# Patient Record
Sex: Female | Born: 1965 | Race: White | Hispanic: No | Marital: Married | Smoking: Never smoker
Health system: Southern US, Community
[De-identification: ages and names within clinical notes are randomized; demographics above are authoritative.]

## PROBLEM LIST (undated history)

## (undated) DIAGNOSIS — E559 Vitamin D deficiency, unspecified: Secondary | ICD-10-CM

## (undated) DIAGNOSIS — R0602 Shortness of breath: Secondary | ICD-10-CM

## (undated) DIAGNOSIS — N809 Endometriosis, unspecified: Secondary | ICD-10-CM

## (undated) DIAGNOSIS — R7989 Other specified abnormal findings of blood chemistry: Secondary | ICD-10-CM

## (undated) DIAGNOSIS — F329 Major depressive disorder, single episode, unspecified: Secondary | ICD-10-CM

## (undated) DIAGNOSIS — Z923 Personal history of irradiation: Secondary | ICD-10-CM

## (undated) DIAGNOSIS — F419 Anxiety disorder, unspecified: Secondary | ICD-10-CM

## (undated) DIAGNOSIS — F32A Depression, unspecified: Secondary | ICD-10-CM

## (undated) DIAGNOSIS — Z9221 Personal history of antineoplastic chemotherapy: Secondary | ICD-10-CM

## (undated) DIAGNOSIS — E785 Hyperlipidemia, unspecified: Secondary | ICD-10-CM

## (undated) DIAGNOSIS — Z8042 Family history of malignant neoplasm of prostate: Secondary | ICD-10-CM

## (undated) DIAGNOSIS — C50919 Malignant neoplasm of unspecified site of unspecified female breast: Secondary | ICD-10-CM

## (undated) DIAGNOSIS — Z803 Family history of malignant neoplasm of breast: Secondary | ICD-10-CM

## (undated) DIAGNOSIS — L900 Lichen sclerosus et atrophicus: Secondary | ICD-10-CM

## (undated) DIAGNOSIS — Z800 Family history of malignant neoplasm of digestive organs: Secondary | ICD-10-CM

## (undated) HISTORY — DX: Malignant neoplasm of unspecified site of unspecified female breast: C50.919

## (undated) HISTORY — DX: Vitamin D deficiency, unspecified: E55.9

## (undated) HISTORY — DX: Shortness of breath: R06.02

## (undated) HISTORY — DX: Lichen sclerosus et atrophicus: L90.0

## (undated) HISTORY — DX: Hyperlipidemia, unspecified: E78.5

## (undated) HISTORY — DX: Anxiety disorder, unspecified: F41.9

## (undated) HISTORY — DX: Depression, unspecified: F32.A

## (undated) HISTORY — DX: Family history of malignant neoplasm of breast: Z80.3

## (undated) HISTORY — DX: Major depressive disorder, single episode, unspecified: F32.9

## (undated) HISTORY — DX: Endometriosis, unspecified: N80.9

## (undated) HISTORY — DX: Other specified abnormal findings of blood chemistry: R79.89

## (undated) HISTORY — DX: Family history of malignant neoplasm of digestive organs: Z80.0

## (undated) HISTORY — DX: Family history of malignant neoplasm of prostate: Z80.42

---
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
## 1996-04-01 HISTORY — PX: OTHER SURGICAL HISTORY: SHX169

## 2002-04-01 HISTORY — PX: PELVIC LAPAROSCOPY: SHX162

## 2002-10-02 ENCOUNTER — Inpatient Hospital Stay (HOSPITAL_COMMUNITY): Admission: AD | Admit: 2002-10-02 | Discharge: 2002-10-02 | Payer: Self-pay | Admitting: Obstetrics and Gynecology

## 2002-12-08 ENCOUNTER — Other Ambulatory Visit: Admission: RE | Admit: 2002-12-08 | Discharge: 2002-12-08 | Payer: Self-pay | Admitting: Obstetrics and Gynecology

## 2003-04-02 HISTORY — PX: APPENDECTOMY: SHX54

## 2003-04-30 ENCOUNTER — Inpatient Hospital Stay (HOSPITAL_COMMUNITY): Admission: AD | Admit: 2003-04-30 | Discharge: 2003-05-02 | Payer: Self-pay | Admitting: Obstetrics and Gynecology

## 2003-05-03 ENCOUNTER — Encounter: Admission: RE | Admit: 2003-05-03 | Discharge: 2003-06-02 | Payer: Self-pay | Admitting: Obstetrics and Gynecology

## 2003-09-15 ENCOUNTER — Ambulatory Visit (HOSPITAL_COMMUNITY): Admission: RE | Admit: 2003-09-15 | Discharge: 2003-09-15 | Payer: Self-pay | Admitting: Family Medicine

## 2003-09-16 ENCOUNTER — Inpatient Hospital Stay (HOSPITAL_COMMUNITY): Admission: EM | Admit: 2003-09-16 | Discharge: 2003-09-18 | Payer: Self-pay | Admitting: Emergency Medicine

## 2003-09-16 ENCOUNTER — Encounter (INDEPENDENT_AMBULATORY_CARE_PROVIDER_SITE_OTHER): Payer: Self-pay | Admitting: Specialist

## 2003-12-28 ENCOUNTER — Other Ambulatory Visit: Admission: RE | Admit: 2003-12-28 | Discharge: 2003-12-28 | Payer: Self-pay | Admitting: Obstetrics and Gynecology

## 2004-05-18 ENCOUNTER — Other Ambulatory Visit: Admission: RE | Admit: 2004-05-18 | Discharge: 2004-05-18 | Payer: Self-pay | Admitting: Obstetrics and Gynecology

## 2005-06-05 ENCOUNTER — Other Ambulatory Visit: Admission: RE | Admit: 2005-06-05 | Discharge: 2005-06-05 | Payer: Self-pay | Admitting: Obstetrics and Gynecology

## 2006-01-20 ENCOUNTER — Inpatient Hospital Stay (HOSPITAL_COMMUNITY): Admission: AD | Admit: 2006-01-20 | Discharge: 2006-01-23 | Payer: Self-pay | Admitting: Obstetrics and Gynecology

## 2006-07-24 ENCOUNTER — Ambulatory Visit: Payer: Self-pay | Admitting: Cardiology

## 2010-03-20 ENCOUNTER — Encounter
Admission: RE | Admit: 2010-03-20 | Discharge: 2010-03-20 | Payer: Self-pay | Source: Home / Self Care | Attending: Obstetrics and Gynecology | Admitting: Obstetrics and Gynecology

## 2010-08-17 NOTE — Discharge Summary (Signed)
[name], [name]                ACCOUNT NO.:  [id number]   MEDICAL RECORD NO.:  [id number]          PATIENT TYPE:  INP   LOCATION:                                FAC[facility]  [facility]   PHYSICIAN:  Sandra [name], M.D.        DATE OF BIRTH:   DATE OF ADMISSION:  01/20/2006  DATE OF DISCHARGE:  01/23/2006                               DISCHARGE SUMMARY   FINAL DIAGNOSES:  1. Intrauterine pregnancy at [redacted] weeks gestation.  2. History of prior minor shoulder dystocia and suspected fetal      macrosomia.   PROCEDURE:  Primary low segment transverse cesarean section.   SURGEON:  Sandra [name], M.D.   ASSISTANT:  Freddrick March. Tenny Craw, MD.   COMPLICATIONS:  None.   HOSPITAL COURSE:  This 45 year old G2 P1 presents for a primary cesarean  section secondary to a history of a minor shoulder dystocia and a  ultrasound which showed a suspected macrosomic infant.  The patient's  antepartum course up to this point had been uncomplicated.  The patient  did have an abnormal 1-hour sugar test but passed her 3-hour sugar test.  The patient also is advanced maternal age but declined any type of fetal  screening.  The patient did have a negative group B strep culture  obtained in the office at 35 weeks.  The patient was taken to the  operating room on January 20, 2006, where a primary low segment  transverse cesarean section was performed with the delivery of an 8  pound 15 ounce female infant with Apgars of 8 and 9.  The delivery went  without complications.   The patient's postoperative course was benign without any significant  fevers.  She was felt ready for discharge on postoperative day #3.  She  did not need any RhoGAM before discharge.  Her husband is also Rh  negative.  She was sent home on a regular diet, told to decrease  activities, told to continue her prenatal vitamins, was given Percocet  one to two every 4 hours as needed for pain, told she could use over-the-  counter ibuprofen up to 600  mg every 6 hours as needed for pain, was to  follow up in our office in 4 weeks.   LABS ON DISCHARGE:  The patient had a hemoglobin of 10.9, white blood  cell count of 10.6 and platelets of 174,000.     Sandra [name], P.A.-C.      Sandra [name], M.D.  Electronically Signed   MB/MEDQ  D:  03/10/2006  T:  03/10/2006  Job:  [id number]

## 2010-08-17 NOTE — Op Note (Signed)
Sandra [name], Sandra [name]                          ACCOUNT NO.:  [id number]   MEDICAL RECORD NO.:  [id number]                   PATIENT TYPE:  INP   LOCATION:  5707                                 FACILITY:  [facility]   PHYSICIAN:  Adolph Pollack, M.D.            DATE OF BIRTH:  Aug 22, 1965   DATE OF PROCEDURE:  09/16/2003  DATE OF DISCHARGE:                                 OPERATIVE REPORT   PREOPERATIVE DIAGNOSIS:  Acute appendicitis.   POSTOPERATIVE DIAGNOSIS:  Ruptured appendicitis.   PROCEDURE:  Laparoscopic appendectomy.   SURGEON:  Adolph Pollack, M.D.   ANESTHESIA:  General.   INDICATIONS FOR PROCEDURE:  This is a 45 year old female who presented to  the emergency department from her primary care physician's office.  She had  a CT scan which was consistent with appendicitis.  Her clinical course was  consistent, as well.  She now presents to the operating room.   SURGICAL TECHNIQUE:  She was placed supine on the operating table and a  general anesthetic was administered.  PAS hose were placed and a Foley  catheter was inserted to the bladder.  The abdominal wall was sterilely  prepped and draped.  Local anesthetic was infiltrated in the subumbilical  region and the previous subumbilical incision was incised and the incision  carried down through the subcutaneous tissue.  A small incision was made in  the fascia and peritoneum.  The peritoneal cavity was entered under direct  vision.  A purse-string suture of 0 Vicryl was placed around the fascial  edges.  A Hasson trocar was introduced into the peritoneal cavity and a  pneumoperitoneum was created by insufflation of CO2 gas.  The laparoscope  was introduced.  A 5 mm trocar was placed in the lower midline in the  suprapubic region and one also in the right upper quadrant.  An inflamed  appendix was identified with a focal site of perforation and some purulent  fluid in the pelvis.  I mobilized the appendix bluntly and  then was able to  retract it anteriorly.  There was a small perforation noted in the mid  portion of the appendix.  The mesoappendix was divided with the Harmonic  scalpel down to the base of the cecum.  The appendix was then amputated off  the cecum with the endo-GIA stapler.  It was placed in the endopouch bag and  removed through the subumbilical port.  The Hasson trocar was placed through  the subumbilical incision.  3 liters of saline were used to copiously  irrigate out the abdominal cavity.  The staple line was solid without  evidence of leak or bleeding.  The majority of the irrigation fluid was  evacuated.  Under direct vision, the suprapubic 5 mm trocar was removed and  the Hasson trocar removed and the fascial defect closed by tightening up and  tightening down the purse-string suture.  The remaining 5  mm trocars were  removed along with the release of the pneumoperitoneum.  The skin incisions  were closed with 4-0 Monocryl subcuticular stitch.  Steri-Strips and sterile  dressings were applied.  She tolerated the procedure well without any  apparent complication.  We will keep her on IV antibiotics and she could  have a prolonged ileus given the focal perforation.  She is taken to the  recovery room in satisfactory condition.                                               Adolph Pollack, M.D.    [name]  D:  09/16/2003  T:  09/16/2003  Job:  [id number]

## 2010-08-17 NOTE — Op Note (Signed)
[name], [name]                ACCOUNT NO.:  [id number]   MEDICAL RECORD NO.:  [id number]          PATIENT TYPE:  INP   LOCATION:  9113                          FACILITY:  [facility]   PHYSICIAN:  Randye Lobo, M.D.   DATE OF BIRTH:  Nov 25, 1965   DATE OF PROCEDURE:  01/20/2006  DATE OF DISCHARGE:                                 OPERATIVE REPORT   PREOPERATIVE DIAGNOSES:  1. Intrauterine gestation at 39 weeks.  2. Suspected fetal macrosomia.  3. History of minor shoulder dystocia.   POSTOPERATIVE DIAGNOSES:  1. Intrauterine gestation at 39 weeks.  2. Suspected fetal macrosomia.  3. History of prior minor shoulder dystocia.   PROCEDURE:  Primary low segment transverse cesarean section.   SURGEON:  Conley Simmonds, MD   ASSISTANT:  Waynard Reeds, MD   ANESTHESIA:  Spinal.   IV FLUIDS:  5000 mL Ringer's lactate.   ESTIMATED BLOOD LOSS:  1000 mL   URINE OUTPUT:  300 mL   COMPLICATIONS:  None.   INDICATIONS FOR PROCEDURE:  The patient is a 40-year gravida 2, para 1  Caucasian female with a history of prior vaginal delivery with a minor  shoulder dystocia with a fetal weight of 9 pounds 4 ounces, who on recent  ultrasound was noted to have in an estimated fetal weight at term which  surpassed the 9 pounds 4 ounces mark.  The patient had an elevated 1-hour  glucose tolerance test during the pregnancy and a normal 3 hours on glucose  tolerance test.  The patient was counseled regarding her options and a plan  was made to proceed with a primary cesarean section after risks, benefits,  and alternatives were reviewed.   FINDINGS:  A viable female was delivered at 9:31 a.m. with a Apgars of the 8  and one minute and 9 at five minutes.  The newborn weight was 8 pounds 15  ounces.  The uterus, tubes and ovaries were unremarkable.  The placenta did  have a marginal insertion of a three-vessel cord.   SPECIMENS:  None.   PROCEDURE:  The patient was reidentified in the preoperative hold  area.  The  patient was taken to the operating room where spinal anesthetic was  administered.  The patient was then placed in the supine position with a  left lateral tilt.  The abdomen was sterilely prepped and a Foley catheter  was placed inside the bladder.  The patient was then sterilely draped.   Pfannenstiel incision was created sharply with the scalpel and this was  carried down to the fascia using the scalpel.  The monopolar cautery was  then used to create hemostasis in the cutaneous layer.  The fascial incision  was extended bilaterally with the Mayo scissors.  Rectus muscles were  separated from the fascia superiorly and inferiorly.  Rectus muscles were  sharply divided in the midline.  The parietal peritoneum was elevated with  two hemostat clamps and was entered sharply.  The peritoneal incision was  extended cranially and caudally.   The lower uterine segment was exposed and the bladder flap was  created by  sharp dissection.  The bladder itself was noted to be quite low and the area  in this region was not very vascular.  The transverse lower uterine segment  incision was created sharply with the scalpel and was created well above the  vascular area of the lower uterine segment.  The uterine cavity was  ultimately entered bluntly with a Kelly clamp.  The uterine incision was  extended bluntly.  Membranes were ruptured with an Allis.  The vertex was  elevated to the uterine incision and the Mityvac was used with proper  pressure over one effort for delivery of the vertex without difficulty.  The  nares and mouth were suctioned.  The remainder of the newborn was then  delivered.  The cord was doubly clamped and cut and the newborn was carried  over to the waiting pediatricians in vigorous condition.   Cord blood was obtained at this time and cord blood was also donated.  The  placenta was then manually extracted and was sent to labor and delivery.   The uterus was  exteriorized at this time.  A moistened lap pad was used to  remove any remaining products of conception from within the uterine cavity.  The patient received Ancef 1 gram IV at cord clamp and then she received  Pitocin 20 units IV after removal of the placenta.  The uterine incision was  closed at this time with a double layer closure of #1 chromic.  The first  was a running locked layer and the second was an imbricating layer.  There  was some additional bleeding along the superior midportion of the incision  and this responded to a figure-of-eight suture of 3-0 Vicryl.   The uterus was returned to the peritoneal cavity which was irrigated and  suctioned at this time.  There was some additional bleeding of the serosa  along the outright apex of the incision and this responded to a figure-of-  eight suture of 3-0 Vicryl.   The abdomen was closed at this time.  The peritoneum was closed with a  running suture of 2-0 Vicryl.  The rectus muscles were reapproximated with  interrupted sutures of #1 chromic.  The fascia was closed with a running  suture of 0 Vicryl.  The subcutaneous tissue was irrigated and suctioned and  made hemostatic with monopolar cautery.  Interrupted sutures of 3-0 plain  were used to close the  subcutaneous layer and the skin was closed with  staples.  A sterile bandage was placed over the incision.   This concluded the patient's procedure.  There were no complications.  All  needle, instrument, and counts were correct.      Randye Lobo, M.D.  Electronically Signed    BES/MEDQ  D:  01/21/2006  T:  01/22/2006  Job:  [id number]

## 2010-08-17 NOTE — H&P (Signed)
NAMELUCAS, Sandra [name]                          ACCOUNT NO.:  [id number]   MEDICAL RECORD NO.:  [id number]                   PATIENT TYPE:  INP   LOCATION:  5707                                 FACILITY:  [facility]   PHYSICIAN:  Adolph Pollack, M.D.            DATE OF BIRTH:  Jan 10, 1966   DATE OF ADMISSION:  09/16/2003  DATE OF DISCHARGE:                                HISTORY & PHYSICAL   CHIEF COMPLAINT:  Abdominal pain.   HISTORY OF PRESENT ILLNESS:  This is a 45 year old female who, on Wednesday,  awoke and felt poorly.  She has had some abdominal bloating, some nausea.  On Wednesday night, she began having progressively increasing abdominal pain  along with the nausea.  She then developed some intermittent fever, was seen  by her primary care physician and reevaluated Thursday afternoon, where the  pain then migrated to the right lower quadrant.  She was sent to [facility], where she underwent a CT scan as an outpatient and this  demonstrated acute appendicitis.  She was then brought to the emergency  department and I was asked to see her.  She had no urinary symptoms.  The  pain is increasing in severity.   PAST MEDICAL HISTORY:  No chronic illnesses.   PREVIOUS OPERATIONS:  Laparoscopy.   ALLERGIES:  None.   MEDICATIONS:  Prenatal vitamins.   SOCIAL HISTORY:  She is married.  No tobacco or alcohol use.  She has a baby  daughter.   FAMILY HISTORY:  The family history is positive for hypertension.   REVIEW OF SYSTEMS:  No known heart disease or hypertension.  PULMONARY:  No  asthma, pneumonia, COPD, tuberculosis.  GI:  No peptic ulcer disease,  hepatitis, colitis.  GU:  No kidney stones, urinary tract infections.  ENDOCRINE:  No diabetes, thyroid disease or hypercholesterolemia.  NEUROLOGIC:  No strokes or seizures.  HEMATOLOGIC:  No bleeding disorders or  DVT.   PHYSICAL EXAM:  GENERAL:  Generally, an ill-appearing female and  uncomfortable.  VITAL SIGNS:   Temperature was 98.7 on arrival, pulse 95, blood pressure  107/68.  EYES:  Extraocular motions intact.  No icterus noted.  NECK:  Neck supple without masses or palpable thyroid enlargement.  RESPIRATORY:  Breath sounds equal and clear.  Respirations unlabored.  CARDIOVASCULAR:  Cardiovascular demonstrates an increased rate with a  regular rhythm and no murmur heard.  ABDOMEN:  Her abdomen is soft with right lower quadrant tenderness and  guarding to both palpation and percussion.  No palpable masses.  Small  subumbilical scar.  Hypoactive bowel sounds noted.  EXTREMITIES:  Good muscle tone, full range of motion.   LABORATORY DATA:  White cell count 17,300.  Urine pregnancy test negative.   CT scan reviewed and was consistent with acute appendicitis.   IMPRESSION:  Acute appendicitis.   PLAN:  Laparoscopic appendectomy.  I did explain the procedure and  the risks  to her.  The risks include, but not limited to, bleeding, infection, risks  of anesthesia, accidental damage to intra-abdominal organs such as bladder,  intestines, etc, and potential prolonged postoperative course if there were  a ruptured appendix.                                                Adolph Pollack, M.D.    [name]  D:  09/16/2003  T:  09/17/2003  Job:  [id number]

## 2010-08-17 NOTE — Op Note (Signed)
Sandra [name], Sandra [name]                          ACCOUNT NO.:  [id number]   MEDICAL RECORD NO.:  [id number]                   PATIENT TYPE:  INP   LOCATION:  9165                                 FACILITY:  [facility]   PHYSICIAN:  Randye Lobo, M.D.                DATE OF BIRTH:  Feb 10, 1966   DATE OF PROCEDURE:  04/30/2003  DATE OF DISCHARGE:                                 OPERATIVE REPORT   PREOPERATIVE DIAGNOSES:  1. Intrauterine gestation at 40 +6 weeks.  2. Maternal exhaustion.   POSTOPERATIVE DIAGNOSES:  1. Intrauterine gestation at 40 +6 weeks.  2. Maternal exhaustion.  3. Mild shoulder dystocia.   PROCEDURE:  Vacuum-assisted vaginal delivery with midline episiotomy and  repair.   SURGEON:  Randye Lobo, M.D.   ANESTHESIA:  Epidural, local with 1% lidocaine.   ESTIMATED BLOOD LOSS:  400 mL.   COMPLICATIONS:  None.   INDICATIONS FOR THE PROCEDURE:  The patient is a 45 year old gravida 1, para  0, Caucasian female who presented at 81 +[redacted] weeks gestation on 04/30/03,  reporting contractions every 5 minutes since 23:00 on 04/29/03.  The patient  had been seen in the office on 04/28/03 and had a reactive nonstress test, at  which time her cervix was noted to be 1 cm dilated and 50% effaced.  Upon  admission to the [facility], the patient's cervix was 4-5 cm dilated,  90% effaced, with a vertex at -2 station.  Membranes were intact.  The fetal  heart rate tracing was noted to be reactive, and the patient was having  regular contractions.  The patient went on to receive an epidural for  anesthesia.  Artificial rupture of membranes was performed, and clear fluid  was noted.  The patient's contractions began to become less frequent, and  Pitocin augmentation was performed.  The patient went onto to complete  cervical dilation and began pushing.  After pushing for 1-1/2 hours, the  patient developed exhaustion, and there was edema of the vagina and the  hymen, with a  spontaneous hymenal laceration which developed.  At this time,  a recommendation was made to proceed with a vacuum-assisted vaginal delivery  with episiotomy by the obstetrician.  The risks and benefits were reviewed.   FINDINGS:  A viable female was delivered at 13:10 in the left occiput-  anterior position.  Apgars were 8 at 1 minute and 9 at 5 minutes.  The  newborn was noted to be vigorous.  The umbilical cord had a normal  configuration and insertion of a three-vessel cord.  Examination of the  vagina documented a third-degree extension of the midline episiotomy.  The  cervix demonstrated no lacerations, and there were right and left labial  separations, the left of which extended into the hymen and the vagina.  The  newborn was noted to have a temperature of 101.8 rectally and then 101.6  axillary.  Maternal temperature just prior to delivery was noted to be 99.7.   PROCEDURE:  The patient was examined and found to bring the vertex down to  the 3+ station in the left occiput-anterior position.  The patient was  sterilely prepped and draped, and the bladder was catheterized of urine.  A  local 1% lidocaine was injected, and a midline episiotomy was performed.  The Mighty Vac was applied to the vertex and was used over one contraction,  at which time the fetal vertex was delivered.  There was a very mild  shoulder dystocia, which was encountered, which lasted for a few seconds and  was relieved easily with McRobert's maneuver.  The newborn was noted to be  vigorous and was placed on the maternal abdomen and chest.  There was  spontaneous delivery of the placenta.  The vagina, cervix, and vulva were  examined, and the findings are as noted above.  The midline episiotomy was  repaired in standard fashion with a combination of 0 and 2-0 Vicryl.  The  left labial abrasion was repaired with interrupted sutures of 3-0 Vicryl.   The patient did receive Pitocin 20 units intravenously.    There were no complications to the procedure.  Needle, instrument, and  sponge counts were correct.                                               Randye Lobo, M.D.    BES/MEDQ  D:  04/30/2003  T:  04/30/2003  Job:  [id number]

## 2010-08-17 NOTE — Assessment & Plan Note (Signed)
[facility]                            CARDIOLOGY OFFICE NOTE   NAME:Sandra [name]                       MRN:          [id number]  DATE:07/24/2006                            DOB:          1966/03/13    [name] is a delightful 45 year old married white female,  mother of two girls, who comes in today for consultation for  cardiovascular risk assessment.  She has risk factors of hyperlipidemia  with a total cholesterol of 206, LDL of 142.  She does not have the  value of her HDL triglycerides.  She has a premature family history of  stroke with her mother having a stroke at age 53 and subsequently dying  at age 24 of a heart attack; however, her mother did smoke.  We do not  know any of the risk factors of her mother.   [name] is healthy otherwise.  She has not smoked, does not drink,  does not use any recreational drugs.  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   PAST MEDICAL HISTORY:  She has no history of diabetes or hypertension.   CURRENT MEDICATIONS:  Mirena IUD.   PAST SURGICAL HISTORY:  1. Lower mandible surgery in June, 1985.  2. Appendectomy in June, 2005.  3. C-section in October, 2007.  4. IUD in December, 2007.   FAMILY HISTORY:  As above.   SOCIAL HISTORY:  She is a Fut[employer] trader.   REVIEW OF SYSTEMS:  Other than the HPI, negative for all systems  reviewed.   PHYSICAL EXAMINATION:  GENERAL:  Very pleasant.  She is in no acute  distress.  VITAL SIGNS:  Blood pressure 125/76.  Pulse is 81 and regular.  Her  weight is 205.  She is 5 feet 10-1/2.  HEENT:  Normocephalic and atraumatic.  PERRLA.  Extraocular movements  are intact.  She wears glasses.  Sclerae are clear.  Facial symmetry is  normal.  Dentition is satisfactory.  NECK:  Supple without JVD, thyromegaly.  Carotid upstrokes are equal  bilaterally without bruits.  LUNGS:  Clear to auscultation.  HEART:  A  nondisplaced PMI.  She has normal S1 and S2 without murmur,  rub or gallop.  ABDOMEN:  Soft with good bowel sounds.  No midline bruit.  EXTREMITIES:  No clubbing, cyanosis or edema.  Pulses are intact.   Her electrocardiogram is completely normal.   ASSESSMENT/PLAN:  I have had about a 20 minute discussion with Ms.  Sandra [name].  Because of her young age, her cardiovascular risks are very  low over the next 10 years of having an acute vascular event; however,  as I point out to her today, as she gets older, this risk will  precipitate fairly quickly.  I have strongly recommended the following:  1. Try to decrease her weight and certainly gain no more.  2. Vigorous walking or some sort of cardiac activity three hours per      week.  3. Much healthier diet.  She readily admits to being a  junk food      junkie.  We talked about this at length.  4. No indication for pharmacological therapy of her hyperlipidemia at      present.     Thomas C. Daleen Squibb, MD, [facility]  Electronically Signed    TCW/MedQ  DD: 07/24/2006  DT: 07/24/2006  Job #: [id number]   cc:   Randye Lobo, M.D.

## 2010-08-21 ENCOUNTER — Other Ambulatory Visit: Payer: Self-pay | Admitting: Obstetrics and Gynecology

## 2010-08-21 DIAGNOSIS — Z1231 Encounter for screening mammogram for malignant neoplasm of breast: Secondary | ICD-10-CM

## 2010-09-24 ENCOUNTER — Ambulatory Visit
Admission: RE | Admit: 2010-09-24 | Discharge: 2010-09-24 | Disposition: A | Discharge status: Home / Self Care | Payer: 59 | Source: Ambulatory Visit | Attending: Obstetrics and Gynecology | Admitting: Obstetrics and Gynecology

## 2010-09-24 DIAGNOSIS — Z1231 Encounter for screening mammogram for malignant neoplasm of breast: Secondary | ICD-10-CM

## 2010-11-29 ENCOUNTER — Other Ambulatory Visit: Payer: Self-pay | Admitting: Gastroenterology

## 2011-08-27 ENCOUNTER — Other Ambulatory Visit: Payer: Self-pay | Admitting: Family Medicine

## 2011-08-27 DIAGNOSIS — Z1231 Encounter for screening mammogram for malignant neoplasm of breast: Secondary | ICD-10-CM

## 2011-09-25 ENCOUNTER — Ambulatory Visit
Admission: RE | Admit: 2011-09-25 | Discharge: 2011-09-25 | Disposition: A | Discharge status: Home / Self Care | Payer: 59 | Source: Ambulatory Visit | Attending: Family Medicine | Admitting: Family Medicine

## 2011-09-25 DIAGNOSIS — Z1231 Encounter for screening mammogram for malignant neoplasm of breast: Secondary | ICD-10-CM

## 2011-10-24 ENCOUNTER — Other Ambulatory Visit (HOSPITAL_COMMUNITY)
Admission: RE | Admit: 2011-10-24 | Discharge: 2011-10-24 | Disposition: A | Discharge status: Home / Self Care | Payer: 59 | Source: Ambulatory Visit | Attending: Family Medicine | Admitting: Family Medicine

## 2011-10-24 ENCOUNTER — Other Ambulatory Visit: Payer: Self-pay | Admitting: Family Medicine

## 2011-10-24 DIAGNOSIS — Z1151 Encounter for screening for human papillomavirus (HPV): Secondary | ICD-10-CM | POA: Insufficient documentation

## 2011-10-24 DIAGNOSIS — Z124 Encounter for screening for malignant neoplasm of cervix: Secondary | ICD-10-CM | POA: Insufficient documentation

## 2012-09-11 ENCOUNTER — Other Ambulatory Visit: Payer: Self-pay

## 2012-09-11 DIAGNOSIS — Z1231 Encounter for screening mammogram for malignant neoplasm of breast: Secondary | ICD-10-CM

## 2012-10-12 ENCOUNTER — Ambulatory Visit
Admission: RE | Admit: 2012-10-12 | Discharge: 2012-10-12 | Disposition: A | Discharge status: Home / Self Care | Payer: 59 | Source: Ambulatory Visit

## 2012-10-12 DIAGNOSIS — Z1231 Encounter for screening mammogram for malignant neoplasm of breast: Secondary | ICD-10-CM

## 2012-10-15 ENCOUNTER — Ambulatory Visit: Payer: 59

## 2012-10-26 ENCOUNTER — Encounter: Payer: Self-pay | Admitting: Obstetrics and Gynecology

## 2012-10-26 ENCOUNTER — Ambulatory Visit (INDEPENDENT_AMBULATORY_CARE_PROVIDER_SITE_OTHER): Payer: 59 | Admitting: Obstetrics and Gynecology

## 2012-10-26 VITALS — BP 100/84 | HR 82 | Ht 71.0 in | Wt 207.0 lb

## 2012-10-26 DIAGNOSIS — Z01419 Encounter for gynecological examination (general) (routine) without abnormal findings: Secondary | ICD-10-CM

## 2012-10-26 DIAGNOSIS — E785 Hyperlipidemia, unspecified: Secondary | ICD-10-CM

## 2012-10-26 DIAGNOSIS — R319 Hematuria, unspecified: Secondary | ICD-10-CM

## 2012-10-26 DIAGNOSIS — Z0000 Encounter for general adult medical examination without abnormal findings: Secondary | ICD-10-CM

## 2012-10-26 DIAGNOSIS — R1032 Left lower quadrant pain: Secondary | ICD-10-CM

## 2012-10-26 DIAGNOSIS — Z1211 Encounter for screening for malignant neoplasm of colon: Secondary | ICD-10-CM

## 2012-10-26 LAB — LIPID PANEL
Cholesterol: 220 mg/dL — ABNORMAL HIGH (ref 0–200)
HDL: 47 mg/dL (ref >39)
LDL Cholesterol: 148 mg/dL — ABNORMAL HIGH (ref 0–99)
Total CHOL/HDL Ratio: 4.7 Ratio
Triglycerides: 125 mg/dL (ref <150)
VLDL: 25 mg/dL (ref 0–40)

## 2012-10-26 LAB — COMPREHENSIVE METABOLIC PANEL
ALT: 9 U/L (ref 0–35)
AST: 11 U/L (ref 0–37)
Albumin: 4.3 g/dL (ref 3.5–5.2)
Alkaline Phosphatase: 40 U/L (ref 39–117)
BUN: 10 mg/dL (ref 6–23)
CO2: 25 mEq/L (ref 19–32)
Calcium: 9.1 mg/dL (ref 8.4–10.5)
Chloride: 102 mEq/L (ref 96–112)
Creat: 0.84 mg/dL (ref 0.50–1.10)
Glucose, Bld: 87 mg/dL (ref 70–99)
Potassium: 4 mEq/L (ref 3.5–5.3)
Sodium: 136 mEq/L (ref 135–145)
Total Bilirubin: 0.6 mg/dL (ref 0.3–1.2)
Total Protein: 7.1 g/dL (ref 6.0–8.3)

## 2012-10-26 LAB — POCT URINALYSIS DIPSTICK
Bilirubin, UA: NEGATIVE
Glucose, UA: NEGATIVE
Ketones, UA: NEGATIVE
Leukocytes, UA: NEGATIVE
Nitrite, UA: NEGATIVE
Protein, UA: NEGATIVE
Urobilinogen, UA: NEGATIVE
pH, UA: 5

## 2012-10-26 LAB — CBC
HCT: 41.4 % (ref 36.0–46.0)
Hemoglobin: 14.2 g/dL (ref 12.0–15.0)
MCH: 30.4 pg (ref 26.0–34.0)
MCHC: 34.3 g/dL (ref 30.0–36.0)
MCV: 88.7 fL (ref 78.0–100.0)
Platelets: 240 10*3/uL (ref 150–400)
RBC: 4.67 MIL/uL (ref 3.87–5.11)
RDW: 13.9 % (ref 11.5–15.5)
WBC: 5.2 10*3/uL (ref 4.0–10.5)

## 2012-10-26 MED ORDER — CLOBETASOL PROPIONATE 0.05 % EX CREA: 1.0000 "application " | TOPICAL_CREAM | CUTANEOUS | Status: DC

## 2012-10-26 NOTE — Progress Notes (Signed)
Patient ID: Sandra [name], female   DOB: [birth year], 47 y.o.   MRN: [id number] 47 y.o.   Married    Caucasian   female   G2P2002   here for annual exam.   Some pain in lower left side, blunt, can be constant at times but comes and goes.  Had a urine test last week with material floating in it.  Had some female dyspareunia, but this resolved.  Has some red discharge with wiping.  Last time had bleeding was July 10 and lasted 12 days.   Happy overall with the IUD.    Can have a flare of lichen sclerosis.  Used Clobetasol once a week or as needed.  Needs refill.  Wants labs.  History of mildly elevated LDL cholesterol.  Patient's last menstrual period was 10/08/2012.          Sexually active: yes  The current method of family planning is IUD-Mirena   Placed February 2013.  Had a difficult insertion.  No follow up ultrasound.  Exercising: no Last mammogram:  3 weeks ago: [facility] - normal Last pap smear: 09/2011 wnl History of abnormal pap: no Smoking:no Alcohol: no Last colonoscopy: 2 years ago - uncertain result.  Maybe had polyps.  Due for a recheck in 5 years.  Dr. Sharon Mt. Last Bone Density:  no Last tetanus shot:2 years ago Last cholesterol check: 1 year ago: wnl  Urine: Mod. RBC's    Family History  Problem Relation Age of Onset  . Stroke Mother   . Heart disease Mother     deceased  . Colon cancer Father   . Hypertension Brother     There are no active problems to display for this patient.   Past Medical History  Diagnosis Date  . Lichen sclerosus     vulva  . Endometriosis     Past Surgical History  Procedure Laterality Date  . Mandible reconstruction  1985  . Shinn surgery  1988  . Pelvic laparoscopy  2004    endometriosis  . Appendectomy  2005  . Cesarean section  2002    Allergies: Review of patient's allergies indicates no known allergies.  Current Outpatient Prescriptions  Medication Sig Dispense Refill  . clobetasol cream (TEMOVATE) 0.05  % Apply 1 application topically once a week.      Marland Kitchen levonorgestrel (MIRENA) 20 MCG/24HR IUD 1 each by Intrauterine route once.       No current facility-administered medications for this visit.    ROS: Pertinent items are noted in HPI.  Social Hx:  Married.  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  Exam:    BP 100/84  Pulse 82  Ht 5\' 11"  (1.803 m)  Wt 207 lb (93.895 kg)  BMI 28.88 kg/m2  LMP 10/08/2012   Wt Readings from Last 3 Encounters:  10/26/12 207 lb (93.895 kg)     Ht Readings from Last 3 Encounters:  10/26/12 5\' 11"  (1.803 m)    General appearance: alert, cooperative and appears stated age Head: Normocephalic, without obvious abnormality, atraumatic Neck: no adenopathy, supple, symmetrical, trachea midline and thyroid not enlarged, symmetric, no tenderness/mass/nodules Lungs: clear to auscultation bilaterally Breasts: Inspection negative, No nipple retraction or dimpling, No nipple discharge or bleeding, No axillary or supraclavicular adenopathy, Normal to palpation without dominant masses Heart: regular rate and rhythm Abdomen: soft, non-tender; bowel sounds normal; no masses,  no organomegaly Extremities: extremities normal, atraumatic, no cyanosis or edema Skin: Skin color, texture, turgor  normal. No rashes or lesions Lymph nodes: Cervical, supraclavicular, and axillary nodes normal. No abnormal inguinal nodes palpated Neurologic: Grossly normal   Pelvic: External genitalia:  no lesions              Urethra:  normal appearing urethra with no masses, tenderness or lesions              Bartholins and Skenes: normal                 Vagina: normal appearing vagina with normal color and discharge, no lesions              Cervix: normal appearance.  IUD strings not seen.              Pap taken: no        Bimanual Exam:  Uterus:  uterus is normal size, shape, consistency and nontender                                      Adnexa: normal adnexa in size,  nontender and no masses                                      Rectovaginal: Confirms                                      Anus:  normal sphincter tone, no lesions  Assessment LLQ pain. Mirena IUD patient.  Cannot see strings. Hematuria Lichen sclerosis. Family history of colon cancer.  Patient with a history of polyps? Mild hyperlipidemia.  Plan Mammogram yearly. pap smear in 2018 Labs - lipid profile, CBC, CMP. Urine culture. IFOB to patient.  Refill of Clobetasol. See EPIC orders. Return for pelvic ultrasound. Recheck urine when patient comes in for ultrasound. return annually or prn     An After Visit Summary was printed and given to the patient.

## 2012-10-26 NOTE — Addendum Note (Signed)
Addended by: Alphonsa Overall on: 10/26/2012 09:38 AM   Modules accepted: Orders

## 2012-10-26 NOTE — Patient Instructions (Signed)

## 2012-10-27 LAB — URINE CULTURE
Colony Count: NO GROWTH
Organism ID, Bacteria: NO GROWTH

## 2012-10-27 LAB — HEMOGLOBIN, FINGERSTICK: Hemoglobin, fingerstick: 14.3 g/dL (ref 12.0–16.0)

## 2012-10-28 ENCOUNTER — Other Ambulatory Visit: Payer: Self-pay | Admitting: Obstetrics and Gynecology

## 2012-10-28 DIAGNOSIS — E785 Hyperlipidemia, unspecified: Secondary | ICD-10-CM

## 2012-11-02 ENCOUNTER — Telehealth: Payer: Self-pay | Admitting: Obstetrics and Gynecology

## 2012-11-02 NOTE — Telephone Encounter (Signed)
LMTCB to discuss ins benefits and schedule pus.  °

## 2012-11-13 NOTE — Telephone Encounter (Signed)
LMTCB on home number and cell.

## 2012-11-19 NOTE — Telephone Encounter (Signed)
LMTCB to discuss ins benefits and schedule PUS. (3rd message)

## 2012-11-20 NOTE — Telephone Encounter (Signed)
LMTCB to schedule PUS.  °

## 2012-11-20 NOTE — Telephone Encounter (Signed)
Patient is ready to schedule PUS /nr

## 2012-12-07 LAB — FECAL OCCULT BLOOD, IMMUNOCHEMICAL: Fecal Occult Blood: NEGATIVE

## 2012-12-07 NOTE — Addendum Note (Signed)
Addended by: Alphonsa Overall on: 12/07/2012 08:04 AM   Modules accepted: Orders

## 2012-12-10 ENCOUNTER — Ambulatory Visit (INDEPENDENT_AMBULATORY_CARE_PROVIDER_SITE_OTHER): Payer: 59 | Admitting: Obstetrics and Gynecology

## 2012-12-10 ENCOUNTER — Encounter: Payer: Self-pay | Admitting: Obstetrics and Gynecology

## 2012-12-10 ENCOUNTER — Ambulatory Visit (INDEPENDENT_AMBULATORY_CARE_PROVIDER_SITE_OTHER): Payer: 59

## 2012-12-10 VITALS — BP 120/78 | HR 84 | Ht 71.0 in | Wt 203.5 lb

## 2012-12-10 DIAGNOSIS — N83209 Unspecified ovarian cyst, unspecified side: Secondary | ICD-10-CM

## 2012-12-10 DIAGNOSIS — R1032 Left lower quadrant pain: Secondary | ICD-10-CM

## 2012-12-10 DIAGNOSIS — R3129 Other microscopic hematuria: Secondary | ICD-10-CM

## 2012-12-10 DIAGNOSIS — N83202 Unspecified ovarian cyst, left side: Secondary | ICD-10-CM

## 2012-12-10 NOTE — Addendum Note (Signed)
Addended by: Alphonsa Overall on: 12/10/2012 01:21 PM   Modules accepted: Orders

## 2012-12-10 NOTE — Progress Notes (Signed)
Subjective  Patient is here for ultrasound exam for LLQ pain.  Hurts with coughing.  Has a Mirena IUD and strings not seen on exam. Has pain during the first three days of the cycle.  Pain does not correlate with the insertion of the last IUD.  Pain is dull and consistent. With cough with this last cycle, had to sit down due to pain.   Has a history of endometriosis.  Due for repeat blood work for cholesterol check in October.  Gave urine specimen for hematuria recheck.  Objective  See ultrasound below - IUD in canal, normal endometrium and uterus, 2.8 cm simple left ovarian cyst.      Assessment  Simple left ovarian cyst. LLQ pain with menses. History of endometriosis. Mirena IUD patient. Microscopic hematuria. Hyperlipidemia.  Plan  Observation of dysmenorrhea. Will repeat pelvic ultrasound in about 2 months to recheck ovary. Consider low dose OCPs if pain persists.  (Would even consider leaving IUD in and doing a brief  trial of LoLoestrin OCPs.) Will send urine micro and culture. Do fasting labs in 2 months.

## 2012-12-10 NOTE — Patient Instructions (Signed)
Ovarian Cyst  The ovaries are small organs that are on each side of the uterus. The ovaries are the organs that produce the female hormones, estrogen and progesterone. An ovarian cyst is a sac filled with fluid that can vary in its size. It is normal for a small cyst to form in women who are in the childbearing age and who have menstrual periods. This type of cyst is called a follicle cyst that becomes an ovulation cyst (corpus luteum cyst) after it produces the women's egg. It later goes away on its own if the woman does not become pregnant. There are other kinds of ovarian cysts that may cause problems and may need to be treated. The most serious problem is a cyst with cancer. It should be noted that menopausal women who have an ovarian cyst are at a higher risk of it being a cancer cyst. They should be evaluated very quickly, thoroughly and followed closely. This is especially true in menopausal women because of the high rate of ovarian cancer in women in menopause.  CAUSES AND TYPES OF OVARIAN CYSTS:   FUNCTIONAL CYST: The follicle/corpus luteum cyst is a functional cyst that occurs every month during ovulation with the menstrual cycle. They go away with the next menstrual cycle if the woman does not get pregnant. Usually, there are no symptoms with a functional cyst.   ENDOMETRIOMA CYST: This cyst develops from the lining of the uterus tissue. This cyst gets in or on the ovary. It grows every month from the bleeding during the menstrual period. It is also called a "chocolate cyst" because it becomes filled with blood that turns brown. This cyst can cause pain in the lower abdomen during intercourse and with your menstrual period.   CYSTADENOMA CYST: This cyst develops from the cells on the outside of the ovary. They usually are not cancerous. They can get very big and cause lower abdomen pain and pain with intercourse. This type of cyst can twist on itself, cut off its blood supply and cause severe pain. It  also can easily rupture and cause a lot of pain.   DERMOID CYST: This type of cyst is sometimes found in both ovaries. They are found to have different kinds of body tissue in the cyst. The tissue includes skin, teeth, hair, and/or cartilage. They usually do not have symptoms unless they get very big. Dermoid cysts are rarely cancerous.   POLYCYSTIC OVARY: This is a rare condition with hormone problems that produces many small cysts on both ovaries. The cysts are follicle-like cysts that never produce an egg and become a corpus luteum. It can cause an increase in body weight, infertility, acne, increase in body and facial hair and lack of menstrual periods or rare menstrual periods. Many women with this problem develop type 2 diabetes. The exact cause of this problem is unknown. A polycystic ovary is rarely cancerous.   THECA LUTEIN CYST: Occurs when too much hormone (human chorionic gonadotropin) is produced and over-stimulates the ovaries to produce an egg. They are frequently seen when doctors stimulate the ovaries for invitro-fertilization (test tube babies).   LUTEOMA CYST: This cyst is seen during pregnancy. Rarely it can cause an obstruction to the birth canal during labor and delivery. They usually go away after delivery.  SYMPTOMS    Pelvic pain or pressure.   Pain during sexual intercourse.   Increasing girth (swelling) of the abdomen.   Abnormal menstrual periods.   Increasing pain with menstrual periods.     You stop having menstrual periods and you are not pregnant.  DIAGNOSIS   The diagnosis can be made during:   Routine or annual pelvic examination (common).   Ultrasound.   X-ray of the pelvis.   CT Scan.   MRI.   Blood tests.  TREATMENT    Treatment may only be to follow the cyst monthly for 2 to 3 months with your caregiver. Many go away on their own, especially functional cysts.   May be aspirated (drained) with a long needle with ultrasound, or by laparoscopy (inserting a tube into  the pelvis through a small incision).   The whole cyst can be removed by laparoscopy.   Sometimes the cyst may need to be removed through an incision in the lower abdomen.   Hormone treatment is sometimes used to help dissolve certain cysts.   Birth control pills are sometimes used to help dissolve certain cysts.  HOME CARE INSTRUCTIONS   Follow your caregiver's advice regarding:   Medicine.   Follow up visits to evaluate and treat the cyst.   You may need to come back or make an appointment with another caregiver, to find the exact cause of your cyst, if your caregiver is not a gynecologist.   Get your yearly and recommended pelvic examinations and Pap tests.   Let your caregiver know if you have had an ovarian cyst in the past.  SEEK MEDICAL CARE IF:    Your periods are late, irregular, they stop, or are painful.   Your stomach (abdomen) or pelvic pain does not go away.   Your stomach becomes larger or swollen.   You have pressure on your bladder or trouble emptying your bladder completely.   You have painful sexual intercourse.   You have feelings of fullness, pressure, or discomfort in your stomach.   You lose weight for no apparent reason.   You feel generally ill.   You become constipated.   You lose your appetite.   You develop acne.   You have an increase in body and facial hair.   You are gaining weight, without changing your exercise and eating habits.   You think you are pregnant.  SEEK IMMEDIATE MEDICAL CARE IF:    You have increasing abdominal pain.   You feel sick to your stomach (nausea) and/or vomit.   You develop a fever that comes on suddenly.   You develop abdominal pain during a bowel movement.   Your menstrual periods become heavier than usual.  Document Released: 03/18/2005 Document Revised: 06/10/2011 Document Reviewed: 01/19/2009  [employer] Patient Information 2014 [employer].

## 2012-12-11 LAB — URINALYSIS, MICROSCOPIC ONLY
Bacteria, UA: NONE SEEN
Casts: NONE SEEN
Crystals: NONE SEEN
Squamous Epithelial / LPF: NONE SEEN

## 2012-12-12 LAB — URINE CULTURE
Colony Count: NO GROWTH
Organism ID, Bacteria: NO GROWTH

## 2013-02-04 ENCOUNTER — Other Ambulatory Visit: Payer: Self-pay

## 2013-02-11 ENCOUNTER — Encounter: Payer: Self-pay | Admitting: Obstetrics and Gynecology

## 2013-02-11 ENCOUNTER — Ambulatory Visit (INDEPENDENT_AMBULATORY_CARE_PROVIDER_SITE_OTHER): Payer: 59 | Admitting: Obstetrics and Gynecology

## 2013-02-11 ENCOUNTER — Other Ambulatory Visit: Payer: 59

## 2013-02-11 ENCOUNTER — Ambulatory Visit (INDEPENDENT_AMBULATORY_CARE_PROVIDER_SITE_OTHER): Payer: 59

## 2013-02-11 VITALS — BP 120/78 | HR 70 | Ht 71.0 in | Wt 199.0 lb

## 2013-02-11 DIAGNOSIS — N83209 Unspecified ovarian cyst, unspecified side: Secondary | ICD-10-CM

## 2013-02-11 DIAGNOSIS — N83202 Unspecified ovarian cyst, left side: Secondary | ICD-10-CM

## 2013-02-11 DIAGNOSIS — E785 Hyperlipidemia, unspecified: Secondary | ICD-10-CM

## 2013-02-11 LAB — LIPID PANEL
Cholesterol: 198 mg/dL (ref 0–200)
HDL: 45 mg/dL (ref >39)
LDL Cholesterol: 134 mg/dL — ABNORMAL HIGH (ref 0–99)
Total CHOL/HDL Ratio: 4.4 Ratio
Triglycerides: 94 mg/dL (ref <150)
VLDL: 19 mg/dL (ref 0–40)

## 2013-02-11 NOTE — Progress Notes (Signed)
Subjective  Patient is here for a pelvic ultrasound follow up of a left ovarian cyst.  Also due for a cholesterol recheck.  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  Has not really adopted an exercise routine.  Objective  See ultrasound below - IUD in canal.  Normal endometrium, uterus, and ovaries.  Left ovarian cyst resolved.      Assessment  Left ovarian cyst - resolved. Mirena IUD patient.  Hyperlipidemia. Family history of cardiovascular disease of patient's mother.   Plan  Reassurance regarding cyst resolution.  Fasting lipid profile today.  I discussed diet and exercise and gave patient written information to take home with her today.  If no significant improvement in cholesterol panel, will have patient follow up with PCP to discuss lipid lowering agent.   10 minutes with patient of which over 50% was spent in counseling the patient.

## 2013-02-11 NOTE — Patient Instructions (Signed)
Fat and Cholesterol Control Diet Fat and cholesterol levels in your blood and organs are influenced by your diet. High levels of fat and cholesterol may lead to diseases of the heart, small and large blood vessels, gallbladder, liver, and pancreas. CONTROLLING FAT AND CHOLESTEROL WITH DIET Although exercise and lifestyle factors are important, your diet is key. That is because certain foods are known to raise cholesterol and others to lower it. The goal is to balance foods for their effect on cholesterol and more importantly, to replace saturated and trans fat with other types of fat, such as monounsaturated fat, polyunsaturated fat, and omega-3 fatty acids. On average, a person should consume no more than 15 to 17 g of saturated fat daily. Saturated and trans fats are considered "bad" fats, and they will raise LDL cholesterol. Saturated fats are primarily found in animal products such as meats, butter, and cream. However, that does not mean you need to give up all your favorite foods. Today, there are good tasting, low-fat, low-cholesterol substitutes for most of the things you like to eat. Choose low-fat or nonfat alternatives. Choose round or loin cuts of red meat. These types of cuts are lowest in fat and cholesterol. Chicken (without the skin), fish, veal, and ground turkey breast are great choices. Eliminate fatty meats, such as hot dogs and salami. Even shellfish have little or no saturated fat. Have a 3 oz (85 g) portion when you eat lean meat, poultry, or fish. Trans fats are also called "partially hydrogenated oils." They are oils that have been scientifically manipulated so that they are solid at room temperature resulting in a longer shelf life and improved taste and texture of foods in which they are added. Trans fats are found in stick margarine, some tub margarines, cookies, crackers, and baked goods.  When baking and cooking, oils are a great substitute for butter. The monounsaturated oils are  especially beneficial since it is believed they lower LDL and raise HDL. The oils you should avoid entirely are saturated tropical oils, such as coconut and palm.  Remember to eat a lot from food groups that are naturally free of saturated and trans fat, including fish, fruit, vegetables, beans, grains (barley, rice, couscous, bulgur wheat), and pasta (without cream sauces).  IDENTIFYING FOODS THAT LOWER FAT AND CHOLESTEROL  Soluble fiber may lower your cholesterol. This type of fiber is found in fruits such as apples, vegetables such as broccoli, potatoes, and carrots, legumes such as beans, peas, and lentils, and grains such as barley. Foods fortified with plant sterols (phytosterol) may also lower cholesterol. You should eat at least 2 g per day of these foods for a cholesterol lowering effect.  Read package labels to identify low-saturated fats, trans fat free, and low-fat foods at the supermarket. Select cheeses that have only 2 to 3 g saturated fat per ounce. Use a heart-healthy tub margarine that is free of trans fats or partially hydrogenated oil. When buying baked goods (cookies, crackers), avoid partially hydrogenated oils. Breads and muffins should be made from whole grains (whole-wheat or whole oat flour, instead of "flour" or "enriched flour"). Buy non-creamy canned soups with reduced salt and no added fats.  FOOD PREPARATION TECHNIQUES  Never deep-fry. If you must fry, either stir-fry, which uses very little fat, or use non-stick cooking sprays. When possible, broil, bake, or roast meats, and steam vegetables. Instead of putting butter or margarine on vegetables, use lemon and herbs, applesauce, and cinnamon (for squash and sweet potatoes). Use nonfat   yogurt, salsa, and low-fat dressings for salads.  LOW-SATURATED FAT / LOW-FAT FOOD SUBSTITUTES Meats / Saturated Fat (g)  Avoid: Steak, marbled (3 oz/85 g) / 11 g  Choose: Steak, lean (3 oz/85 g) / 4 g  Avoid: Hamburger (3 oz/85 g) / 7  g  Choose: Hamburger, lean (3 oz/85 g) / 5 g  Avoid: Ham (3 oz/85 g) / 6 g  Choose: Ham, lean cut (3 oz/85 g) / 2.4 g  Avoid: Chicken, with skin, dark meat (3 oz/85 g) / 4 g  Choose: Chicken, skin removed, dark meat (3 oz/85 g) / 2 g  Avoid: Chicken, with skin, light meat (3 oz/85 g) / 2.5 g  Choose: Chicken, skin removed, light meat (3 oz/85 g) / 1 g Dairy / Saturated Fat (g)  Avoid: Whole milk (1 cup) / 5 g  Choose: Low-fat milk, 2% (1 cup) / 3 g  Choose: Low-fat milk, 1% (1 cup) / 1.5 g  Choose: Skim milk (1 cup) / 0.3 g  Avoid: Hard cheese (1 oz/28 g) / 6 g  Choose: Skim milk cheese (1 oz/28 g) / 2 to 3 g  Avoid: Cottage cheese, 4% fat (1 cup) / 6.5 g  Choose: Low-fat cottage cheese, 1% fat (1 cup) / 1.5 g  Avoid: Ice cream (1 cup) / 9 g  Choose: Sherbet (1 cup) / 2.5 g  Choose: Nonfat frozen yogurt (1 cup) / 0.3 g  Choose: Frozen fruit bar / trace  Avoid: Whipped cream (1 tbs) / 3.5 g  Choose: Nondairy whipped topping (1 tbs) / 1 g Condiments / Saturated Fat (g)  Avoid: Mayonnaise (1 tbs) / 2 g  Choose: Low-fat mayonnaise (1 tbs) / 1 g  Avoid: Butter (1 tbs) / 7 g  Choose: Extra light margarine (1 tbs) / 1 g  Avoid: Coconut oil (1 tbs) / 11.8 g  Choose: Olive oil (1 tbs) / 1.8 g  Choose: Corn oil (1 tbs) / 1.7 g  Choose: Safflower oil (1 tbs) / 1.2 g  Choose: Sunflower oil (1 tbs) / 1.4 g  Choose: Soybean oil (1 tbs) / 2.4 g  Choose: Canola oil (1 tbs) / 1 g Document Released: 03/18/2005 Document Revised: 07/13/2012 Document Reviewed: 09/06/2010 [employer] Patient Information 2014 [place], [place].   Exercise to Lose Weight Exercise and a healthy diet may help you lose weight. Your doctor may suggest specific exercises. EXERCISE IDEAS AND TIPS  Choose low-cost things you enjoy doing, such as walking, bicycling, or exercising to workout videos.  Take stairs instead of the elevator.  Walk during your lunch break.  Park your car further  away from work or school.  Go to a gym or an exercise class.  Start with 5 to 10 minutes of exercise each day. Build up to 30 minutes of exercise 4 to 6 days a week.  Wear shoes with good support and comfortable clothes.  Stretch before and after working out.  Work out until you breathe harder and your heart beats faster.  Drink extra water when you exercise.  Do not do so much that you hurt yourself, feel dizzy, or get very short of breath. Exercises that burn about 150 calories:  Running 1  miles in 15 minutes.  Playing volleyball for 45 to 60 minutes.  Washing and waxing a car for 45 to 60 minutes.  Playing touch football for 45 minutes.  Walking 1  miles in 35 minutes.  Pushing a stroller 1  miles in 30  minutes.  Playing basketball for 30 minutes.  Raking leaves for 30 minutes.  Bicycling 5 miles in 30 minutes.  Walking 2 miles in 30 minutes.  Dancing for 30 minutes.  Shoveling snow for 15 minutes.  Swimming laps for 20 minutes.  Walking up stairs for 15 minutes.  Bicycling 4 miles in 15 minutes.  Gardening for 30 to 45 minutes.  Jumping rope for 15 minutes.  Washing windows or floors for 45 to 60 minutes. Document Released: 04/20/2010 Document Revised: 06/10/2011 Document Reviewed: 04/20/2010 [facility] Patient Information 2014 [place], [place].

## 2013-09-23 ENCOUNTER — Other Ambulatory Visit: Payer: Self-pay

## 2013-09-23 DIAGNOSIS — Z1231 Encounter for screening mammogram for malignant neoplasm of breast: Secondary | ICD-10-CM

## 2013-10-14 ENCOUNTER — Ambulatory Visit
Admission: RE | Admit: 2013-10-14 | Discharge: 2013-10-14 | Disposition: A | Discharge status: Home / Self Care | Payer: 59 | Source: Ambulatory Visit

## 2013-10-14 DIAGNOSIS — Z1231 Encounter for screening mammogram for malignant neoplasm of breast: Secondary | ICD-10-CM

## 2013-10-28 ENCOUNTER — Ambulatory Visit (INDEPENDENT_AMBULATORY_CARE_PROVIDER_SITE_OTHER): Payer: 59 | Admitting: Obstetrics and Gynecology

## 2013-10-28 ENCOUNTER — Encounter: Payer: Self-pay | Admitting: Obstetrics and Gynecology

## 2013-10-28 VITALS — BP 110/70 | HR 76 | Resp 14 | Ht 70.5 in | Wt 198.6 lb

## 2013-10-28 DIAGNOSIS — R1032 Left lower quadrant pain: Secondary | ICD-10-CM | POA: Insufficient documentation

## 2013-10-28 DIAGNOSIS — Z0000 Encounter for general adult medical examination without abnormal findings: Secondary | ICD-10-CM

## 2013-10-28 DIAGNOSIS — Z01419 Encounter for gynecological examination (general) (routine) without abnormal findings: Secondary | ICD-10-CM

## 2013-10-28 DIAGNOSIS — G43829 Menstrual migraine, not intractable, without status migrainosus: Secondary | ICD-10-CM | POA: Insufficient documentation

## 2013-10-28 LAB — CBC
HCT: 39.7 % (ref 36.0–46.0)
Hemoglobin: 13.6 g/dL (ref 12.0–15.0)
MCH: 30.8 pg (ref 26.0–34.0)
MCHC: 34.3 g/dL (ref 30.0–36.0)
MCV: 90 fL (ref 78.0–100.0)
Platelets: 230 10*3/uL (ref 150–400)
RBC: 4.41 MIL/uL (ref 3.87–5.11)
RDW: 13.6 % (ref 11.5–15.5)
WBC: 5.1 10*3/uL (ref 4.0–10.5)

## 2013-10-28 LAB — POCT URINALYSIS DIPSTICK
Bilirubin, UA: NEGATIVE
Glucose, UA: NEGATIVE
Ketones, UA: NEGATIVE
Leukocytes, UA: NEGATIVE
Nitrite, UA: NEGATIVE
Protein, UA: NEGATIVE
Urobilinogen, UA: NEGATIVE
pH, UA: 5

## 2013-10-28 LAB — HEMOGLOBIN, FINGERSTICK: Hemoglobin, fingerstick: 13.4 g/dL (ref 12.0–16.0)

## 2013-10-28 MED ORDER — ESTROGENS CONJUGATED 0.3 MG PO TABS: ORAL_TABLET | ORAL | Status: DC

## 2013-10-28 NOTE — Patient Instructions (Signed)
EXERCISE AND DIET:  We recommended that you start or continue a regular exercise program for good health. Regular exercise means any activity that makes your heart beat faster and makes you sweat.  We recommend exercising at least 30 minutes per day at least 3 days a week, preferably 4 or 5.  We also recommend a diet low in fat and sugar.  Inactivity, poor dietary choices and obesity can cause diabetes, heart attack, stroke, and kidney damage, among others.    ALCOHOL AND SMOKING:  Women should limit their alcohol intake to no more than 7 drinks/beers/glasses of wine (combined, not each!) per week. Moderation of alcohol intake to this level decreases your risk of breast cancer and liver damage. And of course, no recreational drugs are part of a healthy lifestyle.  And absolutely no smoking or even second hand smoke. Most people know smoking can cause heart and lung diseases, but did you know it also contributes to weakening of your bones? Aging of your skin?  Yellowing of your teeth and nails?  CALCIUM AND VITAMIN D:  Adequate intake of calcium and Vitamin D are recommended.  The recommendations for exact amounts of these supplements seem to change often, but generally speaking 600 mg of calcium (either carbonate or citrate) and 800 units of Vitamin D per day seems prudent. Certain women may benefit from higher intake of Vitamin D.  If you are among these women, your doctor will have told you during your visit.    PAP SMEARS:  Pap smears, to check for cervical cancer or precancers,  have traditionally been done yearly, although recent scientific advances have shown that most women can have pap smears less often.  However, every woman still should have a physical exam from her gynecologist every year. It will include a breast check, inspection of the vulva and vagina to check for abnormal growths or skin changes, a visual exam of the cervix, and then an exam to evaluate the size and shape of the uterus and  ovaries.  And after 47 years of age, a rectal exam is indicated to check for rectal cancers. We will also provide age appropriate advice regarding health maintenance, like when you should have certain vaccines, screening for sexually transmitted diseases, bone density testing, colonoscopy, mammograms, etc.   MAMMOGRAMS:  All women over 40 years old should have a yearly mammogram. Many facilities now offer a "3D" mammogram, which may cost around $50 extra out of pocket. If possible,  we recommend you accept the option to have the 3D mammogram performed.  It both reduces the number of women who will be called back for extra views which then turn out to be normal, and it is better than the routine mammogram at detecting truly abnormal areas.    COLONOSCOPY:  Colonoscopy to screen for colon cancer is recommended for all women at age 50.  We know, you hate the idea of the prep.  We agree, BUT, having colon cancer and not knowing it is worse!!  Colon cancer so often starts as a polyp that can be seen and removed at colonscopy, which can quite literally save your life!  And if your first colonoscopy is normal and you have no family history of colon cancer, most women don't have to have it again for 10 years.  Once every ten years, you can do something that may end up saving your life, right?  We will be happy to help you get it scheduled when you are ready.    Be sure to check your insurance coverage so you understand how much it will cost.  It may be covered as a preventative service at no cost, but you should check your particular policy.     Conjugated Estrogens tablets What is this medicine? CONJUGATED ESTROGENS (CON ju gate ed ESS troe jenz) is an estrogen. It is used as hormone replacement in menopausal women. It helps to treat hot flashes and prevent osteoporosis. It is also used to treat women with low hormone levels or in those who have had their ovaries removed. This medicine may be used for other  purposes; ask your health care provider or pharmacist if you have questions. COMMON BRAND NAME(S): Premarin What should I tell my health care provider before I take this medicine? They need to know if you have any of these conditions: -abnormal vaginal bleeding -blood vessel disease or blood clots -breast, cervical, endometrial, ovarian, liver, or uterine cancer -dementia -diabetes -endometriosis -fibroids -gallbladder disease -heart disease or recent heart attack -high blood pressure -high cholesterol -high level of calcium in the blood -kidney disease -liver disease -mental depression -migraine headaches -protein C deficiency -protein S deficiency -stroke -tobacco smoker -an unusual or allergic reaction to estrogens, other medicines, foods, dyes, or preservatives -pregnant or trying to get pregnant -breast-feeding How should I use this medicine? Take this medicine by mouth with a glass of water. Follow the directions on the prescription label. Take your medicine at regular intervals, at the same time each day. Do not take your medicine more often than directed. A patient package insert for the product will be given with each prescription and refill. Read this sheet carefully each time. Talk to your pediatrician regarding the use of this medicine in children. This medicine is not approved for use in children. Overdosage: If you think you have taken too much of this medicine contact a poison control center or emergency room at once. NOTE: This medicine is only for you. Do not share this medicine with others. What if I miss a dose? If you miss a dose, take it as soon as you can. If it is almost time for your next dose, take only that dose. Do not take double or extra doses. What may interact with this medicine? Do not take this medicine with any of the following medications: -aromatase inhibitors like aminoglutethimide, anastrozole, exemestane, letrozole,  testolactone -metyrapone This medicine may also interact with the following medications: -barbiturates, such as phenobarbital -carbamazepine -clarithromycin -erythromycin -grapefruit juice -medicines for fungal infections like ketoconazole and itraconazole -phenytoin - rifampin -ritonavir -St. John's Wort -thyroid hormones This list may not describe all possible interactions. Give your health care provider a list of all the medicines, herbs, non-prescription drugs, or dietary supplements you use. Also tell them if you smoke, drink alcohol, or use illegal drugs. Some items may interact with your medicine. What should I watch for while using this medicine? Visit your health care professional for regular checks on your progress. You will need a regular breast and pelvic exam and Pap smear while on this medicine. You should also discuss the need for regular mammograms with your health care professional, and follow his or her guidelines for these tests. This medicine can make your body retain fluid, making your fingers, hands, or ankles swell. Your blood pressure can go up. Contact your doctor or health care professional if you feel you are retaining fluid. If you have any reason to think you are pregnant; stop taking this medicine at once and  contact your doctor or health care professional. Smoking increases the risk of getting a blood clot or having a stroke while you are taking this medicine, especially if you are more than 47 years old. You are strongly advised not to smoke. If you wear contact lenses and notice visual changes, or if the lenses begin to feel uncomfortable, consult your eye care specialist. The tablet shell for some brands of this medicine does not dissolve. The tablet shell may appear whole in the stool. This is not a cause for concern. If you see something that resembles a tablet in your stool, talk to your healthcare provider. This medicine can increase the risk of developing  a condition (endometrial hyperplasia) that may lead to cancer of the lining of the uterus. Taking progestins, another hormone drug, with this medicine lowers the risk of developing this condition. Therefore, if your uterus has not been removed (by a hysterectomy), your doctor may prescribe a progestin for you to take together with your estrogen. You should know, however, that taking estrogens with progestins may have additional health risks. You should discuss the use of estrogens and progestins with your health care professional to determine the benefits and risks for you. If you are going to have surgery, you may need to stop taking this medicine. Consult your health care professional for advice before you schedule the surgery. What side effects may I notice from receiving this medicine? Side effects that you should report to your doctor or health care professional as soon as possible: -allergic reactions like skin rash, itching or hives, swelling of the face, lips, or tongue -breast tissue changes or discharge -changes in vision -chest pain -confusion, trouble speaking or understanding -dark urine -general ill feeling or flu-like symptoms -light-colored stools -nausea, vomiting -pain, swelling, warmth in the leg -right upper belly pain -severe headaches -shortness of breath -sudden numbness or weakness of the face, arm or leg -trouble walking, dizziness, loss of balance or coordination -unusual vaginal bleeding -yellowing of the eyes or skin Side effects that usually do not require medical attention (report to your doctor or health care professional if they continue or are bothersome): -hair loss -increased hunger or thirst -increased urination -symptoms of vaginal infection like itching, irritation or unusual discharge -unusually weak or tired This list may not describe all possible side effects. Call your doctor for medical advice about side effects. You may report side effects to FDA  at 1-800-FDA-1088. Where should I keep my medicine? Keep out of the reach of children. Store at room temperature between 15 and 30 degrees C (59 and 86 degrees F). Throw away any unused medicine after the expiration date. NOTE: This sheet is a summary. It may not cover all possible information. If you have questions about this medicine, talk to your doctor, pharmacist, or health care provider.  2015, [employer]. (2010-06-20 09:20:56)

## 2013-10-28 NOTE — Progress Notes (Signed)
Patient ID: Sandra [name], female   DOB: Sep 15, 1965, 48 y.o.   MRN: [id number] GYNECOLOGY VISIT  PCP:  Kathyrn Lass, MD  Referring provider:   HPI: 48 y.o.   Married  Caucasian  female   G2P2002 with Patient's last menstrual period was 10/20/2013.   here for   AEX Still with LLQ pain.  Occurs off and on.  Dull constant pain when it occurs. Not really correlated to anything.  Movement does not make it worse. Not enough that wants CT or further evaluation.   Had a left ovarian cyst which resolved last year.  HA for 2 days prior to menses. Menses are irregular. Menses every 4 - 6 weeks.   Can last one day or longer. Has Mirena IUD.   Hgb:    13.4 Urine:   1+ RBC's (see LMP).  No dysuria.   GYNECOLOGIC HISTORY: Patient's last menstrual period was 10/20/2013. Sexually active:  yes Partner preference: female Contraception: Mirena IUD--placed 05/2011  Menopausal hormone therapy: n/a DES exposure:   no Blood transfusions:  no Sexually transmitted diseases:   no GYN procedures and prior surgeries:   Last mammogram:  10-14-13 wnl:[facility]               Last pap and high risk HPV testing:   09/2011 wnl:neg HR HPV History of abnormal pap smear: no    OB History   Grav Para Term Preterm Abortions TAB SAB Ect Mult Living   2 2 2       2        LIFESTYLE: Exercise:   walking            Tobacco: no Alcohol:no Drug use:  no  OTHER HEALTH MAINTENANCE: Tetanus/TDap:  2012 Gardisil:             n/a Influenza:          12/2012 Zostavax:           n/a  Bone density:    n/a Colonoscopy:    2012  Cholesterol check:  borderline  Family History  Problem Relation Age of Onset  . Stroke Mother   . Heart disease Mother     deceased  . Colon cancer Father   . Hypertension Brother     Patient Active Problem List   Diagnosis Date Noted  . Other and unspecified hyperlipidemia 02/11/2013   Past Medical History  Diagnosis Date  . Lichen sclerosus     vulva  .  Endometriosis     Past Surgical History  Procedure Laterality Date  . Mandible reconstruction  1985  . Shinn surgery  1988  . Pelvic laparoscopy  2004    endometriosis  . Appendectomy  2005  . Cesarean section  2002    ALLERGIES: Review of patient's allergies indicates no known allergies.  Current Outpatient Prescriptions  Medication Sig Dispense Refill  . clobetasol cream (TEMOVATE) 9.70 % Apply 1 application topically once a week.  30 g  1  . levonorgestrel (MIRENA) 20 MCG/24HR IUD 1 each by Intrauterine route once.       No current facility-administered medications for this visit.     ROS:  Pertinent items are noted in HPI.  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    PHYSICAL EXAMINATION:    BP 110/70  Pulse 76  Resp 14  Ht 5' 10.5" (1.791 m)  Wt 198 lb 9.6 oz (90.084 kg)  BMI 28.08 kg/m2  LMP 10/20/2013   Wt  Readings from Last 3 Encounters:  10/28/13 198 lb 9.6 oz (90.084 kg)  02/11/13 199 lb (90.266 kg)  12/10/12 203 lb 8 oz (92.307 kg)     Ht Readings from Last 3 Encounters:  10/28/13 5' 10.5" (1.791 m)  02/11/13 5\' 11"  (1.803 m)  12/10/12 5\' 11"  (1.803 m)    General appearance: alert, cooperative and appears stated age Head: Normocephalic, without obvious abnormality, atraumatic Neck: no adenopathy, supple, symmetrical, trachea midline and thyroid not enlarged, symmetric, no tenderness/mass/nodules Lungs: clear to auscultation bilaterally Breasts: Inspection negative, No nipple retraction or dimpling, No nipple discharge or bleeding, No axillary or supraclavicular adenopathy, Normal to palpation without dominant masses Heart: regular rate and rhythm Abdomen: soft, non-tender; no masses,  no organomegaly Extremities: extremities normal, atraumatic, no cyanosis or edema Skin: Skin color, texture, turgor normal. No rashes or lesions Lymph nodes: Cervical, supraclavicular, and axillary nodes normal. No abnormal inguinal nodes palpated Neurologic: Grossly  normal  Pelvic: External genitalia:  no lesions              Urethra:  normal appearing urethra with no masses, tenderness or lesions              Bartholins and Skenes: normal                 Vagina: normal appearing vagina with normal color and discharge, no lesions              Cervix: normal appearance.  IUD strings not seen (Last year the same.  Ultrasound showed IUD.)              Pap and high risk HPV testing done: No..            Bimanual Exam:  Uterus:  uterus is normal size, shape, consistency and nontender                                      Adnexa: normal adnexa in size, nontender and no masses                                      Rectovaginal: Confirms                                      Anus:  normal sphincter tone, no lesions  ASSESSMENT  Normal gynecologic exam. LLQ discomfort. Uncertain etiology.  Menstrual headaches.  Inability to track cycles.  Mirena IUD.  Seen on prior ultrasound.   PLAN  Mammogram recommended yearly.  Pap smear and high risk HPV testing. Premarin 0.3 mg daily.  Discussed risk of DVT, PE, MI, stroke.  Counseled on self breast exam, Calcium and vitamin D intake, exercise. Lipid profile, CMP, CBC.  Monitor LLQ pain.  Will consider CT scan.  Declines currently.  Return annually or prn Will also do a three month recheck.    An After Visit Summary was printed and given to the patient.

## 2013-10-29 LAB — COMPREHENSIVE METABOLIC PANEL
ALT: 10 U/L (ref 0–35)
AST: 12 U/L (ref 0–37)
Albumin: 4 g/dL (ref 3.5–5.2)
Alkaline Phosphatase: 38 U/L — ABNORMAL LOW (ref 39–117)
BUN: 10 mg/dL (ref 6–23)
CO2: 28 mEq/L (ref 19–32)
Calcium: 9 mg/dL (ref 8.4–10.5)
Chloride: 103 mEq/L (ref 96–112)
Creat: 0.85 mg/dL (ref 0.50–1.10)
Glucose, Bld: 77 mg/dL (ref 70–99)
Potassium: 4.3 mEq/L (ref 3.5–5.3)
Sodium: 137 mEq/L (ref 135–145)
Total Bilirubin: 0.7 mg/dL (ref 0.2–1.2)
Total Protein: 6.7 g/dL (ref 6.0–8.3)

## 2013-10-29 LAB — LIPID PANEL
Cholesterol: 191 mg/dL (ref 0–200)
HDL: 49 mg/dL (ref >39)
LDL Cholesterol: 122 mg/dL — ABNORMAL HIGH (ref 0–99)
Total CHOL/HDL Ratio: 3.9 Ratio
Triglycerides: 100 mg/dL (ref <150)
VLDL: 20 mg/dL (ref 0–40)

## 2013-11-30 ENCOUNTER — Encounter: Payer: Self-pay | Admitting: Obstetrics and Gynecology

## 2014-01-25 ENCOUNTER — Telehealth: Payer: Self-pay | Admitting: Obstetrics and Gynecology

## 2014-01-25 NOTE — Telephone Encounter (Signed)
Left message to call Sandra [name] at [phone]. 

## 2014-01-25 NOTE — Telephone Encounter (Signed)
Left message to call Kaitlyn at [phone]. 

## 2014-01-25 NOTE — Telephone Encounter (Signed)
The patient requests to speak with the nurse about increased pelvic pain since starting her menstrual cycle 01/22/14. She already has a 3 month recheck appointment 01/28/14 with Dr. Quincy Simmonds but wants to know if she will need an ultrasound instead.

## 2014-01-27 NOTE — Telephone Encounter (Signed)
Spoke with patient. Patient states that she had a cyst previously which resolved on its own. Patient is scheduled for 3 month follow up from annual with Dr.Silva tomorrow at 9:30am. Patient states that her cycle started on Saturday 10/24 and she was in "extreme pain" until yesterday. "I am not in any pain now. I feel fine. It all went away. I was worried but today I have not had any pain." Advised patient we are glad to hear she is feeling better. Advised to let Dr.Silva know when she comes in for appointment tomorrow as she may want to have patient have another PUS due to hx of cyst. Advised Dr.Silva will be able to make appropriate and best suggestions for her care tomorrow. Advised if anything changes throughout the day and the pain returns will need to give [place] a call to see provider sooner. Patient is agreeable.  Routing to provider for final review. Patient agreeable to disposition. Will close encounter

## 2014-01-27 NOTE — Telephone Encounter (Signed)
Left message to call Kaitlyn at [phone]. 

## 2014-01-28 ENCOUNTER — Ambulatory Visit: Payer: 59 | Admitting: Obstetrics and Gynecology

## 2014-01-28 ENCOUNTER — Encounter: Payer: Self-pay | Admitting: Obstetrics and Gynecology

## 2014-01-28 ENCOUNTER — Ambulatory Visit (INDEPENDENT_AMBULATORY_CARE_PROVIDER_SITE_OTHER): Payer: 59 | Admitting: Obstetrics and Gynecology

## 2014-01-28 VITALS — BP 110/70 | HR 80 | Temp 98.3°F | Ht 70.5 in | Wt 198.4 lb

## 2014-01-28 DIAGNOSIS — R1032 Left lower quadrant pain: Secondary | ICD-10-CM

## 2014-01-28 NOTE — Progress Notes (Signed)
Patient ID: Sandra [name], female   DOB: 05/11/1965, 48 y.o.   MRN: [id number] GYNECOLOGY VISIT  PCP:   Kathyrn Lass, MD  Referring provider:   HPI: 48 y.o.   Married  Caucasian  female   G2P2002 with Patient's last menstrual period was 01/22/2014.   here for 3 month follow up and complaining of off and on LLQ pain.  Comes and goes.  Not related to her menses. Has occurred since IUD was placed.  Menses are very light.  No relation to bowel function or urination.  Movement does not make it worse. Standing can increase the pain.  Taking OTC ibuprofen.   Last colonoscopy was 5 years ago.  Goes to [facility].   GYNECOLOGIC HISTORY: Patient's last menstrual period was 01/22/2014. Sexually active:  yes Partner preference: female Contraception: Mirena IUD--inserted 05/2011  Menopausal hormone therapy: n/a DES exposure: no   Blood transfusions: no   Sexually transmitted diseases:   no GYN procedures and prior surgeries: no  Last mammogram:  10-14-13 wnl:[facility]               Last pap and high risk HPV testing:  09/2011 wnl:neg HR HPV  History of abnormal pap smear:  no   OB History   Grav Para Term Preterm Abortions TAB SAB Ect Mult Living   2 2 2       2        LIFESTYLE: Exercise:  walking             Tobacco:   no Alcohol:   no Drug use:  no   Patient Active Problem List   Diagnosis Date Noted  . Menstrual headache 10/28/2013  . LLQ pain 10/28/2013  . Other and unspecified hyperlipidemia 02/11/2013    Past Medical History  Diagnosis Date  . Lichen sclerosus     vulva  . Endometriosis     Past Surgical History  Procedure Laterality Date  . Mandible reconstruction  1985  . Shinn surgery  1988  . Pelvic laparoscopy  2004    endometriosis  . Appendectomy  2005  . Cesarean section  2002    Current Outpatient Prescriptions  Medication Sig Dispense Refill  . clobetasol cream (TEMOVATE) 9.83 % Apply 1 application topically once a week.  30 g  1  .  levonorgestrel (MIRENA) 20 MCG/24HR IUD 1 each by Intrauterine route once.       No current facility-administered medications for this visit.     ALLERGIES: Review of patient's allergies indicates no known allergies.  Family History  Problem Relation Age of Onset  . Stroke Mother   . Heart disease Mother     deceased  . Colon cancer Father   . Hypertension Brother     History   Social History  . Marital Status: Married    Spouse Name: N/A    Number of Children: N/A  . Years of Education: N/A   Occupational History  . Not on file.   Social History Main Topics  . Smoking status: Never Smoker   . Smokeless tobacco: Not on file  . Alcohol Use: No  . Drug Use: No  . Sexual Activity: Yes    Partners: Male    Birth Control/ Protection: IUD     Comment: Mirena inserted 05/2011   Other Topics Concern  . Not on file   Social History Narrative  . No narrative on file    ROS:  Pertinent items are noted in  HPI.  PHYSICAL EXAMINATION:    BP 110/70  Pulse 80  Ht 5' 10.5" (1.791 m)  Wt 198 lb 6.4 oz (89.994 kg)  BMI 28.06 kg/m2  LMP 01/22/2014   Wt Readings from Last 3 Encounters:  01/28/14 198 lb 6.4 oz (89.994 kg)  10/28/13 198 lb 9.6 oz (90.084 kg)  02/11/13 199 lb (90.266 kg)     Ht Readings from Last 3 Encounters:  01/28/14 5' 10.5" (1.791 m)  10/28/13 5' 10.5" (1.791 m)  02/11/13 5\' 11"  (1.803 m)    General appearance: alert, cooperative and appears stated age Abdomen: soft, non-tender; no masses,  no organomegaly Extremities: extremities normal, atraumatic, no cyanosis or edema Skin: Skin color, texture, turgor normal. No rashes or lesions Lymph nodes: Cervical, supraclavicular, and axillary nodes normal. No abnormal inguinal nodes palpated Neurologic: Grossly normal  Pelvic: External genitalia:  no lesions              Urethra:  normal appearing urethra with no masses, tenderness or lesions              Bartholins and Skenes: normal                  Vagina: normal appearing vagina with normal color and discharge, no lesions              Cervix: normal appearance.  Tip of IUD strings seen.                  Bimanual Exam:  Uterus:  uterus is normal size, shape, consistency and nontender                                      Adnexa: normal adnexa in size, nontender and no masses                                   ASSESSMENT  LLQ pain.  Recurrent over time.  History of prior left ovarian cyst.  Mirena IUD patient.  Strings have not always been visible.   PLAN  Return for pelvic ultrasound.  Will consider IUD removal.  If declines IUD removal, will now proceed with GI consult and/or abdominal and pelvic CT scan.  Ibuprofen prn.   An After Visit Summary was printed and given to the patient.  25 minutes face to face time of which over 50% was spent in counseling.

## 2014-01-28 NOTE — Patient Instructions (Signed)
I will see you for the ultrasound and potential IUD removal.

## 2014-01-31 ENCOUNTER — Telehealth: Payer: Self-pay | Admitting: Obstetrics and Gynecology

## 2014-01-31 ENCOUNTER — Encounter: Payer: Self-pay | Admitting: Obstetrics and Gynecology

## 2014-01-31 NOTE — Telephone Encounter (Signed)
iud removal is covered at 100% of allowable

## 2014-01-31 NOTE — Telephone Encounter (Signed)
Left message for patient to call back. Need to go over benefits and schedule PUS °

## 2014-02-10 NOTE — Telephone Encounter (Signed)
Left message for patient to call back  

## 2014-02-16 NOTE — Telephone Encounter (Signed)
Left message for patient to call back  

## 2014-02-18 ENCOUNTER — Telehealth: Payer: Self-pay | Admitting: Obstetrics and Gynecology

## 2014-02-18 NOTE — Telephone Encounter (Signed)
Left message regarding 10/2014 appointment time change.

## 2014-02-22 NOTE — Telephone Encounter (Signed)
Left message for patient to call back. Need to go over benefits. PUS pr $50 with [facility] IUD removal is covered at 100% of allowable.

## 2014-02-28 NOTE — Telephone Encounter (Signed)
I will close the encounter.  Patient will contact us if she would like to proceed.  Her clinical picture may have improved.

## 2014-09-12 ENCOUNTER — Other Ambulatory Visit: Payer: Self-pay

## 2014-09-12 DIAGNOSIS — Z1231 Encounter for screening mammogram for malignant neoplasm of breast: Secondary | ICD-10-CM

## 2014-10-17 ENCOUNTER — Ambulatory Visit
Admission: RE | Admit: 2014-10-17 | Discharge: 2014-10-17 | Disposition: A | Discharge status: Home / Self Care | Payer: 59 | Source: Ambulatory Visit

## 2014-10-17 DIAGNOSIS — Z1231 Encounter for screening mammogram for malignant neoplasm of breast: Secondary | ICD-10-CM

## 2014-10-18 ENCOUNTER — Other Ambulatory Visit: Payer: Self-pay | Admitting: Obstetrics and Gynecology

## 2014-10-18 DIAGNOSIS — R928 Other abnormal and inconclusive findings on diagnostic imaging of breast: Secondary | ICD-10-CM

## 2014-10-20 ENCOUNTER — Ambulatory Visit
Admission: RE | Admit: 2014-10-20 | Discharge: 2014-10-20 | Disposition: A | Discharge status: Home / Self Care | Payer: 59 | Source: Ambulatory Visit | Attending: Obstetrics and Gynecology | Admitting: Obstetrics and Gynecology

## 2014-10-20 DIAGNOSIS — R928 Other abnormal and inconclusive findings on diagnostic imaging of breast: Secondary | ICD-10-CM

## 2014-11-11 ENCOUNTER — Ambulatory Visit: Payer: 59 | Admitting: Obstetrics and Gynecology

## 2014-11-11 ENCOUNTER — Encounter: Payer: Self-pay | Admitting: Obstetrics and Gynecology

## 2014-11-11 ENCOUNTER — Ambulatory Visit (INDEPENDENT_AMBULATORY_CARE_PROVIDER_SITE_OTHER): Payer: 59 | Admitting: Obstetrics and Gynecology

## 2014-11-11 VITALS — BP 108/76 | HR 78 | Resp 14 | Ht 70.5 in | Wt 203.6 lb

## 2014-11-11 DIAGNOSIS — Z0000 Encounter for general adult medical examination without abnormal findings: Secondary | ICD-10-CM

## 2014-11-11 DIAGNOSIS — R5383 Other fatigue: Secondary | ICD-10-CM

## 2014-11-11 DIAGNOSIS — Z01419 Encounter for gynecological examination (general) (routine) without abnormal findings: Secondary | ICD-10-CM

## 2014-11-11 LAB — POCT URINALYSIS DIPSTICK
Bilirubin, UA: NEGATIVE
Glucose, UA: NEGATIVE
Ketones, UA: NEGATIVE
Leukocytes, UA: NEGATIVE
Nitrite, UA: NEGATIVE
Protein, UA: NEGATIVE
Urobilinogen, UA: NEGATIVE
pH, UA: 5

## 2014-11-11 LAB — LIPID PANEL
Cholesterol: 199 mg/dL (ref 125–200)
HDL: 51 mg/dL (ref >=46)
LDL Cholesterol: 132 mg/dL — ABNORMAL HIGH (ref <130)
Total CHOL/HDL Ratio: 3.9 Ratio (ref <=5.0)
Triglycerides: 79 mg/dL (ref <150)
VLDL: 16 mg/dL (ref <30)

## 2014-11-11 LAB — COMPREHENSIVE METABOLIC PANEL
ALT: 12 U/L (ref 6–29)
AST: 14 U/L (ref 10–35)
Albumin: 4.1 g/dL (ref 3.6–5.1)
Alkaline Phosphatase: 33 U/L (ref 33–115)
BUN: 11 mg/dL (ref 7–25)
CO2: 25 mmol/L (ref 20–31)
Calcium: 8.9 mg/dL (ref 8.6–10.2)
Chloride: 107 mmol/L (ref 98–110)
Creat: 0.78 mg/dL (ref 0.50–1.10)
Glucose, Bld: 86 mg/dL (ref 65–99)
Potassium: 4.2 mmol/L (ref 3.5–5.3)
Sodium: 138 mmol/L (ref 135–146)
Total Bilirubin: 0.5 mg/dL (ref 0.2–1.2)
Total Protein: 6.9 g/dL (ref 6.1–8.1)

## 2014-11-11 LAB — CBC
HCT: 38 % (ref 36.0–46.0)
Hemoglobin: 13.1 g/dL (ref 12.0–15.0)
MCH: 30.3 pg (ref 26.0–34.0)
MCHC: 34.5 g/dL (ref 30.0–36.0)
MCV: 88 fL (ref 78.0–100.0)
MPV: 9.6 fL (ref 8.6–12.4)
Platelets: 234 10*3/uL (ref 150–400)
RBC: 4.32 MIL/uL (ref 3.87–5.11)
RDW: 13.7 % (ref 11.5–15.5)
WBC: 3.8 10*3/uL — ABNORMAL LOW (ref 4.0–10.5)

## 2014-11-11 LAB — TSH: TSH: 1.113 u[IU]/mL (ref 0.350–4.500)

## 2014-11-11 LAB — VITAMIN D 25 HYDROXY (VIT D DEFICIENCY, FRACTURES): Vit D, 25-Hydroxy: 30 ng/mL (ref 30–100)

## 2014-11-11 LAB — HEMOGLOBIN, FINGERSTICK: Hemoglobin, fingerstick: 13.3 g/dL (ref 12.0–16.0)

## 2014-11-11 NOTE — Progress Notes (Signed)
49 y.o. G83P2002 Married Caucasian female here for annual exam.    Hx of lichen sclerosus.  No symptoms.   Still has LLQ pain.  Comes and goes.  Has Mirena IUD.  Bleeding monthly.  No significant pain, just fatigue.  Hx of left ovarian cyst.  Hx of endometriosis.   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   PCP:   ----  Patient's last menstrual period was 11/07/2014 (exact date).          Sexually active: Yes.    The current method of family planning is IUD-Mirena. Place 05/2011. Exercising: No.  none Smoker:  no  Health Maintenance: Pap:  09/2011 wnl History of abnormal Pap:  no MMG: 10/17/14 3D dense category C, Bi-rads category 0.  Diagnostic mammogram and ultrasound - 10/20/14 - right breast benign cysts Colonoscopy:  5 years ago-uncertain of results. Maybe had polyps. Due for recheck in 5 years. Dr. Dutch Quint. BMD:   n/a  Result  n/a TDaP:  2 years ago per pt. Screening Labs:  Drawn today Hb today: 13.3, Urine today: RBC ++ pt is currently on cycle.   reports that she has never smoked. She does not have any smokeless tobacco history on file. She reports that she does not drink alcohol or use illicit drugs.  Past Medical History  Diagnosis Date  . Lichen sclerosus     vulva  . Endometriosis     Past Surgical History  Procedure Laterality Date  . Mandible reconstruction  1985  . Shinn surgery  1988  . Pelvic laparoscopy  2004    endometriosis  . Appendectomy  2005  . Cesarean section  2002    Current Outpatient Prescriptions  Medication Sig Dispense Refill  . clobetasol cream (TEMOVATE) 2.84 % Apply 1 application topically once a week. 30 g 1  . levonorgestrel (MIRENA) 20 MCG/24HR IUD 1 each by Intrauterine route once.     No current facility-administered medications for this visit.    Family History  Problem Relation Age of Onset  . Stroke Mother   . Heart disease Mother     deceased  . Colon cancer Father   . Hypertension Brother      ROS:  Pertinent items are noted in HPI.  Otherwise, a comprehensive ROS was negative.  Exam:   Ht 5' 10.5" (1.791 m)  Wt 203 lb 9.6 oz (92.352 kg)  BMI 28.79 kg/m2    General appearance: alert, cooperative and appears stated age Head: Normocephalic, without obvious abnormality, atraumatic Neck: no adenopathy, supple, symmetrical, trachea midline and thyroid normal to inspection and palpation Lungs: clear to auscultation bilaterally Breasts: normal appearance, no masses or tenderness, Inspection negative, No nipple retraction or dimpling, No nipple discharge or bleeding, No axillary or supraclavicular adenopathy Heart: regular rate and rhythm Abdomen: soft, non-tender; bowel sounds normal; no masses,  no organomegaly Extremities: extremities normal, atraumatic, no cyanosis or edema Skin: Skin color, texture, turgor normal. No rashes or lesions Lymph nodes: Cervical, supraclavicular, and axillary nodes normal. No abnormal inguinal nodes palpated Neurologic: Grossly normal  Pelvic: External genitalia:  no lesions              Urethra:  normal appearing urethra with no masses, tenderness or lesions              Bartholins and Skenes: normal                 Vagina: normal appearing vagina with normal color  and discharge, no lesions              Cervix: menstrual blood,  IUD strings seen.               Pap taken: No. Bimanual Exam:  Uterus:  normal size, contour, position, consistency, mobility, non-tender              Adnexa: normal adnexa and no mass, fullness, tenderness              Rectovaginal: Yes.  .  Confirms.              Anus:  normal sphincter tone, no lesions  Chaperone was present for exam.  Assessment:   Well woman visit with normal exam. Mirena IUD patient.  Menstruation.  Unable to do pap today.  LLQ pain - intermittent.  Hx of endometriosis.   Plan: Yearly mammogram recommended after age 64.  Recommended self breast exam.  Pap and HR HPV as  above. Discussed Calcium, Vitamin D, regular exercise program including cardiovascular and weight bearing exercise. Labs performed.  Yes.  .   See orders. Refills given on medications.  No..    Colonoscopy due.  Has been declining ultrasound but know she is welcome to do this.  Follow up annually and prn.  Return for pap.   After visit summary provided.

## 2014-11-13 ENCOUNTER — Other Ambulatory Visit: Payer: Self-pay | Admitting: Obstetrics and Gynecology

## 2014-11-13 DIAGNOSIS — D72819 Decreased white blood cell count, unspecified: Secondary | ICD-10-CM

## 2014-11-18 ENCOUNTER — Ambulatory Visit (INDEPENDENT_AMBULATORY_CARE_PROVIDER_SITE_OTHER): Payer: 59 | Admitting: Obstetrics and Gynecology

## 2014-11-18 ENCOUNTER — Encounter: Payer: Self-pay | Admitting: Obstetrics and Gynecology

## 2014-11-18 VITALS — BP 130/78 | HR 80 | Ht 70.5 in | Wt 203.4 lb

## 2014-11-18 DIAGNOSIS — F411 Generalized anxiety disorder: Secondary | ICD-10-CM

## 2014-11-18 DIAGNOSIS — Z124 Encounter for screening for malignant neoplasm of cervix: Secondary | ICD-10-CM | POA: Diagnosis not present

## 2014-11-18 MED ORDER — PAROXETINE HCL 10 MG PO TABS: 10.0000 mg | ORAL_TABLET | ORAL | Status: DC

## 2014-11-18 NOTE — Progress Notes (Signed)
Patient ID: Sandra [name], female   DOB: 1966/02/20, 49 y.o.   MRN: [id number] GYNECOLOGY  VISIT   HPI: 49 y.o.   Married  Caucasian  female   G2P2002 with Patient's last menstrual period was 11/07/2014 (exact date). with Patient's last menstrual period was 11/07/2014 (exact date).   here for pap smear only--patient was bleeding too heavily last week at Annual for pap to be collected.  Patient would like to discuss symptoms of "a fog" feeling.  Feeling down, depressed and seems tearful today.  Requesting a referral to "someone" for help.   Feeling anxiety and worry.  Worried about her health and getting cancer and then not being around to care for her children.  This is a recurrent worry for the patient.  Recently had to do a diagnostic mammogram/ultrasound which were benign, and has a slightly low WBC.  Denies suicidal or homicidal ideation.  Has had Rx treatment in the past and does not remember what it was.  Would like help with counseling.   GYNECOLOGIC HISTORY: Patient's last menstrual period was 11/07/2014 (exact date). Contraception: Mirena IUD--placed 05/2011 Menopausal hormone therapy: none Last mammogram: 10/17/14 3D dense category C, Bi-rads category 0. Diagnostic mammogram and ultrasound - 10/20/14 - right breast benign cysts. Last pap smear:   09/2011 normal      OB History    Gravida Para Term Preterm AB TAB SAB Ectopic Multiple Living   2 2 2       2          Patient Active Problem List   Diagnosis Date Noted  . Menstrual headache 10/28/2013  . LLQ pain 10/28/2013  . Other and unspecified hyperlipidemia 02/11/2013    Past Medical History  Diagnosis Date  . Lichen sclerosus     vulva  . Endometriosis     Past Surgical History  Procedure Laterality Date  . Mandible reconstruction  1985  . Shinn surgery  1988  . Pelvic laparoscopy  2004    endometriosis  . Appendectomy  2005  . Cesarean section  2002    Current Outpatient Prescriptions  Medication Sig Dispense Refill  . clobetasol cream (TEMOVATE) 1.60 % Apply 1  application topically once a week. 30 g 1  . levonorgestrel (MIRENA) 20 MCG/24HR IUD 1 each by Intrauterine route once.     No current facility-administered medications for this visit.     ALLERGIES: Review of patient's allergies indicates no known allergies.  Family History  Problem Relation Age of Onset  . Stroke Mother   . Heart disease Mother     deceased  . Colon cancer Father   . Hypertension Brother     Social History   Social History  . Marital Status: Married    Spouse Name: N/A  . Number of Children: N/A  . Years of Education: N/A   Occupational History  . Not on file.   Social History Main Topics  . Smoking status: Never Smoker   . Smokeless tobacco: Not on file  . Alcohol Use: No  . Drug Use: No  . Sexual Activity:    Partners: Male    Birth Control/ Protection: IUD     Comment: Mirena inserted 05/2011   Other Topics Concern  . Not on file   Social History Narrative    ROS:  Pertinent items are noted in HPI.  PHYSICAL EXAMINATION:    BP 130/78 mmHg  Pulse 80  Ht 5' 10.5" (1.791 m)  Wt 203 lb 6.4 oz (92.262 kg)  BMI 28.76 kg/m2  LMP  11/07/2014 (Exact Date)    General appearance:  Tearful but appropriate.   Neurologic: Grossly normal  Pelvic: External genitalia:  no lesions              Urethra:  normal appearing urethra with no masses, tenderness or lesions              Bartholins and Skenes: normal                 Vagina: normal appearing vagina with normal color and discharge, no lesions              Cervix: no lesions              Pap taken: Yes.     Chaperone was present for exam.  ASSESSMENT  Encounter for pap smear.  Anxiety and some component of potetial depression. Slightly low WBC.    PLAN  Counseled regarding anxiety and ruminating thoughts.  Will prescribe Paxil 10 mg daily.  See orders.  Side effects discussed of decreased libido, potential for weight gain or difficulty loosing weight, and serotonin syndrome.  Card  given for patient to call Marya Amsler, counselor to establish care.  Also given names of Dr. Caprice Beaver and Dr. Toy Care to establish care.  Will reach out if feeling overwhelmed.  Follow up in 4 weeks for a recheck and check of CBC.  Patient told this is level fluctuates a lot, and that I am not worried about cancer but would like to recheck it.  She indicates understanding of our consultation today.   An After Visit Summary was printed and given to the patient.  __25____ minutes face to face time of which over 50% was spent in counseling.

## 2014-11-18 NOTE — Patient Instructions (Signed)
Paroxetine tablets What is this medicine? PAROXETINE (pa ROX e teen) is used to treat depression. It may also be used to treat anxiety disorders, obsessive compulsive disorder, panic attacks, post traumatic stress, and premenstrual dysphoric disorder (PMDD). This medicine may be used for other purposes; ask your health care provider or pharmacist if you have questions. COMMON BRAND NAME(S): Paxil, Pexeva What should I tell my health care provider before I take this medicine? They need to know if you have any of these conditions: -bleeding disorders -glaucoma -heart disease -kidney disease -liver disease -low levels of sodium in the blood -mania or bipolar disorder -seizures -suicidal thoughts, plans, or attempt; a previous suicide attempt by you or a family member -take MAOIs like Carbex, Eldepryl, Marplan, Nardil, and Parnate -take medicines that treat or prevent blood clots -an unusual or allergic reaction to paroxetine, other medicines, foods, dyes, or preservatives -pregnant or trying to get pregnant -breast-feeding How should I use this medicine? Take this medicine by mouth with a glass of water. Follow the directions on the prescription label. You can take it with or without food. Take your medicine at regular intervals. Do not take your medicine more often than directed. Do not stop taking this medicine suddenly except upon the advice of your doctor. Stopping this medicine too quickly may cause serious side effects or your condition may worsen. A special MedGuide will be given to you by the pharmacist with each prescription and refill. Be sure to read this information carefully each time. Talk to your pediatrician regarding the use of this medicine in children. Special care may be needed. Overdosage: If you think you have taken too much of this medicine contact a poison control center or emergency room at once. NOTE: This medicine is only for you. Do not share this medicine with  others. What if I miss a dose? If you miss a dose, take it as soon as you can. If it is almost time for your next dose, take only that dose. Do not take double or extra doses. What may interact with this medicine? Do not take this medicine with any of the following medications: -linezolid -MAOIs like Carbex, Eldepryl, Marplan, Nardil, and Parnate -methylene blue (injected into a vein) -pimozide -thioridazine This medicine may also interact with the following medications: -alcohol -aspirin and aspirin-like medicines -atomoxetine -certain medicines for depression, anxiety, or psychotic disturbances -certain medicines for irregular heart beat like propafenone, flecainide, encainide, and quinidine -certain medicines for migraine headache like almotriptan, eletriptan, frovatriptan, naratriptan, rizatriptan, sumatriptan, zolmitriptan -cimetidine -digoxin -diuretics -fentanyl -fosamprenavir/ritonavir -furazolidone -isoniazid -lithium -medicines that treat or prevent blood clots like warfarin, enoxaparin, and dalteparin -medicines for sleep -metoprolol -NSAIDs, medicines for pain and inflammation, like ibuprofen or naproxen -phenobarbital -phenytoin -procarbazine -procyclidine -rasagiline -supplements like St. John's wort, kava kava, valerian -tamoxifen -theophylline -tramadol -tryptophan This list may not describe all possible interactions. Give your health care provider a list of all the medicines, herbs, non-prescription drugs, or dietary supplements you use. Also tell them if you smoke, drink alcohol, or use illegal drugs. Some items may interact with your medicine. What should I watch for while using this medicine? Tell your doctor if your symptoms do not get better or if they get worse. Visit your doctor or health care professional for regular checks on your progress. Because it may take several weeks to see the full effects of this medicine, it is important to continue your  treatment as prescribed by your doctor. Patients and their families should watch  out for new or worsening thoughts of suicide or depression. Also watch out for sudden changes in feelings such as feeling anxious, agitated, panicky, irritable, hostile, aggressive, impulsive, severely restless, overly excited and hyperactive, or not being able to sleep. If this happens, especially at the beginning of treatment or after a change in dose, call your health care professional. Dennis Bast may get drowsy or dizzy. Do not drive, use machinery, or do anything that needs mental alertness until you know how this medicine affects you. Do not stand or sit up quickly, especially if you are an older patient. This reduces the risk of dizzy or fainting spells. Alcohol may interfere with the effect of this medicine. Avoid alcoholic drinks. Your mouth may get dry. Chewing sugarless gum or sucking hard candy, and drinking plenty of water will help. Contact your doctor if the problem does not go away or is severe. What side effects may I notice from receiving this medicine? Side effects that you should report to your doctor or health care professional as soon as possible: -allergic reactions like skin rash, itching or hives, swelling of the face, lips, or tongue -black or bloody stools, blood in the urine or vomit -fast, irregular heartbeat -hallucination, loss of contact with reality -painful or prolonged erection (men) -seizures -suicidal thoughts or other mood changes -trouble passing urine or change in the amount of urine -unusual bleeding or bruising -unusually weak or tired -vomiting Side effects that usually do not require medical attention (report to your doctor or health care professional if they continue or are bothersome): -change in appetite, weight -change in sex drive or performance -constipation or diarrhea -difficulty sleeping -drowsy -headache -increased sweating -muscle pain or weakness -tremors This  list may not describe all possible side effects. Call your doctor for medical advice about side effects. You may report side effects to FDA at 1-800-FDA-1088. Where should I keep my medicine? Keep out of the reach of children. Store at room temperature between 15 and 30 degrees C (59 and 86 degrees F). Keep container tightly closed. Throw away any unused medicine after the expiration date. NOTE: This sheet is a summary. It may not cover all possible information. If you have questions about this medicine, talk to your doctor, pharmacist, or health care provider.  2015, Elsevier/Gold Standard. (2011-10-31 18:10:02)   Please consider making an appointment with Dr. Letta Moynahan or Dr. Toy Care.

## 2014-11-23 LAB — IPS PAP TEST WITH HPV

## 2014-11-30 ENCOUNTER — Telehealth: Payer: Self-pay | Admitting: Obstetrics and Gynecology

## 2014-11-30 NOTE — Telephone Encounter (Signed)
Patient has appointment with Marya Amsler on 12/16/14 so she rescheduled her appointment with our office to 12/23/14. She will run out of Paxil and requests a refill until her appointment.   [facility].

## 2014-11-30 NOTE — Telephone Encounter (Signed)
Left message to call [place] at [phone].  Per review of Rx for Paxil 10 mg. [facility] [address].

## 2014-12-02 ENCOUNTER — Ambulatory Visit (INDEPENDENT_AMBULATORY_CARE_PROVIDER_SITE_OTHER): Payer: 59 | Admitting: Licensed Clinical Social Worker

## 2014-12-02 DIAGNOSIS — F321 Major depressive disorder, single episode, moderate: Secondary | ICD-10-CM

## 2014-12-07 NOTE — Telephone Encounter (Signed)
Call to patient to advise of refills remaining at pharmacy. Left patient voicemail to check with pharmacy for refills and to call us back if there any problems.   Routing to provider for final review. Will close encounter.

## 2014-12-16 ENCOUNTER — Ambulatory Visit: Payer: 59 | Admitting: Licensed Clinical Social Worker

## 2014-12-16 ENCOUNTER — Ambulatory Visit: Payer: 59 | Admitting: Obstetrics and Gynecology

## 2014-12-16 ENCOUNTER — Ambulatory Visit (INDEPENDENT_AMBULATORY_CARE_PROVIDER_SITE_OTHER): Payer: 59 | Admitting: Licensed Clinical Social Worker

## 2014-12-16 DIAGNOSIS — F332 Major depressive disorder, recurrent severe without psychotic features: Secondary | ICD-10-CM | POA: Diagnosis not present

## 2014-12-23 ENCOUNTER — Ambulatory Visit: Payer: 59 | Admitting: Obstetrics and Gynecology

## 2014-12-26 ENCOUNTER — Ambulatory Visit (INDEPENDENT_AMBULATORY_CARE_PROVIDER_SITE_OTHER): Payer: 59 | Admitting: Obstetrics and Gynecology

## 2014-12-26 ENCOUNTER — Encounter: Payer: Self-pay | Admitting: Obstetrics and Gynecology

## 2014-12-26 VITALS — BP 96/70 | HR 80 | Resp 16 | Ht 70.5 in | Wt 204.0 lb

## 2014-12-26 DIAGNOSIS — F411 Generalized anxiety disorder: Secondary | ICD-10-CM

## 2014-12-26 DIAGNOSIS — D72819 Decreased white blood cell count, unspecified: Secondary | ICD-10-CM

## 2014-12-26 LAB — CBC WITH DIFFERENTIAL/PLATELET
Basophils Absolute: 0 10*3/uL (ref 0.0–0.1)
Basophils Relative: 0 % (ref 0–1)
Eosinophils Absolute: 0.2 10*3/uL (ref 0.0–0.7)
Eosinophils Relative: 3 % (ref 0–5)
HCT: 39.9 % (ref 36.0–46.0)
Hemoglobin: 13.3 g/dL (ref 12.0–15.0)
Lymphocytes Relative: 27 % (ref 12–46)
Lymphs Abs: 1.8 10*3/uL (ref 0.7–4.0)
MCH: 30.1 pg (ref 26.0–34.0)
MCHC: 33.3 g/dL (ref 30.0–36.0)
MCV: 90.3 fL (ref 78.0–100.0)
MPV: 9.8 fL (ref 8.6–12.4)
Monocytes Absolute: 0.7 10*3/uL (ref 0.1–1.0)
Monocytes Relative: 10 % (ref 3–12)
Neutro Abs: 4 10*3/uL (ref 1.7–7.7)
Neutrophils Relative %: 60 % (ref 43–77)
Platelets: 287 10*3/uL (ref 150–400)
RBC: 4.42 MIL/uL (ref 3.87–5.11)
RDW: 13.4 % (ref 11.5–15.5)
WBC: 6.6 10*3/uL (ref 4.0–10.5)

## 2014-12-26 MED ORDER — PAROXETINE HCL 20 MG PO TABS: 10.0000 mg | ORAL_TABLET | ORAL | Status: DC

## 2014-12-26 NOTE — Progress Notes (Signed)
GYNECOLOGY  VISIT   HPI: 49 y.o.   Married  Caucasian  female   G2P2002 with Patient's last menstrual period was 12/20/2014.   here for  Medication follow up  - Paxil 10 mg daily.  Saw Marya Amsler. Had a good experience with her.  Needs a psychiatrist in the [facility] care network.   Some fatigue.  No other symptoms on the medication.   Feels her mood are stable.   Still not having a lot of motivation.  Not project oriented the way she is usually is.   No panic attacks.  Did blood work today to recheck her low WBC.  GYNECOLOGIC HISTORY: Patient's last menstrual period was 12/20/2014. Contraception: IUD  Menopausal hormone therapy: None Last mammogram: 10/20/14 [place] BIRADS2:[place]  Last pap smear: 11/18/14 Neg. HR HPV:neg        OB History    Gravida Para Term Preterm AB TAB SAB Ectopic Multiple Living   2 2 2       2          Patient Active Problem List   Diagnosis Date Noted  . Menstrual headache 10/28/2013  . LLQ pain 10/28/2013  . Other and unspecified hyperlipidemia 02/11/2013    Past Medical History  Diagnosis Date  . Lichen sclerosus     vulva  . Endometriosis     Past Surgical History  Procedure Laterality Date  . Mandible reconstruction  1985  . Shinn surgery  1988  . Pelvic laparoscopy  2004    endometriosis  . Appendectomy  2005  . Cesarean section  2002    Current Outpatient Prescriptions  Medication Sig Dispense Refill  . clobetasol cream (TEMOVATE) 0.94 % Apply 1 application topically once a week. 30 g 1  . levonorgestrel (MIRENA) 20 MCG/24HR IUD 1 each by Intrauterine route once.    Marland Kitchen PARoxetine (PAXIL) 10 MG tablet Take 1 tablet (10 mg total) by mouth every morning. 30 tablet 5   No current facility-administered medications for this visit.     ALLERGIES: Review of patient's allergies indicates no known allergies.  Family History  Problem Relation Age of Onset  . Stroke Mother   . Heart disease Mother     deceased  . Colon cancer  Father   . Hypertension Brother     Social History   Social History  . Marital Status: Married    Spouse Name: N/A  . Number of Children: N/A  . Years of Education: N/A   Occupational History  . Not on file.   Social History Main Topics  . Smoking status: Never Smoker   . Smokeless tobacco: Not on file  . Alcohol Use: No  . Drug Use: No  . Sexual Activity:    Partners: Male    Birth Control/ Protection: IUD     Comment: Mirena inserted 05/2011   Other Topics Concern  . Not on file   Social History Narrative    ROS:  Pertinent items are noted in HPI.  PHYSICAL EXAMINATION:    BP 96/70 mmHg  Pulse 80  Resp 16  Ht 5' 10.5" (1.791 m)  Wt 204 lb (92.534 kg)  BMI 28.85 kg/m2  LMP 12/20/2014    General appearance: alert, cooperative and appears stated age.  Calm.    ASSESSMENT  Anxiety.  Improved somewhat on Paxil. Low WBC.  PLAN  Counseled regarding anxiety and mood swings.  I will increase the Paxil to 20 mg daily.  See orders.  Patient will  continue with counseling and research to find a psychiatrist to prescribe for her as needed.  In the meantime, I will refill the Paxil for her.  She will contact me back through My Chart to report her response to the Paxil and the name of the psychiatrist she chose.  Will recheck CBC with differential today.   An After Visit Summary was printed and given to the patient.  ___15___ minutes face to face time of which over 50% was spent in counseling.

## 2014-12-30 ENCOUNTER — Ambulatory Visit (INDEPENDENT_AMBULATORY_CARE_PROVIDER_SITE_OTHER): Payer: 59 | Admitting: Licensed Clinical Social Worker

## 2014-12-30 DIAGNOSIS — F322 Major depressive disorder, single episode, severe without psychotic features: Secondary | ICD-10-CM

## 2015-01-06 ENCOUNTER — Other Ambulatory Visit: Payer: Self-pay | Admitting: Obstetrics and Gynecology

## 2015-01-06 NOTE — Telephone Encounter (Signed)
Dr. Quincy Simmonds, okay to place new order for Paxil 20 mg po daily #30 with 5 refills to reflect medication dose change?

## 2015-01-06 NOTE — Telephone Encounter (Addendum)
Patient calling in regards to Paxil rx that was sent 12/26/14 by Dr. Quincy Simmonds, patient says she was on 10 mg but Dr. Quincy Simmonds upped her dosage to 20 mg. The way the dosage is dispensed is at 10 mg, patient says pharmacy is only giving  her 15 tablets at a time since it is written for her to take 1/2 tablet daily. Patient says she is about to run out, please advise. Best contact # [phone] ; [facility]: [facility]

## 2015-01-09 MED ORDER — PAROXETINE HCL 20 MG PO TABS: 20.0000 mg | ORAL_TABLET | Freq: Every day | ORAL | Status: DC

## 2015-01-09 NOTE — Telephone Encounter (Signed)
OK to change prescription to Prozac 20 mg daily.  #30, RF 5.   Thank you!

## 2015-01-09 NOTE — Telephone Encounter (Signed)
Thank you for the correction to Paxil 20 mg daily. You are correct.

## 2015-01-09 NOTE — Telephone Encounter (Signed)
Order sent for Paxil 20 mg po daily #30 with 5 refills per Dr. Quincy Simmonds.    Message left to return call to [place] at [phone].  \

## 2015-01-20 ENCOUNTER — Ambulatory Visit (INDEPENDENT_AMBULATORY_CARE_PROVIDER_SITE_OTHER): Payer: 59 | Admitting: Licensed Clinical Social Worker

## 2015-01-20 DIAGNOSIS — F322 Major depressive disorder, single episode, severe without psychotic features: Secondary | ICD-10-CM

## 2015-02-17 ENCOUNTER — Ambulatory Visit: Payer: 59 | Admitting: Licensed Clinical Social Worker

## 2015-03-17 ENCOUNTER — Ambulatory Visit: Payer: 59 | Admitting: Licensed Clinical Social Worker

## 2015-04-02 DIAGNOSIS — E785 Hyperlipidemia, unspecified: Secondary | ICD-10-CM

## 2015-04-02 DIAGNOSIS — R7989 Other specified abnormal findings of blood chemistry: Secondary | ICD-10-CM

## 2015-04-02 HISTORY — DX: Hyperlipidemia, unspecified: E78.5

## 2015-04-02 HISTORY — DX: Other specified abnormal findings of blood chemistry: R79.89

## 2015-07-24 ENCOUNTER — Other Ambulatory Visit: Payer: Self-pay | Admitting: Obstetrics and Gynecology

## 2015-07-24 NOTE — Telephone Encounter (Signed)
Medication refill request: Paxil Last AEX:  11-11-14 Next AEX: 12-07-15 Last MMG (if hormonal medication request): cluster of cyst in the 2:30 region of the right breast- WNL  Refill authorized: please advise

## 2015-10-02 ENCOUNTER — Other Ambulatory Visit: Payer: Self-pay | Admitting: Obstetrics and Gynecology

## 2015-10-02 DIAGNOSIS — Z1231 Encounter for screening mammogram for malignant neoplasm of breast: Secondary | ICD-10-CM

## 2015-10-19 ENCOUNTER — Ambulatory Visit
Admission: RE | Admit: 2015-10-19 | Discharge: 2015-10-19 | Disposition: A | Discharge status: Home / Self Care | Payer: 59 | Source: Ambulatory Visit | Attending: Obstetrics and Gynecology | Admitting: Obstetrics and Gynecology

## 2015-10-19 DIAGNOSIS — Z1231 Encounter for screening mammogram for malignant neoplasm of breast: Secondary | ICD-10-CM

## 2015-10-23 ENCOUNTER — Other Ambulatory Visit: Payer: Self-pay | Admitting: Obstetrics and Gynecology

## 2015-10-23 DIAGNOSIS — R928 Other abnormal and inconclusive findings on diagnostic imaging of breast: Secondary | ICD-10-CM

## 2015-10-26 ENCOUNTER — Ambulatory Visit
Admission: RE | Admit: 2015-10-26 | Discharge: 2015-10-26 | Disposition: A | Discharge status: Home / Self Care | Payer: 59 | Source: Ambulatory Visit | Attending: Obstetrics and Gynecology | Admitting: Obstetrics and Gynecology

## 2015-10-26 DIAGNOSIS — R928 Other abnormal and inconclusive findings on diagnostic imaging of breast: Secondary | ICD-10-CM

## 2015-10-27 ENCOUNTER — Other Ambulatory Visit: Payer: Self-pay

## 2015-11-10 DIAGNOSIS — F3340 Major depressive disorder, recurrent, in remission, unspecified: Secondary | ICD-10-CM | POA: Diagnosis not present

## 2015-12-06 DIAGNOSIS — F3340 Major depressive disorder, recurrent, in remission, unspecified: Secondary | ICD-10-CM | POA: Diagnosis not present

## 2015-12-06 NOTE — Progress Notes (Signed)
50 y.o. G40P2002 Married Caucasian female here for annual exam.    Concern about possible sleep apnea.   Feeling better on Effexor.   Uses Clobetasol periodically.   Had menses in June and July this year.  Did not have for a long time.   PCP:   Kathyrn Lass, MD  Patient's last menstrual period was 10/14/2015 (approximate).     Period Pattern: (!) Irregular     Sexually active: Yes.   female The current method of family planning is IUD--Mirena inserted 05/2011.    Exercising: No.   Smoker:  no  Health Maintenance: Pap: 11-18-14 Neg:Neg HR HPV History of abnormal Pap:  no MMG:  10-19-15 Density C/asymmetry Rt.breast;Lt.breast neg.--Diag.Rt.mmg and U/S reveals Rt.breast clusters of cysts,no evidence of malignancy/screening 56yr/[facility] Colonoscopy:  2011 possible polyps with Dr. Hildred Priest due 2016/2017. BMD:   n/a  Result  n/a TDaP: uncertain per patient.  Desires this today.  Gardasil:   N/A Screening Labs:   Urine today: unable to void   reports that she has never smoked. She has never used smokeless tobacco. She reports that she does not drink alcohol or use drugs.  Past Medical History:  Diagnosis Date  . Endometriosis   . Lichen sclerosus    vulva    Past Surgical History:  Procedure Laterality Date  . APPENDECTOMY  2005  . CESAREAN SECTION  2002  . MANDIBLE RECONSTRUCTION  1985  . PELVIC LAPAROSCOPY  2004   endometriosis  . shinn surgery  1988    Current Outpatient Prescriptions  Medication Sig Dispense Refill  . clobetasol cream (TEMOVATE) [id number] % Apply 1 application topically once a week. 30 g 1  . levonorgestrel (MIRENA) 20 MCG/24HR IUD 1 each by Intrauterine route once.    . venlafaxine XR (EFFEXOR-XR) 75 MG 24 hr capsule Take 2 capsules by mouth daily.  1   No current facility-administered medications for this visit.     Family History  Problem Relation Age of Onset  . Stroke Mother   . Heart disease Mother     deceased  . Colon  cancer Father   . Hypertension Brother     ROS:  Pertinent items are noted in HPI.  Otherwise, a comprehensive ROS was negative.  Exam:   BP 126/72 (BP Location: Right Arm, Patient Position: Sitting, Cuff Size: Large)   Pulse 76   Resp 14   Ht 5' 10.5" (1.791 m)   Wt 212 lb 9.6 oz (96.4 kg)   LMP 10/14/2015 (Approximate)   BMI 30.07 kg/m     General appearance: alert, cooperative and appears stated age Head: Normocephalic, without obvious abnormality, atraumatic Neck: no adenopathy, supple, symmetrical, trachea midline and thyroid normal to inspection and palpation Lungs: clear to auscultation bilaterally Breasts: normal appearance, no masses or tenderness, No nipple retraction or dimpling, No nipple discharge or bleeding, No axillary or supraclavicular adenopathy Heart: regular rate and rhythm Abdomen: soft, non-tender; no masses, no organomegaly Extremities: extremities normal, atraumatic, no cyanosis or edema Skin: Skin color, texture, turgor normal. No rashes or lesions Lymph nodes: Cervical, supraclavicular, and axillary nodes normal. No abnormal inguinal nodes palpated Neurologic: Grossly normal  Pelvic: External genitalia: hypopigmentation of perineal area.               Urethra:  normal appearing urethra with no masses, tenderness or lesions              Bartholins and Skenes: normal  Vagina: normal appearing vagina with normal color and discharge, no lesions              Cervix: no lesions.  IUD strings possibly seen.  (Prior pelvic U/S in 01/2013 documenting IUD in endometrial canal.)              Pap taken: No. Bimanual Exam:  Uterus:  normal size, contour, position, consistency, mobility, non-tender              Adnexa: no mass, fullness, tenderness              Rectal exam: Yes.  .  Confirms.              Anus:  normal sphincter tone,  Hemorrhoid at 12:00.   Chaperone was present for exam.  Assessment:   Well woman visit with normal exam. Mirena  IUD patient.  Hx lichen sclerosus. Possible hemorrhoid.  Sleep apnea?  Anxiety controlled on Effexor.   Plan: Yearly mammogram recommended after age 80.  Recommended self breast exam.  Pap and HR HPV as above. Guidelines for Calcium, Vitamin D, regular exercise program including cardiovascular and weight bearing exercise. Tdap today.  Routine labs.  Referral to neurology for sleep evaluation.  Colonoscopy due.  Patient will call.  Refill of Clobetasol.  Plan for IUD exchange in Jan or Feb 2018.  Patient will call ahead in January to our office so we can get insurance authorization for this.  Follow up annually and prn.      After visit summary provided.

## 2015-12-07 ENCOUNTER — Ambulatory Visit (INDEPENDENT_AMBULATORY_CARE_PROVIDER_SITE_OTHER): Payer: BLUE CROSS/BLUE SHIELD | Admitting: Obstetrics and Gynecology

## 2015-12-07 ENCOUNTER — Encounter: Payer: Self-pay | Admitting: Obstetrics and Gynecology

## 2015-12-07 VITALS — BP 126/72 | HR 76 | Resp 14 | Ht 70.5 in | Wt 212.6 lb

## 2015-12-07 DIAGNOSIS — G4730 Sleep apnea, unspecified: Secondary | ICD-10-CM | POA: Diagnosis not present

## 2015-12-07 DIAGNOSIS — Z01419 Encounter for gynecological examination (general) (routine) without abnormal findings: Secondary | ICD-10-CM

## 2015-12-07 DIAGNOSIS — Z23 Encounter for immunization: Secondary | ICD-10-CM

## 2015-12-07 DIAGNOSIS — R7989 Other specified abnormal findings of blood chemistry: Secondary | ICD-10-CM | POA: Diagnosis not present

## 2015-12-07 LAB — COMPREHENSIVE METABOLIC PANEL
ALT: 24 U/L (ref 6–29)
AST: 19 U/L (ref 10–35)
Albumin: 4.1 g/dL (ref 3.6–5.1)
Alkaline Phosphatase: 44 U/L (ref 33–115)
BUN: 9 mg/dL (ref 7–25)
CO2: 28 mmol/L (ref 20–31)
Calcium: 8.9 mg/dL (ref 8.6–10.2)
Chloride: 103 mmol/L (ref 98–110)
Creat: 0.76 mg/dL (ref 0.50–1.10)
Glucose, Bld: 88 mg/dL (ref 65–99)
Potassium: 4.4 mmol/L (ref 3.5–5.3)
Sodium: 138 mmol/L (ref 135–146)
Total Bilirubin: 0.5 mg/dL (ref 0.2–1.2)
Total Protein: 6.7 g/dL (ref 6.1–8.1)

## 2015-12-07 LAB — CBC
HCT: 38.8 % (ref 35.0–45.0)
Hemoglobin: 13.2 g/dL (ref 11.7–15.5)
MCH: 30.6 pg (ref 27.0–33.0)
MCHC: 34 g/dL (ref 32.0–36.0)
MCV: 89.8 fL (ref 80.0–100.0)
MPV: 9.3 fL (ref 7.5–12.5)
Platelets: 206 10*3/uL (ref 140–400)
RBC: 4.32 MIL/uL (ref 3.80–5.10)
RDW: 13.3 % (ref 11.0–15.0)
WBC: 4.8 10*3/uL (ref 3.8–10.8)

## 2015-12-07 LAB — LIPID PANEL
Cholesterol: 236 mg/dL — ABNORMAL HIGH (ref 125–200)
HDL: 53 mg/dL (ref >=46)
LDL Cholesterol: 155 mg/dL — ABNORMAL HIGH (ref <130)
Total CHOL/HDL Ratio: 4.5 Ratio (ref <=5.0)
Triglycerides: 139 mg/dL (ref <150)
VLDL: 28 mg/dL (ref <30)

## 2015-12-07 MED ORDER — CLOBETASOL PROPIONATE 0.05 % EX CREA: 1.0000 "application " | TOPICAL_CREAM | CUTANEOUS | 1 refills | Status: DC

## 2015-12-07 NOTE — Patient Instructions (Signed)

## 2015-12-08 LAB — VITAMIN D 25 HYDROXY (VIT D DEFICIENCY, FRACTURES): Vit D, 25-Hydroxy: 24 ng/mL — ABNORMAL LOW (ref 30–100)

## 2015-12-09 ENCOUNTER — Encounter: Payer: Self-pay | Admitting: Obstetrics and Gynecology

## 2015-12-09 MED ORDER — VITAMIN D (ERGOCALCIFEROL) 1.25 MG (50000 UNIT) PO CAPS: 50000.0000 [IU] | ORAL_CAPSULE | ORAL | 0 refills | Status: DC

## 2016-01-08 ENCOUNTER — Ambulatory Visit (INDEPENDENT_AMBULATORY_CARE_PROVIDER_SITE_OTHER): Payer: BLUE CROSS/BLUE SHIELD | Admitting: Neurology

## 2016-01-08 ENCOUNTER — Encounter: Payer: Self-pay | Admitting: Neurology

## 2016-01-08 ENCOUNTER — Telehealth: Payer: Self-pay | Admitting: Obstetrics and Gynecology

## 2016-01-08 VITALS — BP 120/70 | HR 80 | Resp 20 | Ht 70.5 in | Wt 209.0 lb

## 2016-01-08 DIAGNOSIS — R51 Headache: Secondary | ICD-10-CM | POA: Diagnosis not present

## 2016-01-08 DIAGNOSIS — G4730 Sleep apnea, unspecified: Secondary | ICD-10-CM | POA: Diagnosis not present

## 2016-01-08 DIAGNOSIS — R0683 Snoring: Secondary | ICD-10-CM

## 2016-01-08 DIAGNOSIS — R519 Headache, unspecified: Secondary | ICD-10-CM | POA: Insufficient documentation

## 2016-01-08 DIAGNOSIS — F458 Other somatoform disorders: Secondary | ICD-10-CM | POA: Diagnosis not present

## 2016-01-08 NOTE — Telephone Encounter (Signed)
Patient requesting refill of Temovate.  Patient also says that the pharmacy said it will need prior auth.  [facility]

## 2016-01-08 NOTE — Telephone Encounter (Signed)
Call to the pharmacy. Per pharmacy technician the patient has 1 refill on her Clobetasol cream. This will require PA.   Lovena Le, could you help me with this?

## 2016-01-08 NOTE — Progress Notes (Signed)
[facility]   Provider:  Larey Seat, M D  Referring Provider: Kathyrn Lass, MD Primary Care Physician:  Josefa Half, MD  Chief Complaint  Patient presents with  . New Patient (Initial Visit)    snores, never had a sleep study    HPI:  Sandra [name] is a 50 y.o. female , seen here as a referral  from Dr. Hosie Poisson, her gynecologist, for evaluation of possible sleep apnea.  Chief complaint according to patient :The patient reports that her husband has been concerned about her snoring and pausing in her nocturnal breathing.  Sandra [name] is overall very healthy individual with a history of anxiety, controlled on Effexor. A family history of colon cancer in her father, and she is wearing an IUD for control of dysmenorrhagia. 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 She is a married mother of 2, her daughters are 64 and soon to be 69.  Sleep habits are as follows: She usually goes to bed at 8 15 Pm and falls asleep by 9 PM, usually sleeps through, occassionally has one wake up period at 1.30 AM - nocturia, but not frequently. She rises in the morning at 5:30, and estimate that she will get 7-8 hours of nocturnal sleep. She does not struggle with rising and usually is spontaneously up before her alarm clock rings. She feels refreshed and restored in the morning. She will have an early morning breakfast with her daughters before bringing them to school. She falls asleep on her side, when she rolls over in her sleep and resumes a supine position is when her snoring is the loudest, according to her husband. She describes the marital bedroom is cool, quiet and dark.  Sleep medical history and family sleep history: There is no history of neck surgery or trauma, ENT procedures, traumatic brain injury or head injury. Father was a loud snorer, but was never formally evaluated for possible apnea.  Social history: Married mother of  2, full time gainfully employed,She does not drink coffee, she will have occasionally a cola, does not drink ice tea. No tobacco use ever, No ETOH use, quit 1993 , years ago.   Review of Systems: Out of a complete 14 system review, the patient complains of only the following symptoms, and all other reviewed systems are negative.  Epworth score 9 , Fatigue severity score 34  , depression score n/a on treatment . Anxiety manifested itself as a sense of doom, fear, joyless.    Social History   Social History  . Marital status: Married    Spouse name: N/A  . Number of children: N/A  . Years of education: N/A   Occupational History  . Not on file.   Social History Main Topics  . Smoking status: Never Smoker  . Smokeless tobacco: Never Used  . Alcohol use No  . Drug use: No  . Sexual activity: Yes    Partners: Male    Birth control/ protection: IUD     Comment: Mirena inserted 05/2011   Other Topics Concern  . Not on file   Social History Narrative  . No narrative on file    Family History  Problem Relation Age of Onset  . Stroke Mother   . Heart disease Mother     deceased  . Colon cancer Father   . Hypertension Brother     Past Medical History:  Diagnosis Date  .  Endometriosis   . Hyperlipidemia 2107  . Lichen sclerosus    vulva  . Low serum vitamin D 2017    Past Surgical History:  Procedure Laterality Date  . APPENDECTOMY  2005  . CESAREAN SECTION  2002  . MANDIBLE RECONSTRUCTION  1985  . PELVIC LAPAROSCOPY  2004   endometriosis  . shinn surgery  1988    Current Outpatient Prescriptions  Medication Sig Dispense Refill  . clobetasol cream (TEMOVATE) [id number] % Apply 1 application topically once a week. 30 g 1  . levonorgestrel (MIRENA) 20 MCG/24HR IUD 1 each by Intrauterine route once.    . venlafaxine XR (EFFEXOR-XR) 75 MG 24 hr capsule Take 2 capsules by mouth daily.  1  . Vitamin D, Ergocalciferol, (DRISDOL) 50000 units CAPS capsule Take 1 capsule  (50,000 Units total) by mouth every 14 (fourteen) days. 6 capsule 0   No current facility-administered medications for this visit.     Allergies as of 01/08/2016  . (No Known Allergies)    Vitals: BP 120/70   Pulse 80   Resp 20   Ht 5' 10.5" (1.791 m)   Wt 209 lb (94.8 kg)   BMI 29.56 kg/m  Last Weight:  Wt Readings from Last 1 Encounters:  01/08/16 209 lb (94.8 kg)   [id number] mass index is 29.56 kg/m.     Last Height:   Ht Readings from Last 1 Encounters:  01/08/16 5' 10.5" (1.791 m)    Physical exam:  General: The patient is awake, alert and appears not in acute distress. The patient is well groomed. Head: Normocephalic, atraumatic. Neck is supple. Mallampati 3, elongated uvula.  ,  neck circumference: 15.5  Nasal airflow patent  TMJ click  evident. Underbite , status post surgery .  Cardiovascular:  Regular rate and rhythm , without  murmurs or carotid bruit, and without distended neck veins. Respiratory: Lungs are clear to auscultation. Skin:  Without evidence of edema, or rash Trunk: The patient's posture is erect   Neurologic exam : The patient is awake and alert, oriented to place and time.   Memory subjective described as intact.  Attention span & concentration ability appears normal.  Speech is fluent,  without dysarthria, dysphonia or aphasia.  Mood and affect are appropriate.  Cranial nerves: Pupils are equal and briskly reactive to light. Funduscopic exam without evidence of pallor or edema. Extraocular movements  in vertical and horizontal planes intact and without nystagmus. Visual fields by finger perimetry are intact.Hearing to finger rub intact. Facial sensation intact to fine touch. Facial motor strength is symmetric and tongue and uvula move midline. Shoulder shrug was symmetrical.   Motor exam:   Normal tone, muscle bulk and symmetric strength in all extremities.  Sensory:  Fine touch, pinprick and vibration , proprioception tested in the upper  extremities was normal.  Coordination: Rapid alternating movements in the fingers/hands was normal. Finger-to-nose maneuver without evidence of ataxia, dysmetria or tremor.  Gait and station: Patient walks without assistive device and is able unassisted to climb up to the exam table. Strength within normal limits.  Stance is stable and normal.   Deep tendon reflexes: in the  upper and lower extremities are symmetric and intact. Babinski maneuver response is downgoing.  The patient was advised of the nature of the diagnosed sleep disorder , the treatment options and risks for general a health and wellness arising from not treating the condition.  I spent more than 45 minutes of face to face time with  the patient. Greater than 50% of time was spent in counseling and coordination of care. We have discussed the diagnosis and differential and I answered the patient's questions.     Assessment:  After physical and neurologic examination, review of laboratory studies,  Personal review of imaging studies, reports of other /same  Imaging studies ,  Results of polysomnography/ neurophysiology testing and pre-existing records as far as provided in visit., my assessment is   1) Sandra [name] has not experienced herself to snore, but was advised by her husband that this is the case. It seems to be worse when she is in supine position and her upper airway explains some of these findings. I would be quite surprised if she would be a severe apnea patient and I suspect that she is a positional snore only. I would like for her to undergo a sleep study to evaluate  her for apnea, and order a Split night PSG.   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 Sandra [name] also has a history of bruxism often clenches her teeth, causing her sometimes to have jaw pain, ongoing TMJ click. Morning headaches are usually related to bruxism.  Plan:  Treatment plan and additional workup :  SPLIT PSG and RV after  study,     Larey Seat MD  01/08/2016   CC: Kathyrn Lass, [place],  [id number]

## 2016-01-09 NOTE — Telephone Encounter (Signed)
I will work on this now. Thanks. -tam

## 2016-01-10 DIAGNOSIS — F3340 Major depressive disorder, recurrent, in remission, unspecified: Secondary | ICD-10-CM | POA: Diagnosis not present

## 2016-01-19 NOTE — Telephone Encounter (Signed)
Patient is calling to check on status of PA.

## 2016-01-22 NOTE — Telephone Encounter (Signed)
Waiting on response for PA. Another one was done with the appropriate form. Spoke with patient to notify of this.

## 2016-01-28 ENCOUNTER — Ambulatory Visit (INDEPENDENT_AMBULATORY_CARE_PROVIDER_SITE_OTHER): Payer: BLUE CROSS/BLUE SHIELD | Admitting: Neurology

## 2016-01-28 DIAGNOSIS — G4730 Sleep apnea, unspecified: Secondary | ICD-10-CM

## 2016-01-28 DIAGNOSIS — R519 Headache, unspecified: Secondary | ICD-10-CM

## 2016-01-28 DIAGNOSIS — F458 Other somatoform disorders: Secondary | ICD-10-CM

## 2016-01-28 DIAGNOSIS — R0683 Snoring: Secondary | ICD-10-CM

## 2016-01-28 DIAGNOSIS — R51 Headache: Principal | ICD-10-CM

## 2016-01-30 ENCOUNTER — Encounter: Payer: Self-pay | Admitting: *Deleted

## 2016-01-31 NOTE — Telephone Encounter (Signed)
Patient called to check on the status of the PA. °

## 2016-02-01 NOTE — Telephone Encounter (Signed)
Patient called to check on the status of a prior authorization. She is upset because this is her third call about this medication.

## 2016-02-01 NOTE — Telephone Encounter (Signed)
Sandra [name], any f/u on this PA?

## 2016-02-02 ENCOUNTER — Telehealth: Payer: Self-pay | Admitting: Neurology

## 2016-02-02 NOTE — Telephone Encounter (Signed)
[facility], confirmed PA needed for clobestasol cream.    CC: Dr. Quincy Simmonds

## 2016-02-02 NOTE — Telephone Encounter (Signed)
Per the rep I spoke with on CoverMyMeds, patient's plan stated that the medication is on patient's list of covered drugs and PA was not needed. Has the patient checked with the pharmacy?

## 2016-02-02 NOTE — Telephone Encounter (Signed)
   Please share result s with patient./  The sleep study from 01-28-2016 revealed no apnea , PLM disorder, hypoxemia or parasomnia. Al normal results, mild snoring. If desired , will refer for snoring treatment to dental device lab, but no need to use CPAP , oxygen etc. CD

## 2016-02-05 NOTE — Telephone Encounter (Signed)
I spoke to pt. I advised her that her sleep study results revealed mild snoring, but no apnea, PLM disorder, hypoxemia, or parasomnia. If pt desires, we can refer pt to a dentist for snoring treatment. Pt says that she has an appt with her dentist coming up and will discuss it with him at that time. Pt declined a follow up with Dr. Brett Fairy at this time. Pt verbalized understanding of results. Pt had no questions at this time but was encouraged to call back if questions arise.

## 2016-02-05 NOTE — Telephone Encounter (Signed)
I called pt to discuss sleep study results. No answer, left a message asking her to call me back. 

## 2016-02-05 NOTE — Telephone Encounter (Signed)
Spoke with [facility] to verify insurance that medication - clobetasol cream is billed to [employer].

## 2016-02-06 NOTE — Telephone Encounter (Signed)
New PA submitted for [employer].

## 2016-02-06 NOTE — Telephone Encounter (Signed)
Left message to call [name] at [phone].  

## 2016-02-06 NOTE — Telephone Encounter (Signed)
Patient called to check on the status of this request.

## 2016-02-07 ENCOUNTER — Telehealth: Payer: Self-pay | Admitting: Obstetrics and Gynecology

## 2016-02-07 NOTE — Telephone Encounter (Signed)
See open telephone encounter dated 01/08/16, will close this encounter.

## 2016-02-07 NOTE — Telephone Encounter (Signed)
Patient says she is returning Jill's call. °

## 2016-02-07 NOTE — Telephone Encounter (Signed)
An alternative for her is:  Betamethasone valerate topical ointment 0.1%.   Apply q day to bid for 1 - 2 weeks as needed.   Dispense 45 gram, RF one.  This is a moderate-high potency steroid ointment.   Please finalize plan and close encounter.

## 2016-02-07 NOTE — Telephone Encounter (Addendum)
Spoke with Levada Dy at [facility]. RN inquired reason for denial of PA of clobetasol cream-was advised "restricted access, which means 2 medication alternatives have not been tried and failed prior". Was advised by Levada Dy that no payout for clobetasol on filled prescription 12/15/15.  Advised will notify patient.   Cc: Terence Lux

## 2016-02-07 NOTE — Telephone Encounter (Signed)
Spoke with patient. Patient checking on status of PA for clobetasol cream. Advised patient new PA submitted 11/7 through [place] at [email or web address]. Explained to patient first PA was submitted to [facility].  Advised patient prior to telephone call RN checked status of PA through [email or web address]- status pending. Advised patient once notification received via fax will return call to patient. Patient is agreeable.

## 2016-02-07 NOTE — Telephone Encounter (Signed)
Spoke with patient, advised as seen below. Patient states she went ahead and picked up medication under [facility] plan. Patient sates after December 2017 she will no longer have that insurance plan. Patient states she will need an alternative to clobetasol cream at that point. Advised would send to Dr. Quincy Simmonds for review and if any additional recommendations will return call. Patient is agreeable.   Dr. Quincy Simmonds, alternative for clobetasol cream?

## 2016-02-08 DIAGNOSIS — Z1211 Encounter for screening for malignant neoplasm of colon: Secondary | ICD-10-CM | POA: Diagnosis not present

## 2016-02-08 DIAGNOSIS — K640 First degree hemorrhoids: Secondary | ICD-10-CM | POA: Diagnosis not present

## 2016-02-08 DIAGNOSIS — Z800 Family history of malignant neoplasm of digestive organs: Secondary | ICD-10-CM | POA: Diagnosis not present

## 2016-02-08 MED ORDER — BETAMETHASONE VALERATE 0.1 % EX OINT: TOPICAL_OINTMENT | CUTANEOUS | 1 refills | Status: DC

## 2016-02-08 NOTE — Telephone Encounter (Signed)
Spoke with patient. Advised as seen below per Dr. Elza Rafter. Patient states she picked up her last RF for clobetasol cream and paid out of pocket, but will plan to switch to the betamethasone valerate ointment when clobetasol runs out. [facility] Patient verbalizes understanding and is agreeable.   Routing to provider for final review. Patient is agreeable to disposition. Will close encounter.

## 2016-02-26 ENCOUNTER — Telehealth: Payer: Self-pay | Admitting: *Deleted

## 2016-02-26 NOTE — Telephone Encounter (Signed)
Please notify the patient so she knows she has a choice.  Either the Valisone or the Clobetasol are good options.

## 2016-02-26 NOTE — Telephone Encounter (Signed)
Dr. Quincy Simmonds, the clobetasol cream was denied and initially placed new order for Valisone. Clobetasol ointment is covered, would you like me to notify patient and change to clobetasol ointment? Or keep Valisone?Please advise?

## 2016-02-27 MED ORDER — CLOBETASOL PROPIONATE 0.05 % EX OINT: TOPICAL_OINTMENT | CUTANEOUS | 1 refills | Status: DC

## 2016-02-27 NOTE — Telephone Encounter (Signed)
I signed the order for the Clobetasol.  You may close the encounter.

## 2016-02-27 NOTE — Telephone Encounter (Signed)
Left message to call Skylor Hughson at [phone].  

## 2016-02-27 NOTE — Telephone Encounter (Signed)
Spoke with patient, advised as seen below per Dr. Quincy Simmonds. Patient states she would like to compare cost of the 2 medications and decide. Order placed for Clobetasol Ointment 0000000- apply one application topically once a week -patient to return call after comparing cost at pharmacy, will discontinue the more expensive option. Patient verbalizes understanding and is agreeable.

## 2016-03-06 DIAGNOSIS — F3340 Major depressive disorder, recurrent, in remission, unspecified: Secondary | ICD-10-CM | POA: Diagnosis not present

## 2016-03-22 DIAGNOSIS — F4321 Adjustment disorder with depressed mood: Secondary | ICD-10-CM | POA: Diagnosis not present

## 2016-04-13 DIAGNOSIS — F4321 Adjustment disorder with depressed mood: Secondary | ICD-10-CM | POA: Diagnosis not present

## 2016-04-15 ENCOUNTER — Telehealth: Payer: Self-pay | Admitting: *Deleted

## 2016-04-15 DIAGNOSIS — Z30432 Encounter for removal of intrauterine contraceptive device: Secondary | ICD-10-CM

## 2016-04-15 DIAGNOSIS — Z30430 Encounter for insertion of intrauterine contraceptive device: Secondary | ICD-10-CM

## 2016-04-15 MED ORDER — MISOPROSTOL 200 MCG PO TABS: ORAL_TABLET | ORAL | 0 refills | Status: DC

## 2016-04-15 NOTE — Telephone Encounter (Signed)
-----   Message from Nunzio Cobbs, MD sent at 04/14/2016  5:46 PM EST ----- Regarding: Please contact patient to remind her of her Vit D lab check Hi Sandra [name],  This patient came up in my EPIC reminder box.   Please contact her to schedule a lab visit.  She is overdue for a vit D recheck.   Thanks,   Brook  ----- Message ----- From: SYSTEM Sent: 04/14/2016  12:06 AM To: Nunzio Cobbs, MD

## 2016-04-15 NOTE — Telephone Encounter (Signed)
Spoke with patient, advised as seen below per Dr. Quincy Simmonds. Patient states she is due to have Mirena IUD removed and reinserted 05/2016 would like to have Vit D drawn on same day. Patient scheduled for Mirena IUD insertion/removal on 05/08/16 -patient declined earlier appointments. Advised Cytotec 200 mcg  Pace 1 tablet vaginal the night before procedure. Place 1 tablet vaginal the morning of the procedure. Motrin 800 mg with food and water one hour before procedure.Eat and hydrate well. Patient verbalizes understanding and is agreeable to date and time.  Routing to provider for final review. Patient is agreeable to disposition. Will close encounter.  Cc: Theresia Lo, Lerry Liner

## 2016-05-08 ENCOUNTER — Ambulatory Visit: Payer: Self-pay | Admitting: Obstetrics and Gynecology

## 2016-05-08 DIAGNOSIS — F3340 Major depressive disorder, recurrent, in remission, unspecified: Secondary | ICD-10-CM | POA: Diagnosis not present

## 2016-05-13 ENCOUNTER — Encounter: Payer: Self-pay | Admitting: Obstetrics and Gynecology

## 2016-05-13 ENCOUNTER — Ambulatory Visit (INDEPENDENT_AMBULATORY_CARE_PROVIDER_SITE_OTHER): Payer: BLUE CROSS/BLUE SHIELD | Admitting: Obstetrics and Gynecology

## 2016-05-13 VITALS — BP 120/64 | HR 88 | Ht 70.5 in | Wt 205.8 lb

## 2016-05-13 DIAGNOSIS — Z30432 Encounter for removal of intrauterine contraceptive device: Secondary | ICD-10-CM | POA: Diagnosis not present

## 2016-05-13 DIAGNOSIS — Z3009 Encounter for other general counseling and advice on contraception: Secondary | ICD-10-CM

## 2016-05-13 DIAGNOSIS — R7989 Other specified abnormal findings of blood chemistry: Secondary | ICD-10-CM

## 2016-05-13 MED ORDER — MISOPROSTOL 200 MCG PO TABS: ORAL_TABLET | ORAL | 0 refills | Status: DC

## 2016-05-13 NOTE — Progress Notes (Signed)
GYNECOLOGY  VISIT   HPI: 51 y.o.   Married  Caucasian  female   G2P2002 with No LMP recorded. Patient is not currently having periods (Reason: IUD).   here for Mirena IUD removal and reinsertion.    Had ultrasound in Nov. 2014 documenting IUD in endometrial canal.  Strings have been difficult to see on pelvic exam.  Took Cytotec in preparation for visit today.  Took Tylenol in preparation for visit.   Had vit D rechecked today.  Wants cholesterol rechecked again today but did not fast.   GYNECOLOGIC HISTORY: No LMP recorded. Patient is not currently having periods (Reason: IUD). Contraception:  Mirena IUD inserted 05/2011 Menopausal hormone therapy:  n/a Last mammogram:  10-19-15 Density C/Rt.Br.poss.asymmetry warrants further evaluation, Lt.Br.Neg. 10-25-16 Rt.Br.Diag.and U/S reveal Rt.br.clusters of cyst--neg/BiRads2:TBC Last pap smear:   11-18-14 Neg:Neg HR HPV, 09/2011 Neg:Neg HR HPV        OB History    Gravida Para Term Preterm AB Living   2 2 2     2    SAB TAB Ectopic Multiple Live Births                     Patient Active Problem List   Diagnosis Date Noted  . Sleep related headaches 01/08/2016  . Bruxism 01/08/2016  . Snoring 01/08/2016  . Sleep apnea 01/08/2016  . Menstrual headache 10/28/2013  . LLQ pain 10/28/2013  . Other and unspecified hyperlipidemia 02/11/2013    Past Medical History:  Diagnosis Date  . Endometriosis   . Hyperlipidemia 2107  . Lichen sclerosus    vulva  . Low serum vitamin D 2017    Past Surgical History:  Procedure Laterality Date  . APPENDECTOMY  2005  . CESAREAN SECTION  2002  . MANDIBLE RECONSTRUCTION  1985  . PELVIC LAPAROSCOPY  2004   endometriosis  . shinn surgery  1988    Current Outpatient Prescriptions  Medication Sig Dispense Refill  . clobetasol cream (TEMOVATE) [id number] % APPLY 1 APPLICATION TOPICALLY ONCE A WEEK.--INS LIMIT QTY  1  . lamoTRIgine (LAMICTAL) 25 MG tablet Take 1 tablet by mouth daily.  0  .  levonorgestrel (MIRENA) 20 MCG/24HR IUD 1 each by Intrauterine route once.    . misoprostol (CYTOTEC) 200 MCG tablet Place one tablet vaginally night before procedure and place one tablet vaginally the morning of procedure. 2 tablet 0  . Omega-3 Fatty Acids (FISH OIL) 1000 MG CAPS Take 1 capsule by mouth daily.    Marland Kitchen venlafaxine XR (EFFEXOR-XR) 150 MG 24 hr capsule Take 150 mg by mouth daily.  0  . Vitamin D, Ergocalciferol, (DRISDOL) 50000 units CAPS capsule Take 1 capsule (50,000 Units total) by mouth every 14 (fourteen) days. 6 capsule 0  . betamethasone valerate ointment (VALISONE) 0.1 % Apply q day to bid for 1-2 weeks as needed (Patient not taking: Reported on 05/13/2016) 45 g 1   No current facility-administered medications for this visit.      ALLERGIES: Patient has no known allergies.  Family History  Problem Relation Age of Onset  . Stroke Mother   . Heart disease Mother     deceased  . Colon cancer Father   . Hypertension Brother     Social History   Social History  . Marital status: Married    Spouse name: N/A  . Number of children: N/A  . Years of education: N/A   Occupational History  . Not on file.   Social History Main  Topics  . Smoking status: Never Smoker  . Smokeless tobacco: Never Used  . Alcohol use No  . Drug use: No  . Sexual activity: Yes    Partners: Male    Birth control/ protection: IUD     Comment: Mirena inserted 05/2011   Other Topics Concern  . Not on file   Social History Narrative  . No narrative on file    ROS:  Pertinent items are noted in HPI.  PHYSICAL EXAMINATION:    BP 120/64 (BP Location: Right Arm, Patient Position: Sitting, Cuff Size: Large)   Pulse 88   Ht 5' 10.5" (1.791 m)   Wt 205 lb 12.8 oz (93.4 kg)   BMI 29.11 kg/m     General appearance: alert, cooperative and appears stated age   Pelvic: External genitalia:  no lesions              Urethra:  normal appearing urethra with no masses, tenderness or lesions               Bartholins and Skenes: normal                 Vagina: normal appearing vagina with normal color and discharge, no lesions              Cervix: no lesions                Bimanual Exam:  Uterus:  normal size, contour, position, consistency, mobility, non-tender              Adnexa: no mass, fullness, tenderness               Procedure - Attempted removal of IUD with new Mirena IUD insertion.  Consent to procedure.  Speculum placed in vagina.  Sterile prep with betadine.  Paracervical block with 10 cc 1% lidocaine.  Tenaculum to anterior cervical lip.  IUD strings visible with probing of the cervix.  Rings forceps used and attempt to remove IUD several times by pulling.  IUD will not move.  Procedure abandoned.   Chaperone was present for exam.  ASSESSMENT  Attempted Mirena IUD removal, unsuccessful.  Hx Cesarean Section.  Low vit D.  PLAN  Will have patient return for an ultrasound guided Mirena IUD removal and reinsertion of the new Mirena IUD.  Will repeat Cyctec dosing and plan for a paracervical block again.  Check vit D today. Will do a fasting cholesterol at next visit.    An After Visit Summary was printed and given to the patient.

## 2016-05-14 LAB — VITAMIN D 25 HYDROXY (VIT D DEFICIENCY, FRACTURES): Vit D, 25-Hydroxy: 30 ng/mL (ref 30–100)

## 2016-05-15 ENCOUNTER — Other Ambulatory Visit: Payer: Self-pay

## 2016-05-15 DIAGNOSIS — Z30431 Encounter for routine checking of intrauterine contraceptive device: Secondary | ICD-10-CM

## 2016-05-15 DIAGNOSIS — Z30433 Encounter for removal and reinsertion of intrauterine contraceptive device: Secondary | ICD-10-CM

## 2016-05-16 ENCOUNTER — Other Ambulatory Visit (INDEPENDENT_AMBULATORY_CARE_PROVIDER_SITE_OTHER): Payer: BLUE CROSS/BLUE SHIELD

## 2016-05-16 ENCOUNTER — Other Ambulatory Visit: Payer: BLUE CROSS/BLUE SHIELD

## 2016-05-16 ENCOUNTER — Encounter: Payer: Self-pay | Admitting: Obstetrics and Gynecology

## 2016-05-16 ENCOUNTER — Ambulatory Visit (INDEPENDENT_AMBULATORY_CARE_PROVIDER_SITE_OTHER): Payer: BLUE CROSS/BLUE SHIELD

## 2016-05-16 ENCOUNTER — Ambulatory Visit (INDEPENDENT_AMBULATORY_CARE_PROVIDER_SITE_OTHER): Payer: BLUE CROSS/BLUE SHIELD | Admitting: Obstetrics and Gynecology

## 2016-05-16 VITALS — BP 128/80 | HR 80 | Ht 70.5 in | Wt 205.0 lb

## 2016-05-16 DIAGNOSIS — Z30433 Encounter for removal and reinsertion of intrauterine contraceptive device: Secondary | ICD-10-CM

## 2016-05-16 DIAGNOSIS — Z30431 Encounter for routine checking of intrauterine contraceptive device: Secondary | ICD-10-CM

## 2016-05-16 DIAGNOSIS — R899 Unspecified abnormal finding in specimens from other organs, systems and tissues: Secondary | ICD-10-CM

## 2016-05-16 DIAGNOSIS — Z0000 Encounter for general adult medical examination without abnormal findings: Secondary | ICD-10-CM

## 2016-05-16 LAB — LIPID PANEL
Cholesterol: 218 mg/dL — ABNORMAL HIGH (ref <200)
HDL: 54 mg/dL (ref >50)
LDL Cholesterol: 142 mg/dL — ABNORMAL HIGH (ref <100)
Total CHOL/HDL Ratio: 4 Ratio (ref <5.0)
Triglycerides: 109 mg/dL (ref <150)
VLDL: 22 mg/dL (ref <30)

## 2016-05-16 NOTE — Patient Instructions (Signed)

## 2016-05-16 NOTE — Progress Notes (Signed)
Patient ID: Sandra [name], female   DOB: 1965-09-23, 51 y.o.   MRN: [id number] GYNECOLOGY  VISIT   HPI: 51 y.o.   Married  Caucasian  female   G2P2002 with No LMP recorded. Patient is not currently having periods (Reason: IUD).   here for pelvic ultrasound guided Mirena IUD removal and reinsertion.    Had attempted IUD removal and reinsertion on 05/13/16.  Procedure was abandoned as the IUD could not be easily removed and there was concern for a malpositioned IUD.   GYNECOLOGIC HISTORY: No LMP recorded. Patient is not currently having periods (Reason: IUD). Contraception:  Mirena IUD inserted 05/2011 Menopausal hormone therapy:  n/a Last mammogram:   10-19-15 Density C/Rt.Br.poss.asymmetry warrants further evaluation, Lt.Br.Neg. 10-25-16 Rt.Br.Diag.and U/S reveal Rt.br.clusters of cyst--neg/BiRads2:TBC Last pap smear:  11-18-14 Neg:Neg HR HPV, 09/2011 Neg:Neg HR HPV          OB History    Gravida Para Term Preterm AB Living   2 2 2     2    SAB TAB Ectopic Multiple Live Births                     Patient Active Problem List   Diagnosis Date Noted  . Sleep related headaches 01/08/2016  . Bruxism 01/08/2016  . Snoring 01/08/2016  . Sleep apnea 01/08/2016  . Menstrual headache 10/28/2013  . LLQ pain 10/28/2013  . Other and unspecified hyperlipidemia 02/11/2013    Past Medical History:  Diagnosis Date  . Endometriosis   . Hyperlipidemia 2107  . Lichen sclerosus    vulva  . Low serum vitamin D 2017    Past Surgical History:  Procedure Laterality Date  . APPENDECTOMY  2005  . CESAREAN SECTION  2002  . MANDIBLE RECONSTRUCTION  1985  . PELVIC LAPAROSCOPY  2004   endometriosis  . shinn surgery  1988    Current Outpatient Prescriptions  Medication Sig Dispense Refill  . betamethasone valerate ointment (VALISONE) 0.1 % Apply q day to bid for 1-2 weeks as needed (Patient not taking: Reported on 05/13/2016) 45 g 1  . clobetasol cream (TEMOVATE) [id number] % APPLY 1 APPLICATION  TOPICALLY ONCE A WEEK.--INS LIMIT QTY  1  . lamoTRIgine (LAMICTAL) 25 MG tablet Take 1 tablet by mouth daily.  0  . levonorgestrel (MIRENA) 20 MCG/24HR IUD 1 each by Intrauterine route once.    . misoprostol (CYTOTEC) 200 MCG tablet Place one tablet vaginally night before procedure and place one tablet vaginally the morning of procedure. 2 tablet 0  . Omega-3 Fatty Acids (FISH OIL) 1000 MG CAPS Take 1 capsule by mouth daily.    Marland Kitchen venlafaxine XR (EFFEXOR-XR) 150 MG 24 hr capsule Take 150 mg by mouth daily.  0  . Vitamin D, Ergocalciferol, (DRISDOL) 50000 units CAPS capsule Take 1 capsule (50,000 Units total) by mouth every 14 (fourteen) days. 6 capsule 0   No current facility-administered medications for this visit.      ALLERGIES: Patient has no known allergies.  Family History  Problem Relation Age of Onset  . Stroke Mother   . Heart disease Mother     deceased  . Colon cancer Father   . Hypertension Brother     Social History   Social History  . Marital status: Married    Spouse name: N/A  . Number of children: N/A  . Years of education: N/A   Occupational History  . Not on file.   Social History Main Topics  .  Smoking status: Never Smoker  . Smokeless tobacco: Never Used  . Alcohol use No  . Drug use: No  . Sexual activity: Yes    Partners: Male    Birth control/ protection: IUD     Comment: Mirena inserted 05/2011   Other Topics Concern  . Not on file   Social History Narrative  . No narrative on file    ROS:  Pertinent items are noted in HPI.  PHYSICAL EXAMINATION:    BP 128/80 (BP Location: Left Arm, Patient Position: Sitting, Cuff Size: Normal)   Pulse 80   Ht 5' 10.5" (1.791 m)   Wt 205 lb (93 kg)   BMI 29.00 kg/m     General appearance: alert, cooperative and appears stated age   IUD removal and reinsertion.  Ultrasound images reviewed in real time. Consent for IUD removal and replacement of new Mirena IUD. Lot [id number], expiration  04/20. Speculum placed in vagina.  Sterile prep with Hibiclens.  Paracervical block with 10 cc 1% lidocaine, lot [id number], expiration 02/21. Tenaculum to anterior cervical lip.  Dressing forceps used to remove IUD, intact, shown to patient, and discarded.  Cervix reprepped with Hibiclens.  Uterus sounded to 8.5 cm.  Mirena IUD placed without difficulty under transabdominal guidance. Strings trimmed. Speculum removed.  TV ultrasound performed to confirm proper placement of IUD in endometrial canal.   No complications to procedure.  Minimal EBL.   Chaperone was present for exam.  ASSESSMENT   Successful ultrasound guided removal and replacement of Mirena IUD.   PLAN  Follow up in 4 weeks.  Instructions and precautions given.  Fasting cholesterol panel drawn today.    An After Visit Summary was printed and given to the patient.

## 2016-05-16 NOTE — Progress Notes (Signed)
Encounter reviewed by Dr. Jenna Routzahn Amundson C. Silva.  

## 2016-06-14 ENCOUNTER — Encounter: Payer: Self-pay | Admitting: Obstetrics and Gynecology

## 2016-06-14 ENCOUNTER — Ambulatory Visit (INDEPENDENT_AMBULATORY_CARE_PROVIDER_SITE_OTHER): Payer: BLUE CROSS/BLUE SHIELD | Admitting: Obstetrics and Gynecology

## 2016-06-14 VITALS — BP 124/64 | HR 88 | Ht 70.5 in | Wt 205.6 lb

## 2016-06-14 DIAGNOSIS — Z30431 Encounter for routine checking of intrauterine contraceptive device: Secondary | ICD-10-CM | POA: Diagnosis not present

## 2016-06-14 DIAGNOSIS — R6882 Decreased libido: Secondary | ICD-10-CM | POA: Diagnosis not present

## 2016-06-14 MED ORDER — NONFORMULARY OR COMPOUNDED ITEM: 0 refills | Status: DC

## 2016-06-14 NOTE — Progress Notes (Signed)
GYNECOLOGY  VISIT   HPI: 51 y.o.   Married  Caucasian  female   G2P2002 with No LMP recorded. Patient is not currently having periods (Reason: IUD).   here for follow up after Mirena IUD insertion performed on 05/16/16.   Continuous bleeding but is very mild.  No pain or discomfort.   Able to feel strings.  No dyspareunia.  Decreased libido, always per patient.  This is not new.  GYNECOLOGIC HISTORY: No LMP recorded. Patient is not currently having periods (Reason: IUD). Contraception:  Mirena IUD inserted 05-16-16 Menopausal hormone therapy:  n/a Last mammogram: 10-19-15 Density C/Rt.Br.poss.asymmetry warrants further evaluation, Lt.Br.Neg. 10-25-16 Rt.Br.Diag.and U/S reveal Rt.br.clusters of cyst--neg/BiRads2:TBC  Last pap smear: 11-18-14 Neg:Neg HR HPV, 09/2011 Neg:Neg HR HPV          OB History    Gravida Para Term Preterm AB Living   2 2 2     2    SAB TAB Ectopic Multiple Live Births                     Patient Active Problem List   Diagnosis Date Noted  . Sleep related headaches 01/08/2016  . Bruxism 01/08/2016  . Snoring 01/08/2016  . Sleep apnea 01/08/2016  . Menstrual headache 10/28/2013  . LLQ pain 10/28/2013  . Other and unspecified hyperlipidemia 02/11/2013    Past Medical History:  Diagnosis Date  . Endometriosis   . Hyperlipidemia 2017  . Lichen sclerosus    vulva  . Low serum vitamin D 2017    Past Surgical History:  Procedure Laterality Date  . APPENDECTOMY  2005  . CESAREAN SECTION  2002  . MANDIBLE RECONSTRUCTION  1985  . PELVIC LAPAROSCOPY  2004   endometriosis  . shinn surgery  1988    Current Outpatient Prescriptions  Medication Sig Dispense Refill  . betamethasone valerate ointment (VALISONE) 0.1 % Apply q day to bid for 1-2 weeks as needed 45 g 1  . clobetasol cream (TEMOVATE) 6.16 % APPLY 1 APPLICATION TOPICALLY ONCE A WEEK.--INS LIMIT QTY  1  . lamoTRIgine (LAMICTAL) 100 MG tablet Take 1 tablet by mouth daily.  2  . levonorgestrel  (MIRENA) 20 MCG/24HR IUD 1 each by Intrauterine route once.    . Omega-3 Fatty Acids (FISH OIL) 1000 MG CAPS Take 1 capsule by mouth daily.    Marland Kitchen venlafaxine XR (EFFEXOR-XR) 150 MG 24 hr capsule Take 150 mg by mouth daily.  0  . Vitamin D, Ergocalciferol, (DRISDOL) 50000 units CAPS capsule Take 1 capsule (50,000 Units total) by mouth every 14 (fourteen) days. 6 capsule 0   No current facility-administered medications for this visit.      ALLERGIES: Patient has no known allergies.  Family History  Problem Relation Age of Onset  . Stroke Mother   . Heart disease Mother     deceased  . Colon cancer Father   . Hypertension Brother     Social History   Social History  . Marital status: Married    Spouse name: N/A  . Number of children: N/A  . Years of education: N/A   Occupational History  . Not on file.   Social History Main Topics  . Smoking status: Never Smoker  . Smokeless tobacco: Never Used  . Alcohol use No  . Drug use: No  . Sexual activity: Yes    Partners: Male    Birth control/ protection: IUD     Comment: Mirena inserted 05/16/16   Other  Topics Concern  . Not on file   Social History Narrative  . No narrative on file    ROS:  Pertinent items are noted in HPI.  PHYSICAL EXAMINATION:    BP 124/64 (BP Location: Right Arm, Patient Position: Sitting, Cuff Size: Normal)   Pulse 88   Ht 5' 10.5" (1.791 m)   Wt 205 lb 9.6 oz (93.3 kg)   BMI 29.08 kg/m     General appearance: alert, cooperative and appears stated age   Pelvic: External genitalia:  no lesions              Urethra:  normal appearing urethra with no masses, tenderness or lesions              Bartholins and Skenes: normal                 Vagina: normal appearing vagina with normal color and discharge, no lesions              Cervix: no lesions.  IUD strings noted, about 2.5 cm length.                 Bimanual Exam:  Uterus:  normal size, contour, position, consistency, mobility, non-tender               Adnexa: no mass, fullness, tenderness              Chaperone was present for exam.  ASSESSMENT  Mirena IUD patient.  Bleeding profile is normal. Decreased libido.   PLAN  Discussed with patient medications that she is on can cause decreased libido. She would like to try testosterone supplementation.  Discussed side effects of acne, increased cholesterol, voice deepening.  Rx for testosterone propionate in petrolatum, twice daily for 6 weeks, and then once daily, given in hand and she will fill at [facility].  Will check testosterone level today and have patient come in for a recheck in 6 weeks and lab work.   An After Visit Summary was printed and given to the patient.  __15____ minutes face to face time of which over 50% was spent in counseling.

## 2016-06-28 DIAGNOSIS — F3340 Major depressive disorder, recurrent, in remission, unspecified: Secondary | ICD-10-CM | POA: Diagnosis not present

## 2016-07-25 ENCOUNTER — Other Ambulatory Visit: Payer: Self-pay | Admitting: Obstetrics and Gynecology

## 2016-07-25 DIAGNOSIS — R7989 Other specified abnormal findings of blood chemistry: Secondary | ICD-10-CM

## 2016-07-25 NOTE — Telephone Encounter (Signed)
Left message for patient to call back and schedule Vitamin D lab appointment and that prescription would not be refilled until after labs are resulted.

## 2016-07-25 NOTE — Telephone Encounter (Signed)
Medication refill request: Vitamin D Last AEX:  12/09/15 BS Next AEX: 12/26/16 BS Last MMG (if hormonal medication request): 10/19/15 BIRADS0, Density C, TBC; 10/26/15 Dx & U/S R Breast, BIRADS2: Benign Refill authorized: 12/09/15 #[id number] 0R. Please advise. Thank you.

## 2016-07-25 NOTE — Telephone Encounter (Signed)
Please contact patient to schedule a lab visit for a vit D recheck.  No plan to refill the Vit D until after we check her level.

## 2016-07-31 NOTE — Telephone Encounter (Signed)
Left message on vm (per DPR) for patient to call the office and schedule lab appointment to have Vitamin D rechecked. Stated that this needs to be done before a refill prescription can be sent out, per Dr. Elza Rafter request.

## 2016-08-06 NOTE — Telephone Encounter (Signed)
Left message on vm (per DPR) for patient to call the office and schedule lab appointment to have Vitamin D rechecked. Stated that this needs to be done before a refill prescription can be sent out, per Dr. Elza Rafter request.

## 2016-08-06 NOTE — Telephone Encounter (Signed)
Patient called back scheduling a lab appointment on 08/12/16 to recheck her Vit D.

## 2016-08-12 ENCOUNTER — Other Ambulatory Visit: Payer: Self-pay

## 2016-08-12 ENCOUNTER — Other Ambulatory Visit (INDEPENDENT_AMBULATORY_CARE_PROVIDER_SITE_OTHER): Payer: BLUE CROSS/BLUE SHIELD

## 2016-08-12 DIAGNOSIS — E559 Vitamin D deficiency, unspecified: Secondary | ICD-10-CM | POA: Diagnosis not present

## 2016-08-13 LAB — VITAMIN D 25 HYDROXY (VIT D DEFICIENCY, FRACTURES): Vit D, 25-Hydroxy: 36 ng/mL (ref 30–100)

## 2016-08-13 NOTE — Telephone Encounter (Signed)
Patient kept lab appointment yesterday. Please advise if prescription can be sent. Thank you.   Routing to SM since BS is out of the office.

## 2016-08-14 NOTE — Telephone Encounter (Signed)
Routing to Dr. Quincy Simmonds as Vit D was performed yesterday and results are back.

## 2016-09-16 ENCOUNTER — Other Ambulatory Visit: Payer: Self-pay | Admitting: Obstetrics and Gynecology

## 2016-09-16 DIAGNOSIS — Z1231 Encounter for screening mammogram for malignant neoplasm of breast: Secondary | ICD-10-CM

## 2016-09-20 DIAGNOSIS — F3340 Major depressive disorder, recurrent, in remission, unspecified: Secondary | ICD-10-CM | POA: Diagnosis not present

## 2016-10-28 ENCOUNTER — Ambulatory Visit
Admission: RE | Admit: 2016-10-28 | Discharge: 2016-10-28 | Disposition: A | Discharge status: Home / Self Care | Payer: BLUE CROSS/BLUE SHIELD | Source: Ambulatory Visit | Attending: Obstetrics and Gynecology | Admitting: Obstetrics and Gynecology

## 2016-10-28 DIAGNOSIS — Z1231 Encounter for screening mammogram for malignant neoplasm of breast: Secondary | ICD-10-CM

## 2016-12-25 NOTE — Progress Notes (Signed)
51 y.o. G39P2002 Married Caucasian female here for annual exam.    No bleeding with Mirena.  Some hot flashes.  Manageable.   Received testosterone Rx at last visit, and it was too expensive.   Uses [place] for vaginal dryness, and this helps.  Using Effexor to treat depression.  Has decreased orgasm and desire.  Did not do well with Wellbutrin.  Has follow up with her psychiatrist.   Desires labs today.  Is fasting.   ROS:  Weight gain, loss of sexual interest, pain with intercourse, depression.  Otherwise, negative ROS.  PCP:  Kathyrn Lass, MD Psychiatry:  [place]   No LMP recorded. Patient is not currently having periods (Reason: IUD).           Sexually active: Yes.   female The current method of family planning is IUD--Mirena inserted 05-13-16.    Exercising: No.   Smoker:  no  Health Maintenance: Pap: 11-18-14 Neg:Neg HR HPV, 10-24-11 Neg:Neg HR HPV History of abnormal Pap:  no MMG: 10-28-16 Density C/neg/BiRads1:TBC Colonoscopy:  2011 possible polyps with Dr. Hildred Priest due 2016/2017. BMD:  n/a  Result  n/a TDaP: 12-07-15 Gardasil:   no HIV: in pregnancy - negative.  Hep C:unsure Screening Labs:   Urine today: not done   reports that she has never smoked. She has never used smokeless tobacco. She reports that she does not drink alcohol or use drugs.  Past Medical History:  Diagnosis Date  . Endometriosis   . Hyperlipidemia 2017  . Lichen sclerosus    vulva  . Low serum vitamin D 2017    Past Surgical History:  Procedure Laterality Date  . APPENDECTOMY  2005  . CESAREAN SECTION  2002  . MANDIBLE RECONSTRUCTION  1985  . PELVIC LAPAROSCOPY  2004   endometriosis  . shinn surgery  1988    Current Outpatient Prescriptions  Medication Sig Dispense Refill  . betamethasone valerate ointment (VALISONE) 0.1 % Apply q day to bid for 1-2 weeks as needed 45 g 1  . Cholecalciferol (VITAMIN D3) 2000 units TABS Take 1 tablet by mouth 2 (two) times daily.     Marland Kitchen lamoTRIgine (LAMICTAL) 100 MG tablet Take 1 tablet by mouth daily.  2  . levonorgestrel (MIRENA) 20 MCG/24HR IUD 1 each by Intrauterine route once.    . NONFORMULARY OR COMPOUNDED ITEM Testosterone propionate 2% in white petrolatum, apply bid for 6 weeks and then daily as directed.  60 grams. 60 each 0  . Omega-3 Fatty Acids (FISH OIL) 1000 MG CAPS Take 1 capsule by mouth daily.    . Red Yeast Rice Extract (RED YEAST RICE PO) Take 3 tablets by mouth daily.    Marland Kitchen venlafaxine XR (EFFEXOR-XR) 150 MG 24 hr capsule Take 150 mg by mouth daily.  0   No current facility-administered medications for this visit.     Family History  Problem Relation Age of Onset  . Stroke Mother   . Heart disease Mother        deceased  . Colon cancer Father   . Hypertension Brother     ROS:  Pertinent items are noted in HPI.  Otherwise, a comprehensive ROS was negative.  Exam:   BP 108/64 (BP Location: Right Arm, Patient Position: Sitting, Cuff Size: Large)   Pulse 76   Resp 16   Ht 5' 10.5" (1.791 m)   Wt 211 lb (95.7 kg)   BMI 29.85 kg/m     General appearance: alert, cooperative and  appears stated age Head: Normocephalic, without obvious abnormality, atraumatic Neck: no adenopathy, supple, symmetrical, trachea midline and thyroid normal to inspection and palpation Lungs: clear to auscultation bilaterally Breasts: normal appearance, no masses or tenderness, No nipple retraction or dimpling, No nipple discharge or bleeding, No axillary or supraclavicular adenopathy Heart: regular rate and rhythm Abdomen: soft, non-tender; no masses, no organomegaly Extremities: extremities normal, atraumatic, no cyanosis or edema Skin: Skin color, texture, turgor normal. No rashes or lesions Lymph nodes: Cervical, supraclavicular, and axillary nodes normal. No abnormal inguinal nodes palpated Neurologic: Grossly normal  Pelvic: External genitalia:  no lesions              Urethra:  normal appearing urethra with  no masses, tenderness or lesions              Bartholins and Skenes: normal                 Vagina: normal appearing vagina with normal color and discharge, no lesions              Cervix: no lesions.  IUD strings noted.                Pap taken: No. Bimanual Exam:  Uterus:  normal size, contour, position, consistency, mobility, non-tender              Adnexa: no mass, fullness, tenderness              Rectal exam: Yes.  .  Confirms.              Anus:  normal sphincter tone, no lesions  Chaperone was present for exam.  Assessment:   Well woman visit with normal exam. Mirena IUD.  Lichen sclerosus.  FH colon cancer.  Low vit D. Hx elevated LDL cholesterol. Depression treated with Effexor.  Decreased libido and orgasm response.  Atrophy symptoms.  Plan: Mammogram screening discussed. Recommended self breast awareness. Pap and HR HPV as above. Guidelines for Calcium, Vitamin D, regular exercise program including cardiovascular and weight bearing exercise.  Exercise encouraged! Will get report from colonoscopy from this year.  Routine fasting labs and vit D level. She will discuss Side effects of Effexor with her psychiatrist.  We are not going to do testosterone treatment at this time.  We discussed water and oil based lubricants and vaginal estrogen.  She will do cooking oil for now. Follow up annually and prn.   After visit summary provided.

## 2016-12-26 ENCOUNTER — Ambulatory Visit (INDEPENDENT_AMBULATORY_CARE_PROVIDER_SITE_OTHER): Payer: BLUE CROSS/BLUE SHIELD | Admitting: Obstetrics and Gynecology

## 2016-12-26 ENCOUNTER — Encounter: Payer: Self-pay | Admitting: Obstetrics and Gynecology

## 2016-12-26 VITALS — BP 108/64 | HR 76 | Resp 16 | Ht 70.5 in | Wt 211.0 lb

## 2016-12-26 DIAGNOSIS — Z01419 Encounter for gynecological examination (general) (routine) without abnormal findings: Secondary | ICD-10-CM

## 2016-12-26 NOTE — Patient Instructions (Signed)

## 2016-12-27 LAB — COMPREHENSIVE METABOLIC PANEL
ALT: 12 IU/L (ref 0–32)
AST: 15 IU/L (ref 0–40)
Albumin/Globulin Ratio: 1.6 (ref 1.2–2.2)
Albumin: 4.4 g/dL (ref 3.5–5.5)
Alkaline Phosphatase: 58 IU/L (ref 39–117)
BUN/Creatinine Ratio: 10 (ref 9–23)
BUN: 9 mg/dL (ref 6–24)
Bilirubin Total: 0.5 mg/dL (ref 0.0–1.2)
CO2: 24 mmol/L (ref 20–29)
Calcium: 9.1 mg/dL (ref 8.7–10.2)
Chloride: 103 mmol/L (ref 96–106)
Creatinine, Ser: 0.94 mg/dL (ref 0.57–1.00)
GFR calc Af Amer: 82 mL/min/{1.73_m2} (ref >59)
GFR calc non Af Amer: 71 mL/min/{1.73_m2} (ref >59)
Globulin, Total: 2.7 g/dL (ref 1.5–4.5)
Glucose: 88 mg/dL (ref 65–99)
Potassium: 4.5 mmol/L (ref 3.5–5.2)
Sodium: 142 mmol/L (ref 134–144)
Total Protein: 7.1 g/dL (ref 6.0–8.5)

## 2016-12-27 LAB — LIPID PANEL
Chol/HDL Ratio: 4.5 ratio — ABNORMAL HIGH (ref 0.0–4.4)
Cholesterol, Total: 227 mg/dL — ABNORMAL HIGH (ref 100–199)
HDL: 51 mg/dL (ref >39)
LDL Calculated: 158 mg/dL — ABNORMAL HIGH (ref 0–99)
Triglycerides: 90 mg/dL (ref 0–149)
VLDL Cholesterol Cal: 18 mg/dL (ref 5–40)

## 2016-12-27 LAB — CBC
Hematocrit: 39.2 % (ref 34.0–46.6)
Hemoglobin: 13.3 g/dL (ref 11.1–15.9)
MCH: 30.4 pg (ref 26.6–33.0)
MCHC: 33.9 g/dL (ref 31.5–35.7)
MCV: 90 fL (ref 79–97)
Platelets: 257 10*3/uL (ref 150–379)
RBC: 4.37 x10E6/uL (ref 3.77–5.28)
RDW: 13.9 % (ref 12.3–15.4)
WBC: 4 10*3/uL (ref 3.4–10.8)

## 2016-12-27 LAB — VITAMIN D 25 HYDROXY (VIT D DEFICIENCY, FRACTURES): Vit D, 25-Hydroxy: 47.4 ng/mL (ref 30.0–100.0)

## 2016-12-29 ENCOUNTER — Encounter: Payer: Self-pay | Admitting: Obstetrics and Gynecology

## 2017-01-13 DIAGNOSIS — F520 Hypoactive sexual desire disorder: Secondary | ICD-10-CM | POA: Insufficient documentation

## 2017-01-14 DIAGNOSIS — F4323 Adjustment disorder with mixed anxiety and depressed mood: Secondary | ICD-10-CM | POA: Diagnosis not present

## 2017-01-21 DIAGNOSIS — F4323 Adjustment disorder with mixed anxiety and depressed mood: Secondary | ICD-10-CM | POA: Diagnosis not present

## 2017-02-04 DIAGNOSIS — F4323 Adjustment disorder with mixed anxiety and depressed mood: Secondary | ICD-10-CM | POA: Diagnosis not present

## 2017-02-10 DIAGNOSIS — Z23 Encounter for immunization: Secondary | ICD-10-CM | POA: Diagnosis not present

## 2017-02-27 DIAGNOSIS — F4323 Adjustment disorder with mixed anxiety and depressed mood: Secondary | ICD-10-CM | POA: Diagnosis not present

## 2017-03-04 DIAGNOSIS — F4323 Adjustment disorder with mixed anxiety and depressed mood: Secondary | ICD-10-CM | POA: Diagnosis not present

## 2017-04-08 DIAGNOSIS — F4323 Adjustment disorder with mixed anxiety and depressed mood: Secondary | ICD-10-CM | POA: Diagnosis not present

## 2017-04-17 DIAGNOSIS — F3340 Major depressive disorder, recurrent, in remission, unspecified: Secondary | ICD-10-CM | POA: Diagnosis not present

## 2017-04-21 ENCOUNTER — Ambulatory Visit: Payer: BLUE CROSS/BLUE SHIELD | Admitting: Family Medicine

## 2017-04-21 ENCOUNTER — Encounter: Payer: Self-pay | Admitting: Family Medicine

## 2017-04-21 VITALS — BP 112/78 | HR 83 | Temp 98.4°F | Ht 70.5 in | Wt 216.8 lb

## 2017-04-21 DIAGNOSIS — L989 Disorder of the skin and subcutaneous tissue, unspecified: Secondary | ICD-10-CM | POA: Diagnosis not present

## 2017-04-21 DIAGNOSIS — E7800 Pure hypercholesterolemia, unspecified: Secondary | ICD-10-CM | POA: Insufficient documentation

## 2017-04-21 DIAGNOSIS — N904 Leukoplakia of vulva: Secondary | ICD-10-CM | POA: Diagnosis not present

## 2017-04-21 DIAGNOSIS — F339 Major depressive disorder, recurrent, unspecified: Secondary | ICD-10-CM | POA: Diagnosis not present

## 2017-04-21 DIAGNOSIS — R5383 Other fatigue: Secondary | ICD-10-CM

## 2017-04-21 DIAGNOSIS — E559 Vitamin D deficiency, unspecified: Secondary | ICD-10-CM

## 2017-04-21 DIAGNOSIS — R7301 Impaired fasting glucose: Secondary | ICD-10-CM

## 2017-04-21 DIAGNOSIS — D229 Melanocytic nevi, unspecified: Secondary | ICD-10-CM | POA: Insufficient documentation

## 2017-04-21 DIAGNOSIS — E669 Obesity, unspecified: Secondary | ICD-10-CM

## 2017-04-21 LAB — COMPREHENSIVE METABOLIC PANEL
ALT: 13 U/L (ref 0–35)
AST: 15 U/L (ref 0–37)
Albumin: 4.3 g/dL (ref 3.5–5.2)
Alkaline Phosphatase: 46 U/L (ref 39–117)
BUN: 13 mg/dL (ref 6–23)
CO2: 31 mEq/L (ref 19–32)
Calcium: 9.5 mg/dL (ref 8.4–10.5)
Chloride: 102 mEq/L (ref 96–112)
Creatinine, Ser: 0.88 mg/dL (ref 0.40–1.20)
GFR: 72.07 mL/min (ref >60.00)
Glucose, Bld: 88 mg/dL (ref 70–99)
Potassium: 4.5 mEq/L (ref 3.5–5.1)
Sodium: 137 mEq/L (ref 135–145)
Total Bilirubin: 0.4 mg/dL (ref 0.2–1.2)
Total Protein: 7.1 g/dL (ref 6.0–8.3)

## 2017-04-21 LAB — LIPID PANEL
Cholesterol: 242 mg/dL — ABNORMAL HIGH (ref 0–200)
HDL: 58.6 mg/dL (ref >39.00)
LDL Cholesterol: 163 mg/dL — ABNORMAL HIGH (ref 0–99)
NonHDL: 183.29
Total CHOL/HDL Ratio: 4
Triglycerides: 99 mg/dL (ref 0.0–149.0)
VLDL: 19.8 mg/dL (ref 0.0–40.0)

## 2017-04-21 LAB — HEMOGLOBIN A1C: Hgb A1c MFr Bld: 5.4 % (ref 4.6–6.5)

## 2017-04-21 LAB — TSH: TSH: 1.43 u[IU]/mL (ref 0.35–4.50)

## 2017-04-21 LAB — VITAMIN B12: Vitamin B-12: 391 pg/mL (ref 211–911)

## 2017-04-21 NOTE — Progress Notes (Signed)
Sandra [name] is a 52 y.o. female is here to [employer].   Patient Care Team: Briscoe Deutscher, DO as PCP - General (Family Medicine)   History of Present Illness:   Shaune Pascal CMA acting as scribe for Dr. Juleen China.  HPI: Patient comes in today to establish care. She has a mole that is changing and would like a referral to dermatology. She had her lab work done at her annual physical with GYN and it was evaluated. She would like to discuss medications today.   There are no preventive care reminders to display for this patient.   Depression screen PHQ 2/9 04/21/2017  Decreased Interest 0  Down, Depressed, Hopeless 0  PHQ - 2 Score 0   PMHx, SurgHx, SocialHx, Medications, and Allergies were reviewed in the Visit Navigator and updated as appropriate.   Past Medical History:  Diagnosis Date  . Endometriosis, diagnosis via laparoscopy   . Hyperlipidemia 2017  . Lichen sclerosus, vulva   . Low serum vitamin D 2017   Past Surgical History:  Procedure Laterality Date  . APPENDECTOMY  2005  . CESAREAN SECTION  2002  . MANDIBLE RECONSTRUCTION  1985  . PELVIC LAPAROSCOPY  2004  . SHIN SURGERY  1998   Family History  Problem Relation Age of Onset  . Stroke Mother   . Heart disease Mother   . Colon cancer Father   . Hypertension Brother    Social History   Tobacco Use  . Smoking status: Never Smoker  . Smokeless tobacco: Never Used  Substance Use Topics  . Alcohol use: No    Alcohol/week: 0.0 oz  . Drug use: No    Current Medications and Allergies:   .  betamethasone valerate ointment (VALISONE) 0.1 %, Apply q day to bid for 1-2 weeks as needed, Disp: 45 g, Rfl: 1 .  Cholecalciferol (VITAMIN D3) 2000 units TABS, Take 1 tablet by mouth 2 (two) times daily., Disp: , Rfl:  .  lamoTRIgine (LAMICTAL) 100 MG tablet, Take 1 tablet by mouth daily., Disp: , Rfl: 2 .  levonorgestrel (MIRENA) 20 MCG/24HR IUD, 1 each by Intrauterine route once., Disp: , Rfl:  .  Omega-3  Fatty Acids (FISH OIL) 1000 MG CAPS, Take 1 capsule by mouth daily., Disp: , Rfl:  .  Red Yeast Rice Extract (RED YEAST RICE PO), Take 3 tablets by mouth daily., Disp: , Rfl:  .  venlafaxine XR (EFFEXOR-XR) 150 MG 24 hr capsule, Take 150 mg by mouth daily., Disp: , Rfl: 0  No Known Allergies   Review of Systems:   Pertinent items are noted in the HPI. Otherwise, ROS is negative.  Vitals:   Vitals:   04/21/17 0818  BP: 112/78  Pulse: 83  Temp: 98.4 F (36.9 C)  TempSrc: Oral  SpO2: 99%  Weight: 216 lb 12.8 oz (98.3 kg)  Height: 5' 10.5" (1.791 m)     Body mass index is 30.67 kg/m.  Physical Exam:   Physical Exam  Constitutional: She is oriented to person, place, and time. She appears well-developed and well-nourished. No distress.  HENT:  Head: Normocephalic and atraumatic.  Right Ear: External ear normal.  Left Ear: External ear normal.  Nose: Nose normal.  Mouth/Throat: Oropharynx is clear and moist.  Eyes: Conjunctivae and EOM are normal. Pupils are equal, round, and reactive to light.  Neck: Normal range of motion. Neck supple. No thyromegaly present.  Cardiovascular: Normal rate, regular rhythm, normal heart sounds and intact distal pulses.  Pulmonary/Chest: Effort normal and breath sounds normal.  Abdominal: Soft. Bowel sounds are normal.  Musculoskeletal: Normal range of motion.  Lymphadenopathy:    She has no cervical adenopathy.  Neurological: She is alert and oriented to person, place, and time.  Skin: Skin is warm and dry. Capillary refill takes less than 2 seconds.  Benign-appearing hemangioma on left shoulder.  Psychiatric: She has a normal mood and affect. Her behavior is normal.  Nursing note and vitals reviewed.  Assessment and Plan:   1. Pure hypercholesterolemia Previous lipid panel with hyperlipidemia.   Lab Results  Component Value Date   CHOL 227 (H) 12/26/2016   HDL 51 12/26/2016   LDLCALC 158 (H) 12/26/2016   TRIG 90 12/26/2016    CHOLHDL 4.5 (H) 12/26/2016   She has been taking red yeast rice.  She is tolerating the supplement at this time.  No weight changes.  Patient admits that carbohydrates are her downfall.  We will recheck today.  Patient is okay with starting a true statin if this is still elevated.  - Comprehensive metabolic panel - Lipid panel  2. Obesity (BMI 30-39.9) The patient is asked to make an attempt to improve diet and exercise patterns to aid in medical management of this problem.   - TSH  3. Fasting hyperglycemia - Hemoglobin A1c  4. Fatigue - TSH - Hemoglobin A1c - B12  5. Skin lesion - Ambulatory referral to Dermatology  6. Depression, recurrent ([place]) Patient has a long history of depression.  She is followed with a mood disorder clinic.  She states that she does feel flat with her current regimen.  She and her psychiatric provider are working on weaning her Effexor down to 75 mg p.o. daily.  There has been no discussion on changing her Lamictal yet.  No suicidal or homicidal ideations.  7. Vitamin D deficiency Well controlled.  No signs of complications, medication side effects, or red flags.  Continue current regimen.     . Reviewed expectations re: course of current medical issues. . Discussed self-management of symptoms. . Outlined signs and symptoms indicating need for more acute intervention. . Patient verbalized understanding and all questions were answered. Marland Kitchen Health Maintenance issues including appropriate healthy diet, exercise, and smoking avoidance were discussed with patient. . See orders for this visit as documented in the electronic medical record. . Patient received an After Visit Summary.  CMA served as Education administrator during this visit. History, Physical, and Plan performed by medical provider. The above documentation has been reviewed and is accurate and complete. Briscoe Deutscher, D.O.   Briscoe Deutscher, DO [place], [place] [facility] 04/21/2017

## 2017-04-29 DIAGNOSIS — F4323 Adjustment disorder with mixed anxiety and depressed mood: Secondary | ICD-10-CM | POA: Diagnosis not present

## 2017-05-13 DIAGNOSIS — F4323 Adjustment disorder with mixed anxiety and depressed mood: Secondary | ICD-10-CM | POA: Diagnosis not present

## 2017-05-26 ENCOUNTER — Encounter: Payer: Self-pay | Admitting: Family Medicine

## 2017-05-27 DIAGNOSIS — F4323 Adjustment disorder with mixed anxiety and depressed mood: Secondary | ICD-10-CM | POA: Diagnosis not present

## 2017-06-24 DIAGNOSIS — F4323 Adjustment disorder with mixed anxiety and depressed mood: Secondary | ICD-10-CM | POA: Diagnosis not present

## 2017-07-10 DIAGNOSIS — D225 Melanocytic nevi of trunk: Secondary | ICD-10-CM | POA: Diagnosis not present

## 2017-07-10 DIAGNOSIS — L814 Other melanin hyperpigmentation: Secondary | ICD-10-CM | POA: Diagnosis not present

## 2017-07-10 DIAGNOSIS — L821 Other seborrheic keratosis: Secondary | ICD-10-CM | POA: Diagnosis not present

## 2017-07-10 DIAGNOSIS — D1801 Hemangioma of skin and subcutaneous tissue: Secondary | ICD-10-CM | POA: Diagnosis not present

## 2017-07-14 DIAGNOSIS — F3340 Major depressive disorder, recurrent, in remission, unspecified: Secondary | ICD-10-CM | POA: Diagnosis not present

## 2017-07-14 DIAGNOSIS — F4323 Adjustment disorder with mixed anxiety and depressed mood: Secondary | ICD-10-CM | POA: Diagnosis not present

## 2017-09-24 DIAGNOSIS — F3340 Major depressive disorder, recurrent, in remission, unspecified: Secondary | ICD-10-CM | POA: Diagnosis not present

## 2017-09-25 ENCOUNTER — Telehealth: Payer: Self-pay | Admitting: Obstetrics and Gynecology

## 2017-09-25 NOTE — Telephone Encounter (Signed)
Patient is asking for a refill of generic clobetasol to the pharmacy on file.

## 2017-09-26 ENCOUNTER — Other Ambulatory Visit: Payer: Self-pay | Admitting: Obstetrics and Gynecology

## 2017-09-26 MED ORDER — CLOBETASOL PROPIONATE 0.05 % EX OINT: 1.0000 "application " | TOPICAL_OINTMENT | Freq: Two times a day (BID) | CUTANEOUS | 1 refills | Status: DC

## 2017-09-26 NOTE — Telephone Encounter (Signed)
Medication refill request: generic clobetasol per patient Last AEX:  12-26-16 Next AEX: 01-14-18 Last MMG (if hormonal medication request): 7/18 neg Refill authorized: please send rx if appropriate

## 2017-09-26 NOTE — Telephone Encounter (Signed)
I sent in an Rx to her pharmacy.

## 2017-10-01 ENCOUNTER — Telehealth: Payer: Self-pay | Admitting: *Deleted

## 2017-10-01 ENCOUNTER — Encounter: Payer: Self-pay | Admitting: Obstetrics and Gynecology

## 2017-10-01 NOTE — Telephone Encounter (Signed)
My Chart message received:  ----- Message from Mychart, Generic sent at 10/01/2017 1:32 PM EDT -----    Is there an alternative to the Clobetasol? I know we shouldn't put a cost on a medicine to keep [place] from filing, but it really seems expensive in years past.     Thank you!  Stanton Kidney

## 2017-10-01 NOTE — Telephone Encounter (Signed)
Refill requested on 09-25-17. Treatment for Lichen Sclerosis. Call to patient. Left message to call back to triage nurse.

## 2017-10-03 MED ORDER — BETAMETHASONE VALERATE 0.1 % EX OINT: TOPICAL_OINTMENT | CUTANEOUS | 1 refills | Status: DC

## 2017-10-03 NOTE — Telephone Encounter (Signed)
Patient returning call to triage nurse

## 2017-10-03 NOTE — Telephone Encounter (Signed)
She could try betamethasone valerate ointment 0.1%. Apply bid x 2 weeks prn flare of lichen sclerosus.  Dispense:  15 gram, RF one.

## 2017-10-03 NOTE — Telephone Encounter (Signed)
Call to [facility], spoke to [place]um. Rx dispensed was generic clobetasol.  Previous RX dispensed in 2017 was betamethasone ointment and had no patient cost.   Routing to Dr Quincy Simmonds for review.

## 2017-10-03 NOTE — Telephone Encounter (Signed)
Call to patient. States recent refill of Clobetasol has increased in price to $80. ( previously was <$10)  States pharmacy was unable to explain.  Patient needs generic or alternative.

## 2017-10-03 NOTE — Telephone Encounter (Signed)
Call to patient. Advised of update from pharmacy and alternative option per Dr Quincy Simmonds. Patient would like to change to betamethasone.  Prescription changed. Encounter closed.

## 2017-10-23 DIAGNOSIS — D1801 Hemangioma of skin and subcutaneous tissue: Secondary | ICD-10-CM | POA: Insufficient documentation

## 2017-10-23 DIAGNOSIS — L814 Other melanin hyperpigmentation: Secondary | ICD-10-CM | POA: Insufficient documentation

## 2017-10-23 DIAGNOSIS — L821 Other seborrheic keratosis: Secondary | ICD-10-CM | POA: Insufficient documentation

## 2017-10-23 DIAGNOSIS — D225 Melanocytic nevi of trunk: Secondary | ICD-10-CM | POA: Insufficient documentation

## 2017-10-29 DIAGNOSIS — F331 Major depressive disorder, recurrent, moderate: Secondary | ICD-10-CM | POA: Diagnosis not present

## 2017-10-29 DIAGNOSIS — F3340 Major depressive disorder, recurrent, in remission, unspecified: Secondary | ICD-10-CM | POA: Diagnosis not present

## 2017-10-29 DIAGNOSIS — E7212 Methylenetetrahydrofolate reductase deficiency: Secondary | ICD-10-CM | POA: Diagnosis not present

## 2017-11-04 ENCOUNTER — Telehealth: Payer: Self-pay | Admitting: Obstetrics and Gynecology

## 2017-11-04 DIAGNOSIS — Q72812 Congenital shortening of left lower limb: Secondary | ICD-10-CM | POA: Diagnosis not present

## 2017-11-04 DIAGNOSIS — M9901 Segmental and somatic dysfunction of cervical region: Secondary | ICD-10-CM | POA: Diagnosis not present

## 2017-11-04 DIAGNOSIS — M5382 Other specified dorsopathies, cervical region: Secondary | ICD-10-CM | POA: Diagnosis not present

## 2017-11-04 DIAGNOSIS — M9905 Segmental and somatic dysfunction of pelvic region: Secondary | ICD-10-CM | POA: Diagnosis not present

## 2017-11-04 NOTE — Telephone Encounter (Signed)
Patient called back after scheduling an appointment with Dr. Quincy Simmonds for a breast lump on 11/06/17 with Dr. Quincy Simmonds. She'd like the appointment moved up to sooner, if possible. Patient has been added to Dr. Elza Rafter wait list as a high priority. Routing to triage for review.

## 2017-11-04 NOTE — Telephone Encounter (Signed)
Left message to call Kierstyn Baranowski at [phone].  

## 2017-11-05 NOTE — Progress Notes (Signed)
GYNECOLOGY  VISIT   HPI: 52 y.o.   Married  Caucasian  female   G2P2002 with No LMP recorded. (Menstrual status: IUD).   here for evaluation of lump on right breast, unknown when is started.  No pain.   Due for mammogram.   GYNECOLOGIC HISTORY: No LMP recorded. (Menstrual status: IUD). Contraception:  IUD Menopausal hormone therapy:  n/a Last mammogram:  10/28/2016 Last pap smear:   11/15/2014        OB History    Gravida  2   Para  2   Term  2   Preterm      AB      Living  2     SAB      TAB      Ectopic      Multiple      Live Births                 Patient Active Problem List   Diagnosis Date Noted  . Melanocytic nevus of trunk 10/23/2017  . Other seborrheic keratosis 10/23/2017  . Hemangioma of skin and subcutaneous tissue 10/23/2017  . Other melanin hyperpigmentation 10/23/2017  . Skin mole 04/21/2017  . Depression, recurrent ([place]) 04/21/2017  . Obesity (BMI 30-39.9) 04/21/2017  . Pure hypercholesterolemia 04/21/2017  . Vitamin D deficiency 04/21/2017  . Lichen sclerosus et atrophicus of the vulva 04/21/2017  . 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 Sleep related headaches 01/08/2016  . Bruxism 01/08/2016  . Menstrual headache 10/28/2013  . Other and unspecified hyperlipidemia 02/11/2013    Past Medical History:  Diagnosis Date  . Depression   . Endometriosis, diagnosis via laparoscopy   . Hyperlipidemia 2017  . Lichen sclerosus, vulva   . Low serum vitamin D 2017    Past Surgical History:  Procedure Laterality Date  . APPENDECTOMY  2005  . CESAREAN SECTION  2002  . MANDIBLE RECONSTRUCTION  1985  . PELVIC LAPAROSCOPY  2004  . [facility] SURGERY  1998    Current Outpatient Medications  Medication Sig Dispense Refill  . betamethasone valerate ointment (VALISONE) 0.1 % Apply bid for 1-2 weeks as needed for flare of lichen sclerosis. 15 g 1  . levonorgestrel (MIRENA) 20 MCG/24HR IUD 1 each by Intrauterine route once.    . Omega-3 Fatty Acids  (FISH OIL) 1000 MG CAPS Take 1 capsule by mouth daily.    . Red Yeast Rice Extract (RED YEAST RICE PO) Take 3 tablets by mouth daily.    Marland Kitchen venlafaxine XR (EFFEXOR-XR) 75 MG 24 hr capsule Take 75 mg by mouth daily.  1   No current facility-administered medications for this visit.      ALLERGIES: Patient has no known allergies.  Family History  Problem Relation Age of Onset  . Stroke Mother   . Heart disease Mother   . Colon cancer Father   . Hypertension Brother     Social History   Socioeconomic History  . Marital status: Married    Spouse name: Not on file  . Number of children: Not on file  . Years of education: Not on file  . Highest education level: Not on file  Occupational History  . Not on file  Social Needs  . Financial resource strain: Not on file  . Food insecurity:    Worry: Not on file    Inability: Not on file  . Transportation needs:    Medical: Not on file    Non-medical: Not on file  Tobacco Use  .  Smoking status: Never Smoker  . Smokeless tobacco: Never Used  Substance and Sexual Activity  . Alcohol use: No    Alcohol/week: 0.0 standard drinks  . Drug use: No  . Sexual activity: Yes    Partners: Male    Birth control/protection: IUD    Comment: Mirena inserted 05/16/16  Lifestyle  . Physical activity:    Days per week: Not on file    Minutes per session: Not on file  . Stress: Not on file  Relationships  . Social connections:    Talks on phone: Not on file    Gets together: Not on file    Attends religious service: Not on file    Active member of club or organization: Not on file    Attends meetings of clubs or organizations: Not on file    Relationship status: Not on file  . Intimate partner violence:    Fear of current or ex partner: Not on file    Emotionally abused: Not on file    Physically abused: Not on file    Forced sexual activity: Not on file  Other Topics Concern  . Not on file  Social History Narrative  . Not on file     Review of Systems  Constitutional: Negative.   HENT: Negative.   Eyes: Negative.   Respiratory: Negative.   Cardiovascular: Negative.   Gastrointestinal: Negative.   Endocrine: Negative.   Genitourinary:       Breast lump right side  Musculoskeletal: Negative.   Skin: Negative.   Allergic/Immunologic: Negative.   Neurological: Negative.   Hematological: Negative.   Psychiatric/Behavioral: Negative.   All other systems reviewed and are negative.   PHYSICAL EXAMINATION:    BP 122/78 (BP Location: Right Arm, Patient Position: Sitting, Cuff Size: Normal)   Pulse 80   Resp 16   Ht 5' 10.5" (1.791 m)   Wt 216 lb (98 kg)   BMI 30.55 kg/m     General appearance: alert, cooperative and appears stated age     Breasts: left - normal appearance, no masses or tenderness, No nipple retraction or dimpling, No nipple discharge or bleeding, No axillary or supraclavicular adenopathy. Right - 4 cm mass at 10:00, mobile, nontender.  No nipple discharge, bleeding, retraction or dimpling.  No axillary adenopathy.   Chaperone was present for exam.  ASSESSMENT  Right breast lump.   PLAN  Plan for dx bilateral mammogram and right breast US.   An After Visit Summary was printed and given to the patient.  __15____ minutes face to face time of which over 50% was spent in counseling.

## 2017-11-06 ENCOUNTER — Other Ambulatory Visit: Payer: Self-pay

## 2017-11-06 ENCOUNTER — Encounter

## 2017-11-06 ENCOUNTER — Ambulatory Visit: Payer: BLUE CROSS/BLUE SHIELD | Admitting: Obstetrics and Gynecology

## 2017-11-06 ENCOUNTER — Encounter: Payer: Self-pay | Admitting: Obstetrics and Gynecology

## 2017-11-06 VITALS — BP 122/78 | HR 80 | Resp 16 | Ht 70.5 in | Wt 216.0 lb

## 2017-11-06 DIAGNOSIS — N6310 Unspecified lump in the right breast, unspecified quadrant: Secondary | ICD-10-CM

## 2017-11-06 NOTE — Plan of Care (Signed)
Patient is scheduled for dx bilateral mammogram and R Breast ultrasound at [facility] 11/10/17 at 0950.

## 2017-11-06 NOTE — Telephone Encounter (Signed)
Patient seen in office on 11/06/17.   Encounter closed.

## 2017-11-10 ENCOUNTER — Other Ambulatory Visit: Payer: Self-pay | Admitting: Obstetrics and Gynecology

## 2017-11-10 ENCOUNTER — Ambulatory Visit
Admission: RE | Admit: 2017-11-10 | Discharge: 2017-11-10 | Disposition: A | Discharge status: Home / Self Care | Payer: BLUE CROSS/BLUE SHIELD | Source: Ambulatory Visit | Attending: Obstetrics and Gynecology | Admitting: Obstetrics and Gynecology

## 2017-11-10 DIAGNOSIS — N6310 Unspecified lump in the right breast, unspecified quadrant: Secondary | ICD-10-CM

## 2017-11-10 DIAGNOSIS — R922 Inconclusive mammogram: Secondary | ICD-10-CM | POA: Diagnosis not present

## 2017-11-11 ENCOUNTER — Ambulatory Visit
Admission: RE | Admit: 2017-11-11 | Discharge: 2017-11-11 | Disposition: A | Discharge status: Home / Self Care | Payer: BLUE CROSS/BLUE SHIELD | Source: Ambulatory Visit | Attending: Obstetrics and Gynecology | Admitting: Obstetrics and Gynecology

## 2017-11-11 DIAGNOSIS — N6310 Unspecified lump in the right breast, unspecified quadrant: Secondary | ICD-10-CM

## 2017-11-11 DIAGNOSIS — N6311 Unspecified lump in the right breast, upper outer quadrant: Secondary | ICD-10-CM | POA: Diagnosis not present

## 2017-11-11 DIAGNOSIS — Z170 Estrogen receptor positive status [ER+]: Secondary | ICD-10-CM | POA: Diagnosis not present

## 2017-11-11 DIAGNOSIS — C50411 Malignant neoplasm of upper-outer quadrant of right female breast: Secondary | ICD-10-CM | POA: Diagnosis not present

## 2017-11-11 HISTORY — PX: BREAST BIOPSY: SHX20

## 2017-11-12 ENCOUNTER — Telehealth: Payer: Self-pay | Admitting: Hematology and Oncology

## 2017-11-12 NOTE — Telephone Encounter (Signed)
Spoke with patient to confirm morning [facility] appointment for 8/21, packet will be emailed to patient

## 2017-11-14 ENCOUNTER — Encounter: Payer: Self-pay | Admitting: *Deleted

## 2017-11-14 ENCOUNTER — Telehealth: Payer: Self-pay | Admitting: Obstetrics and Gynecology

## 2017-11-14 DIAGNOSIS — Z170 Estrogen receptor positive status [ER+]: Secondary | ICD-10-CM

## 2017-11-14 DIAGNOSIS — Z30432 Encounter for removal of intrauterine contraceptive device: Secondary | ICD-10-CM

## 2017-11-14 DIAGNOSIS — C50411 Malignant neoplasm of upper-outer quadrant of right female breast: Secondary | ICD-10-CM | POA: Insufficient documentation

## 2017-11-14 NOTE — Telephone Encounter (Signed)
I left a message on cell phone that I called.  I left the office phone number for the patient to call back on Monday.   No details left.  I wanted to acknowledge the patient's recent breast biopsy showing cancer.

## 2017-11-18 NOTE — Telephone Encounter (Signed)
Patient returning call to Dr. Silva. °

## 2017-11-19 ENCOUNTER — Ambulatory Visit: Payer: Self-pay | Admitting: Surgery

## 2017-11-19 ENCOUNTER — Other Ambulatory Visit: Payer: Self-pay

## 2017-11-19 ENCOUNTER — Encounter: Payer: Self-pay | Admitting: Hematology and Oncology

## 2017-11-19 ENCOUNTER — Encounter: Payer: Self-pay | Admitting: *Deleted

## 2017-11-19 ENCOUNTER — Other Ambulatory Visit: Payer: Self-pay | Admitting: *Deleted

## 2017-11-19 ENCOUNTER — Inpatient Hospital Stay: Payer: BLUE CROSS/BLUE SHIELD | Attending: Hematology and Oncology

## 2017-11-19 ENCOUNTER — Inpatient Hospital Stay (HOSPITAL_BASED_OUTPATIENT_CLINIC_OR_DEPARTMENT_OTHER): Payer: BLUE CROSS/BLUE SHIELD | Admitting: Hematology and Oncology

## 2017-11-19 ENCOUNTER — Ambulatory Visit
Admission: RE | Admit: 2017-11-19 | Discharge: 2017-11-19 | Disposition: A | Discharge status: Home / Self Care | Payer: BLUE CROSS/BLUE SHIELD | Source: Ambulatory Visit | Attending: Radiation Oncology | Admitting: Radiation Oncology

## 2017-11-19 ENCOUNTER — Encounter: Payer: Self-pay | Admitting: Physical Therapy

## 2017-11-19 ENCOUNTER — Ambulatory Visit: Payer: BLUE CROSS/BLUE SHIELD | Attending: Surgery | Admitting: Physical Therapy

## 2017-11-19 VITALS — BP 149/82 | HR 78 | Temp 98.4°F | Resp 18 | Ht 70.5 in | Wt 217.5 lb

## 2017-11-19 DIAGNOSIS — C50411 Malignant neoplasm of upper-outer quadrant of right female breast: Secondary | ICD-10-CM | POA: Insufficient documentation

## 2017-11-19 DIAGNOSIS — Z803 Family history of malignant neoplasm of breast: Secondary | ICD-10-CM | POA: Diagnosis not present

## 2017-11-19 DIAGNOSIS — Z800 Family history of malignant neoplasm of digestive organs: Secondary | ICD-10-CM

## 2017-11-19 DIAGNOSIS — Z8042 Family history of malignant neoplasm of prostate: Secondary | ICD-10-CM | POA: Diagnosis not present

## 2017-11-19 DIAGNOSIS — Z170 Estrogen receptor positive status [ER+]: Secondary | ICD-10-CM | POA: Insufficient documentation

## 2017-11-19 DIAGNOSIS — C50911 Malignant neoplasm of unspecified site of right female breast: Secondary | ICD-10-CM | POA: Diagnosis not present

## 2017-11-19 DIAGNOSIS — R293 Abnormal posture: Secondary | ICD-10-CM | POA: Diagnosis not present

## 2017-11-19 LAB — CMP (CANCER CENTER ONLY)
ALT: 11 U/L (ref 0–44)
AST: 11 U/L — ABNORMAL LOW (ref 15–41)
Albumin: 4 g/dL (ref 3.5–5.0)
Alkaline Phosphatase: 60 U/L (ref 38–126)
Anion gap: 8 (ref 5–15)
BUN: 9 mg/dL (ref 6–20)
CO2: 27 mmol/L (ref 22–32)
Calcium: 9 mg/dL (ref 8.9–10.3)
Chloride: 104 mmol/L (ref 98–111)
Creatinine: 0.84 mg/dL (ref 0.44–1.00)
GFR, Est AFR Am: >60 mL/min (ref >60)
GFR, Estimated: >60 mL/min (ref >60)
Glucose, Bld: 89 mg/dL (ref 70–99)
Potassium: 3.9 mmol/L (ref 3.5–5.1)
Sodium: 139 mmol/L (ref 135–145)
Total Bilirubin: 0.4 mg/dL (ref 0.3–1.2)
Total Protein: 7.3 g/dL (ref 6.5–8.1)

## 2017-11-19 LAB — CBC WITH DIFFERENTIAL (CANCER CENTER ONLY)
Basophils Absolute: 0 10*3/uL (ref 0.0–0.1)
Basophils Relative: 1 %
Eosinophils Absolute: 0.2 10*3/uL (ref 0.0–0.5)
Eosinophils Relative: 4 %
HCT: 39.2 % (ref 34.8–46.6)
Hemoglobin: 13.1 g/dL (ref 11.6–15.9)
Lymphocytes Relative: 30 %
Lymphs Abs: 1.3 10*3/uL (ref 0.9–3.3)
MCH: 30 pg (ref 25.1–34.0)
MCHC: 33.5 g/dL (ref 31.5–36.0)
MCV: 89.8 fL (ref 79.5–101.0)
Monocytes Absolute: 0.4 10*3/uL (ref 0.1–0.9)
Monocytes Relative: 9 %
Neutro Abs: 2.4 10*3/uL (ref 1.5–6.5)
Neutrophils Relative %: 56 %
Platelet Count: 249 10*3/uL (ref 145–400)
RBC: 4.36 MIL/uL (ref 3.70–5.45)
RDW: 13.4 % (ref 11.2–14.5)
WBC Count: 4.3 10*3/uL (ref 3.9–10.3)

## 2017-11-19 MED ORDER — LIDOCAINE-PRILOCAINE 2.5-2.5 % EX CREA: 1.0000 "application " | TOPICAL_CREAM | CUTANEOUS | 6 refills | Status: DC | PRN

## 2017-11-19 MED ORDER — PROCHLORPERAZINE MALEATE 10 MG PO TABS: 10.0000 mg | ORAL_TABLET | Freq: Four times a day (QID) | ORAL | 1 refills | Status: DC | PRN

## 2017-11-19 MED ORDER — ONDANSETRON HCL 8 MG PO TABS: 8.0000 mg | ORAL_TABLET | Freq: Two times a day (BID) | ORAL | 1 refills | Status: DC | PRN

## 2017-11-19 MED ORDER — LORAZEPAM 0.5 MG PO TABS: 0.5000 mg | ORAL_TABLET | Freq: Every evening | ORAL | 0 refills | Status: DC | PRN

## 2017-11-19 NOTE — H&P (View-Only) (Signed)
Sandra [name] Documented: 11/19/2017 7:29 AM Location: [facility] Patient #: [id number] DOB: 10-12-1965 Undefined / Language: [name] / Race: White Female  History of Present Illness Sandra [name] Check MD; 11/19/2017 11:13 AM) Patient words: Pt sent at the request of Dr Sandra [name] for mass right breast notice for 1 month in the upper outer quadrant. Mammogram noted a 1.7 cm mass IDC grade 3 triple positive. No other complaints except the mass. Pt denies pain, nipple discharge or other abnormality.  The patient is a 52 year old female.   Past Surgical History Sandra Pummel, RN; 11/19/2017 7:30 AM) Appendectomy Breast Biopsy Right. Cesarean Section - 1  Diagnostic Studies History Sandra Pummel, RN; 11/19/2017 7:30 AM) Colonoscopy within last year Mammogram within last year Pap Smear 1-5 years ago  Medication History Sandra Pummel, RN; 11/19/2017 7:30 AM) Medications Reconciled  Social History Sandra Pummel, RN; 11/19/2017 7:30 AM) No alcohol use No caffeine use No drug use Tobacco use Never smoker.  Family History Sandra Pummel, RN; 11/19/2017 7:30 AM) Colon Cancer Father. Heart Disease Mother. Hypertension Brother. Prostate Cancer Father.  Pregnancy / Birth History Sandra Pummel, RN; 11/19/2017 7:30 AM) Age at menarche 76 years. Age of menopause 43-55 Gravida 2 Maternal age 76-35 Para 2  Other Problems Sandra Pummel, RN; 11/19/2017 7:30 AM) Hypercholesterolemia     Review of Systems Sandra Moores A. Nashid Pellum MD; 11/19/2017 11:12 AM) Skin Not Present- Change in Wart/Mole, Dryness, Hives, Jaundice, New Lesions, Non-Healing Wounds, Rash and Ulcer. Respiratory Not Present- Bloody sputum, Chronic Cough, Difficulty Breathing, Snoring and Wheezing. Breast Not Present- Breast Mass, Breast Pain, Nipple Discharge and Skin Changes. Cardiovascular Not Present- Chest Pain, Difficulty Breathing Lying Down, Leg Cramps, Palpitations, Rapid Heart  Rate, Shortness of Breath and Swelling of Extremities. Gastrointestinal Not Present- Abdominal Pain, Bloating, Bloody Stool, Change in Bowel Habits, Chronic diarrhea, Constipation, Difficulty Swallowing, Excessive gas, Gets full quickly at meals, Hemorrhoids, Indigestion, Nausea, Rectal Pain and Vomiting. Female Genitourinary Not Present- Frequency, Nocturia, Painful Urination, Pelvic Pain and Urgency. Musculoskeletal Not Present- Back Pain, Joint Pain, Joint Stiffness, Muscle Pain, Muscle Weakness and Swelling of Extremities. Neurological Not Present- Decreased Memory, Fainting, Headaches, Numbness, Seizures, Tingling, Tremor, Trouble walking and Weakness. Psychiatric Present- Depression. Not Present- Anxiety, Bipolar, Change in Sleep Pattern, Fearful and Frequent crying. Endocrine Present- Hot flashes. Not Present- Cold Intolerance, Excessive Hunger, Hair Changes, Heat Intolerance and New Diabetes. Hematology Not Present- Blood Thinners, Easy Bruising, Excessive bleeding, Gland problems, HIV and Persistent Infections. All other systems negative   Physical Exam (Sandra [name] A. Sandra Andrzejewski MD; 11/19/2017 11:13 AM)  General Mental Status-Alert. General Appearance-Consistent with stated age. Hydration-Well hydrated. Voice-Normal.  Head and Neck Head-normocephalic, atraumatic with no lesions or palpable masses. Trachea-midline. Thyroid Gland Characteristics - normal size and consistency.  Eye Eyeball - Bilateral-Extraocular movements intact. Sclera/Conjunctiva - Bilateral-No scleral icterus.  Chest and Lung Exam Chest and lung exam reveals -quiet, even and easy respiratory effort with no use of accessory muscles and on auscultation, normal breath sounds, no adventitious sounds and normal vocal resonance. Inspection Chest Wall - Normal. Back - normal.  Breast Note: 1 cm mass right breast UOQ mobile left breast normal.  Neurologic Neurologic evaluation reveals -alert and  oriented x 3 with no impairment of recent or remote memory. Mental Status-Normal.  Musculoskeletal Normal Exam - Left-Upper Extremity Strength Normal and Lower Extremity Strength Normal. Normal Exam - Right-Upper Extremity Strength Normal and Lower Extremity Strength Normal.  Lymphatic Head & Neck  General Head & Neck Lymphatics: Bilateral -  Description - Normal. Axillary  General Axillary Region: Bilateral - Description - Normal. Tenderness - Non Tender.    Assessment & Plan (Sandra Proudfoot A. Dan Scearce MD; 11/19/2017 11:16 AM)  BREAST CANCER, RIGHT (C50.911) Impression: pt has opted for neoadjuvant chemotherapy and needs port placement breast conservation to follow  MRI scheduled Pt requires port placement for chemotherapy. Risk include bleeding, infection, pneumothorax, hemothorax, mediastinal injury, nerve injury , blood vessel injury, strke, blood clots, death, migration. embolization and need for additional procedures. Pt agrees to proceed.  Current Plans You are being scheduled for surgery- Our schedulers will call you.  You should hear from our office's scheduling department within 5 working days about the location, date, and time of surgery. We try to make accommodations for patient's preferences in scheduling surgery, but sometimes the OR schedule or the surgeon's schedule prevents [place] from making those accommodations.  If you have not heard from our office [phone]) in 5 working days, call the office and ask for your surgeon's nurse.  If you have other questions about your diagnosis, plan, or surgery, call the office and ask for your surgeon's nurse.  Pt Education - [facility] Breast Cancer Information Given - [facility] "Breast Journey" Package We discussed the staging and pathophysiology of breast cancer. We discussed all of the different options for treatment for breast cancer including surgery, chemotherapy, radiation therapy, Herceptin, and antiestrogen therapy. We  discussed a sentinel lymph node biopsy as she does not appear to having lymph node involvement right now. We discussed the performance of that with injection of radioactive tracer and blue dye. We discussed that she would have an incision underneath her axillary hairline. We discussed that there is a bout a 10-20% chance of having a positive node with a sentinel lymph node biopsy and we will await the permanent pathology to make any other first further decisions in terms of her treatment. One of these options might be to return to the operating room to perform an axillary lymph node dissection. We discussed about a 1-2% risk lifetime of chronic shoulder pain as well as lymphedema associated with a sentinel lymph node biopsy. We discussed the options for treatment of the breast cancer which included lumpectomy versus a mastectomy. We discussed the performance of the lumpectomy with a wire placement. We discussed a 10-20% chance of a positive margin requiring reexcision in the operating room. We also discussed that she may need radiation therapy or antiestrogen therapy or both if she undergoes lumpectomy. We discussed the mastectomy and the postoperative care for that as well. We discussed that there is no difference in her survival whether she undergoes lumpectomy with radiation therapy or antiestrogen therapy versus a mastectomy. There is a slight difference in the local recurrence rate being 3-5% with lumpectomy and about 1% with a mastectomy. We discussed the risks of operation including bleeding, infection, possible reoperation. She understands her further therapy will be based on what her stages at the time of her operation.  Pt Education - [facility] Portacath HCI Use of a central venous catheter for intravenous therapy was discussed. Technique of catheter placement using ultrasound and fluoroscopy guidance was discussed. Risks such as bleeding, infection, pneumothorax, catheter occlusion, reoperation, and  other risks were discussed. I noted a good likelihood this will help address the problem. Questions were answered. The patient expressed understanding & wishes to proceed.

## 2017-11-19 NOTE — Therapy (Signed)
[place], [place], [address] Phone: [phone]   Fax:  [phone]  Physical Therapy Evaluation  Patient Details  Name: Sandra [name] MRN: [id number] Date of Birth: 04-28-1965 Referring Provider: Dr. Erroll Luna   Encounter Date: 11/19/2017  PT End of Session - 11/19/17 1123    Visit Number  1    Number of Visits  1    PT Start Time  1004    PT Stop Time  1026    PT Time Calculation (min)  22 min    Activity Tolerance  Patient tolerated treatment well    Behavior During Therapy  [facility] for tasks assessed/performed       Past Medical History:  Diagnosis Date  . Depression   . Endometriosis, diagnosis via laparoscopy   . Hyperlipidemia 2017  . Lichen sclerosus, vulva   . Low serum vitamin D 2017    Past Surgical History:  Procedure Laterality Date  . APPENDECTOMY  2005  . CESAREAN SECTION  2002  . MANDIBLE RECONSTRUCTION  1985  . PELVIC LAPAROSCOPY  2004  . [place]    There were no vitals filed for this visit.   Subjective Assessment - 11/19/17 1034    Subjective  Patient reports she is here today to be seen by her medical team for her newly diagnosed right breast cancer.    Patient is accompained by:  Family member    Pertinent History  Patient was diagnosed on 11/10/17 with right grade III invasive ductal carcinoma breast cancer. It is triple positive with a Ki67 of 70%. It measures 1.7 cm and is located in the upper outer quadrant.    Patient Stated Goals  Reduce lymphedema risk and learn post op shoulder ROM HEP    Currently in Pain?  No/denies         [facility] PT Assessment - 11/19/17 0001      Assessment   Medical Diagnosis  Right breast cancer    Referring Provider  Dr. [name] Cornett    Onset Date/Surgical Date  11/10/17    Hand Dominance  Right    Prior Therapy  none      Precautions   Precautions  Other (comment)    Precaution Comments  active cancer       Restrictions   Weight Bearing Restrictions  No      Balance Screen   Has the patient fallen in the past 6 months  No    Has the patient had a decrease in activity level because of a fear of falling?   No    Is the patient reluctant to leave their home because of a fear of falling?   No      Home Environment   Living Environment  Private residence    Living Arrangements  Spouse/significant other;Children   Husband, 31 and 102 y.o. 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   Leisure  She does not exercise      Cognition   Overall Cognitive Status  Within Functional Limits for tasks assessed      Posture/Postural Control   Posture/Postural Control  Postural limitations    Postural Limitations  Forward head;Rounded Shoulders      ROM / Strength   AROM / PROM / Strength  AROM;Strength  AROM   AROM Assessment Site  Shoulder;Cervical    Right/Left Shoulder  Right;Left    Right Shoulder Extension  67 Degrees    Right Shoulder Flexion  150 Degrees    Right Shoulder ABduction  172 Degrees    Right Shoulder Internal Rotation  65 Degrees    Right Shoulder External Rotation  79 Degrees    Left Shoulder Extension  66 Degrees    Left Shoulder Flexion  151 Degrees    Left Shoulder ABduction  170 Degrees    Left Shoulder Internal Rotation  62 Degrees    Left Shoulder External Rotation  86 Degrees    Cervical Flexion  WNL    Cervical Extension  WNL    Cervical - Right Side Bend  WNL    Cervical - Left Side Bend  WNL    Cervical - Right Rotation  WNL    Cervical - Left Rotation  WNL      Strength   Overall Strength  Within functional limits for tasks performed        LYMPHEDEMA/ONCOLOGY QUESTIONNAIRE - 11/19/17 1038      Type   Cancer Type  Right breast      Lymphedema Assessments   Lymphedema Assessments  Upper extremities      Right Upper Extremity Lymphedema    10 cm Proximal to Olecranon Process  32.4 cm    Olecranon Process  28.4 cm    10 cm Proximal to Ulnar Styloid Process  24 cm    Just Proximal to Ulnar Styloid Process  18 cm    Across Hand at [employer]  19.2 cm    At [place] of 2nd Digit  6.5 cm      Left Upper Extremity Lymphedema   10 cm Proximal to Olecranon Process  32.2 cm    Olecranon Process  28.2 cm    10 cm Proximal to Ulnar Styloid Process  23.4 cm    Just Proximal to Ulnar Styloid Process  16.9 cm    Across Hand at [employer]  18.3 cm    At [place] of 2nd Digit  6.4 cm             Objective measurements completed on examination: See above findings.       Patient was instructed today in a home exercise program today for post op shoulder range of motion. These included active assist shoulder flexion in sitting, scapular retraction, wall walking with shoulder abduction, and hands behind head external rotation.  She was encouraged to do these twice a day, holding 3 seconds and repeating 5 times when permitted by her physician.            PT Education - 11/19/17 1123    Education Details  Lymphedema risk reduction and post op shoulder ROM HEP    Person(s) Educated  Patient;Spouse    Methods  Explanation;Demonstration;Handout    Comprehension  Returned demonstration;Verbalized understanding           Breast Clinic Goals - 11/19/17 1126      Patient will be able to verbalize understanding of pertinent lymphedema risk reduction practices relevant to her diagnosis specifically related to skin care.   Time  1    Period  Days    Status  Achieved      Patient will be able to return demonstrate and/or verbalize understanding of the post-op home exercise program related to regaining shoulder range of motion.   Time  1    Period  Days    Status  Achieved      Patient will be able to verbalize understanding of the importance of attending the postoperative After Breast Cancer Class for further  lymphedema risk reduction education and therapeutic exercise.   Time  1    Period  Days    Status  Achieved            Plan - 11/19/17 1123    Clinical Impression Statement  Patient was diagnosed on 11/10/17 with right grade III invasive ductal carcinoma breast cancer. It is triple positive with a Ki67 of 70%. It measures 1.7 cm and is located in the upper outer quadrant. Her multidisciplinary medical team met prior to her assessments to determine a recommended treatment plan. She is planning to have neoadjuvant chemotherapy followed by a right lumpectomy and sentinel node biopsy, radiation, and anti-estrogen therapy. She will benefit from post op PT to regain shoulder ROM and strength.    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    Clinical Presentation  Stable    Clinical Decision Making  Low    Rehab Potential  Excellent    Clinical Impairments Affecting Rehab Potential  None    PT Frequency  One time visit    PT Treatment/Interventions  ADLs/Self Care Home Management;Therapeutic exercise;Patient/family education    PT Next Visit Plan  Will reassess if MD refers after surgery    PT Home Exercise Plan  Post op shoulder ROM HEP    Consulted and Agree with Plan of Care  Patient;Family member/caregiver    Family Member Consulted  Husband       Patient will benefit from skilled therapeutic intervention in order to improve the following deficits and impairments:  Postural dysfunction, Decreased range of motion, Pain, Impaired UE functional use, Decreased knowledge of precautions  Visit Diagnosis: Malignant neoplasm of upper-outer quadrant of right breast in female, estrogen receptor positive ([place]) - Plan: PT plan of care cert/re-cert  Abnormal posture - Plan: PT plan of care cert/re-cert   Patient will follow up at outpatient cancer rehab 3-4 weeks following surgery.  If the patient requires physical therapy at that time, a specific plan will be  dictated and sent to the referring physician for approval. The patient was educated today on appropriate basic range of motion exercises to begin post operatively and the importance of attending the After Breast Cancer class following surgery.  Patient was educated today on lymphedema risk reduction practices as it pertains to recommendations that will benefit the patient immediately following surgery.  She verbalized good understanding.     Problem List Patient Active Problem List   Diagnosis Date Noted  . Malignant neoplasm of upper-outer quadrant of right breast in female, estrogen receptor positive ([place]) 11/14/2017  . Melanocytic nevus of trunk 10/23/2017  . Other seborrheic keratosis 10/23/2017  . Hemangioma of skin and subcutaneous tissue 10/23/2017  . Other melanin hyperpigmentation 10/23/2017  . Skin mole 04/21/2017  . Depression, recurrent ([place]) 04/21/2017  . Obesity (BMI 30-39.9) 04/21/2017  . Pure hypercholesterolemia 04/21/2017  . Vitamin D deficiency 04/21/2017  . Lichen sclerosus et atrophicus of the vulva 04/21/2017  . 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 Sleep related headaches 01/08/2016  . Bruxism 01/08/2016  . Menstrual headache 10/28/2013  . Other and unspecified hyperlipidemia 02/11/2013   Annia Friendly, PT 11/19/17 11:28 AM   [place]  [place], [place], [address] Phone: [phone]   Fax:  [phone]  Name: Sandra [name] MRN: [id number] Date of Birth: 03/26/1966

## 2017-11-19 NOTE — Assessment & Plan Note (Signed)
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%, PR 10%, Ki-67 70%, HER-2 positive, T1 CN 0 stage Ia  Pathology and radiology counseling: Discussed with the patient, the details of pathology including the type of breast cancer,the clinical staging, the significance of ER, PR and HER-2/neu receptors and the implications for treatment. After reviewing the pathology in detail, we proceeded to discuss the different treatment options between surgery, radiation, chemotherapy, antiestrogen therapies.  Recommendation: 1.  Neoadjuvant chemotherapy with Taxol Herceptin followed by Herceptin maintenance for 1 year 2. breast conserving surgery with sentinel lymph node biopsy 3.  Radiation therapy 4.  Adjuvant antiestrogen therapy  Plan: 1.  Breast MRI 2. port placement 3.  Echocardiogram 4.  Chemo class  Return to clinic in 1 to 2 weeks to start chemotherapy

## 2017-11-19 NOTE — H&P (Signed)
Jackqulyn Mendel Plaskett Documented: 11/19/2017 7:29 AM Location: [facility] Patient #: [id number] DOB: Feb 20, 1966 Undefined / Language: [name] / Race: White Female  History of Present Illness [name] A. Tetsuo Coppola MD; 11/19/2017 11:13 AM) Patient words: Pt sent at the request of Dr Lisbeth Renshaw for mass right breast notice for 1 month in the upper outer quadrant. Mammogram noted a 1.7 cm mass IDC grade 3 triple positive. No other complaints except the mass. Pt denies pain, nipple discharge or other abnormality.  The patient is a 52 year old female.   Past Surgical History Tawni Pummel, RN; 11/19/2017 7:30 AM) Appendectomy Breast Biopsy Right. Cesarean Section - 1  Diagnostic Studies History Tawni Pummel, RN; 11/19/2017 7:30 AM) Colonoscopy within last year Mammogram within last year Pap Smear 1-5 years ago  Medication History Tawni Pummel, RN; 11/19/2017 7:30 AM) Medications Reconciled  Social History Tawni Pummel, RN; 11/19/2017 7:30 AM) No alcohol use No caffeine use No drug use Tobacco use Never smoker.  Family History Tawni Pummel, RN; 11/19/2017 7:30 AM) Colon Cancer Father. Heart Disease Mother. Hypertension Brother. Prostate Cancer Father.  Pregnancy / Birth History Tawni Pummel, RN; 11/19/2017 7:30 AM) Age at menarche 63 years. Age of menopause 67-55 Gravida 2 Maternal age 3-35 Para 2  Other Problems Tawni Pummel, RN; 11/19/2017 7:30 AM) Hypercholesterolemia     Review of Systems [name] A. Zane Samson MD; 11/19/2017 11:12 AM) Skin Not Present- Change in Wart/Mole, Dryness, Hives, Jaundice, New Lesions, Non-Healing Wounds, Rash and Ulcer. Respiratory Not Present- Bloody sputum, Chronic Cough, Difficulty Breathing, Snoring and Wheezing. Breast Not Present- Breast Mass, Breast Pain, Nipple Discharge and Skin Changes. Cardiovascular Not Present- Chest Pain, Difficulty Breathing Lying Down, Leg Cramps, Palpitations, Rapid Heart  Rate, Shortness of Breath and Swelling of Extremities. Gastrointestinal Not Present- Abdominal Pain, Bloating, Bloody Stool, Change in Bowel Habits, Chronic diarrhea, Constipation, Difficulty Swallowing, Excessive gas, Gets full quickly at meals, Hemorrhoids, Indigestion, Nausea, Rectal Pain and Vomiting. Female Genitourinary Not Present- Frequency, Nocturia, Painful Urination, Pelvic Pain and Urgency. Musculoskeletal Not Present- Back Pain, Joint Pain, Joint Stiffness, Muscle Pain, Muscle Weakness and Swelling of Extremities. Neurological Not Present- Decreased Memory, Fainting, Headaches, Numbness, Seizures, Tingling, Tremor, Trouble walking and Weakness. Psychiatric Present- Depression. Not Present- Anxiety, Bipolar, Change in Sleep Pattern, Fearful and Frequent crying. Endocrine Present- Hot flashes. Not Present- Cold Intolerance, Excessive Hunger, Hair Changes, Heat Intolerance and New Diabetes. Hematology Not Present- Blood Thinners, Easy Bruising, Excessive bleeding, Gland problems, HIV and Persistent Infections. All other systems negative   Physical Exam (Roe Koffman A. Ciana Simmon MD; 11/19/2017 11:13 AM)  General Mental Status-Alert. General Appearance-Consistent with stated age. Hydration-Well hydrated. Voice-Normal.  Head and Neck Head-normocephalic, atraumatic with no lesions or palpable masses. Trachea-midline. Thyroid Gland Characteristics - normal size and consistency.  Eye Eyeball - Bilateral-Extraocular movements intact. Sclera/Conjunctiva - Bilateral-No scleral icterus.  Chest and Lung Exam Chest and lung exam reveals -quiet, even and easy respiratory effort with no use of accessory muscles and on auscultation, normal breath sounds, no adventitious sounds and normal vocal resonance. Inspection Chest Wall - Normal. Back - normal.  Breast Note: 1 cm mass right breast UOQ mobile left breast normal.  Neurologic Neurologic evaluation reveals -alert and  oriented x 3 with no impairment of recent or remote memory. Mental Status-Normal.  Musculoskeletal Normal Exam - Left-Upper Extremity Strength Normal and Lower Extremity Strength Normal. Normal Exam - Right-Upper Extremity Strength Normal and Lower Extremity Strength Normal.  Lymphatic Head & Neck  General Head & Neck Lymphatics: Bilateral -  Description - Normal. Axillary  General Axillary Region: Bilateral - Description - Normal. Tenderness - Non Tender.    Assessment & Plan (Korene Dula A. Kaeleb Emond MD; 11/19/2017 11:16 AM)  BREAST CANCER, RIGHT (C50.911) Impression: pt has opted for neoadjuvant chemotherapy and needs port placement breast conservation to follow  MRI scheduled Pt requires port placement for chemotherapy. Risk include bleeding, infection, pneumothorax, hemothorax, mediastinal injury, nerve injury , blood vessel injury, strke, blood clots, death, migration. embolization and need for additional procedures. Pt agrees to proceed.  Current Plans You are being scheduled for surgery- Our schedulers will call you.  You should hear from our office's scheduling department within 5 working days about the location, date, and time of surgery. We try to make accommodations for patient's preferences in scheduling surgery, but sometimes the OR schedule or the surgeon's schedule prevents [place] from making those accommodations.  If you have not heard from our office [phone]) in 5 working days, call the office and ask for your surgeon's nurse.  If you have other questions about your diagnosis, plan, or surgery, call the office and ask for your surgeon's nurse.  Pt Education - [facility] Breast Cancer Information Given - [facility] "Breast Journey" Package We discussed the staging and pathophysiology of breast cancer. We discussed all of the different options for treatment for breast cancer including surgery, chemotherapy, radiation therapy, Herceptin, and antiestrogen therapy. We  discussed a sentinel lymph node biopsy as she does not appear to having lymph node involvement right now. We discussed the performance of that with injection of radioactive tracer and blue dye. We discussed that she would have an incision underneath her axillary hairline. We discussed that there is a bout a 10-20% chance of having a positive node with a sentinel lymph node biopsy and we will await the permanent pathology to make any other first further decisions in terms of her treatment. One of these options might be to return to the operating room to perform an axillary lymph node dissection. We discussed about a 1-2% risk lifetime of chronic shoulder pain as well as lymphedema associated with a sentinel lymph node biopsy. We discussed the options for treatment of the breast cancer which included lumpectomy versus a mastectomy. We discussed the performance of the lumpectomy with a wire placement. We discussed a 10-20% chance of a positive margin requiring reexcision in the operating room. We also discussed that she may need radiation therapy or antiestrogen therapy or both if she undergoes lumpectomy. We discussed the mastectomy and the postoperative care for that as well. We discussed that there is no difference in her survival whether she undergoes lumpectomy with radiation therapy or antiestrogen therapy versus a mastectomy. There is a slight difference in the local recurrence rate being 3-5% with lumpectomy and about 1% with a mastectomy. We discussed the risks of operation including bleeding, infection, possible reoperation. She understands her further therapy will be based on what her stages at the time of her operation.  Pt Education - [facility] Portacath HCI Use of a central venous catheter for intravenous therapy was discussed. Technique of catheter placement using ultrasound and fluoroscopy guidance was discussed. Risks such as bleeding, infection, pneumothorax, catheter occlusion, reoperation, and  other risks were discussed. I noted a good likelihood this will help address the problem. Questions were answered. The patient expressed understanding & wishes to proceed.

## 2017-11-19 NOTE — Telephone Encounter (Signed)
Phone call to patient regarding her new diagnosis of breast cancer.   Her breast cancer is estrogen and progesterone receptor positive.  She is HER2 positive.  She just had her oncology visit today and feels confident with her oncologist.  Is feeling a little bit overwhelmed, however.   She has a Mirena IUD. I told her I would reach out to her oncologist regarding this.   I offered support for her breast cancer journey.

## 2017-11-19 NOTE — Patient Instructions (Signed)

## 2017-11-19 NOTE — Progress Notes (Signed)
Radiation Oncology         [phone] ________________________________  Name: Sandra [name]        MRN: [id number]  Date of Service: 11/19/2017 DOB: Nov 18, 1965  OX:[id number], Sandra Chen, DO  Erroll Luna, MD     REFERRING PHYSICIAN: Erroll Luna, MD   DIAGNOSIS: The encounter diagnosis was Malignant neoplasm of upper-outer quadrant of right breast in female, estrogen receptor positive ([place]).   HISTORY OF PRESENT ILLNESS: Sandra [name] is a 52 y.o. female seen in the multidisciplinary breast clinic for a new diagnosis of right breast cancer. The patient was noted to have a palpable mass in the right breast which prompted diagnostic imaging. This revealed a 1.7 x 1.6 x 1.6 cm mass at 10:00. Her axilla was negative for adenopathy. A biopsy on 11/11/17 which revealed a grade 3 invasive ductal carcinoma, triple positive with a Ki 67 of 70%. She comes today to discuss treatment of her cancer.   PREVIOUS RADIATION THERAPY: No   PAST MEDICAL HISTORY:  Past Medical History:  Diagnosis Date  . Depression   . Endometriosis, diagnosis via laparoscopy   . Hyperlipidemia 2017  . Lichen sclerosus, vulva   . Low serum vitamin D 2017       PAST SURGICAL HISTORY: Past Surgical History:  Procedure Laterality Date  . APPENDECTOMY  2005  . CESAREAN SECTION  2002  . MANDIBLE RECONSTRUCTION  1985  . PELVIC LAPAROSCOPY  2004  . SHIN SURGERY  1998     FAMILY HISTORY:  Family History  Problem Relation Age of Onset  . Stroke Mother   . Heart disease Mother   . Colon cancer Father   . Hypertension Brother   . Breast cancer Neg Hx      SOCIAL HISTORY:  reports that she has never smoked. She has never used smokeless tobacco. She reports that she does not drink alcohol or use drugs. The patient is married and lives in [place]. 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    ALLERGIES: Patient has no known allergies.   MEDICATIONS:  Current Outpatient Medications    Medication Sig Dispense Refill  . betamethasone valerate ointment (VALISONE) 0.1 % Apply bid for 1-2 weeks as needed for flare of lichen sclerosis. 15 g 1  . levonorgestrel (MIRENA) 20 MCG/24HR IUD 1 each by Intrauterine route once.    . Omega-3 Fatty Acids (FISH OIL) 1000 MG CAPS Take 1 capsule by mouth daily.    . Red Yeast Rice Extract (RED YEAST RICE PO) Take 3 tablets by mouth daily.    Marland Kitchen venlafaxine XR (EFFEXOR-XR) 75 MG 24 hr capsule Take 75 mg by mouth daily.  1   No current facility-administered medications for this visit.      REVIEW OF SYSTEMS: On review of systems, the patient reports that she is doing well overall. She denies any chest pain, shortness of breath, cough, fevers, chills, night sweats, unintended weight changes. She denies any bowel or bladder disturbances, and denies abdominal pain, nausea or vomiting. She denies any new musculoskeletal or joint aches or pains. A complete review of systems is obtained and is otherwise negative.     PHYSICAL EXAM:  Wt Readings from Last 3 Encounters:  11/06/17 216 lb (98 kg)  04/21/17 216 lb 12.8 oz (98.3 kg)  12/26/16 211 lb (95.7 kg)   Temp Readings from Last 3 Encounters:  04/21/17 98.4 F (36.9 C) (Oral)  01/28/14 98.3 F (36.8 C) (Oral)  BP Readings from Last 3 Encounters:  11/06/17 122/78  04/21/17 112/78  12/26/16 108/64   Pulse Readings from Last 3 Encounters:  11/06/17 80  04/21/17 83  12/26/16 76     In general this is a well appearing Caucasian female in no acute distress. She is alert and oriented x4 and appropriate throughout the examination. HEENT reveals that the patient is normocephalic, atraumatic. EOMs are intact. Cardiopulmonary assessment is negative for acute distress and she exhibits normal effort. Breast exam is deferred.    ECOG = 1  0 - Asymptomatic (Fully active, able to carry on all predisease activities without restriction)  1 - Symptomatic but completely ambulatory (Restricted in  physically strenuous activity but ambulatory and able to carry out work of a light or sedentary nature. For example, light housework, office work)  2 - Symptomatic, <50% in bed during the day (Ambulatory and capable of all self care but unable to carry out any work activities. Up and about more than 50% of waking hours)  3 - Symptomatic, >50% in bed, but not bedbound (Capable of only limited self-care, confined to bed or chair 50% or more of waking hours)  4 - Bedbound (Completely disabled. Cannot carry on any self-care. Totally confined to bed or chair)  5 - Death   Eustace Pen MM, Creech RH, Tormey DC, et al. [phone]). "Toxicity and response criteria of the [facility]". [place] Oncol. 5 (6): 649-55    LABORATORY DATA:  Lab Results  Component Value Date   WBC 4.0 12/26/2016   HGB 13.3 12/26/2016   HCT 39.2 12/26/2016   MCV 90 12/26/2016   PLT 257 12/26/2016   Lab Results  Component Value Date   NA 137 04/21/2017   K 4.5 04/21/2017   CL 102 04/21/2017   CO2 31 04/21/2017   Lab Results  Component Value Date   ALT 13 04/21/2017   AST 15 04/21/2017   ALKPHOS 46 04/21/2017   BILITOT 0.4 04/21/2017      RADIOGRAPHY: US Breast Ltd Uni Right Inc Axilla  Result Date: 11/10/2017 CLINICAL DATA:  Patient presents with palpable lump in the upper outer right breast. EXAM: DIGITAL DIAGNOSTIC BILATERAL MAMMOGRAM WITH CAD AND TOMO ULTRASOUND RIGHT BREAST COMPARISON:  Previous exam(s). ACR Breast Density Category c: The breast tissue is heterogeneously dense, which may obscure small masses. FINDINGS: There is a spiculated mass with associated architectural distortion in the upper outer quadrant of the right breast that corresponds to the palpable abnormality. There is a circumscribed oval mass in the central breast, just medial to midline. There are no other discrete masses. There are no other areas of architectural distortion. There are no new or suspicious  calcifications. Mammographic images were processed with CAD. On physical exam, there is a firm mass in upper outer quadrant right breast. Targeted ultrasound is performed, showing an irregular mass in the 10 o'clock position of the right breast, 4 cm the nipple, with ill-defined margins. Mass is hypoechoic with posterior shadowing. It measures 1.7 x 1.6 x 1.6 cm. In the right breast at 3 o'clock, 2 cm from the nipple, central depth, there is a simple cyst measuring 7 mm corresponding to the benign mass seen mammographically. Sonographic evaluation of the right axilla shows no enlarged or abnormal lymph nodes. IMPRESSION: 1.7 cm irregular mass in the upper outer quadrant of the right breast corresponding to palpable abnormality. This is highly suspicious breast malignancy. No other evidence of malignancy. No axillary adenopathy. RECOMMENDATION:  1. Ultrasound-guided core needle biopsy the upper-outer quadrant right breast mass. I have discussed the findings and recommendations with the patient. Results were also provided in writing at the conclusion of the visit. If applicable, a reminder letter will be sent to the patient regarding the next appointment. BI-RADS CATEGORY  5: Highly suggestive of malignancy. Electronically Signed   By: Lajean Manes M.D.   On: 11/10/2017 11:04   Mm Diag Breast Tomo Bilateral  Result Date: 11/10/2017 CLINICAL DATA:  Patient presents with palpable lump in the upper outer right breast. EXAM: DIGITAL DIAGNOSTIC BILATERAL MAMMOGRAM WITH CAD AND TOMO ULTRASOUND RIGHT BREAST COMPARISON:  Previous exam(s). ACR Breast Density Category c: The breast tissue is heterogeneously dense, which may obscure small masses. FINDINGS: There is a spiculated mass with associated architectural distortion in the upper outer quadrant of the right breast that corresponds to the palpable abnormality. There is a circumscribed oval mass in the central breast, just medial to midline. There are no other discrete  masses. There are no other areas of architectural distortion. There are no new or suspicious calcifications. Mammographic images were processed with CAD. On physical exam, there is a firm mass in upper outer quadrant right breast. Targeted ultrasound is performed, showing an irregular mass in the 10 o'clock position of the right breast, 4 cm the nipple, with ill-defined margins. Mass is hypoechoic with posterior shadowing. It measures 1.7 x 1.6 x 1.6 cm. In the right breast at 3 o'clock, 2 cm from the nipple, central depth, there is a simple cyst measuring 7 mm corresponding to the benign mass seen mammographically. Sonographic evaluation of the right axilla shows no enlarged or abnormal lymph nodes. IMPRESSION: 1.7 cm irregular mass in the upper outer quadrant of the right breast corresponding to palpable abnormality. This is highly suspicious breast malignancy. No other evidence of malignancy. No axillary adenopathy. RECOMMENDATION: 1. Ultrasound-guided core needle biopsy the upper-outer quadrant right breast mass. I have discussed the findings and recommendations with the patient. Results were also provided in writing at the conclusion of the visit. If applicable, a reminder letter will be sent to the patient regarding the next appointment. BI-RADS CATEGORY  5: Highly suggestive of malignancy. Electronically Signed   By: Lajean Manes M.D.   On: 11/10/2017 11:04   Mm Clip Placement Right  Result Date: 11/11/2017 CLINICAL DATA:  Post biopsy mammogram of the right breast for clip placement. EXAM: DIAGNOSTIC RIGHT MAMMOGRAM POST ULTRASOUND BIOPSY COMPARISON:  Previous exam(s). FINDINGS: Mammographic images were obtained following ultrasound guided biopsy of a mass in the right breast at 10 o'clock. The ribbon shaped biopsy marking clip is well positioned at the site of biopsy at 10 o'clock in the right breast. IMPRESSION: Appropriate positioning of the ribbon shaped biopsy marking clip within the mass in the  upper-outer right breast. Final Assessment: Post Procedure Mammograms for Marker Placement Electronically Signed   By: Ammie Ferrier M.D.   On: 11/11/2017 13:39   [place] Rt Breast Bx W Loc Dev 1st Lesion Img Bx Spec US Guide  Addendum Date: 11/13/2017   ADDENDUM REPORT: 11/13/2017 07:33 ADDENDUM: Pathology revealed GRADE III INVASIVE DUCTAL CARCINOMA, HIGH GRADE DUCTAL CARCINOMA IN SITU, CALCIFICATIONS of the Right breast, 10 o'clock. This was found to be concordant by Dr. Ammie Ferrier. Pathology results were discussed with the patient by telephone. The patient reported doing well after the biopsy with tenderness at the site. Post biopsy instructions and care were reviewed and questions were answered. The patient was encouraged  to call [facility] for any additional concerns. The patient was referred to [facility] on November 19, 2017. Pathology results reported by Terie Purser, RN on 11/13/2017. Electronically Signed   By: Ammie Ferrier M.D.   On: 11/13/2017 07:33   Result Date: 11/13/2017 CLINICAL DATA:  52 year old female presenting for ultrasound-guided biopsy of a right breast mass. EXAM: ULTRASOUND GUIDED RIGHT BREAST CORE NEEDLE BIOPSY COMPARISON:  Previous exam(s). FINDINGS: I met with the patient and we discussed the procedure of ultrasound-guided biopsy, including benefits and alternatives. We discussed the high likelihood of a successful procedure. We discussed the risks of the procedure, including infection, bleeding, tissue injury, clip migration, and inadequate sampling. Informed written consent was given. The usual time-out protocol was performed immediately prior to the procedure. Lesion quadrant: Upper-outer quadrant Using sterile technique and 1% Lidocaine as local anesthetic, under direct ultrasound visualization, a 14 gauge spring-loaded device was used to perform biopsy of a mass  in the right breast at 10 o'clock using an inferior approach. At the conclusion of the procedure a ribbon shaped tissue marker clip was deployed into the biopsy cavity. Follow up 2 view mammogram was performed and dictated separately. IMPRESSION: Ultrasound guided biopsy of a right breast mass at 10 o'clock. No apparent complications. Electronically Signed: By: Ammie Ferrier M.D. On: 11/11/2017 13:38       IMPRESSION/PLAN: 1. Stage IA, cT1cN0M0 grade 3 triple positive, invasive ductal carcinoma of the right breast. Dr. Lisbeth Renshaw discusses the pathology findings and reviews the nature of triple positive breast disease. The consensus from the breast conference includes offering neoadjuvant chemotherapy following staging work up and PAC placment. Following her chemotherapy, she would be a candidate for breast conservation with lumpectomy and sentinel node biopsy. Her course would then be followed by external radiotherapy to the breast followed by antiestrogen therapy. We discussed the risks, benefits, short, and long term effects of radiotherapy, and the patient is interested in proceeding. Dr. Lisbeth Renshaw discusses the delivery and logistics of radiotherapy and anticipates a course of 6 1/2 weeks of radiotherapy. We will see her back about 2 weeks after surgery to discuss the simulation process and anticipate we starting radiotherapy about 4-6 weeks after surgery.  2. Possible genetic predisposition to malignancy. The patient is a candidate for genetic testing given her personal history. She was offered referral and is being scheduled for this.   In a visit lasting 30 minutes, greater than 50% of the time was spent face to face discussing her case, and coordinating the patient's care.   The above documentation reflects my direct findings during this shared patient visit. Please see the separate note by Dr. Lisbeth Renshaw on this date for the remainder of the patient's plan of care.    Carola Rhine, PAC

## 2017-11-19 NOTE — Progress Notes (Signed)
START OFF PATHWAY REGIMEN - Breast   [id number]:Paclitaxel + Trastuzumab:   A cycle is every 28 days:     Paclitaxel      Trastuzumab-xxxx      Trastuzumab-xxxx   **Always confirm dose/schedule in your pharmacy ordering system**  Patient Characteristics: Preoperative or Nonsurgical Candidate (Clinical Staging), Neoadjuvant Therapy followed by Surgery, Invasive Disease, Chemotherapy, HER2 Positive, ER Positive Therapeutic Status: Preoperative or Nonsurgical Candidate (Clinical Staging) AJCC M Category: cM0 AJCC Grade: G3 Breast Surgical Plan: Neoadjuvant Therapy followed by Surgery ER Status: Positive (+) AJCC 8 Stage Grouping: IA HER2 Status: Positive (+) AJCC T Category: cT1c AJCC N Category: cN0 PR Status: Positive (+) Intent of Therapy: Curative Intent, Discussed with Patient

## 2017-11-19 NOTE — Progress Notes (Signed)
Clinical Social Work [place] Psychosocial Distress Screening [place]  Patient completed distress screening protocol and scored a 9 on the Psychosocial Distress Thermometer which indicates severe distress. Clinical Social Worker met with patient and patients husband in [facility] to assess for distress and other psychosocial needs. Patient stated she was feeling overwhelmed but felt "better" after meeting with the treatment team and getting more information on her treatment plan. CSW and patient discussed common feeling and emotions when being diagnosed with cancer, and the importance of support during treatment.  CSW and patient also discussed the impact her diagnosis would have on her 2 daughters (4 and 43).  CSW and patient discussed "tips on talking with your children" and provided counseling information.  CSW informed patient of the support team and support services at [facility]. CSW provided contact information and encouraged patient to call with any questions or concerns.  ONCBCN DISTRESS SCREENING 11/19/2017  Screening Type Initial Screening  Distress experienced in past week (1-10) 9  Family Problem type Partner;Children  Emotional problem type Depression;Nervousness/Anxiety;Adjusting to illness;Isolation/feeling alone;Feeling hopeless  Spiritual/Religous concerns type Loss of sense of purpose  Information Concerns Type Lack of info about complementary therapy choices     Johnnye Lana, MSW, LCSW, OSW-C Clinical Social Worker [place] [phone]

## 2017-11-19 NOTE — Progress Notes (Signed)
[facility] NOTE  Patient Care Team: Briscoe Deutscher, DO as PCP - General (Family Medicine) Macario Carls, MD as Referring Physician (Specialist) Erroll Luna, MD as Consulting Physician (General Surgery) Nicholas Lose, MD as Consulting Physician (Hematology and Oncology) Kyung Rudd, MD as Consulting Physician (Radiation Oncology)  CHIEF COMPLAINTS/PURPOSE OF CONSULTATION:  Newly diagnosed breast cancer  HISTORY OF PRESENTING ILLNESS:  Sandra [name] 51 y.o. female is here because of recent diagnosis of right breast cancer.  Patient felt a lump in the right breast at 10 o'clock position measuring 1.7 cm.  Biopsy of which revealed grade 3 invasive ductal carcinoma that was ER PR positive and HER-2 positive with a Ki-67 of 70%.  She was presented this morning to the multidisciplinary tumor board and she is here today accompanied by her family to discuss the treatment plan.  She has extensive family history of cancers including father prostate cancer at 47 and a paternal grandmother had breast cancer.  I reviewed her records extensively and collaborated the history with the patient.  SUMMARY OF ONCOLOGIC HISTORY:   Malignant neoplasm of upper-outer quadrant of right breast in female, estrogen receptor positive ([place])   11/11/2017 Initial Diagnosis    Palpable right breast mass at 10 o'clock position 1.7 cm, axilla negative, biopsy revealed grade 3 IDC ER 60%, PR 10%, Ki-67 70%, HER-2 positive, T1 CN 0 stage Ia    11/19/2017 Cancer Staging    Staging form: Breast, AJCC 8th Edition - Clinical: Stage IA (cT1c, cN0, cM0, G3, ER+, PR+, HER2+) - Signed by Nicholas Lose, MD on 11/19/2017    MEDICAL HISTORY:  Past Medical History:  Diagnosis Date  . Depression   . Endometriosis, diagnosis via laparoscopy   . Hyperlipidemia 2017  . Lichen sclerosus, vulva   . Low serum vitamin D 2017    SURGICAL HISTORY: Past Surgical History:  Procedure Laterality Date  .  APPENDECTOMY  2005  . CESAREAN SECTION  2002  . MANDIBLE RECONSTRUCTION  1985  . PELVIC LAPAROSCOPY  2004  . SHIN SURGERY  1998    SOCIAL HISTORY: Social History   Socioeconomic History  . Marital status: Married    Spouse name: Not on file  . Number of children: Not on file  . Years of education: Not on file  . Highest education level: Not on file  Occupational History  . Not on file  Social Needs  . Financial resource strain: Not on file  . Food insecurity:    Worry: Not on file    Inability: Not on file  . Transportation needs:    Medical: Not on file    Non-medical: Not on file  Tobacco Use  . Smoking status: Never Smoker  . Smokeless tobacco: Never Used  Substance and Sexual Activity  . Alcohol use: No    Alcohol/week: 0.0 standard drinks  . Drug use: No  . Sexual activity: Yes    Partners: Male    Birth control/protection: IUD    Comment: Mirena inserted 05/16/16  Lifestyle  . Physical activity:    Days per week: Not on file    Minutes per session: Not on file  . Stress: Not on file  Relationships  . Social connections:    Talks on phone: Not on file    Gets together: Not on file    Attends religious service: Not on file    Active member of club or organization: Not on file    Attends meetings of  clubs or organizations: Not on file    Relationship status: Not on file  . Intimate partner violence:    Fear of current or ex partner: Not on file    Emotionally abused: Not on file    Physically abused: Not on file    Forced sexual activity: Not on file  Other Topics Concern  . Not on file  Social History Narrative  . Not on file    FAMILY HISTORY: Family History  Problem Relation Age of Onset  . Stroke Mother   . Heart disease Mother   . Colon cancer Father   . Testicular cancer Father   . Hypertension Brother   . Breast cancer Neg Hx     ALLERGIES:  has No Known Allergies.  MEDICATIONS:  Current Outpatient Medications  Medication Sig  Dispense Refill  . betamethasone valerate ointment (VALISONE) 0.1 % Apply bid for 1-2 weeks as needed for flare of lichen sclerosis. 15 g 1  . levonorgestrel (MIRENA) 20 MCG/24HR IUD 1 each by Intrauterine route once.    . Omega-3 Fatty Acids (FISH OIL) 1000 MG CAPS Take 1 capsule by mouth daily.    Marland Kitchen venlafaxine XR (EFFEXOR-XR) 75 MG 24 hr capsule Take 75 mg by mouth daily.  1   No current facility-administered medications for this visit.     REVIEW OF SYSTEMS:   Constitutional: Denies fevers, chills or abnormal night sweats Eyes: Denies blurriness of vision, double vision or watery eyes Ears, nose, mouth, throat, and face: Denies mucositis or sore throat Respiratory: Denies cough, dyspnea or wheezes Cardiovascular: Denies palpitation, chest discomfort or lower extremity swelling Gastrointestinal:  Denies nausea, heartburn or change in bowel habits Skin: Denies abnormal skin rashes Lymphatics: Denies new lymphadenopathy or easy bruising Neurological:Denies numbness, tingling or new weaknesses Behavioral/Psych: Mood is stable, no new changes  Breast: Palpable lump in the right breast All other systems were reviewed with the patient and are negative.  PHYSICAL EXAMINATION: ECOG PERFORMANCE STATUS: 1 - Symptomatic but completely ambulatory  Vitals:   11/19/17 0907  BP: (!) 149/82  Pulse: 78  Resp: 18  Temp: 98.4 F (36.9 C)  SpO2: 100%   Filed Weights   11/19/17 0907  Weight: 217 lb 8 oz (98.7 kg)    GENERAL:alert, no distress and comfortable SKIN: skin color, texture, turgor are normal, no rashes or significant lesions EYES: normal, conjunctiva are pink and non-injected, sclera clear OROPHARYNX:no exudate, no erythema and lips, buccal mucosa, and tongue normal  NECK: supple, thyroid normal size, non-tender, without nodularity LYMPH:  no palpable lymphadenopathy in the cervical, axillary or inguinal LUNGS: clear to auscultation and percussion with normal breathing  effort HEART: regular rate & rhythm and no murmurs and no lower extremity edema ABDOMEN:abdomen soft, non-tender and normal bowel sounds Musculoskeletal:no cyanosis of digits and no clubbing  PSYCH: alert & oriented x 3 with fluent speech NEURO: no focal motor/sensory deficits BREAST: Small palpable lump in the right breast accompanied by bruising from recent biopsies. No palpable axillary or supraclavicular lymphadenopathy (exam performed in the presence of a chaperone)   LABORATORY DATA:  I have reviewed the data as listed Lab Results  Component Value Date   WBC 4.3 11/19/2017   HGB 13.1 11/19/2017   HCT 39.2 11/19/2017   MCV 89.8 11/19/2017   PLT 249 11/19/2017   Lab Results  Component Value Date   NA 139 11/19/2017   K 3.9 11/19/2017   CL 104 11/19/2017   CO2 27 11/19/2017  RADIOGRAPHIC STUDIES: I have personally reviewed the radiological reports and agreed with the findings in the report.  ASSESSMENT AND PLAN:  Malignant neoplasm of upper-outer quadrant of right breast in female, estrogen receptor positive ([place]) 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%, PR 10%, Ki-67 70%, HER-2 positive, T1 CN 0 stage Ia  Pathology and radiology counseling: Discussed with the patient, the details of pathology including the type of breast cancer,the clinical staging, the significance of ER, PR and HER-2/neu receptors and the implications for treatment. After reviewing the pathology in detail, we proceeded to discuss the different treatment options between surgery, radiation, chemotherapy, antiestrogen therapies.  Recommendation: 1.  Neoadjuvant chemotherapy with Taxol Herceptin followed by Herceptin maintenance for 1 year 2. breast conserving surgery with sentinel lymph node biopsy 3.  Radiation therapy 4.  Adjuvant antiestrogen therapy  Plan: 1.  Breast MRI 2. port placement 3.  Echocardiogram 4.  Chemo  class  Return to clinic in 1 to 2 weeks to start chemotherapy   All questions were answered. The patient knows to call the clinic with any problems, questions or concerns.    Harriette Ohara, MD 11/19/17

## 2017-11-21 ENCOUNTER — Encounter: Payer: Self-pay | Admitting: *Deleted

## 2017-11-21 DIAGNOSIS — Z006 Encounter for examination for normal comparison and control in clinical research program: Secondary | ICD-10-CM

## 2017-11-21 NOTE — Telephone Encounter (Signed)
-----   Message from Nicholas Lose, MD sent at 11/20/2017  5:17 PM EDT ----- Regarding: RE: Mirena IUD removal Yes please remove it Thank you very much Vinay ----- Message ----- From: Nunzio Cobbs, MD Sent: 11/20/2017   8:51 AM EDT To: Nicholas Lose, MD Subject: Mirena IUD removal                             Good morning Dr. Lindi Adie,   I am the gynecologist caring for [facility], who was just diagnosed with breast cancer.   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  I assume you would like for me to remove her IUD?   Thank you for your input.   Josefa Half, MD [place]

## 2017-11-21 NOTE — Telephone Encounter (Signed)
Left message to call Sy Saintjean at [phone].  

## 2017-11-21 NOTE — Telephone Encounter (Signed)
Please contact patient regarding Dr. Geralyn Flash recommendation for Mirena IUD removal.  Please precert and schedule appointment.   I would recommend we check her [place] at the office visit so I can also make recommendations regarding future contraception.

## 2017-11-21 NOTE — Telephone Encounter (Signed)
Patient left message over lunch returning call to [place].

## 2017-11-24 ENCOUNTER — Ambulatory Visit (HOSPITAL_COMMUNITY)
Admission: RE | Admit: 2017-11-24 | Discharge: 2017-11-24 | Disposition: A | Discharge status: Home / Self Care | Payer: BLUE CROSS/BLUE SHIELD | Source: Ambulatory Visit | Attending: Hematology and Oncology | Admitting: Hematology and Oncology

## 2017-11-24 DIAGNOSIS — N6320 Unspecified lump in the left breast, unspecified quadrant: Secondary | ICD-10-CM | POA: Insufficient documentation

## 2017-11-24 DIAGNOSIS — C50411 Malignant neoplasm of upper-outer quadrant of right female breast: Secondary | ICD-10-CM | POA: Insufficient documentation

## 2017-11-24 DIAGNOSIS — D3121 Benign neoplasm of right retina: Secondary | ICD-10-CM | POA: Diagnosis not present

## 2017-11-24 DIAGNOSIS — Z170 Estrogen receptor positive status [ER+]: Secondary | ICD-10-CM | POA: Diagnosis not present

## 2017-11-24 DIAGNOSIS — D0511 Intraductal carcinoma in situ of right breast: Secondary | ICD-10-CM | POA: Diagnosis not present

## 2017-11-24 MED ORDER — GADOBENATE DIMEGLUMINE 529 MG/ML IV SOLN
20.0000 mL | Freq: Once | INTRAVENOUS | Status: AC | PRN
  Administered 2017-11-24: 20 mL via INTRAVENOUS

## 2017-11-24 NOTE — Telephone Encounter (Signed)
Spoke with patient, advised as seen below per Dr. Quincy Simmonds. OV scheduled for 8/29 at 2:30pm with Dr. Quincy Simmonds for IUD removal. Patient is aware she will be called prior to appt to review benefits.   Routing to [employer] for [employer].   Encounter closed.   Cc: Dr. Quincy Simmonds

## 2017-11-24 NOTE — Telephone Encounter (Signed)
Thank you for the update!

## 2017-11-24 NOTE — Telephone Encounter (Signed)
Spoke with patient. Advised as seen below per Dr. Quincy Simmonds. Patient asking if IUD can be removed at AEX on 01/14/18? Last AEX  12/26/16, patient also agreeable to moving up AEX if ok to remove at AEX.   Order placed for IUD removal for precert  Dr. Quincy Simmonds -ok to wait until AEX ?

## 2017-11-24 NOTE — Telephone Encounter (Signed)
Spoke with patient. Patient is unable to talk, will return call.

## 2017-11-24 NOTE — Telephone Encounter (Signed)
Her IUD can be removed at any time, but I think the oncologist would prefer to have it removed sooner than later.  Send to precert if not already done.

## 2017-11-25 ENCOUNTER — Inpatient Hospital Stay: Payer: BLUE CROSS/BLUE SHIELD

## 2017-11-25 ENCOUNTER — Ambulatory Visit (HOSPITAL_COMMUNITY)
Admission: RE | Admit: 2017-11-25 | Discharge: 2017-11-25 | Disposition: A | Discharge status: Home / Self Care | Payer: BLUE CROSS/BLUE SHIELD | Source: Ambulatory Visit | Attending: Hematology and Oncology | Admitting: Hematology and Oncology

## 2017-11-25 DIAGNOSIS — E785 Hyperlipidemia, unspecified: Secondary | ICD-10-CM | POA: Insufficient documentation

## 2017-11-25 DIAGNOSIS — Z170 Estrogen receptor positive status [ER+]: Secondary | ICD-10-CM | POA: Diagnosis not present

## 2017-11-25 DIAGNOSIS — C50411 Malignant neoplasm of upper-outer quadrant of right female breast: Secondary | ICD-10-CM | POA: Diagnosis not present

## 2017-11-25 NOTE — Progress Notes (Signed)
  Echocardiogram 2D Echocardiogram has been performed.  Sandra [name] 11/25/2017, 1:27 PM

## 2017-11-26 ENCOUNTER — Other Ambulatory Visit: Payer: Self-pay

## 2017-11-26 ENCOUNTER — Other Ambulatory Visit: Payer: Self-pay | Admitting: Hematology and Oncology

## 2017-11-26 ENCOUNTER — Encounter (HOSPITAL_BASED_OUTPATIENT_CLINIC_OR_DEPARTMENT_OTHER): Payer: Self-pay | Admitting: *Deleted

## 2017-11-26 ENCOUNTER — Telehealth: Payer: Self-pay | Admitting: Hematology and Oncology

## 2017-11-26 ENCOUNTER — Other Ambulatory Visit: Payer: Self-pay | Admitting: Surgery

## 2017-11-26 DIAGNOSIS — N6310 Unspecified lump in the right breast, unspecified quadrant: Secondary | ICD-10-CM

## 2017-11-26 DIAGNOSIS — C50919 Malignant neoplasm of unspecified site of unspecified female breast: Secondary | ICD-10-CM

## 2017-11-26 NOTE — Progress Notes (Signed)
Patient aware to come in to [facility] to pick up ensure pre surgery drink between 7-3 pm any day up to the day before surgery.

## 2017-11-26 NOTE — Telephone Encounter (Signed)
Scheduled appt per 8/26 sch message - left message for patient and sent reminder letter in the mail.

## 2017-11-27 ENCOUNTER — Telehealth: Payer: Self-pay | Admitting: *Deleted

## 2017-11-27 ENCOUNTER — Ambulatory Visit (INDEPENDENT_AMBULATORY_CARE_PROVIDER_SITE_OTHER): Payer: BLUE CROSS/BLUE SHIELD | Admitting: Obstetrics and Gynecology

## 2017-11-27 ENCOUNTER — Encounter: Payer: Self-pay | Admitting: Obstetrics and Gynecology

## 2017-11-27 VITALS — BP 120/62 | HR 104 | Resp 16 | Ht 70.5 in | Wt 218.0 lb

## 2017-11-27 DIAGNOSIS — Z3009 Encounter for other general counseling and advice on contraception: Secondary | ICD-10-CM

## 2017-11-27 DIAGNOSIS — Z30432 Encounter for removal of intrauterine contraceptive device: Secondary | ICD-10-CM

## 2017-11-27 DIAGNOSIS — N951 Menopausal and female climacteric states: Secondary | ICD-10-CM

## 2017-11-27 MED ORDER — VENLAFAXINE HCL ER 75 MG PO CP24: 75.0000 mg | ORAL_CAPSULE | Freq: Every day | ORAL | 0 refills | Status: DC

## 2017-11-27 NOTE — Progress Notes (Signed)
GYNECOLOGY  VISIT   HPI: 52 y.o.   Married  Caucasian  female   G2P2002 with No LMP recorded (lmp unknown). (Menstrual status: IUD).   here for IUD removal.  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  Just had an MRI which has detected a possible second focus on her right breast..  She now will have an additional biopsy.  On Effexor right now and is in the process of changing this Rx.  She is weaning down on this in preparation for changing to another medication.  This is coming from the [facility]. She is unable to complete an appointment with her provider at this time. Patient currently does not want to change her medication.  No thoughts of suicide.   GYNECOLOGIC HISTORY: No LMP recorded (lmp unknown). (Menstrual status: IUD). Contraception:  IUD Menopausal hormone therapy:  n/a Last mammogram: 11/10/17 Diagnostic MM/Right Breast US BIRADS 5:Highly suggestive of malignancy/density c; 11/11/17 Clip Placement, breast bx -- pathology revealed grade III invasive ductal carcinoma, high grade carcinoma in situ, calcifications of the right breast at 10 o'clock; 11/24/17 MR Breast Bilateral W WO Contrast - BIRADS 4 Suspicious Last pap smear:   11/18/14 Neg:Neg HR HPV        OB History    Gravida  2   Para  2   Term  2   Preterm      AB      Living  2     SAB      TAB      Ectopic      Multiple      Live Births                 Patient Active Problem List   Diagnosis Date Noted  . Malignant neoplasm of upper-outer quadrant of right breast in female, estrogen receptor positive ([place]) 11/14/2017  . Melanocytic nevus of trunk 10/23/2017  . Other seborrheic keratosis 10/23/2017  . Hemangioma of skin and subcutaneous tissue 10/23/2017  . Other melanin hyperpigmentation 10/23/2017  . Skin mole 04/21/2017  . Depression, recurrent ([place]) 04/21/2017  . Obesity (BMI 30-39.9) 04/21/2017  . Pure hypercholesterolemia 04/21/2017  . Vitamin D  deficiency 04/21/2017  . Lichen sclerosus et atrophicus of the vulva 04/21/2017  . 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 Sleep related headaches 01/08/2016  . Bruxism 01/08/2016  . Menstrual headache 10/28/2013  . Other and unspecified hyperlipidemia 02/11/2013    Past Medical History:  Diagnosis Date  . Breast cancer ([place])   . Depression   . Endometriosis, diagnosis via laparoscopy   . Hyperlipidemia 2017  . Lichen sclerosus, vulva   . Low serum vitamin D 2017    Past Surgical History:  Procedure Laterality Date  . APPENDECTOMY  2005  . CESAREAN SECTION  2002  . MANDIBLE RECONSTRUCTION  1985   wires inside jaw according to pt  . PELVIC LAPAROSCOPY  2004  . [facility]  1998    Current Outpatient Medications  Medication Sig Dispense Refill  . betamethasone valerate ointment (VALISONE) 0.1 % Apply bid for 1-2 weeks as needed for flare of lichen sclerosis. 15 g 1  . levonorgestrel (MIRENA) 20 MCG/24HR IUD 1 each by Intrauterine route once.    . lidocaine-prilocaine (EMLA) cream Apply 1 application topically as needed. 30 g 6  . LORazepam (ATIVAN) 0.5 MG tablet Take 1 tablet (0.5 mg total) by mouth at bedtime as needed for sleep. 30 tablet 0  . Omega-3 Fatty  Acids (FISH OIL) 1000 MG CAPS Take 1 capsule by mouth daily.    . ondansetron (ZOFRAN) 8 MG tablet Take 1 tablet (8 mg total) by mouth 2 (two) times daily as needed (Nausea or vomiting). 30 tablet 1  . prochlorperazine (COMPAZINE) 10 MG tablet Take 1 tablet (10 mg total) by mouth every 6 (six) hours as needed (Nausea or vomiting). 30 tablet 1  . venlafaxine XR (EFFEXOR-XR) 75 MG 24 hr capsule Take 75 mg by mouth daily.  1   No current facility-administered medications for this visit.      ALLERGIES: Patient has no known allergies.  Family History  Problem Relation Age of Onset  . Stroke Mother   . Heart disease Mother   . Colon cancer Father   . Testicular cancer Father   . Hypertension Brother   . Breast cancer Neg Hx      Social History   Socioeconomic History  . Marital status: Married    Spouse name: Not on file  . Number of children: Not on file  . Years of education: Not on file  . Highest education level: Not on file  Occupational History  . Not on file  Social Needs  . Financial resource strain: Not on file  . Food insecurity:    Worry: Not on file    Inability: Not on file  . Transportation needs:    Medical: Not on file    Non-medical: Not on file  Tobacco Use  . Smoking status: Never Smoker  . Smokeless tobacco: Never Used  Substance and Sexual Activity  . Alcohol use: No    Alcohol/week: 0.0 standard drinks  . Drug use: No  . Sexual activity: Yes    Partners: Male    Birth control/protection: IUD    Comment: Mirena inserted 05/16/16, IUD is to be removed08/29/19  Lifestyle  . Physical activity:    Days per week: Not on file    Minutes per session: Not on file  . Stress: Not on file  Relationships  . Social connections:    Talks on phone: Not on file    Gets together: Not on file    Attends religious service: Not on file    Active member of club or organization: Not on file    Attends meetings of clubs or organizations: Not on file    Relationship status: Not on file  . Intimate partner violence:    Fear of current or ex partner: Not on file    Emotionally abused: Not on file    Physically abused: Not on file    Forced sexual activity: Not on file  Other Topics Concern  . Not on file  Social History Narrative  . Not on file    Review of Systems  All other systems reviewed and are negative.   PHYSICAL EXAMINATION:    BP 120/62 (BP Location: Right Arm, Patient Position: Sitting, Cuff Size: Large)   Pulse (!) 104   Resp 16   Ht 5' 10.5" (1.791 m)   Wt 218 lb (98.9 kg)   LMP  (LMP Unknown)   BMI 30.84 kg/m     General appearance: alert, cooperative and appears stated age  Pelvic: External genitalia:  no lesions              Urethra:  normal appearing  urethra with no masses, tenderness or lesions              Bartholins and Skenes: normal  Vagina: normal appearing vagina with normal color and discharge, no lesions              Cervix: no lesions.   IUD strings noted.   IUD removal.  Consent for procedure. IUD removed with ring forceps, shown to patient, and discarded.                 Bimanual Exam:  Uterus:  Normal size, contour, position, consistency, mobility, non-tender                               Adnexa:  No masses or tenderness.  Chaperone was present for exam.  ASSESSMENT  New dx of breast cancer.  Mirena IUD removal. Menopausal symptoms.  Depression.  On Effexor.   PLAN  IUD removed.  Use condoms and spermicide for pregnancy prevention.  I did mention a ParaGard IUD for future pregnancy prevention if needed. [place] and E2.  Rx for Effexor XR 75 mg, one month.  She will follow up with the [facility]. I offered support her her new diagnosis of breast cancer.  FU here prn.   An After Visit Summary was printed and given to the patient.  ___15___ minutes face to face time of which over 50% was spent in counseling.

## 2017-11-27 NOTE — Telephone Encounter (Signed)
Spoke to pt concerning [place] from 8.21.19. Denies questions or concerns regarding dx or treatment care plan. Discussed MRI results and US/bx as well as need for MR bx if unable to see via [place]. Received verbal understanding. Denies further questions or needs.

## 2017-11-27 NOTE — Progress Notes (Signed)
Ensure given, instructions reviewed

## 2017-11-28 ENCOUNTER — Encounter: Payer: Self-pay | Admitting: *Deleted

## 2017-11-28 DIAGNOSIS — Z170 Estrogen receptor positive status [ER+]: Secondary | ICD-10-CM

## 2017-11-28 DIAGNOSIS — C50411 Malignant neoplasm of upper-outer quadrant of right female breast: Secondary | ICD-10-CM

## 2017-11-28 LAB — FOLLICLE STIMULATING HORMONE: FSH: 37.4 m[IU]/mL

## 2017-11-28 LAB — ESTRADIOL: Estradiol: 55.3 pg/mL

## 2017-11-28 NOTE — Progress Notes (Signed)
BASELINE ACTIVITIES FOR UPBEAT ([id number]) STUDY; Patient into clinic this morning by herself to complete some of the baseline study activities for Upbeat. Greeted patient in lobby, thanked her for coming in and escorted her to the Nurse Tech area of the Breast Clinic.  VS, Ht & Wt: Obtained patient's height and weight without shoes and without heavy clothing per protocol.  Blood pressure and heart rate also obtained per protocol after allowing patient to sit/rest for 5 minutes and 1 minute rest between readings. BMI calculated using NIH BMI calculator website provided by study = 31.1 .  Concomitant Medication Review: Met with patient in private conference room. Reviewed her current medication list and updated with start and end dates.  Patient had her Mirena IUD removed yesterday. She has not started the Ativan, Zofran, EMLA or Compazine yet. These were prescribed to start with chemotherapy which is scheduled to start on 12/04/17. She is currently holding her Fish Oil x 5 days as instructed prior to [facility] placement, but will resume after procedure.  PROs: Patient was given study questionnaires to complete on her own.  Informed her that research assistant will come in about 20 to 30 minutes to administer the neurocognitive testing.  She verbalized understanding. Questionnaires were collected by Farris Has and checked for completeness.  Neurocognitive Testing: Neurocognitive testing was administered by research assistant, Farris Has, who also collected patient's completed questionnaires. Research nurse reviewed section F of the questionnaire booklet and her score for Depressive Symptom Screen was , which does not require follow up per protocol.  Physical Functions Testing: Patient then completed her physical functions testing which included 6-Minute Walk Test, Disability Measures, and SPPB with assistance of Merceda Elks, research nurse, without difficulty.  Cardiac MRI:  Scheduled for Tuesday 12/02/17 at  2 pm.  Reminded patient of appointment and to arrive 30 minutes early.  She verbalized understanding.   Research Labs: Reminded patient research labs will be drawn on her next lab appointment prior to starting treatment next Thursday 9/5.  Instructed patient to fast for at least 3 hours before the lab appointment. OK to drink water. She verbalized understanding.  Thanked patient for her time and participation today.  Informed patient she will receive a $25 gift card after she completes the MRI.  Informed patient that next study procedure is blood draw one month after she starts chemotherapy and we will try to arrange this same day as a regular lab appointment if possible.  Thanked patient again for her participation and encouraged her to call research nurse if any questions prior to next visit.  She verbalized understanding.  Foye Spurling, BSN, RN Clinical Research Nurse 11/28/2017 11:33 AM   Depression Score: Reviewed section F of baseline questionnaire for depression screening score after patient had left our clinic. She scored a 10 which requires [place] to ask patient if she would like a referral for depression. Called patient and informed her of her score which she states she is not surprised because she has been treated for depression at a clinic, but is not happy with her current doctor. She would really appreciate a referral from Dr. Lindi Adie to see another psychiatrist for her depression and to manage her anti depressive medication. Informed patient that research nurse will notify Dr. Lindi Adie and send request for referral.  Message sent to Dr. Lindi Adie and research nurse will follow up with patient next week.  Foye Spurling, BSN, RN Clinical Research Nurse 11/28/2017 1:51 PM

## 2017-11-30 DIAGNOSIS — C50919 Malignant neoplasm of unspecified site of unspecified female breast: Secondary | ICD-10-CM

## 2017-11-30 HISTORY — DX: Malignant neoplasm of unspecified site of unspecified female breast: C50.919

## 2017-12-02 ENCOUNTER — Other Ambulatory Visit: Payer: Self-pay

## 2017-12-02 ENCOUNTER — Ambulatory Visit (HOSPITAL_COMMUNITY)
Admission: RE | Admit: 2017-12-02 | Discharge: 2017-12-02 | Disposition: A | Discharge status: Home / Self Care | Payer: BLUE CROSS/BLUE SHIELD | Source: Ambulatory Visit | Attending: Hematology and Oncology | Admitting: Hematology and Oncology

## 2017-12-02 DIAGNOSIS — F339 Major depressive disorder, recurrent, unspecified: Secondary | ICD-10-CM

## 2017-12-02 DIAGNOSIS — C50919 Malignant neoplasm of unspecified site of unspecified female breast: Secondary | ICD-10-CM | POA: Insufficient documentation

## 2017-12-02 DIAGNOSIS — Z006 Encounter for examination for normal comparison and control in clinical research program: Secondary | ICD-10-CM | POA: Insufficient documentation

## 2017-12-03 ENCOUNTER — Other Ambulatory Visit: Payer: Self-pay

## 2017-12-03 ENCOUNTER — Encounter (HOSPITAL_BASED_OUTPATIENT_CLINIC_OR_DEPARTMENT_OTHER): Payer: Self-pay | Admitting: Anesthesiology

## 2017-12-03 ENCOUNTER — Telehealth: Payer: Self-pay | Admitting: *Deleted

## 2017-12-03 ENCOUNTER — Ambulatory Visit (HOSPITAL_COMMUNITY): Payer: BLUE CROSS/BLUE SHIELD

## 2017-12-03 ENCOUNTER — Ambulatory Visit (HOSPITAL_BASED_OUTPATIENT_CLINIC_OR_DEPARTMENT_OTHER): Payer: BLUE CROSS/BLUE SHIELD | Admitting: Anesthesiology

## 2017-12-03 ENCOUNTER — Encounter (HOSPITAL_BASED_OUTPATIENT_CLINIC_OR_DEPARTMENT_OTHER)
Admission: RE | Disposition: A | Discharge status: Home / Self Care | Payer: Self-pay | Source: Ambulatory Visit | Attending: Surgery

## 2017-12-03 ENCOUNTER — Ambulatory Visit (HOSPITAL_BASED_OUTPATIENT_CLINIC_OR_DEPARTMENT_OTHER)
Admission: RE | Admit: 2017-12-03 | Discharge: 2017-12-03 | Disposition: A | Discharge status: Home / Self Care | Payer: BLUE CROSS/BLUE SHIELD | Source: Ambulatory Visit | Attending: Surgery | Admitting: Surgery

## 2017-12-03 DIAGNOSIS — Z4682 Encounter for fitting and adjustment of non-vascular catheter: Secondary | ICD-10-CM | POA: Diagnosis not present

## 2017-12-03 DIAGNOSIS — C50411 Malignant neoplasm of upper-outer quadrant of right female breast: Secondary | ICD-10-CM | POA: Insufficient documentation

## 2017-12-03 DIAGNOSIS — E7800 Pure hypercholesterolemia, unspecified: Secondary | ICD-10-CM | POA: Insufficient documentation

## 2017-12-03 DIAGNOSIS — Z170 Estrogen receptor positive status [ER+]: Secondary | ICD-10-CM | POA: Diagnosis not present

## 2017-12-03 DIAGNOSIS — E559 Vitamin D deficiency, unspecified: Secondary | ICD-10-CM | POA: Diagnosis not present

## 2017-12-03 DIAGNOSIS — C50911 Malignant neoplasm of unspecified site of right female breast: Secondary | ICD-10-CM | POA: Diagnosis not present

## 2017-12-03 DIAGNOSIS — Z95828 Presence of other vascular implants and grafts: Secondary | ICD-10-CM

## 2017-12-03 DIAGNOSIS — Z79899 Other long term (current) drug therapy: Secondary | ICD-10-CM | POA: Diagnosis not present

## 2017-12-03 DIAGNOSIS — E7849 Other hyperlipidemia: Secondary | ICD-10-CM | POA: Diagnosis not present

## 2017-12-03 DIAGNOSIS — Z452 Encounter for adjustment and management of vascular access device: Secondary | ICD-10-CM | POA: Diagnosis not present

## 2017-12-03 HISTORY — PX: PORTACATH PLACEMENT: SHX2246

## 2017-12-03 LAB — POCT PREGNANCY, URINE: Preg Test, Ur: NEGATIVE

## 2017-12-03 SURGERY — INSERTION, TUNNELED CENTRAL VENOUS DEVICE, WITH PORT
Anesthesia: General | Site: Chest | Laterality: Right

## 2017-12-03 MED ORDER — ACETAMINOPHEN 500 MG PO TABS
1000.0000 mg | ORAL_TABLET | ORAL | Status: AC
  Administered 2017-12-03: 1000 mg via ORAL

## 2017-12-03 MED ORDER — ONDANSETRON HCL 4 MG/2ML IJ SOLN
INTRAMUSCULAR | Status: AC
  Filled 2017-12-03: qty 2

## 2017-12-03 MED ORDER — ACETAMINOPHEN 500 MG PO TABS
ORAL_TABLET | ORAL | Status: AC
  Filled 2017-12-03: qty 2

## 2017-12-03 MED ORDER — CHLORHEXIDINE GLUCONATE CLOTH 2 % EX PADS: 6.0000 | MEDICATED_PAD | Freq: Once | CUTANEOUS | Status: DC

## 2017-12-03 MED ORDER — OXYCODONE HCL 5 MG PO TABS: 5.0000 mg | ORAL_TABLET | Freq: Four times a day (QID) | ORAL | 0 refills | Status: DC | PRN

## 2017-12-03 MED ORDER — HEPARIN SOD (PORK) LOCK FLUSH 100 UNIT/ML IV SOLN
INTRAVENOUS | Status: DC | PRN
  Administered 2017-12-03: 500 [IU]

## 2017-12-03 MED ORDER — MIDAZOLAM HCL 2 MG/2ML IJ SOLN
1.0000 mg | INTRAMUSCULAR | Status: DC | PRN
  Administered 2017-12-03: 2 mg via INTRAVENOUS

## 2017-12-03 MED ORDER — LIDOCAINE HCL (CARDIAC) PF 100 MG/5ML IV SOSY
PREFILLED_SYRINGE | INTRAVENOUS | Status: DC | PRN
  Administered 2017-12-03: 80 mg via INTRAVENOUS

## 2017-12-03 MED ORDER — HEPARIN (PORCINE) IN NACL 2-0.9 UNITS/ML
INTRAMUSCULAR | Status: AC | PRN
  Administered 2017-12-03: 1

## 2017-12-03 MED ORDER — LACTATED RINGERS IV SOLN
INTRAVENOUS | Status: DC
  Administered 2017-12-03 (×2): via INTRAVENOUS

## 2017-12-03 MED ORDER — BUPIVACAINE-EPINEPHRINE 0.25% -1:200000 IJ SOLN
INTRAMUSCULAR | Status: DC | PRN
  Administered 2017-12-03: 10 mL

## 2017-12-03 MED ORDER — MIDAZOLAM HCL 2 MG/2ML IJ SOLN
INTRAMUSCULAR | Status: AC
  Filled 2017-12-03: qty 2

## 2017-12-03 MED ORDER — LIDOCAINE 2% (20 MG/ML) 5 ML SYRINGE
INTRAMUSCULAR | Status: AC
  Filled 2017-12-03: qty 5

## 2017-12-03 MED ORDER — GABAPENTIN 300 MG PO CAPS
ORAL_CAPSULE | ORAL | Status: AC
  Filled 2017-12-03: qty 1

## 2017-12-03 MED ORDER — FENTANYL CITRATE (PF) 100 MCG/2ML IJ SOLN
50.0000 ug | INTRAMUSCULAR | Status: DC | PRN
  Administered 2017-12-03: 100 ug via INTRAVENOUS

## 2017-12-03 MED ORDER — GABAPENTIN 300 MG PO CAPS
300.0000 mg | ORAL_CAPSULE | ORAL | Status: AC
  Administered 2017-12-03: 300 mg via ORAL

## 2017-12-03 MED ORDER — FENTANYL CITRATE (PF) 100 MCG/2ML IJ SOLN
INTRAMUSCULAR | Status: AC
  Filled 2017-12-03: qty 2

## 2017-12-03 MED ORDER — CELECOXIB 200 MG PO CAPS
200.0000 mg | ORAL_CAPSULE | ORAL | Status: AC
  Administered 2017-12-03: 200 mg via ORAL

## 2017-12-03 MED ORDER — IBUPROFEN 800 MG PO TABS: 800.0000 mg | ORAL_TABLET | Freq: Three times a day (TID) | ORAL | 0 refills | Status: DC | PRN

## 2017-12-03 MED ORDER — CEFAZOLIN SODIUM-DEXTROSE 2-4 GM/100ML-% IV SOLN
INTRAVENOUS | Status: AC
  Filled 2017-12-03: qty 100

## 2017-12-03 MED ORDER — DEXAMETHASONE SODIUM PHOSPHATE 4 MG/ML IJ SOLN
INTRAMUSCULAR | Status: DC | PRN
  Administered 2017-12-03: 10 mg via INTRAVENOUS

## 2017-12-03 MED ORDER — DEXAMETHASONE SODIUM PHOSPHATE 10 MG/ML IJ SOLN
INTRAMUSCULAR | Status: AC
  Filled 2017-12-03: qty 1

## 2017-12-03 MED ORDER — CELECOXIB 200 MG PO CAPS
ORAL_CAPSULE | ORAL | Status: AC
  Filled 2017-12-03: qty 1

## 2017-12-03 MED ORDER — PROPOFOL 10 MG/ML IV BOLUS
INTRAVENOUS | Status: DC | PRN
  Administered 2017-12-03: 200 mg via INTRAVENOUS

## 2017-12-03 MED ORDER — CEFAZOLIN SODIUM-DEXTROSE 2-4 GM/100ML-% IV SOLN
2.0000 g | INTRAVENOUS | Status: AC
  Administered 2017-12-03: 2 g via INTRAVENOUS

## 2017-12-03 MED ORDER — ONDANSETRON HCL 4 MG/2ML IJ SOLN
INTRAMUSCULAR | Status: DC | PRN
  Administered 2017-12-03: 4 mg via INTRAVENOUS

## 2017-12-03 MED ORDER — SCOPOLAMINE 1 MG/3DAYS TD PT72: 1.0000 | MEDICATED_PATCH | Freq: Once | TRANSDERMAL | Status: DC | PRN

## 2017-12-03 SURGICAL SUPPLY — 60 items
ADH SKN CLS APL DERMABOND .7 (GAUZE/BANDAGES/DRESSINGS) ×1
APL SKNCLS STERI-STRIP NONHPOA (GAUZE/BANDAGES/DRESSINGS)
BAG DECANTER FOR FLEXI CONT (MISCELLANEOUS) ×2 IMPLANT
BENZOIN TINCTURE PRP APPL 2/3 (GAUZE/BANDAGES/DRESSINGS) IMPLANT
BLADE HEX COATED 2.75 (ELECTRODE) ×2 IMPLANT
BLADE SURG 11 STRL SS (BLADE) ×2 IMPLANT
BLADE SURG 15 STRL LF DISP TIS (BLADE) ×1 IMPLANT
BLADE SURG 15 STRL SS (BLADE) ×2
CANISTER SUCT 1200ML W/VALVE (MISCELLANEOUS) IMPLANT
CHLORAPREP W/TINT 26ML (MISCELLANEOUS) ×2 IMPLANT
COVER BACK TABLE 60X90IN (DRAPES) ×2 IMPLANT
COVER MAYO STAND STRL (DRAPES) ×2 IMPLANT
COVER PROBE 5X48 (MISCELLANEOUS) ×2
DECANTER SPIKE VIAL GLASS SM (MISCELLANEOUS) IMPLANT
DERMABOND ADVANCED (GAUZE/BANDAGES/DRESSINGS) ×1
DERMABOND ADVANCED .7 DNX12 (GAUZE/BANDAGES/DRESSINGS) ×1 IMPLANT
DRAPE C-ARM 42X72 X-RAY (DRAPES) ×2 IMPLANT
DRAPE LAPAROSCOPIC ABDOMINAL (DRAPES) ×2 IMPLANT
DRAPE UTILITY XL STRL (DRAPES) ×2 IMPLANT
DRSG TEGADERM 2-3/8X2-3/4 SM (GAUZE/BANDAGES/DRESSINGS) ×2 IMPLANT
ELECT REM PT RETURN 9FT ADLT (ELECTROSURGICAL) ×2
ELECTRODE REM PT RTRN 9FT ADLT (ELECTROSURGICAL) ×1 IMPLANT
GAUZE SPONGE 4X4 12PLY STRL LF (GAUZE/BANDAGES/DRESSINGS) ×1 IMPLANT
GLOVE BIO SURGEON STRL SZ 6.5 (GLOVE) ×1 IMPLANT
GLOVE BIOGEL PI IND STRL 7.0 (GLOVE) IMPLANT
GLOVE BIOGEL PI IND STRL 8 (GLOVE) ×1 IMPLANT
GLOVE BIOGEL PI INDICATOR 7.0 (GLOVE) ×1
GLOVE BIOGEL PI INDICATOR 8 (GLOVE) ×1
GLOVE ECLIPSE 8.0 STRL XLNG CF (GLOVE) ×2 IMPLANT
GOWN STRL REUS W/ TWL LRG LVL3 (GOWN DISPOSABLE) ×2 IMPLANT
GOWN STRL REUS W/TWL LRG LVL3 (GOWN DISPOSABLE) ×4
IV KIT MINILOC 20X1 SAFETY (NEEDLE) IMPLANT
KIT CVR 48X5XPRB PLUP LF (MISCELLANEOUS) ×1 IMPLANT
KIT PORT POWER 8FR ISP CVUE (Port) ×1 IMPLANT
NDL HYPO 25X1 1.5 SAFETY (NEEDLE) ×1 IMPLANT
NDL SAFETY ECLIPSE 18X1.5 (NEEDLE) IMPLANT
NDL SPNL 22GX3.5 QUINCKE BK (NEEDLE) IMPLANT
NEEDLE HYPO 18GX1.5 SHARP (NEEDLE)
NEEDLE HYPO 22GX1.5 SAFETY (NEEDLE) IMPLANT
NEEDLE HYPO 25X1 1.5 SAFETY (NEEDLE) ×2 IMPLANT
NEEDLE SPNL 22GX3.5 QUINCKE BK (NEEDLE) IMPLANT
PACK BASIN DAY SURGERY FS (CUSTOM PROCEDURE TRAY) ×2 IMPLANT
PENCIL BUTTON HOLSTER BLD 10FT (ELECTRODE) ×2 IMPLANT
SET SHEATH INTRODUCER 10FR (MISCELLANEOUS) IMPLANT
SHEATH COOK PEEL AWAY SET 9F (SHEATH) IMPLANT
SLEEVE SCD COMPRESS KNEE MED (MISCELLANEOUS) ×1 IMPLANT
SPONGE LAP 4X18 RFD (DISPOSABLE) IMPLANT
STRIP CLOSURE SKIN 1/2X4 (GAUZE/BANDAGES/DRESSINGS) IMPLANT
SUT MON AB 4-0 PC3 18 (SUTURE) ×2 IMPLANT
SUT PROLENE 2 0 CT2 30 (SUTURE) IMPLANT
SUT PROLENE 2 0 SH DA (SUTURE) ×2 IMPLANT
SUT SILK 2 0 TIES 17X18 (SUTURE)
SUT SILK 2-0 18XBRD TIE BLK (SUTURE) IMPLANT
SUT VICRYL 3-0 CR8 SH (SUTURE) ×2 IMPLANT
SYR 5ML LUER SLIP (SYRINGE) ×2 IMPLANT
SYR CONTROL 10ML LL (SYRINGE) ×2 IMPLANT
TOWEL GREEN STERILE FF (TOWEL DISPOSABLE) ×4 IMPLANT
TOWEL OR NON WOVEN STRL DISP B (DISPOSABLE) ×2 IMPLANT
TUBE CONNECTING 20X1/4 (TUBING) IMPLANT
YANKAUER SUCT BULB TIP NO VENT (SUCTIONS) IMPLANT

## 2017-12-03 NOTE — Telephone Encounter (Signed)
Called patient to remind her to Fast for at least 3 hours prior to her lab appointment tomorrow morning for research labs.  She may drink water.  Patient verbalized understanding.  Foye Spurling, BSN, RN Clinical Research Nurse 12/03/2017 4:24 PM

## 2017-12-03 NOTE — Anesthesia Postprocedure Evaluation (Signed)
Anesthesia Post Note  Patient: Sandra [name]  Procedure(s) Performed: INSERTION PORT-A-CATH (Right Chest)     Patient location during evaluation: PACU Anesthesia Type: General Level of consciousness: awake and alert Pain management: pain level controlled Vital Signs Assessment: post-procedure vital signs reviewed and stable Respiratory status: spontaneous breathing, nonlabored ventilation, respiratory function stable and patient connected to nasal cannula oxygen Cardiovascular status: blood pressure returned to baseline and stable Postop Assessment: no apparent nausea or vomiting Anesthetic complications: no    Last Vitals:  Vitals:   12/03/17 1130 12/03/17 1219  BP:  126/78  Pulse: 80 74  Resp: 17 18  Temp:  36.8 C  SpO2: 100% 100%    Last Pain:  Vitals:   12/03/17 1219  TempSrc:   PainSc: 0-No pain                 Ryan P Ellender

## 2017-12-03 NOTE — Discharge Instructions (Signed)
Post Anesthesia Home Care Instructions  Activity: Get plenty of rest for the remainder of the day. A responsible individual must stay with you for 24 hours following the procedure.  For the next 24 hours, DO NOT: -Drive a car -Operate machinery -Drink alcoholic beverages -Take any medication unless instructed by your physician -Make any legal decisions or sign important papers.  Meals: Start with liquid foods such as gelatin or soup. Progress to regular foods as tolerated. Avoid greasy, spicy, heavy foods. If nausea and/or vomiting occur, drink only clear liquids until the nausea and/or vomiting subsides. Call your physician if vomiting continues.  Special Instructions/Symptoms: Your throat may feel dry or sore from the anesthesia or the breathing tube placed in your throat during surgery. If this causes discomfort, gargle with warm salt water. The discomfort should disappear within 24 hours.  If you had a scopolamine patch placed behind your ear for the management of post- operative nausea and/or vomiting:  1. The medication in the patch is effective for 72 hours, after which it should be removed.  Wrap patch in a tissue and discard in the trash. Wash hands thoroughly with soap and water. 2. You may remove the patch earlier than 72 hours if you experience unpleasant side effects which may include dry mouth, dizziness or visual disturbances. 3. Avoid touching the patch. Wash your hands with soap and water after contact with the patch.                 PORT-A-CATH: POST OP INSTRUCTIONS  Always review your discharge instruction sheet given to you by the facility where your surgery was performed.   1. A prescription for pain medication may be given to you upon discharge. Take your pain medication as prescribed, if needed. If narcotic pain medicine is not needed, then you make take acetaminophen (Tylenol) or ibuprofen (Advil) as needed.  2. Take your usually prescribed  medications unless otherwise directed. 3. If you need a refill on your pain medication, please contact our office. All narcotic pain medicine now requires a paper prescription.  Phoned in and fax refills are no longer allowed by law.  Prescriptions will not be filled after 5 pm or on weekends.  4. You should follow a light diet for the remainder of the day after your procedure. 5. Most patients will experience some mild swelling and/or bruising in the area of the incision. It may take several days to resolve. 6. It is common to experience some constipation if taking pain medication after surgery. Increasing fluid intake and taking a stool softener (such as Colace) will usually help or prevent this problem from occurring. A mild laxative (Milk of Magnesia or Miralax) should be taken according to package directions if there are no bowel movements after 48 hours.  7. Unless discharge instructions indicate otherwise, you may remove your bandages 48 hours after surgery, and you may shower at that time. You may have steri-strips (small white skin tapes) in place directly over the incision.  These strips should be left on the skin for 7-10 days.  If your surgeon used Dermabond (skin glue) on the incision, you may shower in 24 hours.  The glue will flake off over the next 2-3 weeks.  8. If your port is left accessed at the end of surgery (needle left in port), the dressing cannot get wet and should only by changed by a healthcare professional. When the port is no longer accessed (when the needle has been removed), follow step   7.   9. ACTIVITIES:  Limit activity involving your arms for the next 72 hours. Do no strenuous exercise or activity for 1 week. You may drive when you are no longer taking prescription pain medication, you can comfortably wear a seatbelt, and you can maneuver your car. 10.You may need to see your doctor in the office for a follow-up appointment.  Please       check with your doctor.  11.When  you receive a new Port-a-Cath, you will get a product guide and        ID card.  Please keep them in case you need them.  WHEN TO CALL YOUR DOCTOR ([phone]): 1. Fever over 101.0 2. Chills 3. Continued bleeding from incision 4. Increased redness and tenderness at the site 5. Shortness of breath, difficulty breathing   The clinic staff is available to answer your questions during regular business hours. Please don't hesitate to call and ask to speak to one of the nurses or medical assistants for clinical concerns. If you have a medical emergency, go to the nearest emergency room or call 911.  A surgeon from [facility] is always on call at the hospital.     For further information, please visit [email or web address]     

## 2017-12-03 NOTE — Progress Notes (Signed)
Per Dr.Gudena sent referral to [facility] for evaluation of depression. Faxed referral to [phone]. [place] [facility] office [phone].

## 2017-12-03 NOTE — Anesthesia Preprocedure Evaluation (Addendum)
Anesthesia Evaluation  Patient identified by MRN, date of birth, ID band Patient awake    Reviewed: Allergy & Precautions, NPO status , Patient's Chart, lab work & pertinent test results  Airway Mallampati: II  TM Distance: >3 FB Neck ROM: Full    Dental no notable dental hx.    Pulmonary neg pulmonary ROS,    Pulmonary exam normal breath sounds clear to auscultation       Cardiovascular negative cardio ROS Normal cardiovascular exam Rhythm:Regular Rate:Normal  ECHO: Left ventricle: The cavity size was normal. Systolic function was normal. The estimated ejection fraction was in the range of 55% to 60%. Wall motion was normal; there were no regional wall motion abnormalities. Left ventricular diastolic function parameters were normal.    Neuro/Psych  Headaches, PSYCHIATRIC DISORDERS Depression    GI/Hepatic negative GI ROS, Neg liver ROS,   Endo/Other  negative endocrine ROS  Renal/GU negative Renal ROS     Musculoskeletal negative musculoskeletal ROS (+)   Abdominal (+) + obese,   Peds  Hematology HLD   Anesthesia Other Findings breast cancer  Reproductive/Obstetrics                            Anesthesia Physical Anesthesia Plan  ASA: III  Anesthesia Plan: General   Post-op Pain Management:    Induction: Intravenous  PONV Risk Score and Plan: 3 and Ondansetron, Dexamethasone, Midazolam and Treatment may vary due to age or medical condition  Airway Management Planned: LMA  Additional Equipment:   Intra-op Plan:   Post-operative Plan: Extubation in OR  Informed Consent: I have reviewed the patients History and Physical, chart, labs and discussed the procedure including the risks, benefits and alternatives for the proposed anesthesia with the patient or authorized representative who has indicated his/her understanding and acceptance.   Dental advisory given  Plan Discussed  with: CRNA  Anesthesia Plan Comments:         Anesthesia Quick Evaluation

## 2017-12-03 NOTE — Op Note (Signed)
preoperative diagnosis: PAC needed/poor venous access  Postoperative diagnosis: Same  Procedure: Portacath Placement with U/S and C arm guidence  Surgeon: Turner Daniels, MD, FACS  Anesthesia: General and 0.25 % marcaine with epinephrine  Clinical History and Indications: The patient is getting ready to begin chemotherapy for her cancer. She  needs a Port-A-Cath for venous access. Risk of bleeding, infection,  Collapse lung,  Death,  DVT,  Organ injury,  Mediastinal injury,  Injury to heart,  Injury to blood vessels,  Nerves,  Migration of catheter,  Embolization of catheter and the need for more surgery.  Description of Procedure: I have seen the patient in the holding area and confirmed the plans for the procedure as noted above. I reviewed the risks and complications again and the patient has no further questions. She wishes to proceed.   The patient was then taken to the operating room. After satisfactory general  anesthesia had been obtained the upper chest and lower neck were prepped and draped as a sterile field. The timeout was done.  The right internal jugular vein  was entered under U/S guidance  and the guidewire threaded into the superior vena cava right atrial area under fluoroscopic guidance. An incision was then made on the anterior chest wall and a subcutaneous pocket fashioned for the port reservoir.  The port tubing was then brought through a subcutaneous tunnel from the port site to the guidewire site.  The port and catheter were attached, locked  and flushed. The catheter was measured and cut to appropriate length.The dilator and peel-away sheath were then advanced over the guidewire while monitoring this with fluoroscopy. The guidewire and dilator were removed and the tubing threaded to approximately 21 cm. The peel-away sheath was then removed. The catheter aspirated and flushed easily. Using fluoroscopy the tip was in the superior vena cava right atrial junction area. It  aspirated and flushed easily. That aspirated and flushed easily.  The reservoir was secured to the fascia with 1 sutures of 2-0 Prolene. A final check with fluoroscopy was done to make sure we had no kinks and good positioning of the tip of the catheter. Everything appeared to be okay. The catheter was aspirated, flushed with dilute heparin and then concentrated aqueous heparin.Port left accessed for chemotherapy tomorrow.   The incision was then closed with interrupted 3-0 Vicryl, and 4-0 Monocryl subcuticular with Dermabond on the skin.  There were no operative complications. Estimated blood loss was minimal. All counts were correct. The patient tolerated the procedure well.  Turner Daniels, MD, FACS

## 2017-12-03 NOTE — Interval H&P Note (Signed)
History and Physical Interval Note:  12/03/2017 9:24 AM  Sandra [name]  has presented today for surgery, with the diagnosis of breast cancer  The various methods of treatment have been discussed with the patient and family. After consideration of risks, benefits and other options for treatment, the patient has consented to  Procedure(s) with comments: INSERTION PORT-A-CATH (Right) - needs ultrasound as a surgical intervention .  The patient's history has been reviewed, patient examined, no change in status, stable for surgery.  I have reviewed the patient's chart and labs.  Questions were answered to the patient's satisfaction.     [place]

## 2017-12-03 NOTE — Transfer of Care (Signed)
Immediate Anesthesia Transfer of Care Note  Patient: Sandra [name]  Procedure(s) Performed: INSERTION PORT-A-CATH (Right Chest)  Patient Location: PACU  Anesthesia Type:General  Level of Consciousness: awake, sedated and patient cooperative  Airway & Oxygen Therapy: Patient Spontanous Breathing  Post-op Assessment: Report given to RN and Post -op Vital signs reviewed and stable  Post vital signs: Reviewed and stable  Last Vitals:  Vitals Value Taken Time  BP 112/64 12/03/2017 10:36 AM  Temp    Pulse 84 12/03/2017 10:37 AM  Resp 12 12/03/2017 10:37 AM  SpO2 96 % 12/03/2017 10:37 AM  Vitals shown include unvalidated device data.  Last Pain:  Vitals:   12/03/17 0848  TempSrc: Oral  PainSc: 0-No pain         Complications: No apparent anesthesia complications

## 2017-12-03 NOTE — Anesthesia Procedure Notes (Signed)
Procedure Name: LMA Insertion Date/Time: 12/03/2017 9:38 AM Performed by: Lyndee Leo, CRNA Pre-anesthesia Checklist: Patient identified, Emergency Drugs available, Suction available and Patient being monitored Patient Re-evaluated:Patient Re-evaluated prior to induction Oxygen Delivery Method: Circle system utilized Preoxygenation: Pre-oxygenation with 100% oxygen Induction Type: IV induction Ventilation: Mask ventilation without difficulty LMA: LMA inserted LMA Size: 3.0 Number of attempts: 1 Airway Equipment and Method: Bite block Placement Confirmation: positive ETCO2 Tube secured with: Tape Dental Injury: Teeth and Oropharynx as per pre-operative assessment

## 2017-12-03 NOTE — Progress Notes (Signed)
Faxed referral to [facility] psychiatry office for evaluation of depression per Dr.Gudena.   [facility]  [phone] Fax: [phone]

## 2017-12-04 ENCOUNTER — Other Ambulatory Visit: Payer: Self-pay | Admitting: *Deleted

## 2017-12-04 ENCOUNTER — Inpatient Hospital Stay: Payer: BLUE CROSS/BLUE SHIELD | Attending: Hematology and Oncology

## 2017-12-04 ENCOUNTER — Other Ambulatory Visit: Payer: Self-pay | Admitting: Hematology and Oncology

## 2017-12-04 ENCOUNTER — Encounter (HOSPITAL_BASED_OUTPATIENT_CLINIC_OR_DEPARTMENT_OTHER): Payer: Self-pay | Admitting: Surgery

## 2017-12-04 ENCOUNTER — Encounter: Payer: Self-pay | Admitting: *Deleted

## 2017-12-04 ENCOUNTER — Inpatient Hospital Stay: Payer: BLUE CROSS/BLUE SHIELD | Admitting: Hematology and Oncology

## 2017-12-04 ENCOUNTER — Inpatient Hospital Stay: Payer: BLUE CROSS/BLUE SHIELD

## 2017-12-04 ENCOUNTER — Telehealth: Payer: Self-pay | Admitting: *Deleted

## 2017-12-04 DIAGNOSIS — Z5111 Encounter for antineoplastic chemotherapy: Secondary | ICD-10-CM | POA: Insufficient documentation

## 2017-12-04 DIAGNOSIS — Z5189 Encounter for other specified aftercare: Secondary | ICD-10-CM | POA: Diagnosis not present

## 2017-12-04 DIAGNOSIS — Z006 Encounter for examination for normal comparison and control in clinical research program: Secondary | ICD-10-CM

## 2017-12-04 DIAGNOSIS — Z170 Estrogen receptor positive status [ER+]: Secondary | ICD-10-CM

## 2017-12-04 DIAGNOSIS — Z79899 Other long term (current) drug therapy: Secondary | ICD-10-CM | POA: Diagnosis not present

## 2017-12-04 DIAGNOSIS — Z5112 Encounter for antineoplastic immunotherapy: Secondary | ICD-10-CM | POA: Diagnosis not present

## 2017-12-04 DIAGNOSIS — Z95828 Presence of other vascular implants and grafts: Secondary | ICD-10-CM

## 2017-12-04 DIAGNOSIS — C50411 Malignant neoplasm of upper-outer quadrant of right female breast: Secondary | ICD-10-CM | POA: Diagnosis not present

## 2017-12-04 DIAGNOSIS — R530 Neoplastic (malignant) related fatigue: Secondary | ICD-10-CM | POA: Diagnosis not present

## 2017-12-04 DIAGNOSIS — M898X9 Other specified disorders of bone, unspecified site: Secondary | ICD-10-CM | POA: Diagnosis not present

## 2017-12-04 DIAGNOSIS — R197 Diarrhea, unspecified: Secondary | ICD-10-CM | POA: Diagnosis not present

## 2017-12-04 LAB — CMP (CANCER CENTER ONLY)
ALT: 22 U/L (ref 0–44)
AST: 16 U/L (ref 15–41)
Albumin: 4 g/dL (ref 3.5–5.0)
Alkaline Phosphatase: 55 U/L (ref 38–126)
Anion gap: 9 (ref 5–15)
BUN: 9 mg/dL (ref 6–20)
CO2: 24 mmol/L (ref 22–32)
Calcium: 9.1 mg/dL (ref 8.9–10.3)
Chloride: 107 mmol/L (ref 98–111)
Creatinine: 0.81 mg/dL (ref 0.44–1.00)
GFR, Est AFR Am: >60 mL/min (ref >60)
GFR, Estimated: >60 mL/min (ref >60)
Glucose, Bld: 89 mg/dL (ref 70–99)
Potassium: 3.7 mmol/L (ref 3.5–5.1)
Sodium: 140 mmol/L (ref 135–145)
Total Bilirubin: 0.3 mg/dL (ref 0.3–1.2)
Total Protein: 7.1 g/dL (ref 6.5–8.1)

## 2017-12-04 LAB — CBC WITH DIFFERENTIAL (CANCER CENTER ONLY)
Basophils Absolute: 0 10*3/uL (ref 0.0–0.1)
Basophils Relative: 0 %
Eosinophils Absolute: 0 10*3/uL (ref 0.0–0.5)
Eosinophils Relative: 0 %
HCT: 37.4 % (ref 34.8–46.6)
Hemoglobin: 12.8 g/dL (ref 11.6–15.9)
Lymphocytes Relative: 13 %
Lymphs Abs: 1.6 10*3/uL (ref 0.9–3.3)
MCH: 30.5 pg (ref 25.1–34.0)
MCHC: 34.2 g/dL (ref 31.5–36.0)
MCV: 89 fL (ref 79.5–101.0)
Monocytes Absolute: 0.8 10*3/uL (ref 0.1–0.9)
Monocytes Relative: 7 %
Neutro Abs: 9.8 10*3/uL — ABNORMAL HIGH (ref 1.5–6.5)
Neutrophils Relative %: 80 %
Platelet Count: 229 10*3/uL (ref 145–400)
RBC: 4.2 MIL/uL (ref 3.70–5.45)
RDW: 13.4 % (ref 11.2–14.5)
WBC Count: 12.2 10*3/uL — ABNORMAL HIGH (ref 3.9–10.3)

## 2017-12-04 LAB — RESEARCH LABS

## 2017-12-04 MED ORDER — DIPHENHYDRAMINE HCL 25 MG PO CAPS
ORAL_CAPSULE | ORAL | Status: AC
  Filled 2017-12-04: qty 1

## 2017-12-04 MED ORDER — DIPHENHYDRAMINE HCL 50 MG/ML IJ SOLN
INTRAMUSCULAR | Status: AC
  Filled 2017-12-04: qty 1

## 2017-12-04 MED ORDER — TRASTUZUMAB CHEMO 150 MG IV SOLR
4.0000 mg/kg | Freq: Once | INTRAVENOUS | Status: AC
  Administered 2017-12-04: 399 mg via INTRAVENOUS
  Filled 2017-12-04: qty 19

## 2017-12-04 MED ORDER — SODIUM CHLORIDE 0.9% FLUSH
10.0000 mL | INTRAVENOUS | Status: DC | PRN
  Administered 2017-12-04: 10 mL via INTRAVENOUS
  Filled 2017-12-04: qty 10

## 2017-12-04 MED ORDER — HEPARIN SOD (PORK) LOCK FLUSH 100 UNIT/ML IV SOLN
500.0000 [IU] | Freq: Once | INTRAVENOUS | Status: AC | PRN
  Administered 2017-12-04: 500 [IU]
  Filled 2017-12-04: qty 5

## 2017-12-04 MED ORDER — SODIUM CHLORIDE 0.9 % IV SOLN
Freq: Once | INTRAVENOUS | Status: AC
  Administered 2017-12-04: 12:00:00 via INTRAVENOUS
  Filled 2017-12-04: qty 250

## 2017-12-04 MED ORDER — SODIUM CHLORIDE 0.9% FLUSH
10.0000 mL | INTRAVENOUS | Status: DC | PRN
  Administered 2017-12-04: 10 mL
  Filled 2017-12-04: qty 10

## 2017-12-04 MED ORDER — DIPHENHYDRAMINE HCL 50 MG/ML IJ SOLN
25.0000 mg | Freq: Once | INTRAMUSCULAR | Status: AC
  Administered 2017-12-04: 25 mg via INTRAVENOUS

## 2017-12-04 NOTE — Telephone Encounter (Signed)
Scheduled and confirmed appt for US/bx on 9/10 at 8:10.

## 2017-12-04 NOTE — Progress Notes (Signed)
DISCONTINUE OFF PATHWAY REGIMEN - Breast   [id number]:Paclitaxel + Trastuzumab:   A cycle is every 28 days:     Paclitaxel      Trastuzumab-xxxx      Trastuzumab-xxxx   **Always confirm dose/schedule in your pharmacy ordering system**  REASON: Other Reason PRIOR TREATMENT: Off Pathway: Paclitaxel + Trastuzumab TREATMENT RESPONSE: Unable to Evaluate  START ON PATHWAY REGIMEN - Breast     A cycle is every 21 days:     Pertuzumab      Pertuzumab      Trastuzumab-xxxx      Trastuzumab-xxxx      Carboplatin      Docetaxel   **Always confirm dose/schedule in your pharmacy ordering system**  Patient Characteristics: Preoperative or Nonsurgical Candidate (Clinical Staging), Neoadjuvant Therapy followed by Surgery, Invasive Disease, Chemotherapy, HER2 Positive, ER Positive Therapeutic Status: Preoperative or Nonsurgical Candidate (Clinical Staging) AJCC M Category: cM0 AJCC Grade: G3 Breast Surgical Plan: Neoadjuvant Therapy followed by Surgery ER Status: Positive (+) AJCC 8 Stage Grouping: IB HER2 Status: Positive (+) AJCC T Category: cT2 AJCC N Category: cN0 PR Status: Positive (+) Intent of Therapy: Curative Intent, Discussed with Patient

## 2017-12-04 NOTE — Progress Notes (Signed)
Spoke with patient in lobby and gave her $11 [employer] gift card for completing baseline study activities.  Thanked patient very much for her participation.  Informed her of study labs to be drawn again at 1 month.  Research nurse will coordinate the research blood draw with a regularly scheduled lab appointment and notify patient ahead of time. She verbalized understanding.  Foye Spurling, BSN, RN Clinical Research Nurse 12/04/2017 9:32 AM

## 2017-12-04 NOTE — Progress Notes (Signed)
So the recent MR [facility] seven-point was happening is her MRI showed much worse disease so she was supposed with weekly Taxol Herceptin neoadjuvant today so she is getting Herceptin only today and then next week she is to change the orders I can put it in until today's treatment is done with no new or  Patient Care Team: Nunzio Cobbs, MD as PCP - General (Obstetrics and Gynecology) Macario Carls, MD as Referring Physician (Specialist) Erroll Luna, MD as Consulting Physician (General Surgery) Nicholas Lose, MD as Consulting Physician (Hematology and Oncology) Kyung Rudd, MD as Consulting Physician (Radiation Oncology)  DIAGNOSIS:  Encounter Diagnosis  Name Primary?  . Malignant neoplasm of upper-outer quadrant of right breast in female, estrogen receptor positive ([place])     SUMMARY OF ONCOLOGIC HISTORY:   Malignant neoplasm of upper-outer quadrant of right breast in female, estrogen receptor positive ([place])   11/11/2017 Initial Diagnosis    Palpable right breast mass at 10 o'clock position 1.7 cm, axilla negative, biopsy revealed grade 3 IDC ER 60%, PR 10%, Ki-67 70%, HER-2 positive, T1 CN 0 stage Ia    11/19/2017 Cancer Staging    Staging form: Breast, AJCC 8th Edition - Clinical: Stage IB (cT2, cN0, cM0, G3, ER+, PR+, HER2+) - Signed by Nicholas Lose, MD on 12/04/2017    11/25/2017 Breast MRI    4.4 x 2.8 x 2.1 cm right breast malignancy UOQ, second focus 0.8 cm (biopsy planned for 12/09/2017)    12/04/2017 -  Neo-Adjuvant Chemotherapy    Initial plan was with Taxol Herceptin but it was switched to [facility] Perjeta every 3 week x6 followed by Herceptin plus/minus Perjeta maintenance when the breast MRI showed more extensive disease.     CHIEF COMPLIANT: Follow-up to discuss a treatment plan based upon the MRI results  INTERVAL HISTORY: Sandra [name] is a 52 year old with above-mentioned history of right breast cancer who is here to start her neoadjuvant treatment with Taxol  Herceptin.  In the interim she had a breast MRI which showed more extensive disease.  So we discussed switching her treatment from Taxol Herceptin to [place].  REVIEW OF SYSTEMS:   Constitutional: Denies fevers, chills or abnormal weight loss Eyes: Denies blurriness of vision Ears, nose, mouth, throat, and face: Denies mucositis or sore throat Respiratory: Denies cough, dyspnea or wheezes Cardiovascular: Denies palpitation, chest discomfort Gastrointestinal:  Denies nausea, heartburn or change in bowel habits Skin: Denies abnormal skin rashes Lymphatics: Denies new lymphadenopathy or easy bruising Neurological:Denies numbness, tingling or new weaknesses Behavioral/Psych: Mood is stable, no new changes  Extremities: No lower extremity edema   All other systems were reviewed with the patient and are negative.  I have reviewed the past medical history, past surgical history, social history and family history with the patient and they are unchanged from previous note.  ALLERGIES:  has No Known Allergies.  MEDICATIONS:  Current Outpatient Medications  Medication Sig Dispense Refill  . betamethasone valerate ointment (VALISONE) 0.1 % Apply bid for 1-2 weeks as needed for flare of lichen sclerosis. 15 g 1  . ibuprofen (ADVIL,MOTRIN) 800 MG tablet Take 1 tablet (800 mg total) by mouth every 8 (eight) hours as needed. 30 tablet 0  . lidocaine-prilocaine (EMLA) cream Apply 1 application topically as needed. (Patient not taking: Reported on 11/28/2017) 30 g 6  . LORazepam (ATIVAN) 0.5 MG tablet Take 1 tablet (0.5 mg total) by mouth at bedtime as needed for sleep. (Patient not taking: Reported on 11/28/2017)  30 tablet 0  . Omega-3 Fatty Acids (FISH OIL) 1000 MG CAPS Take 1 capsule by mouth daily.    . ondansetron (ZOFRAN) 8 MG tablet Take 1 tablet (8 mg total) by mouth 2 (two) times daily as needed (Nausea or vomiting). (Patient not taking: Reported on 11/28/2017) 30 tablet 1  . oxyCODONE (OXY  IR/ROXICODONE) 5 MG immediate release tablet Take 1 tablet (5 mg total) by mouth every 6 (six) hours as needed for severe pain. 15 tablet 0  . prochlorperazine (COMPAZINE) 10 MG tablet Take 1 tablet (10 mg total) by mouth every 6 (six) hours as needed (Nausea or vomiting). (Patient not taking: Reported on 11/28/2017) 30 tablet 1  . venlafaxine XR (EFFEXOR-XR) 75 MG 24 hr capsule Take 1 capsule (75 mg total) by mouth daily. 30 capsule 0   No current facility-administered medications for this visit.     PHYSICAL EXAMINATION: ECOG PERFORMANCE STATUS: 1 - Symptomatic but completely ambulatory  Vitals:   12/04/17 0954  BP: 130/80  Pulse: 76  Resp: 19  Temp: 98 F (36.7 C)  SpO2: 99%   Filed Weights   12/04/17 0954  Weight: 221 lb 3.2 oz (100.3 kg)    GENERAL:alert, no distress and comfortable SKIN: skin color, texture, turgor are normal, no rashes or significant lesions EYES: normal, Conjunctiva are pink and non-injected, sclera clear OROPHARYNX:no exudate, no erythema and lips, buccal mucosa, and tongue normal  NECK: supple, thyroid normal size, non-tender, without nodularity LYMPH:  no palpable lymphadenopathy in the cervical, axillary or inguinal LUNGS: clear to auscultation and percussion with normal breathing effort HEART: regular rate & rhythm and no murmurs and no lower extremity edema ABDOMEN:abdomen soft, non-tender and normal bowel sounds MUSCULOSKELETAL:no cyanosis of digits and no clubbing  NEURO: alert & oriented x 3 with fluent speech, no focal motor/sensory deficits EXTREMITIES: No lower extremity edema   LABORATORY DATA:  I have reviewed the data as listed CMP Latest Ref Rng & Units 12/04/2017 11/19/2017 04/21/2017  Glucose 70 - 99 mg/dL 89 89 88  BUN 6 - 20 mg/dL _0 Creatinine 0.44 - 1.00 mg/dL 0.81 0.84 0.88  Sodium 135 - 145 mmol/L 140 139 137  Potassium 3.5 - 5.1 mmol/L 3.7 3.9 4.5  Chloride 98 - 111 mmol/L 107 104 102  CO2 22 - 32 mmol/L _1 Calcium 8.9 - 10.3 mg/dL 9.1 9.0 9.5  Total Protein 6.5 - 8.1 g/dL 7.1 7.3 7.1  Total Bilirubin 0.3 - 1.2 mg/dL 0.3 0.4 0.4  Alkaline Phos 38 - 126 U/L 55 60 46  AST 15 - 41 U/L 16 11(L) 15  ALT 0 - 44 U/L _2 Lab Results  Component Value Date   WBC 12.2 (H) 12/04/2017   HGB 12.8 12/04/2017   HCT 37.4 12/04/2017   MCV 89.0 12/04/2017   PLT 229 12/04/2017   NEUTROABS 9.8 (H) 12/04/2017    ASSESSMENT & PLAN:  Malignant neoplasm of upper-outer quadrant of right breast in female, estrogen receptor positive ([facility]) 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%, PR 10%, Ki-67 70%, HER-2 positive, T1 CN 0 stage Ia Breast MRI 11/25/2017: 4.4 x 2.8 x 2.1 cm right breast malignancy UOQ, second focus 0.8 cm (biopsy planned for 12/09/2017), overall extent of disease is 7 cm  Treatment plan: 1.  Neoadjuvant chemotherapy with Taxol Herceptin followed by Herceptin maintenance for 1 year  2. breast conserving surgery with sentinel lymph node biopsy 3.  Radiation therapy 4.  Adjuvant antiestrogen therapy -------------------------------------------------------------------------------------------------------------------------------- Radiology review: I discussed with the patient that the breast MRI shows a larger extent of disease with non-mass enhancement.   I recommended switching her treatment to [place]. We can get it authorized and started by next week. Today she will get Herceptin alone.  Current treatment:  Herceptin today.  Next week she will start [facility] Perjeta I discussed the risks and benefits of [place].  She understands these risks and is consented to proceed. I called Dr. Brantley Stage to discuss the breast MRI results with him.  We will check with him to see if she needs additional biopsy of the satellite nodule.  No orders of the defined types were placed in this encounter.  The patient has a good  understanding of the overall plan. she agrees with it. she will call with any problems that may develop before the next visit here.   Harriette Ohara, MD 12/04/17

## 2017-12-04 NOTE — Patient Instructions (Addendum)
[facility] Discharge Instructions for Patients Receiving Chemotherapy  Today you received the following chemotherapy agents Herceptin and TAxol  To help prevent nausea and vomiting after your treatment, we encourage you to take your nausea medication as prescribed.  If you develop nausea and vomiting that is not controlled by your nausea medication, call the clinic.   BELOW ARE SYMPTOMS THAT SHOULD BE REPORTED IMMEDIATELY:  *FEVER GREATER THAN 100.5 F  *CHILLS WITH OR WITHOUT FEVER  NAUSEA AND VOMITING THAT IS NOT CONTROLLED WITH YOUR NAUSEA MEDICATION  *UNUSUAL SHORTNESS OF BREATH  *UNUSUAL BRUISING OR BLEEDING  TENDERNESS IN MOUTH AND THROAT WITH OR WITHOUT PRESENCE OF ULCERS  *URINARY PROBLEMS  *BOWEL PROBLEMS  UNUSUAL RASH Items with * indicate a potential emergency and should be followed up as soon as possible.  Feel free to call the clinic should you have any questions or concerns. The clinic phone number is [phone].  Please show the [place] at check-in to the Emergency Department and triage nurse.  Trastuzumab injection for infusion(Herceptin) What is this medicine? TRASTUZUMAB (tras TOO zoo mab) is a monoclonal antibody. It is used to treat breast cancer and stomach cancer. This medicine may be used for other purposes; ask your health care provider or pharmacist if you have questions. COMMON BRAND NAME(S): Herceptin What should I tell my health care provider before I take this medicine? They need to know if you have any of these conditions: -heart disease -heart failure -lung or breathing disease, like asthma -an unusual or allergic reaction to trastuzumab, benzyl alcohol, or other medications, foods, dyes, or preservatives -pregnant or trying to get pregnant -breast-feeding How should I use this medicine? This drug is given as an infusion into a vein. It is administered in a hospital or clinic by a specially trained health care  professional. Talk to your pediatrician regarding the use of this medicine in children. This medicine is not approved for use in children. Overdosage: If you think you have taken too much of this medicine contact a poison control center or emergency room at once. NOTE: This medicine is only for you. Do not share this medicine with others. What if I miss a dose? It is important not to miss a dose. Call your doctor or health care professional if you are unable to keep an appointment. What may interact with this medicine? This medicine may interact with the following medications: -certain types of chemotherapy, such as daunorubicin, doxorubicin, epirubicin, and idarubicin This list may not describe all possible interactions. Give your health care provider a list of all the medicines, herbs, non-prescription drugs, or dietary supplements you use. Also tell them if you smoke, drink alcohol, or use illegal drugs. Some items may interact with your medicine. What should I watch for while using this medicine? Visit your doctor for checks on your progress. Report any side effects. Continue your course of treatment even though you feel ill unless your doctor tells you to stop. Call your doctor or health care professional for advice if you get a fever, chills or sore throat, or other symptoms of a cold or flu. Do not treat yourself. Try to avoid being around people who are sick. You may experience fever, chills and shaking during your first infusion. These effects are usually mild and can be treated with other medicines. Report any side effects during the infusion to your health care professional. Fever and chills usually do not happen with later infusions. Do not become pregnant  while taking this medicine or for 7 months after stopping it. Women should inform their doctor if they wish to become pregnant or think they might be pregnant. Women of child-bearing potential will need to have a negative pregnancy test  before starting this medicine. There is a potential for serious side effects to an unborn child. Talk to your health care professional or pharmacist for more information. Do not breast-feed an infant while taking this medicine or for 7 months after stopping it. Women must use effective birth control with this medicine. What side effects may I notice from receiving this medicine? Side effects that you should report to your doctor or health care professional as soon as possible: -allergic reactions like skin rash, itching or hives, swelling of the face, lips, or tongue -chest pain or palpitations -cough -dizziness -feeling faint or lightheaded, falls -fever -general ill feeling or flu-like symptoms -signs of worsening heart failure like breathing problems; swelling in your legs and feet -unusually weak or tired Side effects that usually do not require medical attention (report to your doctor or health care professional if they continue or are bothersome): -bone pain -changes in taste -diarrhea -joint pain -nausea/vomiting -weight loss This list may not describe all possible side effects. Call your doctor for medical advice about side effects. You may report side effects to FDA at 1-800-FDA-1088. Where should I keep my medicine? This drug is given in a hospital or clinic and will not be stored at home. NOTE: This sheet is a summary. It may not cover all possible information. If you have questions about this medicine, talk to your doctor, pharmacist, or health care provider.  2018 Elsevier/Gold Standard (2016-03-12 14:37:52)

## 2017-12-04 NOTE — Assessment & Plan Note (Addendum)
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%, PR 10%, Ki-67 70%, HER-2 positive, T1 CN 0 stage Ia Breast MRI 11/25/2017: 4.4 x 2.8 x 2.1 cm right breast malignancy UOQ, second focus 0.8 cm (biopsy planned for 12/09/2017), overall extent of disease is 7 cm  Treatment plan: 1.  Neoadjuvant chemotherapy with Taxol Herceptin followed by Herceptin maintenance for 1 year 2. breast conserving surgery with sentinel lymph node biopsy 3.  Radiation therapy 4.  Adjuvant antiestrogen therapy -------------------------------------------------------------------------------------------------------------------------------- Radiology review: I discussed with the patient that the breast MRI shows a larger extent of disease with non-mass enhancement.  I would like to get a second biopsy for extent of disease to determine if the chemotherapy needs to be changed. If the rest of it is also HER-2 positive breast cancer then we will have to change her treatment to [place].  Current treatment: Cycle 1 day 1 Taxol Herceptin

## 2017-12-04 NOTE — Progress Notes (Signed)
Just diphenhydramine as premed for herceptin today per Dr. Lindi Adie.   Demetrius Charity, PharmD Oncology Pharmacist Pharmacy Phone: [phone] 12/04/2017

## 2017-12-09 ENCOUNTER — Other Ambulatory Visit: Payer: Self-pay

## 2017-12-09 ENCOUNTER — Ambulatory Visit
Admission: RE | Admit: 2017-12-09 | Discharge: 2017-12-09 | Disposition: A | Discharge status: Home / Self Care | Payer: BLUE CROSS/BLUE SHIELD | Source: Ambulatory Visit | Attending: Surgery | Admitting: Surgery

## 2017-12-09 DIAGNOSIS — N6310 Unspecified lump in the right breast, unspecified quadrant: Secondary | ICD-10-CM

## 2017-12-09 DIAGNOSIS — N6489 Other specified disorders of breast: Secondary | ICD-10-CM | POA: Diagnosis not present

## 2017-12-09 DIAGNOSIS — D0511 Intraductal carcinoma in situ of right breast: Secondary | ICD-10-CM | POA: Diagnosis not present

## 2017-12-10 ENCOUNTER — Inpatient Hospital Stay: Payer: BLUE CROSS/BLUE SHIELD | Admitting: Hematology and Oncology

## 2017-12-10 ENCOUNTER — Inpatient Hospital Stay: Payer: BLUE CROSS/BLUE SHIELD

## 2017-12-10 ENCOUNTER — Ambulatory Visit: Payer: Self-pay

## 2017-12-10 DIAGNOSIS — R197 Diarrhea, unspecified: Secondary | ICD-10-CM | POA: Diagnosis not present

## 2017-12-10 DIAGNOSIS — Z170 Estrogen receptor positive status [ER+]: Secondary | ICD-10-CM | POA: Diagnosis not present

## 2017-12-10 DIAGNOSIS — Z5112 Encounter for antineoplastic immunotherapy: Secondary | ICD-10-CM | POA: Diagnosis not present

## 2017-12-10 DIAGNOSIS — C50411 Malignant neoplasm of upper-outer quadrant of right female breast: Secondary | ICD-10-CM

## 2017-12-10 DIAGNOSIS — M898X9 Other specified disorders of bone, unspecified site: Secondary | ICD-10-CM | POA: Diagnosis not present

## 2017-12-10 DIAGNOSIS — Z5189 Encounter for other specified aftercare: Secondary | ICD-10-CM | POA: Diagnosis not present

## 2017-12-10 DIAGNOSIS — R530 Neoplastic (malignant) related fatigue: Secondary | ICD-10-CM | POA: Diagnosis not present

## 2017-12-10 DIAGNOSIS — Z5111 Encounter for antineoplastic chemotherapy: Secondary | ICD-10-CM | POA: Diagnosis not present

## 2017-12-10 DIAGNOSIS — Z79899 Other long term (current) drug therapy: Secondary | ICD-10-CM | POA: Diagnosis not present

## 2017-12-10 DIAGNOSIS — F4323 Adjustment disorder with mixed anxiety and depressed mood: Secondary | ICD-10-CM | POA: Diagnosis not present

## 2017-12-10 DIAGNOSIS — Z95828 Presence of other vascular implants and grafts: Secondary | ICD-10-CM | POA: Insufficient documentation

## 2017-12-10 LAB — CMP (CANCER CENTER ONLY)
ALT: 16 U/L (ref 0–44)
AST: 15 U/L (ref 15–41)
Albumin: 3.7 g/dL (ref 3.5–5.0)
Alkaline Phosphatase: 55 U/L (ref 38–126)
Anion gap: 8 (ref 5–15)
BUN: 10 mg/dL (ref 6–20)
CO2: 24 mmol/L (ref 22–32)
Calcium: 8.8 mg/dL — ABNORMAL LOW (ref 8.9–10.3)
Chloride: 106 mmol/L (ref 98–111)
Creatinine: 0.9 mg/dL (ref 0.44–1.00)
GFR, Est AFR Am: >60 mL/min (ref >60)
GFR, Estimated: >60 mL/min (ref >60)
Glucose, Bld: 121 mg/dL — ABNORMAL HIGH (ref 70–99)
Potassium: 3.6 mmol/L (ref 3.5–5.1)
Sodium: 138 mmol/L (ref 135–145)
Total Bilirubin: 0.3 mg/dL (ref 0.3–1.2)
Total Protein: 6.6 g/dL (ref 6.5–8.1)

## 2017-12-10 LAB — CBC WITH DIFFERENTIAL (CANCER CENTER ONLY)
Basophils Absolute: 0 10*3/uL (ref 0.0–0.1)
Basophils Relative: 1 %
Eosinophils Absolute: 0.2 10*3/uL (ref 0.0–0.5)
Eosinophils Relative: 4 %
HCT: 35.4 % (ref 34.8–46.6)
Hemoglobin: 12 g/dL (ref 11.6–15.9)
Lymphocytes Relative: 31 %
Lymphs Abs: 1.4 10*3/uL (ref 0.9–3.3)
MCH: 30.5 pg (ref 25.1–34.0)
MCHC: 34 g/dL (ref 31.5–36.0)
MCV: 89.8 fL (ref 79.5–101.0)
Monocytes Absolute: 0.4 10*3/uL (ref 0.1–0.9)
Monocytes Relative: 9 %
Neutro Abs: 2.6 10*3/uL (ref 1.5–6.5)
Neutrophils Relative %: 55 %
Platelet Count: 239 10*3/uL (ref 145–400)
RBC: 3.94 MIL/uL (ref 3.70–5.45)
RDW: 13.7 % (ref 11.2–14.5)
WBC Count: 4.6 10*3/uL (ref 3.9–10.3)

## 2017-12-10 MED ORDER — SODIUM CHLORIDE 0.9% FLUSH
10.0000 mL | INTRAVENOUS | Status: DC | PRN
  Administered 2017-12-10: 10 mL
  Filled 2017-12-10: qty 10

## 2017-12-10 MED ORDER — LORAZEPAM 1 MG PO TABS: 1.0000 mg | ORAL_TABLET | Freq: Three times a day (TID) | ORAL | 0 refills | Status: DC | PRN

## 2017-12-10 MED ORDER — DEXAMETHASONE 4 MG PO TABS: 4.0000 mg | ORAL_TABLET | Freq: Every day | ORAL | 0 refills | Status: DC

## 2017-12-10 MED ORDER — HEPARIN SOD (PORK) LOCK FLUSH 100 UNIT/ML IV SOLN
500.0000 [IU] | Freq: Once | INTRAVENOUS | Status: AC | PRN
  Administered 2017-12-10: 500 [IU]
  Filled 2017-12-10: qty 5

## 2017-12-10 MED ORDER — ONDANSETRON HCL 8 MG PO TABS: 8.0000 mg | ORAL_TABLET | Freq: Three times a day (TID) | ORAL | 3 refills | Status: DC | PRN

## 2017-12-10 NOTE — Assessment & Plan Note (Signed)
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%, PR 10%, Ki-67 70%, HER-2 positive, T1 CN 0 stage Ia Breast MRI 11/25/2017: 4.4 x 2.8 x 2.1 cm right breast malignancy UOQ, second focus 0.8 cm (biopsy planned for 12/09/2017), overall extent of disease is 7 cm  Treatment plan: 1.Neoadjuvant chemotherapy with [place] followed by Herceptin and Perjeta maintenance for 1 year 2.breast conserving surgery with sentinel lymph node biopsy 3.Radiation therapy 4.Adjuvant antiestrogen therapy -------------------------------------------------------------------------------------------------------------------------------- I discussed with the patient more extensively about the chemotherapy related side effects.  We went over the antiemetics as well as dexamethasone prescriptions.  Return to clinic: 1 week for toxicity check.

## 2017-12-10 NOTE — Progress Notes (Signed)
Patient Care Team: Nunzio Cobbs, MD as PCP - General (Obstetrics and Gynecology) Macario Carls, MD as Referring Physician (Specialist) Erroll Luna, MD as Consulting Physician (General Surgery) Nicholas Lose, MD as Consulting Physician (Hematology and Oncology) Kyung Rudd, MD as Consulting Physician (Radiation Oncology)  DIAGNOSIS:  Encounter Diagnosis  Name Primary?  . Malignant neoplasm of upper-outer quadrant of right breast in female, estrogen receptor positive ([place])     SUMMARY OF ONCOLOGIC HISTORY:   Malignant neoplasm of upper-outer quadrant of right breast in female, estrogen receptor positive ([place])   11/11/2017 Initial Diagnosis    Palpable right breast mass at 10 o'clock position 1.7 cm, axilla negative, biopsy revealed grade 3 IDC ER 60%, PR 10%, Ki-67 70%, HER-2 positive, T1 CN 0 stage Ia    11/19/2017 Cancer Staging    Staging form: Breast, AJCC 8th Edition - Clinical: Stage IB (cT2, cN0, cM0, G3, ER+, PR+, HER2+) - Signed by Nicholas Lose, MD on 12/04/2017    11/25/2017 Breast MRI    4.4 x 2.8 x 2.1 cm right breast malignancy UOQ, second focus 0.8 cm (biopsy planned for 12/09/2017)    12/04/2017 -  Neo-Adjuvant Chemotherapy    Initial plan was with Taxol Herceptin but it was switched to [facility] Perjeta every 3 week x6 followed by Herceptin plus/minus Perjeta maintenance when the breast MRI showed more extensive disease.     CHIEF COMPLIANT: Tomorrow will be cycle 1 of TCH Perjeta  INTERVAL HISTORY: Sandra [name] is a 52 year old with above-mentioned history of right breast cancer who will be coming tomorrow to receive her first cycle of [place].  Possibly she received just Herceptin alone and she appears to have tolerated it extremely well.  She denies any nausea vomiting or diarrhea.  She is here today accompanied by her husband to discuss the plan.  REVIEW OF SYSTEMS:   Constitutional: Denies fevers, chills or abnormal weight loss Eyes: Denies  blurriness of vision Ears, nose, mouth, throat, and face: Denies mucositis or sore throat Respiratory: Denies cough, dyspnea or wheezes Cardiovascular: Denies palpitation, chest discomfort Gastrointestinal:  Denies nausea, heartburn or change in bowel habits Skin: Denies abnormal skin rashes Lymphatics: Denies new lymphadenopathy or easy bruising Neurological:Denies numbness, tingling or new weaknesses Behavioral/Psych: Mood is stable, no new changes  Extremities: No lower extremity edema   All other systems were reviewed with the patient and are negative.  I have reviewed the past medical history, past surgical history, social history and family history with the patient and they are unchanged from previous note.  ALLERGIES:  has No Known Allergies.  MEDICATIONS:  Current Outpatient Medications  Medication Sig Dispense Refill  . betamethasone valerate ointment (VALISONE) 0.1 % Apply bid for 1-2 weeks as needed for flare of lichen sclerosis. 15 g 1  . dexamethasone (DECADRON) 4 MG tablet Take 1 tablet (4 mg total) by mouth daily. 1 tab day before chemo and 1 tab day after chemo 12 tablet 0  . ibuprofen (ADVIL,MOTRIN) 800 MG tablet Take 1 tablet (800 mg total) by mouth every 8 (eight) hours as needed. 30 tablet 0  . LORazepam (ATIVAN) 1 MG tablet Take 1 tablet (1 mg total) by mouth every 8 (eight) hours as needed for anxiety (or nausea). 30 tablet 0  . Omega-3 Fatty Acids (FISH OIL) 1000 MG CAPS Take 1 capsule by mouth daily.    . ondansetron (ZOFRAN) 8 MG tablet Take 1 tablet (8 mg total) by mouth every 8 (eight)  hours as needed for nausea. 30 tablet 3  . venlafaxine XR (EFFEXOR-XR) 75 MG 24 hr capsule Take 1 capsule (75 mg total) by mouth daily. 30 capsule 0   No current facility-administered medications for this visit.     PHYSICAL EXAMINATION: ECOG PERFORMANCE STATUS: 1 - Symptomatic but completely ambulatory  Vitals:   12/10/17 1205  BP: (!) 150/78  Pulse: 83  Resp: 20    Temp: 98.3 F (36.8 C)  SpO2: 99%   Filed Weights   12/10/17 1205  Weight: 219 lb 6.4 oz (99.5 kg)    GENERAL:alert, no distress and comfortable SKIN: skin color, texture, turgor are normal, no rashes or significant lesions EYES: normal, Conjunctiva are pink and non-injected, sclera clear OROPHARYNX:no exudate, no erythema and lips, buccal mucosa, and tongue normal  NECK: supple, thyroid normal size, non-tender, without nodularity LYMPH:  no palpable lymphadenopathy in the cervical, axillary or inguinal LUNGS: clear to auscultation and percussion with normal breathing effort HEART: regular rate & rhythm and no murmurs and no lower extremity edema ABDOMEN:abdomen soft, non-tender and normal bowel sounds MUSCULOSKELETAL:no cyanosis of digits and no clubbing  NEURO: alert & oriented x 3 with fluent speech, no focal motor/sensory deficits EXTREMITIES: No lower extremity edema   LABORATORY DATA:  I have reviewed the data as listed CMP Latest Ref Rng & Units 12/10/2017 12/04/2017 11/19/2017  Glucose 70 - 99 mg/dL 121(H) 89 89  BUN 6 - 20 mg/dL _0 Creatinine 0.44 - 1.00 mg/dL 0.90 0.81 0.84  Sodium 135 - 145 mmol/L 138 140 139  Potassium 3.5 - 5.1 mmol/L 3.6 3.7 3.9  Chloride 98 - 111 mmol/L 106 107 104  CO2 22 - 32 mmol/L _1 Calcium 8.9 - 10.3 mg/dL 8.8(L) 9.1 9.0  Total Protein 6.5 - 8.1 g/dL 6.6 7.1 7.3  Total Bilirubin 0.3 - 1.2 mg/dL 0.3 0.3 0.4  Alkaline Phos 38 - 126 U/L 55 55 60  AST 15 - 41 U/L 15 16 11(L)  ALT 0 - 44 U/L _2 Lab Results  Component Value Date   WBC 4.6 12/10/2017   HGB 12.0 12/10/2017   HCT 35.4 12/10/2017   MCV 89.8 12/10/2017   PLT 239 12/10/2017   NEUTROABS 2.6 12/10/2017    ASSESSMENT & PLAN:  Malignant neoplasm of upper-outer quadrant of right breast in female, estrogen receptor positive ([place]) 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%, PR 10%,  Ki-67 70%, HER-2 positive, T1 CN 0 stage Ia Breast MRI 11/25/2017: 4.4 x 2.8 x 2.1 cm right breast malignancy UOQ, second focus 0.8 cm (biopsy planned for 12/09/2017), overall extent of disease is 7 cm  Treatment plan: 1.Neoadjuvant chemotherapy with [place] followed by Herceptin and Perjeta maintenance for 1 year 2.breast conserving surgery with sentinel lymph node biopsy 3.Radiation therapy 4.Adjuvant antiestrogen therapy -------------------------------------------------------------------------------------------------------------------------------- I discussed with the patient more extensively about the chemotherapy related side effects.  We went over the antiemetics as well as dexamethasone prescriptions. She is scheduled for breast MRI this weekend. Return to clinic: 1 week for toxicity check.    No orders of the defined types were placed in this encounter.  The patient has a good understanding of the overall plan. she agrees with it. she will call with any problems that may develop before the next visit here.   Harriette Ohara, MD 12/10/17

## 2017-12-11 ENCOUNTER — Encounter: Payer: Self-pay | Admitting: *Deleted

## 2017-12-11 ENCOUNTER — Encounter: Payer: Self-pay | Admitting: Hematology and Oncology

## 2017-12-11 ENCOUNTER — Other Ambulatory Visit: Payer: Self-pay | Admitting: *Deleted

## 2017-12-11 ENCOUNTER — Inpatient Hospital Stay: Payer: BLUE CROSS/BLUE SHIELD

## 2017-12-11 VITALS — BP 124/69 | HR 72 | Temp 98.4°F | Resp 17 | Wt 215.8 lb

## 2017-12-11 DIAGNOSIS — Z170 Estrogen receptor positive status [ER+]: Secondary | ICD-10-CM | POA: Diagnosis not present

## 2017-12-11 DIAGNOSIS — Z5111 Encounter for antineoplastic chemotherapy: Secondary | ICD-10-CM | POA: Diagnosis not present

## 2017-12-11 DIAGNOSIS — Z79899 Other long term (current) drug therapy: Secondary | ICD-10-CM | POA: Diagnosis not present

## 2017-12-11 DIAGNOSIS — C50411 Malignant neoplasm of upper-outer quadrant of right female breast: Secondary | ICD-10-CM

## 2017-12-11 DIAGNOSIS — R530 Neoplastic (malignant) related fatigue: Secondary | ICD-10-CM | POA: Diagnosis not present

## 2017-12-11 DIAGNOSIS — R197 Diarrhea, unspecified: Secondary | ICD-10-CM | POA: Diagnosis not present

## 2017-12-11 DIAGNOSIS — Z5189 Encounter for other specified aftercare: Secondary | ICD-10-CM | POA: Diagnosis not present

## 2017-12-11 DIAGNOSIS — Z5112 Encounter for antineoplastic immunotherapy: Secondary | ICD-10-CM | POA: Diagnosis not present

## 2017-12-11 DIAGNOSIS — M898X9 Other specified disorders of bone, unspecified site: Secondary | ICD-10-CM | POA: Diagnosis not present

## 2017-12-11 DIAGNOSIS — Z006 Encounter for examination for normal comparison and control in clinical research program: Secondary | ICD-10-CM

## 2017-12-11 MED ORDER — SODIUM CHLORIDE 0.9 % IV SOLN
Freq: Once | INTRAVENOUS | Status: AC
  Administered 2017-12-11: 08:00:00 via INTRAVENOUS
  Filled 2017-12-11: qty 250

## 2017-12-11 MED ORDER — SODIUM CHLORIDE 0.9 % IV SOLN
420.0000 mg | Freq: Once | INTRAVENOUS | Status: AC
  Administered 2017-12-11: 420 mg via INTRAVENOUS
  Filled 2017-12-11: qty 14

## 2017-12-11 MED ORDER — SODIUM CHLORIDE 0.9% FLUSH
10.0000 mL | INTRAVENOUS | Status: DC | PRN
  Administered 2017-12-11: 10 mL
  Filled 2017-12-11: qty 10

## 2017-12-11 MED ORDER — DIPHENHYDRAMINE HCL 25 MG PO CAPS
ORAL_CAPSULE | ORAL | Status: AC
  Filled 2017-12-11: qty 2

## 2017-12-11 MED ORDER — LOPERAMIDE HCL 2 MG PO TABS
4.0000 mg | ORAL_TABLET | Freq: Once | ORAL | Status: DC
  Filled 2017-12-11: qty 2

## 2017-12-11 MED ORDER — PALONOSETRON HCL INJECTION 0.25 MG/5ML
0.2500 mg | Freq: Once | INTRAVENOUS | Status: AC
  Administered 2017-12-11: 0.25 mg via INTRAVENOUS

## 2017-12-11 MED ORDER — PALONOSETRON HCL INJECTION 0.25 MG/5ML
INTRAVENOUS | Status: AC
  Filled 2017-12-11: qty 5

## 2017-12-11 MED ORDER — TRASTUZUMAB CHEMO 150 MG IV SOLR
600.0000 mg | Freq: Once | INTRAVENOUS | Status: AC
  Administered 2017-12-11: 600 mg via INTRAVENOUS
  Filled 2017-12-11: qty 28.57

## 2017-12-11 MED ORDER — HEPARIN SOD (PORK) LOCK FLUSH 100 UNIT/ML IV SOLN
500.0000 [IU] | Freq: Once | INTRAVENOUS | Status: AC | PRN
  Administered 2017-12-11: 500 [IU]
  Filled 2017-12-11: qty 5

## 2017-12-11 MED ORDER — DIPHENHYDRAMINE HCL 25 MG PO CAPS
50.0000 mg | ORAL_CAPSULE | Freq: Once | ORAL | Status: AC
  Administered 2017-12-11: 50 mg via ORAL

## 2017-12-11 MED ORDER — LOPERAMIDE HCL 2 MG PO CAPS
ORAL_CAPSULE | ORAL | Status: AC
  Filled 2017-12-11: qty 2

## 2017-12-11 MED ORDER — SODIUM CHLORIDE 0.9 % IV SOLN
Freq: Once | INTRAVENOUS | Status: AC
  Administered 2017-12-11: 12:00:00 via INTRAVENOUS
  Filled 2017-12-11: qty 5

## 2017-12-11 MED ORDER — SODIUM CHLORIDE 0.9 % IV SOLN
700.0000 mg | Freq: Once | INTRAVENOUS | Status: AC
  Administered 2017-12-11: 700 mg via INTRAVENOUS
  Filled 2017-12-11: qty 70

## 2017-12-11 MED ORDER — SODIUM CHLORIDE 0.9 % IV SOLN
75.0000 mg/m2 | Freq: Once | INTRAVENOUS | Status: AC
  Administered 2017-12-11: 170 mg via INTRAVENOUS
  Filled 2017-12-11: qty 17

## 2017-12-11 MED ORDER — ACETAMINOPHEN 325 MG PO TABS: 650.0000 mg | ORAL_TABLET | Freq: Once | ORAL | Status: DC

## 2017-12-11 NOTE — Progress Notes (Signed)
Nurse informed that patient having loose stool and cramping after Perjeta administration.  Ok by Dr. Lindi Adie for Imodium 4mg  PO once for loose stools.  Infusion nurse aware.

## 2017-12-11 NOTE — Progress Notes (Signed)
Copy of patient's baseline research lab results given to patient for her records.  Dr. Lindi Adie reviewed results and states no intervention required at this time. Patient states she is working with her PCP to lower her cholesterol.   Informed patient of research labs to be drawn again in 3 weeks on 10/3 along with her regular lab appointment.  She does not need to fast for this lab.  Patient verbalized understanding.  Thanked patient very much for her time and participation in this study.  Foye Spurling, BSN, RN Clinical Research Nurse 12/11/2017 2:34 PM

## 2017-12-11 NOTE — Progress Notes (Signed)
@   1440 Pt stated after returning from the bathroom that her "stool was much looser than normal and some cramping is accompanying it" Pt states she is "on her cycle and that may be why the cramping".  Kathlee [employer] engineer for Dr Lindi Adie notified, she will inform Dr Lindi Adie. Will continue to monitor pt.   Per Dr Lindi Adie pt to receive 4 mg. of  Imodium while in tx area. Pt Administered 4mg  of Imodium to self with Imodium she brought from home at 1446  As charted on MAR.

## 2017-12-11 NOTE — Patient Instructions (Signed)
[facility] Discharge Instructions for Patients Receiving Chemotherapy  Today you received the following chemotherapy agents Herceptin, Perjeta, Taxotere and Carboplatin  To help prevent nausea and vomiting after your treatment, we encourage you to take your nausea medication as prescribed.  If you develop nausea and vomiting that is not controlled by your nausea medication, call the clinic.   BELOW ARE SYMPTOMS THAT SHOULD BE REPORTED IMMEDIATELY:  *FEVER GREATER THAN 100.5 F  *CHILLS WITH OR WITHOUT FEVER  NAUSEA AND VOMITING THAT IS NOT CONTROLLED WITH YOUR NAUSEA MEDICATION  *UNUSUAL SHORTNESS OF BREATH  *UNUSUAL BRUISING OR BLEEDING  TENDERNESS IN MOUTH AND THROAT WITH OR WITHOUT PRESENCE OF ULCERS  *URINARY PROBLEMS  *BOWEL PROBLEMS  UNUSUAL RASH Items with * indicate a potential emergency and should be followed up as soon as possible.  Feel free to call the clinic should you have any questions or concerns. The clinic phone number is [phone].  Please show the [facility] at check-in to the Emergency Department and triage nurse.  Trastuzumab injection for infusion (Herceptin) What is this medicine? TRASTUZUMAB (tras TOO zoo mab) is a monoclonal antibody. It is used to treat breast cancer and stomach cancer. This medicine may be used for other purposes; ask your health care provider or pharmacist if you have questions. COMMON BRAND NAME(S): Herceptin What should I tell my health care provider before I take this medicine? They need to know if you have any of these conditions: -heart disease -heart failure -lung or breathing disease, like asthma -an unusual or allergic reaction to trastuzumab, benzyl alcohol, or other medications, foods, dyes, or preservatives -pregnant or trying to get pregnant -breast-feeding How should I use this medicine? This drug is given as an infusion into a vein. It is administered in a hospital or clinic by a  specially trained health care professional. Talk to your pediatrician regarding the use of this medicine in children. This medicine is not approved for use in children. Overdosage: If you think you have taken too much of this medicine contact a poison control center or emergency room at once. NOTE: This medicine is only for you. Do not share this medicine with others. What if I miss a dose? It is important not to miss a dose. Call your doctor or health care professional if you are unable to keep an appointment. What may interact with this medicine? This medicine may interact with the following medications: -certain types of chemotherapy, such as daunorubicin, doxorubicin, epirubicin, and idarubicin This list may not describe all possible interactions. Give your health care provider a list of all the medicines, herbs, non-prescription drugs, or dietary supplements you use. Also tell them if you smoke, drink alcohol, or use illegal drugs. Some items may interact with your medicine. What should I watch for while using this medicine? Visit your doctor for checks on your progress. Report any side effects. Continue your course of treatment even though you feel ill unless your doctor tells you to stop. Call your doctor or health care professional for advice if you get a fever, chills or sore throat, or other symptoms of a cold or flu. Do not treat yourself. Try to avoid being around people who are sick. You may experience fever, chills and shaking during your first infusion. These effects are usually mild and can be treated with other medicines. Report any side effects during the infusion to your health care professional. Fever and chills usually do not happen with later infusions. Do  not become pregnant while taking this medicine or for 7 months after stopping it. Women should inform their doctor if they wish to become pregnant or think they might be pregnant. Women of child-bearing potential will need to  have a negative pregnancy test before starting this medicine. There is a potential for serious side effects to an unborn child. Talk to your health care professional or pharmacist for more information. Do not breast-feed an infant while taking this medicine or for 7 months after stopping it. Women must use effective birth control with this medicine. What side effects may I notice from receiving this medicine? Side effects that you should report to your doctor or health care professional as soon as possible: -allergic reactions like skin rash, itching or hives, swelling of the face, lips, or tongue -chest pain or palpitations -cough -dizziness -feeling faint or lightheaded, falls -fever -general ill feeling or flu-like symptoms -signs of worsening heart failure like breathing problems; swelling in your legs and feet -unusually weak or tired Side effects that usually do not require medical attention (report to your doctor or health care professional if they continue or are bothersome): -bone pain -changes in taste -diarrhea -joint pain -nausea/vomiting -weight loss This list may not describe all possible side effects. Call your doctor for medical advice about side effects. You may report side effects to FDA at 1-800-FDA-1088. Where should I keep my medicine? This drug is given in a hospital or clinic and will not be stored at home. NOTE: This sheet is a summary. It may not cover all possible information. If you have questions about this medicine, talk to your doctor, pharmacist, or health care provider.  2018 Elsevier/Gold Standard (2016-03-12 14:37:52)   Pertuzumab injection (Perjeta) What is this medicine? PERTUZUMAB (per TOOZ ue mab) is a monoclonal antibody. It is used to treat breast cancer. This medicine may be used for other purposes; ask your health care provider or pharmacist if you have questions. COMMON BRAND NAME(S): PERJETA What should I tell my health care provider before  I take this medicine? They need to know if you have any of these conditions: -heart disease -heart failure -high blood pressure -history of irregular heart beat -recent or ongoing radiation therapy -an unusual or allergic reaction to pertuzumab, other medicines, foods, dyes, or preservatives -pregnant or trying to get pregnant -breast-feeding How should I use this medicine? This medicine is for infusion into a vein. It is given by a health care professional in a hospital or clinic setting. Talk to your pediatrician regarding the use of this medicine in children. Special care may be needed. Overdosage: If you think you have taken too much of this medicine contact a poison control center or emergency room at once. NOTE: This medicine is only for you. Do not share this medicine with others. What if I miss a dose? It is important not to miss your dose. Call your doctor or health care professional if you are unable to keep an appointment. What may interact with this medicine? Interactions are not expected. Give your health care provider a list of all the medicines, herbs, non-prescription drugs, or dietary supplements you use. Also tell them if you smoke, drink alcohol, or use illegal drugs. Some items may interact with your medicine. This list may not describe all possible interactions. Give your health care provider a list of all the medicines, herbs, non-prescription drugs, or dietary supplements you use. Also tell them if you smoke, drink alcohol, or use illegal drugs. Some  items may interact with your medicine. What should I watch for while using this medicine? Your condition will be monitored carefully while you are receiving this medicine. Report any side effects. Continue your course of treatment even though you feel ill unless your doctor tells you to stop. Do not become pregnant while taking this medicine or for 7 months after stopping it. Women should inform their doctor if they wish to  become pregnant or think they might be pregnant. Women of child-bearing potential will need to have a negative pregnancy test before starting this medicine. There is a potential for serious side effects to an unborn child. Talk to your health care professional or pharmacist for more information. Do not breast-feed an infant while taking this medicine or for 7 months after stopping it. Women must use effective birth control with this medicine. Call your doctor or health care professional for advice if you get a fever, chills or sore throat, or other symptoms of a cold or flu. Do not treat yourself. Try to avoid being around people who are sick. You may experience fever, chills, and headache during the infusion. Report any side effects during the infusion to your health care professional. What side effects may I notice from receiving this medicine? Side effects that you should report to your doctor or health care professional as soon as possible: -breathing problems -chest pain or palpitations -dizziness -feeling faint or lightheaded -fever or chills -skin rash, itching or hives -sore throat -swelling of the face, lips, or tongue -swelling of the legs or ankles -unusually weak or tired Side effects that usually do not require medical attention (report to your doctor or health care professional if they continue or are bothersome): -diarrhea -hair loss -nausea, vomiting -tiredness This list may not describe all possible side effects. Call your doctor for medical advice about side effects. You may report side effects to FDA at 1-800-FDA-1088. Where should I keep my medicine? This drug is given in a hospital or clinic and will not be stored at home. NOTE: This sheet is a summary. It may not cover all possible information. If you have questions about this medicine, talk to your doctor, pharmacist, or health care provider.  2018 Elsevier/Gold Standard (2015-04-20 12:08:50)   Docetaxel injection  (Taxotere) What is this medicine? DOCETAXEL (doe se TAX el) is a chemotherapy drug. It targets fast dividing cells, like cancer cells, and causes these cells to die. This medicine is used to treat many types of cancers like breast cancer, certain stomach cancers, head and neck cancer, lung cancer, and prostate cancer. This medicine may be used for other purposes; ask your health care provider or pharmacist if you have questions. COMMON BRAND NAME(S): Docefrez, Taxotere What should I tell my health care provider before I take this medicine? They need to know if you have any of these conditions: -infection (especially a virus infection such as chickenpox, cold sores, or herpes) -liver disease -low blood counts, like low white cell, platelet, or red cell counts -an unusual or allergic reaction to docetaxel, polysorbate 80, other chemotherapy agents, other medicines, foods, dyes, or preservatives -pregnant or trying to get pregnant -breast-feeding How should I use this medicine? This drug is given as an infusion into a vein. It is administered in a hospital or clinic by a specially trained health care professional. Talk to your pediatrician regarding the use of this medicine in children. Special care may be needed. Overdosage: If you think you have taken too much of this  medicine contact a poison control center or emergency room at once. NOTE: This medicine is only for you. Do not share this medicine with others. What if I miss a dose? It is important not to miss your dose. Call your doctor or health care professional if you are unable to keep an appointment. What may interact with this medicine? -cyclosporine -erythromycin -ketoconazole -medicines to increase blood counts like filgrastim, pegfilgrastim, sargramostim -vaccines Talk to your doctor or health care professional before taking any of these medicines: -acetaminophen -aspirin -ibuprofen -ketoprofen -naproxen This list may not  describe all possible interactions. Give your health care provider a list of all the medicines, herbs, non-prescription drugs, or dietary supplements you use. Also tell them if you smoke, drink alcohol, or use illegal drugs. Some items may interact with your medicine. What should I watch for while using this medicine? Your condition will be monitored carefully while you are receiving this medicine. You will need important blood work done while you are taking this medicine. This drug may make you feel generally unwell. This is not uncommon, as chemotherapy can affect healthy cells as well as cancer cells. Report any side effects. Continue your course of treatment even though you feel ill unless your doctor tells you to stop. In some cases, you may be given additional medicines to help with side effects. Follow all directions for their use. Call your doctor or health care professional for advice if you get a fever, chills or sore throat, or other symptoms of a cold or flu. Do not treat yourself. This drug decreases your body's ability to fight infections. Try to avoid being around people who are sick. This medicine may increase your risk to bruise or bleed. Call your doctor or health care professional if you notice any unusual bleeding. This medicine may contain alcohol in the product. You may get drowsy or dizzy. Do not drive, use machinery, or do anything that needs mental alertness until you know how this medicine affects you. Do not stand or sit up quickly, especially if you are an older patient. This reduces the risk of dizzy or fainting spells. Avoid alcoholic drinks. Do not become pregnant while taking this medicine. Women should inform their doctor if they wish to become pregnant or think they might be pregnant. There is a potential for serious side effects to an unborn child. Talk to your health care professional or pharmacist for more information. Do not breast-feed an infant while taking this  medicine. What side effects may I notice from receiving this medicine? Side effects that you should report to your doctor or health care professional as soon as possible: -allergic reactions like skin rash, itching or hives, swelling of the face, lips, or tongue -low blood counts - This drug may decrease the number of white blood cells, red blood cells and platelets. You may be at increased risk for infections and bleeding. -signs of infection - fever or chills, cough, sore throat, pain or difficulty passing urine -signs of decreased platelets or bleeding - bruising, pinpoint red spots on the skin, black, tarry stools, nosebleeds -signs of decreased red blood cells - unusually weak or tired, fainting spells, lightheadedness -breathing problems -fast or irregular heartbeat -low blood pressure -mouth sores -nausea and vomiting -pain, swelling, redness or irritation at the injection site -pain, tingling, numbness in the hands or feet -swelling of the ankle, feet, hands -weight gain Side effects that usually do not require medical attention (report to your doctor or health care professional  if they continue or are bothersome): -bone pain -complete hair loss including hair on your head, underarms, pubic hair, eyebrows, and eyelashes -diarrhea -excessive tearing -changes in the color of fingernails -loosening of the fingernails -nausea -muscle pain -red flush to skin -sweating -weak or tired This list may not describe all possible side effects. Call your doctor for medical advice about side effects. You may report side effects to FDA at 1-800-FDA-1088. Where should I keep my medicine? This drug is given in a hospital or clinic and will not be stored at home. NOTE: This sheet is a summary. It may not cover all possible information. If you have questions about this medicine, talk to your doctor, pharmacist, or health care provider.  2018 Elsevier/Gold Standard (2015-04-20  12:32:56)   Carboplatin injection What is this medicine? CARBOPLATIN (KAR boe pla tin) is a chemotherapy drug. It targets fast dividing cells, like cancer cells, and causes these cells to die. This medicine is used to treat ovarian cancer and many other cancers. This medicine may be used for other purposes; ask your health care provider or pharmacist if you have questions. COMMON BRAND NAME(S): Paraplatin What should I tell my health care provider before I take this medicine? They need to know if you have any of these conditions: -blood disorders -hearing problems -kidney disease -recent or ongoing radiation therapy -an unusual or allergic reaction to carboplatin, cisplatin, other chemotherapy, other medicines, foods, dyes, or preservatives -pregnant or trying to get pregnant -breast-feeding How should I use this medicine? This drug is usually given as an infusion into a vein. It is administered in a hospital or clinic by a specially trained health care professional. Talk to your pediatrician regarding the use of this medicine in children. Special care may be needed. Overdosage: If you think you have taken too much of this medicine contact a poison control center or emergency room at once. NOTE: This medicine is only for you. Do not share this medicine with others. What if I miss a dose? It is important not to miss a dose. Call your doctor or health care professional if you are unable to keep an appointment. What may interact with this medicine? -medicines for seizures -medicines to increase blood counts like filgrastim, pegfilgrastim, sargramostim -some antibiotics like amikacin, gentamicin, neomycin, streptomycin, tobramycin -vaccines Talk to your doctor or health care professional before taking any of these medicines: -acetaminophen -aspirin -ibuprofen -ketoprofen -naproxen This list may not describe all possible interactions. Give your health care provider a list of all the  medicines, herbs, non-prescription drugs, or dietary supplements you use. Also tell them if you smoke, drink alcohol, or use illegal drugs. Some items may interact with your medicine. What should I watch for while using this medicine? Your condition will be monitored carefully while you are receiving this medicine. You will need important blood work done while you are taking this medicine. This drug may make you feel generally unwell. This is not uncommon, as chemotherapy can affect healthy cells as well as cancer cells. Report any side effects. Continue your course of treatment even though you feel ill unless your doctor tells you to stop. In some cases, you may be given additional medicines to help with side effects. Follow all directions for their use. Call your doctor or health care professional for advice if you get a fever, chills or sore throat, or other symptoms of a cold or flu. Do not treat yourself. This drug decreases your body's ability to fight infections. Try  to avoid being around people who are sick. This medicine may increase your risk to bruise or bleed. Call your doctor or health care professional if you notice any unusual bleeding. Be careful brushing and flossing your teeth or using a toothpick because you may get an infection or bleed more easily. If you have any dental work done, tell your dentist you are receiving this medicine. Avoid taking products that contain aspirin, acetaminophen, ibuprofen, naproxen, or ketoprofen unless instructed by your doctor. These medicines may hide a fever. Do not become pregnant while taking this medicine. Women should inform their doctor if they wish to become pregnant or think they might be pregnant. There is a potential for serious side effects to an unborn child. Talk to your health care professional or pharmacist for more information. Do not breast-feed an infant while taking this medicine. What side effects may I notice from receiving this  medicine? Side effects that you should report to your doctor or health care professional as soon as possible: -allergic reactions like skin rash, itching or hives, swelling of the face, lips, or tongue -signs of infection - fever or chills, cough, sore throat, pain or difficulty passing urine -signs of decreased platelets or bleeding - bruising, pinpoint red spots on the skin, black, tarry stools, nosebleeds -signs of decreased red blood cells - unusually weak or tired, fainting spells, lightheadedness -breathing problems -changes in hearing -changes in vision -chest pain -high blood pressure -low blood counts - This drug may decrease the number of white blood cells, red blood cells and platelets. You may be at increased risk for infections and bleeding. -nausea and vomiting -pain, swelling, redness or irritation at the injection site -pain, tingling, numbness in the hands or feet -problems with balance, talking, walking -trouble passing urine or change in the amount of urine Side effects that usually do not require medical attention (report to your doctor or health care professional if they continue or are bothersome): -hair loss -loss of appetite -metallic taste in the mouth or changes in taste This list may not describe all possible side effects. Call your doctor for medical advice about side effects. You may report side effects to FDA at 1-800-FDA-1088. Where should I keep my medicine? This drug is given in a hospital or clinic and will not be stored at home. NOTE: This sheet is a summary. It may not cover all possible information. If you have questions about this medicine, talk to your doctor, pharmacist, or health care provider.  2018 Elsevier/Gold Standard (2007-06-23 14:38:05)

## 2017-12-11 NOTE — Progress Notes (Signed)
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  Pt gave me consent to apply in her behalf so I enrolled her in the [facility] program.  She wasapproved for $25,000 for Herceptin &$25,000 for Perjeta for a year from 12/11/17.  Pt's responsibility will be as little as $5 per infusion.  I also enrolled her in the [employer] program for $15,000 for 12 months.  Pt will pay $0 for her first treatment and $0 for each subsequent treatment (up to $7,200 per treatment).  Pt exceeds the income requirement for the [employer] as well as the [employer]. I gave her my card to contact me if she has any questions or concerns in the future.

## 2017-12-12 ENCOUNTER — Ambulatory Visit
Admission: RE | Admit: 2017-12-12 | Discharge: 2017-12-12 | Disposition: A | Discharge status: Home / Self Care | Payer: BLUE CROSS/BLUE SHIELD | Source: Ambulatory Visit | Attending: Hematology and Oncology | Admitting: Hematology and Oncology

## 2017-12-12 ENCOUNTER — Other Ambulatory Visit: Payer: Self-pay | Admitting: Hematology and Oncology

## 2017-12-12 DIAGNOSIS — C50411 Malignant neoplasm of upper-outer quadrant of right female breast: Secondary | ICD-10-CM

## 2017-12-12 DIAGNOSIS — N6311 Unspecified lump in the right breast, upper outer quadrant: Secondary | ICD-10-CM | POA: Diagnosis not present

## 2017-12-12 DIAGNOSIS — Z170 Estrogen receptor positive status [ER+]: Secondary | ICD-10-CM | POA: Diagnosis not present

## 2017-12-12 DIAGNOSIS — C50911 Malignant neoplasm of unspecified site of right female breast: Secondary | ICD-10-CM | POA: Diagnosis not present

## 2017-12-12 HISTORY — PX: BREAST BIOPSY: SHX20

## 2017-12-12 MED ORDER — GADOBENATE DIMEGLUMINE 529 MG/ML IV SOLN
19.0000 mL | Freq: Once | INTRAVENOUS | Status: AC | PRN
  Administered 2017-12-12: 19 mL via INTRAVENOUS

## 2017-12-12 NOTE — Progress Notes (Signed)
FMLA for spouse, Lauralye Kinn, mailed to patient address on file. No fax number provided to fax forms to.

## 2017-12-13 ENCOUNTER — Inpatient Hospital Stay: Payer: BLUE CROSS/BLUE SHIELD

## 2017-12-13 VITALS — BP 130/77 | HR 65 | Temp 98.3°F | Resp 17

## 2017-12-13 DIAGNOSIS — Z170 Estrogen receptor positive status [ER+]: Secondary | ICD-10-CM

## 2017-12-13 DIAGNOSIS — R197 Diarrhea, unspecified: Secondary | ICD-10-CM | POA: Diagnosis not present

## 2017-12-13 DIAGNOSIS — Z79899 Other long term (current) drug therapy: Secondary | ICD-10-CM | POA: Diagnosis not present

## 2017-12-13 DIAGNOSIS — Z5112 Encounter for antineoplastic immunotherapy: Secondary | ICD-10-CM | POA: Diagnosis not present

## 2017-12-13 DIAGNOSIS — R530 Neoplastic (malignant) related fatigue: Secondary | ICD-10-CM | POA: Diagnosis not present

## 2017-12-13 DIAGNOSIS — Z5189 Encounter for other specified aftercare: Secondary | ICD-10-CM | POA: Diagnosis not present

## 2017-12-13 DIAGNOSIS — C50411 Malignant neoplasm of upper-outer quadrant of right female breast: Secondary | ICD-10-CM

## 2017-12-13 DIAGNOSIS — Z5111 Encounter for antineoplastic chemotherapy: Secondary | ICD-10-CM | POA: Diagnosis not present

## 2017-12-13 DIAGNOSIS — M898X9 Other specified disorders of bone, unspecified site: Secondary | ICD-10-CM | POA: Diagnosis not present

## 2017-12-13 MED ORDER — PEGFILGRASTIM-CBQV 6 MG/0.6ML ~~LOC~~ SOSY
6.0000 mg | PREFILLED_SYRINGE | Freq: Once | SUBCUTANEOUS | Status: AC
  Administered 2017-12-13: 6 mg via SUBCUTANEOUS

## 2017-12-13 MED ORDER — PEGFILGRASTIM-CBQV 6 MG/0.6ML ~~LOC~~ SOSY
PREFILLED_SYRINGE | SUBCUTANEOUS | Status: AC
  Filled 2017-12-13: qty 0.6

## 2017-12-13 NOTE — Patient Instructions (Signed)
Pegfilgrastim injection What is this medicine? PEGFILGRASTIM (PEG fil gra stim) is a long-acting granulocyte colony-stimulating factor that stimulates the growth of neutrophils, a type of white blood cell important in the body's fight against infection. It is used to reduce the incidence of fever and infection in patients with certain types of cancer who are receiving chemotherapy that affects the bone marrow, and to increase survival after being exposed to high doses of radiation. This medicine may be used for other purposes; ask your health care provider or pharmacist if you have questions. COMMON BRAND NAME(S): Neulasta What should I tell my health care provider before I take this medicine? They need to know if you have any of these conditions: -kidney disease -latex allergy -ongoing radiation therapy -sickle cell disease -skin reactions to acrylic adhesives (On-Body Injector only) -an unusual or allergic reaction to pegfilgrastim, filgrastim, other medicines, foods, dyes, or preservatives -pregnant or trying to get pregnant -breast-feeding How should I use this medicine? This medicine is for injection under the skin. If you get this medicine at home, you will be taught how to prepare and give the pre-filled syringe or how to use the On-body Injector. Refer to the patient Instructions for Use for detailed instructions. Use exactly as directed. Tell your healthcare provider immediately if you suspect that the On-body Injector may not have performed as intended or if you suspect the use of the On-body Injector resulted in a missed or partial dose. It is important that you put your used needles and syringes in a special sharps container. Do not put them in a trash can. If you do not have a sharps container, call your pharmacist or healthcare provider to get one. Talk to your pediatrician regarding the use of this medicine in children. While this drug may be prescribed for selected conditions,  precautions do apply. Overdosage: If you think you have taken too much of this medicine contact a poison control center or emergency room at once. NOTE: This medicine is only for you. Do not share this medicine with others. What if I miss a dose? It is important not to miss your dose. Call your doctor or health care professional if you miss your dose. If you miss a dose due to an On-body Injector failure or leakage, a new dose should be administered as soon as possible using a single prefilled syringe for manual use. What may interact with this medicine? Interactions have not been studied. Give your health care provider a list of all the medicines, herbs, non-prescription drugs, or dietary supplements you use. Also tell them if you smoke, drink alcohol, or use illegal drugs. Some items may interact with your medicine. This list may not describe all possible interactions. Give your health care provider a list of all the medicines, herbs, non-prescription drugs, or dietary supplements you use. Also tell them if you smoke, drink alcohol, or use illegal drugs. Some items may interact with your medicine. What should I watch for while using this medicine? You may need blood work done while you are taking this medicine. If you are going to need a MRI, CT scan, or other procedure, tell your doctor that you are using this medicine (On-Body Injector only). What side effects may I notice from receiving this medicine? Side effects that you should report to your doctor or health care professional as soon as possible: -allergic reactions like skin rash, itching or hives, swelling of the face, lips, or tongue -dizziness -fever -pain, redness, or irritation at site   where injected -pinpoint red spots on the skin -red or dark-brown urine -shortness of breath or breathing problems -stomach or side pain, or pain at the shoulder -swelling -tiredness -trouble passing urine or change in the amount of urine Side  effects that usually do not require medical attention (report to your doctor or health care professional if they continue or are bothersome): -bone pain -muscle pain This list may not describe all possible side effects. Call your doctor for medical advice about side effects. You may report side effects to FDA at 1-800-FDA-1088. Where should I keep my medicine? Keep out of the reach of children. Store pre-filled syringes in a refrigerator between 2 and 8 degrees C (36 and 46 degrees F). Do not freeze. Keep in carton to protect from light. Throw away this medicine if it is left out of the refrigerator for more than 48 hours. Throw away any unused medicine after the expiration date. NOTE: This sheet is a summary. It may not cover all possible information. If you have questions about this medicine, talk to your doctor, pharmacist, or health care provider.  2018 Elsevier/Gold Standard (2016-03-14 12:58:03)  

## 2017-12-15 ENCOUNTER — Other Ambulatory Visit: Payer: Self-pay

## 2017-12-15 ENCOUNTER — Inpatient Hospital Stay (HOSPITAL_BASED_OUTPATIENT_CLINIC_OR_DEPARTMENT_OTHER): Payer: BLUE CROSS/BLUE SHIELD | Admitting: Genetic Counselor

## 2017-12-15 ENCOUNTER — Telehealth: Payer: Self-pay | Admitting: Genetic Counselor

## 2017-12-15 DIAGNOSIS — Z170 Estrogen receptor positive status [ER+]: Secondary | ICD-10-CM | POA: Diagnosis not present

## 2017-12-15 DIAGNOSIS — Z803 Family history of malignant neoplasm of breast: Secondary | ICD-10-CM

## 2017-12-15 DIAGNOSIS — Z800 Family history of malignant neoplasm of digestive organs: Secondary | ICD-10-CM | POA: Diagnosis not present

## 2017-12-15 DIAGNOSIS — Z8042 Family history of malignant neoplasm of prostate: Secondary | ICD-10-CM

## 2017-12-15 DIAGNOSIS — C50411 Malignant neoplasm of upper-outer quadrant of right female breast: Secondary | ICD-10-CM | POA: Diagnosis not present

## 2017-12-15 DIAGNOSIS — Z809 Family history of malignant neoplasm, unspecified: Secondary | ICD-10-CM

## 2017-12-15 DIAGNOSIS — Z315 Encounter for genetic counseling: Secondary | ICD-10-CM

## 2017-12-15 NOTE — Telephone Encounter (Signed)
She wants to proceed with genetic testing on Thursday.  I will leave a kit in the lab.

## 2017-12-16 ENCOUNTER — Encounter: Payer: Self-pay | Admitting: Genetic Counselor

## 2017-12-16 DIAGNOSIS — Z8042 Family history of malignant neoplasm of prostate: Secondary | ICD-10-CM | POA: Insufficient documentation

## 2017-12-16 DIAGNOSIS — Z803 Family history of malignant neoplasm of breast: Secondary | ICD-10-CM | POA: Insufficient documentation

## 2017-12-16 DIAGNOSIS — Z800 Family history of malignant neoplasm of digestive organs: Secondary | ICD-10-CM | POA: Insufficient documentation

## 2017-12-16 NOTE — Progress Notes (Signed)
REFERRING PROVIDER: Chauncey Cruel, MD [address] [place], [place] [address]  PRIMARY PROVIDER:  Nunzio Cobbs, MD  PRIMARY REASON FOR VISIT:  1. Malignant neoplasm of upper-outer quadrant of right breast in female, estrogen receptor positive ([place])   2. Family history of breast cancer   3. Family history of prostate cancer   4. Family history of colon cancer      HISTORY OF PRESENT ILLNESS:   Sandra [name], a 52 y.o. female, was seen for a [place] cancer genetics consultation at the request of Dr. Jana Hakim due to a personal and family history of cancer.  Sandra [name] presents to clinic today to discuss the possibility of a hereditary predisposition to cancer, genetic testing, and to further clarify her future cancer risks, as well as potential cancer risks for family members.   In 2019, at the age of 34, Sandra [name] was diagnosed with cancer of the right breast. This will be treated with chemotherapy, lumpectomy and radiation.  The tumor is triple positive.    CANCER HISTORY:    Malignant neoplasm of upper-outer quadrant of right breast in female, estrogen receptor positive ([place])   11/11/2017 Initial Diagnosis    Palpable right breast mass at 10 o'clock position 1.7 cm, axilla negative, biopsy revealed grade 3 IDC ER 60%, PR 10%, Ki-67 70%, HER-2 positive, T1 CN 0 stage Ia    11/19/2017 Cancer Staging    Staging form: Breast, AJCC 8th Edition - Clinical: Stage IB (cT2, cN0, cM0, G3, ER+, PR+, HER2+) - Signed by Nicholas Lose, MD on 12/04/2017    11/25/2017 Breast MRI    4.4 x 2.8 x 2.1 cm right breast malignancy UOQ, second focus 0.8 cm (biopsy planned for 12/09/2017)    12/04/2017 -  Neo-Adjuvant Chemotherapy    Initial plan was with Taxol Herceptin but it was switched to [facility] Perjeta every 3 week x6 followed by Herceptin plus/minus Perjeta maintenance when the breast MRI showed more extensive disease.      HORMONAL RISK FACTORS:  Menarche was at age 14-13.   First live birth at age 58.  OCP use for approximately 5+ years.  Ovaries intact: yes.  Hysterectomy: no.  Menopausal status: perimenopausal.  HRT use: 0 years. Colonoscopy: yes; normal. Mammogram within the last year: yes. Number of breast biopsies: 1. Up to date with pelvic exams:  yes. Any excessive radiation exposure in the past:  no  Past Medical History:  Diagnosis Date  . Breast cancer ([place])   . Depression   . Endometriosis, diagnosis via laparoscopy   . Family history of breast cancer   . Family history of breast cancer   . Family history of colon cancer   . Family history of prostate cancer   . Hyperlipidemia 2017  . Lichen sclerosus, vulva   . Low serum vitamin D 2017    Past Surgical History:  Procedure Laterality Date  . APPENDECTOMY  2005  . CESAREAN SECTION  2002  . MANDIBLE RECONSTRUCTION  1985   wires inside jaw according to pt  . PELVIC LAPAROSCOPY  2004  . PORTACATH PLACEMENT Right 12/03/2017   Procedure: INSERTION PORT-A-CATH;  Surgeon: Erroll Luna, MD;  Location: [facility];  Service: General;  Laterality: Right;  . [place]    Social History   Socioeconomic History  . Marital status: Married    Spouse name: Not on file  . Number of children: Not on file  . Years of  education: Not on file  . Highest education level: Not on file  Occupational History  . Not on file  Social Needs  . Financial resource strain: Not on file  . Food insecurity:    Worry: Not on file    Inability: Not on file  . Transportation needs:    Medical: Not on file    Non-medical: Not on file  Tobacco Use  . Smoking status: Never Smoker  . Smokeless tobacco: Never Used  Substance and Sexual Activity  . Alcohol use: No    Alcohol/week: 0.0 standard drinks  . Drug use: No  . Sexual activity: Yes    Partners: Male    Birth control/protection: IUD    Comment: Mirena inserted 05/16/16, IUD is to be removed08/29/19  Lifestyle  . Physical  activity:    Days per week: Not on file    Minutes per session: Not on file  . Stress: Not on file  Relationships  . Social connections:    Talks on phone: Not on file    Gets together: Not on file    Attends religious service: Not on file    Active member of club or organization: Not on file    Attends meetings of clubs or organizations: Not on file    Relationship status: Not on file  Other Topics Concern  . Not on file  Social History Narrative  . Not on file     FAMILY HISTORY:  We obtained a detailed, 4-generation family history.  Significant diagnoses are listed below: Family History  Problem Relation Age of Onset  . Stroke Mother   . Heart disease Mother   . Colon cancer Father   . Prostate cancer Father   . Hypertension Brother   . Cancer Maternal Uncle        NOS  . Breast cancer Paternal Aunt        dx in her 26s  . Prostate cancer Paternal Uncle   . Alcohol abuse Maternal Grandfather   . Breast cancer Paternal Grandmother        dx in her 73s  . Prostate cancer Paternal Grandfather   . SIDS Brother   . Cancer Paternal Aunt   . Tuberculosis Paternal Uncle     The patient has two daughters who are cancer free.  She has five living brothers who are cancer free, and one brother who died of SIDS. The patient's mother is deceased and her father is living.  The patient's mother died of heart disease at 2.  She had one sister and four brothers. One brother had lung cancer and the other had an unknown form of cancer.  The patient's maternal grandparents are deceased from non cancer related issues.  The patient's father had both colon and prostate cancer. He had three sisters and two brothers.  One sister had breast cancer, one had an unknown cancer, and one brother had prostate cancer.  The patient's paternal grandmother had breast cancer and the grandfather had prostate cancer.  Sandra [name] is unaware of previous family history of genetic testing for hereditary  cancer risks. Patient's maternal ancestors are of [place] descent, and paternal ancestors are of [place] descent. There is no reported Ashkenazi Jewish ancestry. There is no known consanguinity.  GENETIC COUNSELING ASSESSMENT: Sandra [name] is a 52 y.o. female with a personal history of breast cancer and family history of breast and prostate cancer which is somewhat suggestive of a hereditary cancer syndrome and predisposition to cancer.  We, therefore, discussed and recommended the following at today's visit.   DISCUSSION: We discussed that about 5-10% of breast cancer is hereditary with most cases due to BRCA mutations.  There are other genes that are associated with hereditary breast cancer syndromes as well, including ATM, CHEK2 and PALB2.  Prostate cancer can be associated with all of these genes as well.    We reviewed the characteristics, features and inheritance patterns of hereditary cancer syndromes. We also discussed genetic testing, including the appropriate family members to test, the process of testing, insurance coverage and turn-around-time for results. We discussed the implications of a negative, positive and/or variant of uncertain significant result. We recommended Sandra [name] pursue genetic testing for the multi-cancer gene panel. The Multi-Gene Panel offered by [facility] includes sequencing and/or deletion duplication testing of the following 84 genes: AIP, ALK, APC, ATM, AXIN2,BAP1,  BARD1, BLM, BMPR1A, BRCA1, BRCA2, BRIP1, CASR, CDC73, CDH1, CDK4, CDKN1B, CDKN1C, CDKN2A (p14ARF), CDKN2A (p16INK4a), CEBPA, CHEK2, CTNNA1, DICER1, DIS3L2, EGFR (c.2369C>T, p.Thr790Met variant only), EPCAM (Deletion/duplication testing only), FH, FLCN, GATA2, GPC3, GREM1 (Promoter region deletion/duplication testing only), HOXB13 (c.251G>A, p.Gly84Glu), HRAS, KIT, MAX, MEN1, MET, MITF (c.952G>A, p.Glu318Lys variant only), MLH1, MSH2, MSH3, MSH6, MUTYH, NBN, NF1, NF2, NTHL1, PALB2, PDGFRA, PHOX2B, PMS2, POLD1,  POLE, POT1, PRKAR1A, PTCH1, PTEN, RAD50, RAD51C, RAD51D, RB1, RECQL4, RET, RUNX1, SDHAF2, SDHA (sequence changes only), SDHB, SDHC, SDHD, SMAD4, SMARCA4, SMARCB1, SMARCE1, STK11, SUFU, TERC, TERT, TMEM127, TP53, TSC1, TSC2, VHL, WRN and WT1.    Based on Sandra [name]'s personal and family history of cancer, she meets medical criteria for genetic testing. Despite that she meets criteria, she may still have an out of pocket cost. We discussed that if her out of pocket cost for testing is over $100, the laboratory will call and confirm whether she wants to proceed with testing.  If the out of pocket cost of testing is less than $100 she will be billed by the genetic testing laboratory.   PLAN: After considering the risks, benefits, and limitations, Sandra [name]  provided informed consent to pursue genetic testing.  Blood will be drawn on Thursday, December 18, 2017 and will be sent to [facility] for analysis of the Multi-cancer panel. Results should be available within approximately 2-3 weeks' time, at which point they will be disclosed by telephone to Sandra [name], as will any additional recommendations warranted by these results. Sandra [name] will receive a summary of her genetic counseling visit and a copy of her results once available. This information will also be available in Epic. We encouraged Sandra [name] to remain in contact with cancer genetics annually so that we can continuously update the family history and inform her of any changes in cancer genetics and testing that may be of benefit for her family. Sandra [name] questions were answered to her satisfaction today. Our contact information was provided should additional questions or concerns arise.  Lastly, we encouraged Sandra [name] to remain in contact with cancer genetics annually so that we can continuously update the family history and inform her of any changes in cancer genetics and testing that may be of benefit for this family.   Ms.   [name] questions were answered to her satisfaction today. Our contact information was provided should additional questions or concerns arise. Thank you for the referral and allowing [place] to share in the care of your patient.   Karen P. Florene Glen, [place], [facility] Certified Genetic Counselor [email or web address] phone: [phone]  The patient was seen for a total of  45 minutes in face-to-face genetic counseling.  This patient was discussed with Drs. Magrinat, Lindi Adie and/or Burr Medico who agrees with the above.    _______________________________________________________________________ For Office Staff:  Number of people involved in session: 1 Was an Intern/ student involved with case: no

## 2017-12-17 ENCOUNTER — Other Ambulatory Visit: Payer: Self-pay

## 2017-12-17 ENCOUNTER — Ambulatory Visit: Payer: Self-pay

## 2017-12-17 NOTE — Assessment & Plan Note (Signed)
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%, PR 10%, Ki-67 70%, HER-2 positive, T1 CN 0 stage Ia Breast MRI 11/25/2017: 4.4 x 2.8 x 2.1 cm right breast malignancy UOQ, second focus 0.8 cm(biopsy plannedfor 12/09/2017), overall extent of disease is 7 cm  Treatment plan: 1.Neoadjuvant chemotherapy with Sabin followed by Herceptin and Perjeta maintenance for 1 year 2.breast conserving surgery with sentinel lymph node biopsy 3.Radiation therapy 4.Adjuvant antiestrogen therapy -------------------------------------------------------------------------------------------------------------------------------- Current Treatment: Cycle 1 day 8 TCHP Chemo Toxicities:   RTC in 2 weeks for cycle 2

## 2017-12-18 ENCOUNTER — Inpatient Hospital Stay: Payer: BLUE CROSS/BLUE SHIELD

## 2017-12-18 ENCOUNTER — Inpatient Hospital Stay: Payer: BLUE CROSS/BLUE SHIELD | Admitting: Hematology and Oncology

## 2017-12-18 ENCOUNTER — Telehealth: Payer: Self-pay | Admitting: Hematology and Oncology

## 2017-12-18 DIAGNOSIS — Z170 Estrogen receptor positive status [ER+]: Secondary | ICD-10-CM | POA: Diagnosis not present

## 2017-12-18 DIAGNOSIS — M898X9 Other specified disorders of bone, unspecified site: Secondary | ICD-10-CM

## 2017-12-18 DIAGNOSIS — C50411 Malignant neoplasm of upper-outer quadrant of right female breast: Secondary | ICD-10-CM | POA: Diagnosis not present

## 2017-12-18 DIAGNOSIS — R197 Diarrhea, unspecified: Secondary | ICD-10-CM | POA: Diagnosis not present

## 2017-12-18 DIAGNOSIS — Z79899 Other long term (current) drug therapy: Secondary | ICD-10-CM | POA: Diagnosis not present

## 2017-12-18 DIAGNOSIS — R530 Neoplastic (malignant) related fatigue: Secondary | ICD-10-CM | POA: Diagnosis not present

## 2017-12-18 DIAGNOSIS — R439 Unspecified disturbances of smell and taste: Secondary | ICD-10-CM

## 2017-12-18 DIAGNOSIS — Z5189 Encounter for other specified aftercare: Secondary | ICD-10-CM | POA: Diagnosis not present

## 2017-12-18 DIAGNOSIS — Z5112 Encounter for antineoplastic immunotherapy: Secondary | ICD-10-CM | POA: Diagnosis not present

## 2017-12-18 DIAGNOSIS — Z5111 Encounter for antineoplastic chemotherapy: Secondary | ICD-10-CM | POA: Diagnosis not present

## 2017-12-18 LAB — CBC WITH DIFFERENTIAL (CANCER CENTER ONLY)
Basophils Absolute: 0 10*3/uL (ref 0.0–0.1)
Basophils Relative: 0 %
Eosinophils Absolute: 0 10*3/uL (ref 0.0–0.5)
Eosinophils Relative: 1 %
HCT: 39.3 % (ref 34.8–46.6)
Hemoglobin: 13.1 g/dL (ref 11.6–15.9)
Lymphocytes Relative: 19 %
Lymphs Abs: 1.5 10*3/uL (ref 0.9–3.3)
MCH: 29.7 pg (ref 25.1–34.0)
MCHC: 33.2 g/dL (ref 31.5–36.0)
MCV: 89.5 fL (ref 79.5–101.0)
Monocytes Absolute: 1.4 10*3/uL — ABNORMAL HIGH (ref 0.1–0.9)
Monocytes Relative: 19 %
Neutro Abs: 4.8 10*3/uL (ref 1.5–6.5)
Neutrophils Relative %: 61 %
Platelet Count: 182 10*3/uL (ref 145–400)
RBC: 4.39 MIL/uL (ref 3.70–5.45)
RDW: 13.6 % (ref 11.2–14.5)
WBC Count: 7.8 10*3/uL (ref 3.9–10.3)

## 2017-12-18 LAB — CMP (CANCER CENTER ONLY)
ALT: 21 U/L (ref 0–44)
AST: 18 U/L (ref 15–41)
Albumin: 3.7 g/dL (ref 3.5–5.0)
Alkaline Phosphatase: 71 U/L (ref 38–126)
Anion gap: 9 (ref 5–15)
BUN: 9 mg/dL (ref 6–20)
CO2: 26 mmol/L (ref 22–32)
Calcium: 9.3 mg/dL (ref 8.9–10.3)
Chloride: 105 mmol/L (ref 98–111)
Creatinine: 0.99 mg/dL (ref 0.44–1.00)
GFR, Est AFR Am: >60 mL/min (ref >60)
GFR, Estimated: >60 mL/min (ref >60)
Glucose, Bld: 93 mg/dL (ref 70–99)
Potassium: 4.4 mmol/L (ref 3.5–5.1)
Sodium: 140 mmol/L (ref 135–145)
Total Bilirubin: 0.4 mg/dL (ref 0.3–1.2)
Total Protein: 7 g/dL (ref 6.5–8.1)

## 2017-12-18 NOTE — Progress Notes (Signed)
Patient Care Team: Nunzio Cobbs, MD as PCP - General (Obstetrics and Gynecology) Macario Carls, MD as Referring Physician (Specialist) Erroll Luna, MD as Consulting Physician (General Surgery) Nicholas Lose, MD as Consulting Physician (Hematology and Oncology) Kyung Rudd, MD as Consulting Physician (Radiation Oncology)  DIAGNOSIS:  Encounter Diagnosis  Name Primary?  . Malignant neoplasm of upper-outer quadrant of right breast in female, estrogen receptor positive ([place])     SUMMARY OF ONCOLOGIC HISTORY:   Malignant neoplasm of upper-outer quadrant of right breast in female, estrogen receptor positive ([place])   11/11/2017 Initial Diagnosis    Palpable right breast mass at 10 o'clock position 1.7 cm, axilla negative, biopsy revealed grade 3 IDC ER 60%, PR 10%, Ki-67 70%, HER-2 positive, T1 CN 0 stage Ia    11/19/2017 Cancer Staging    Staging form: Breast, AJCC 8th Edition - Clinical: Stage IB (cT2, cN0, cM0, G3, ER+, PR+, HER2+) - Signed by Nicholas Lose, MD on 12/04/2017    11/25/2017 Breast MRI    4.4 x 2.8 x 2.1 cm right breast malignancy UOQ, second focus 0.8 cm (biopsy planned for 12/09/2017)    12/04/2017 -  Neo-Adjuvant Chemotherapy    Initial plan was with Taxol Herceptin but it was switched to [facility] Perjeta every 3 week x6 followed by Herceptin plus/minus Perjeta maintenance when the breast MRI showed more extensive disease.     CHIEF COMPLIANT: Cycle 1 day 8 [facility] Perjeta  INTERVAL HISTORY: Sandra [name] is a 52 year old with above-mentioned history of right breast cancer who is currently on neoadjuvant chemotherapy and today is cycle 1 day 8 of [place].  She is here for toxicity evaluation.  REVIEW OF SYSTEMS:   Constitutional: Denies fevers, chills or abnormal weight loss Eyes: Denies blurriness of vision Ears, nose, mouth, throat, and face: Denies mucositis or sore throat Respiratory: Denies cough, dyspnea or wheezes Cardiovascular: Denies  palpitation, chest discomfort Gastrointestinal:  Denies nausea, heartburn or change in bowel habits Skin: Denies abnormal skin rashes Lymphatics: Denies new lymphadenopathy or easy bruising Neurological:Denies numbness, tingling or new weaknesses Behavioral/Psych: Mood is stable, no new changes  Extremities: No lower extremity edema All other systems were reviewed with the patient and are negative.  I have reviewed the past medical history, past surgical history, social history and family history with the patient and they are unchanged from previous note.  ALLERGIES:  has No Known Allergies.  MEDICATIONS:  Current Outpatient Medications  Medication Sig Dispense Refill  . betamethasone valerate ointment (VALISONE) 0.1 % Apply bid for 1-2 weeks as needed for flare of lichen sclerosis. 15 g 1  . dexamethasone (DECADRON) 4 MG tablet Take 1 tablet (4 mg total) by mouth daily. 1 tab day before chemo and 1 tab day after chemo 12 tablet 0  . ibuprofen (ADVIL,MOTRIN) 800 MG tablet Take 1 tablet (800 mg total) by mouth every 8 (eight) hours as needed. 30 tablet 0  . LORazepam (ATIVAN) 1 MG tablet Take 1 tablet (1 mg total) by mouth every 8 (eight) hours as needed for anxiety (or nausea). 30 tablet 0  . Omega-3 Fatty Acids (FISH OIL) 1000 MG CAPS Take 1 capsule by mouth daily.    . ondansetron (ZOFRAN) 8 MG tablet Take 1 tablet (8 mg total) by mouth every 8 (eight) hours as needed for nausea. 30 tablet 3  . venlafaxine XR (EFFEXOR-XR) 75 MG 24 hr capsule Take 1 capsule (75 mg total) by mouth daily. 30 capsule 0  No current facility-administered medications for this visit.     PHYSICAL EXAMINATION: ECOG PERFORMANCE STATUS: 1 - Symptomatic but completely ambulatory  There were no vitals filed for this visit. There were no vitals filed for this visit.  GENERAL:alert, no distress and comfortable SKIN: skin color, texture, turgor are normal, no rashes or significant lesions EYES: normal,  Conjunctiva are pink and non-injected, sclera clear OROPHARYNX:no exudate, no erythema and lips, buccal mucosa, and tongue normal  NECK: supple, thyroid normal size, non-tender, without nodularity LYMPH:  no palpable lymphadenopathy in the cervical, axillary or inguinal LUNGS: clear to auscultation and percussion with normal breathing effort HEART: regular rate & rhythm and no murmurs and no lower extremity edema ABDOMEN:abdomen soft, non-tender and normal bowel sounds MUSCULOSKELETAL:no cyanosis of digits and no clubbing  NEURO: alert & oriented x 3 with fluent speech, no focal motor/sensory deficits EXTREMITIES: No lower extremity edema   LABORATORY DATA:  I have reviewed the data as listed CMP Latest Ref Rng & Units 12/10/2017 12/04/2017 11/19/2017  Glucose 70 - 99 mg/dL 121(H) 89 89  BUN 6 - 20 mg/dL _0 Creatinine 0.44 - 1.00 mg/dL 0.90 0.81 0.84  Sodium 135 - 145 mmol/L 138 140 139  Potassium 3.5 - 5.1 mmol/L 3.6 3.7 3.9  Chloride 98 - 111 mmol/L 106 107 104  CO2 22 - 32 mmol/L _1 Calcium 8.9 - 10.3 mg/dL 8.8(L) 9.1 9.0  Total Protein 6.5 - 8.1 g/dL 6.6 7.1 7.3  Total Bilirubin 0.3 - 1.2 mg/dL 0.3 0.3 0.4  Alkaline Phos 38 - 126 U/L 55 55 60  AST 15 - 41 U/L 15 16 11(L)  ALT 0 - 44 U/L _2 Lab Results  Component Value Date   WBC 4.6 12/10/2017   HGB 12.0 12/10/2017   HCT 35.4 12/10/2017   MCV 89.8 12/10/2017   PLT 239 12/10/2017   NEUTROABS 2.6 12/10/2017    ASSESSMENT & PLAN:  Malignant neoplasm of upper-outer quadrant of right breast in female, estrogen receptor positive ([place]) 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%, PR 10%, Ki-67 70%, HER-2 positive, T1 CN 0 stage Ia Breast MRI 11/25/2017: 4.4 x 2.8 x 2.1 cm right breast malignancy UOQ, second focus 0.8 cm(biopsy plannedfor 12/09/2017), overall extent of disease is 7 cm 12/12/2017:Biopsy results of the MRI guided biopsies  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  Treatment plan: 1.Neoadjuvant chemotherapy with Aneta followed by Herceptin and Perjeta maintenance for 1 year 2.breast conserving surgery with sentinel lymph node biopsy 3.Radiation therapy 4.Adjuvant antiestrogen therapy -------------------------------------------------------------------------------------------------------------------------------- Current Treatment: Cycle 1 day 8 TCHP Chemo Toxicities: 1.  Diarrhea 1-2 times per day well controlled with Imodium 2. change in taste 3.  Fatigue from chemo 4.  Mild bone pain from Neulasta  Denied any nausea vomiting Blood work was reviewed RTC in 2 weeks for cycle 2    No orders of the defined types were placed in this encounter.  The patient has a good understanding of the overall plan. she agrees with it. she will call with any problems that may develop before the next visit here.   Harriette Ohara, MD 12/18/17

## 2017-12-18 NOTE — Telephone Encounter (Signed)
Per 9/19 los, no new orders or referrals.

## 2017-12-23 DIAGNOSIS — F4323 Adjustment disorder with mixed anxiety and depressed mood: Secondary | ICD-10-CM | POA: Diagnosis not present

## 2017-12-25 ENCOUNTER — Other Ambulatory Visit: Payer: Self-pay

## 2017-12-25 ENCOUNTER — Ambulatory Visit: Payer: Self-pay | Admitting: Hematology and Oncology

## 2017-12-25 ENCOUNTER — Ambulatory Visit: Payer: Self-pay

## 2017-12-30 ENCOUNTER — Telehealth: Payer: Self-pay | Admitting: *Deleted

## 2017-12-30 DIAGNOSIS — M47816 Spondylosis without myelopathy or radiculopathy, lumbar region: Secondary | ICD-10-CM | POA: Diagnosis not present

## 2017-12-30 NOTE — Telephone Encounter (Signed)
[id number] [employer] clinical trial: Called patient to remind her of 1 month research labs will be drawn at her lab appointment this Thursday.  She does not need to fast for this lab.  Next study visit will be in 2 months and we will work around her treatment schedule for that appointment.  Research nurse will contact her about that appointment when it is a little closer to that time for her preference of dates.  Patient verbalized understanding. Thanked patient for her time and participation in this study.  Foye Spurling, BSN, RN Clinical Research Nurse 12/30/2017 11:59 AM

## 2017-12-31 ENCOUNTER — Ambulatory Visit: Payer: Self-pay

## 2017-12-31 ENCOUNTER — Other Ambulatory Visit: Payer: Self-pay

## 2018-01-01 ENCOUNTER — Inpatient Hospital Stay: Payer: BLUE CROSS/BLUE SHIELD | Attending: Hematology and Oncology

## 2018-01-01 ENCOUNTER — Inpatient Hospital Stay: Payer: BLUE CROSS/BLUE SHIELD | Admitting: Hematology and Oncology

## 2018-01-01 ENCOUNTER — Inpatient Hospital Stay: Payer: BLUE CROSS/BLUE SHIELD

## 2018-01-01 DIAGNOSIS — R197 Diarrhea, unspecified: Secondary | ICD-10-CM

## 2018-01-01 DIAGNOSIS — R530 Neoplastic (malignant) related fatigue: Secondary | ICD-10-CM

## 2018-01-01 DIAGNOSIS — Z006 Encounter for examination for normal comparison and control in clinical research program: Secondary | ICD-10-CM

## 2018-01-01 DIAGNOSIS — Z170 Estrogen receptor positive status [ER+]: Secondary | ICD-10-CM | POA: Diagnosis not present

## 2018-01-01 DIAGNOSIS — D6481 Anemia due to antineoplastic chemotherapy: Secondary | ICD-10-CM | POA: Insufficient documentation

## 2018-01-01 DIAGNOSIS — C50411 Malignant neoplasm of upper-outer quadrant of right female breast: Secondary | ICD-10-CM

## 2018-01-01 DIAGNOSIS — Z95828 Presence of other vascular implants and grafts: Secondary | ICD-10-CM

## 2018-01-01 DIAGNOSIS — Z5112 Encounter for antineoplastic immunotherapy: Secondary | ICD-10-CM | POA: Insufficient documentation

## 2018-01-01 DIAGNOSIS — Z79899 Other long term (current) drug therapy: Secondary | ICD-10-CM | POA: Diagnosis not present

## 2018-01-01 DIAGNOSIS — Z5111 Encounter for antineoplastic chemotherapy: Secondary | ICD-10-CM | POA: Diagnosis not present

## 2018-01-01 DIAGNOSIS — Z5189 Encounter for other specified aftercare: Secondary | ICD-10-CM | POA: Insufficient documentation

## 2018-01-01 LAB — CMP (CANCER CENTER ONLY)
ALT: 16 U/L (ref 0–44)
AST: 14 U/L — ABNORMAL LOW (ref 15–41)
Albumin: 3.8 g/dL (ref 3.5–5.0)
Alkaline Phosphatase: 64 U/L (ref 38–126)
Anion gap: 7 (ref 5–15)
BUN: 8 mg/dL (ref 6–20)
CO2: 28 mmol/L (ref 22–32)
Calcium: 9.1 mg/dL (ref 8.9–10.3)
Chloride: 105 mmol/L (ref 98–111)
Creatinine: 0.82 mg/dL (ref 0.44–1.00)
GFR, Est AFR Am: >60 mL/min (ref >60)
GFR, Estimated: >60 mL/min (ref >60)
Glucose, Bld: 87 mg/dL (ref 70–99)
Potassium: 3.6 mmol/L (ref 3.5–5.1)
Sodium: 140 mmol/L (ref 135–145)
Total Bilirubin: 0.3 mg/dL (ref 0.3–1.2)
Total Protein: 6.7 g/dL (ref 6.5–8.1)

## 2018-01-01 LAB — CBC WITH DIFFERENTIAL (CANCER CENTER ONLY)
Basophils Absolute: 0.1 10*3/uL (ref 0.0–0.1)
Basophils Relative: 1 %
Eosinophils Absolute: 0.1 10*3/uL (ref 0.0–0.5)
Eosinophils Relative: 2 %
HCT: 35.5 % (ref 34.8–46.6)
Hemoglobin: 12.1 g/dL (ref 11.6–15.9)
Lymphocytes Relative: 33 %
Lymphs Abs: 1.4 10*3/uL (ref 0.9–3.3)
MCH: 30.1 pg (ref 25.1–34.0)
MCHC: 34.1 g/dL (ref 31.5–36.0)
MCV: 88.4 fL (ref 79.5–101.0)
Monocytes Absolute: 0.5 10*3/uL (ref 0.1–0.9)
Monocytes Relative: 12 %
Neutro Abs: 2.2 10*3/uL (ref 1.5–6.5)
Neutrophils Relative %: 52 %
Platelet Count: 263 10*3/uL (ref 145–400)
RBC: 4.02 MIL/uL (ref 3.70–5.45)
RDW: 14 % (ref 11.2–14.5)
WBC Count: 4.2 10*3/uL (ref 3.9–10.3)

## 2018-01-01 LAB — RESEARCH LABS

## 2018-01-01 MED ORDER — SODIUM CHLORIDE 0.9% FLUSH
10.0000 mL | INTRAVENOUS | Status: DC | PRN
  Administered 2018-01-01: 10 mL
  Filled 2018-01-01: qty 10

## 2018-01-01 NOTE — Assessment & Plan Note (Signed)
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  Treatment plan: 1.Neoadjuvant chemotherapy with [place] Perjetafollowed by Herceptin and Perjetamaintenance for 1 year 2.breast conserving surgery with sentinel lymph node biopsy 3.Radiation therapy 4.Adjuvant antiestrogen therapy -------------------------------------------------------------------------------------------------------------------------------- Current Treatment: Cycle 2 TCHP Chemo Toxicities: 1.  Diarrhea 1-2 times per day well controlled with Imodium 2. change in taste 3.  Fatigue from chemo 4.  Mild bone pain from Neulasta  Denied any nausea vomiting Blood work was reviewed RTC in 3 weeks for cycle 3

## 2018-01-01 NOTE — Progress Notes (Signed)
Patient Care Team: Nunzio Cobbs, MD as PCP - General (Obstetrics and Gynecology) Macario Carls, MD as Referring Physician (Specialist) Erroll Luna, MD as Consulting Physician (General Surgery) Nicholas Lose, MD as Consulting Physician (Hematology and Oncology) Kyung Rudd, MD as Consulting Physician (Radiation Oncology)  DIAGNOSIS:  Encounter Diagnosis  Name Primary?  . Malignant neoplasm of upper-outer quadrant of right breast in female, estrogen receptor positive ([place])     SUMMARY OF ONCOLOGIC HISTORY:   Malignant neoplasm of upper-outer quadrant of right breast in female, estrogen receptor positive ([place])   11/11/2017 Initial Diagnosis    Palpable right breast mass at 10 o'clock position 1.7 cm, axilla negative, biopsy revealed grade 3 IDC ER 60%, PR 10%, Ki-67 70%, HER-2 positive, T1 CN 0 stage Ia    11/19/2017 Cancer Staging    Staging form: Breast, AJCC 8th Edition - Clinical: Stage IB (cT2, cN0, cM0, G3, ER+, PR+, HER2+) - Signed by Nicholas Lose, MD on 12/04/2017    11/25/2017 Breast MRI    4.4 x 2.8 x 2.1 cm right breast malignancy UOQ, second focus 0.8 cm (biopsy planned for 12/09/2017)    12/04/2017 -  Neo-Adjuvant Chemotherapy    Initial plan was with Taxol Herceptin but it was switched to [facility] Perjeta every 3 week x6 followed by Herceptin plus/minus Perjeta maintenance when the breast MRI showed more extensive disease.     CHIEF COMPLIANT: Tomorrow is cycle 2 TCH Perjeta  INTERVAL HISTORY: Sandra [name] is a 52 year old with above-mentioned history of HER-2 positive breast cancer currently neoadjuvant chemotherapy and tomorrow is cycle 2 of [place] Perjeta.  She had fatigue that lasted into the middle of last week and but this past week she has been feeling much better.  She has had intermittent loose stools up to 3 a day and she knows that if she took 2 Imodium as it would stop the diarrhea but she is only taking 1 because she did not want to take too many  medications.  She did not have any nausea vomiting.  Did have fatigue.  REVIEW OF SYSTEMS:   Constitutional: Denies fevers, chills or abnormal weight loss Eyes: Denies blurriness of vision Ears, nose, mouth, throat, and face: Denies mucositis or sore throat Respiratory: Denies cough, dyspnea or wheezes Cardiovascular: Denies palpitation, chest discomfort Gastrointestinal:  Denies nausea, heartburn or change in bowel habits Skin: Denies abnormal skin rashes Lymphatics: Denies new lymphadenopathy or easy bruising Neurological:Denies numbness, tingling or new weaknesses Behavioral/Psych: Mood is stable, no new changes  Extremities: No lower extremity edema   All other systems were reviewed with the patient and are negative.  I have reviewed the past medical history, past surgical history, social history and family history with the patient and they are unchanged from previous note.  ALLERGIES:  has No Known Allergies.  MEDICATIONS:  Current Outpatient Medications  Medication Sig Dispense Refill  . betamethasone valerate ointment (VALISONE) 0.1 % Apply bid for 1-2 weeks as needed for flare of lichen sclerosis. 15 g 1  . dexamethasone (DECADRON) 4 MG tablet Take 1 tablet (4 mg total) by mouth daily. 1 tab day before chemo and 1 tab day after chemo 12 tablet 0  . ibuprofen (ADVIL,MOTRIN) 800 MG tablet Take 1 tablet (800 mg total) by mouth every 8 (eight) hours as needed. 30 tablet 0  . LORazepam (ATIVAN) 1 MG tablet Take 1 tablet (1 mg total) by mouth every 8 (eight) hours as needed for anxiety (or nausea). [place]  tablet 0  . Omega-3 Fatty Acids (FISH OIL) 1000 MG CAPS Take 1 capsule by mouth daily.    . ondansetron (ZOFRAN) 8 MG tablet Take 1 tablet (8 mg total) by mouth every 8 (eight) hours as needed for nausea. 30 tablet 3  . venlafaxine XR (EFFEXOR-XR) 75 MG 24 hr capsule Take 1 capsule (75 mg total) by mouth daily. 30 capsule 0   No current facility-administered medications for this visit.      PHYSICAL EXAMINATION: ECOG PERFORMANCE STATUS: 1 - Symptomatic but completely ambulatory  Vitals:   01/01/18 0948  BP: 135/73  Pulse: 75  Resp: 17  Temp: 98.6 F (37 C)  SpO2: 99%   Filed Weights   01/01/18 0948  Weight: 208 lb 12.8 oz (94.7 kg)    GENERAL:alert, no distress and comfortable SKIN: skin color, texture, turgor are normal, no rashes or significant lesions EYES: normal, Conjunctiva are pink and non-injected, sclera clear OROPHARYNX:no exudate, no erythema and lips, buccal mucosa, and tongue normal  NECK: supple, thyroid normal size, non-tender, without nodularity LYMPH:  no palpable lymphadenopathy in the cervical, axillary or inguinal LUNGS: clear to auscultation and percussion with normal breathing effort HEART: regular rate & rhythm and no murmurs and no lower extremity edema ABDOMEN:abdomen soft, non-tender and normal bowel sounds MUSCULOSKELETAL:no cyanosis of digits and no clubbing  NEURO: alert & oriented x 3 with fluent speech, no focal motor/sensory deficits EXTREMITIES: No lower extremity edema    LABORATORY DATA:  I have reviewed the data as listed CMP Latest Ref Rng & Units 12/18/2017 12/10/2017 12/04/2017  Glucose 70 - 99 mg/dL 93 121(H) 89  BUN 6 - 20 mg/dL _0 Creatinine 0.44 - 1.00 mg/dL 0.99 0.90 0.81  Sodium 135 - 145 mmol/L 140 138 140  Potassium 3.5 - 5.1 mmol/L 4.4 3.6 3.7  Chloride 98 - 111 mmol/L 105 106 107  CO2 22 - 32 mmol/L _1 Calcium 8.9 - 10.3 mg/dL 9.3 8.8(L) 9.1  Total Protein 6.5 - 8.1 g/dL 7.0 6.6 7.1  Total Bilirubin 0.3 - 1.2 mg/dL 0.4 0.3 0.3  Alkaline Phos 38 - 126 U/L 71 55 55  AST 15 - 41 U/L _2 ALT 0 - 44 U/L _3 Lab Results  Component Value Date   WBC 4.2 01/01/2018   HGB 12.1 01/01/2018   HCT 35.5 01/01/2018   MCV 88.4 01/01/2018   PLT 263 01/01/2018   NEUTROABS 2.2 01/01/2018    ASSESSMENT & PLAN:  Malignant neoplasm of upper-outer quadrant of right breast in female,  estrogen receptor positive ([place]) 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  Treatment plan: 1.Neoadjuvant chemotherapy with [place] Perjetafollowed by Herceptin and Perjetamaintenance for 1 year 2.breast conserving surgery with sentinel lymph node biopsy 3.Radiation therapy 4.Adjuvant antiestrogen therapy -------------------------------------------------------------------------------------------------------------------------------- Current Treatment: Cycle 2 TCHP (being given tomorrow) Chemo Toxicities: 1.  Diarrhea 1-2 times per day well controlled with Imodium 2. change in taste 3.  Fatigue from chemo 4.  Mild bone pain from Neulasta  Denied any nausea vomiting Blood work was reviewed RTC  in 3 weeks for cycle 3    No orders of the defined types were placed in this encounter.  The patient has a good understanding of the overall plan. she agrees with it. she will call with any problems that may develop before the next visit here.   Harriette Ohara, MD 01/01/18

## 2018-01-02 ENCOUNTER — Inpatient Hospital Stay: Payer: BLUE CROSS/BLUE SHIELD

## 2018-01-02 ENCOUNTER — Encounter: Payer: Self-pay | Admitting: *Deleted

## 2018-01-02 VITALS — BP 126/70 | HR 87 | Temp 98.5°F | Resp 20

## 2018-01-02 DIAGNOSIS — C50411 Malignant neoplasm of upper-outer quadrant of right female breast: Secondary | ICD-10-CM | POA: Diagnosis not present

## 2018-01-02 DIAGNOSIS — R530 Neoplastic (malignant) related fatigue: Secondary | ICD-10-CM | POA: Diagnosis not present

## 2018-01-02 DIAGNOSIS — Z5189 Encounter for other specified aftercare: Secondary | ICD-10-CM | POA: Diagnosis not present

## 2018-01-02 DIAGNOSIS — Z170 Estrogen receptor positive status [ER+]: Secondary | ICD-10-CM

## 2018-01-02 DIAGNOSIS — D6481 Anemia due to antineoplastic chemotherapy: Secondary | ICD-10-CM | POA: Diagnosis not present

## 2018-01-02 DIAGNOSIS — Z79899 Other long term (current) drug therapy: Secondary | ICD-10-CM | POA: Diagnosis not present

## 2018-01-02 DIAGNOSIS — Z5112 Encounter for antineoplastic immunotherapy: Secondary | ICD-10-CM | POA: Diagnosis not present

## 2018-01-02 DIAGNOSIS — Z5111 Encounter for antineoplastic chemotherapy: Secondary | ICD-10-CM | POA: Diagnosis not present

## 2018-01-02 MED ORDER — TRASTUZUMAB CHEMO 150 MG IV SOLR
600.0000 mg | Freq: Once | INTRAVENOUS | Status: AC
  Administered 2018-01-02: 600 mg via INTRAVENOUS
  Filled 2018-01-02: qty 28.57

## 2018-01-02 MED ORDER — SODIUM CHLORIDE 0.9 % IV SOLN
75.0000 mg/m2 | Freq: Once | INTRAVENOUS | Status: AC
  Administered 2018-01-02: 170 mg via INTRAVENOUS
  Filled 2018-01-02: qty 17

## 2018-01-02 MED ORDER — PALONOSETRON HCL INJECTION 0.25 MG/5ML
INTRAVENOUS | Status: AC
  Filled 2018-01-02: qty 5

## 2018-01-02 MED ORDER — SODIUM CHLORIDE 0.9% FLUSH
10.0000 mL | INTRAVENOUS | Status: DC | PRN
  Administered 2018-01-02: 10 mL
  Filled 2018-01-02: qty 10

## 2018-01-02 MED ORDER — ACETAMINOPHEN 325 MG PO TABS: 650.0000 mg | ORAL_TABLET | Freq: Once | ORAL | Status: DC

## 2018-01-02 MED ORDER — SODIUM CHLORIDE 0.9 % IV SOLN
420.0000 mg | Freq: Once | INTRAVENOUS | Status: AC
  Administered 2018-01-02: 420 mg via INTRAVENOUS
  Filled 2018-01-02: qty 14

## 2018-01-02 MED ORDER — HEPARIN SOD (PORK) LOCK FLUSH 100 UNIT/ML IV SOLN
500.0000 [IU] | Freq: Once | INTRAVENOUS | Status: AC | PRN
  Administered 2018-01-02: 500 [IU]
  Filled 2018-01-02: qty 5

## 2018-01-02 MED ORDER — SODIUM CHLORIDE 0.9 % IV SOLN
Freq: Once | INTRAVENOUS | Status: AC
  Administered 2018-01-02: 09:00:00 via INTRAVENOUS
  Filled 2018-01-02: qty 5

## 2018-01-02 MED ORDER — SODIUM CHLORIDE 0.9 % IV SOLN
Freq: Once | INTRAVENOUS | Status: AC
  Administered 2018-01-02: 09:00:00 via INTRAVENOUS
  Filled 2018-01-02: qty 250

## 2018-01-02 MED ORDER — DIPHENHYDRAMINE HCL 25 MG PO CAPS: 50.0000 mg | ORAL_CAPSULE | Freq: Once | ORAL | Status: DC

## 2018-01-02 MED ORDER — PALONOSETRON HCL INJECTION 0.25 MG/5ML
0.2500 mg | Freq: Once | INTRAVENOUS | Status: AC
  Administered 2018-01-02: 0.25 mg via INTRAVENOUS

## 2018-01-02 MED ORDER — SODIUM CHLORIDE 0.9 % IV SOLN
700.0000 mg | Freq: Once | INTRAVENOUS | Status: AC
  Administered 2018-01-02: 700 mg via INTRAVENOUS
  Filled 2018-01-02: qty 70

## 2018-01-02 NOTE — Patient Instructions (Signed)
[facility] Discharge Instructions for Patients Receiving Chemotherapy  Today you received the following chemotherapy agents: Herceptin, Perjeta, Taxotere and Carboplatin.  To help prevent nausea and vomiting after your treatment, we encourage you to take your nausea medication as prescribed.  If you develop nausea and vomiting that is not controlled by your nausea medication, call the clinic.   BELOW ARE SYMPTOMS THAT SHOULD BE REPORTED IMMEDIATELY:  *FEVER GREATER THAN 100.5 F  *CHILLS WITH OR WITHOUT FEVER  NAUSEA AND VOMITING THAT IS NOT CONTROLLED WITH YOUR NAUSEA MEDICATION  *UNUSUAL SHORTNESS OF BREATH  *UNUSUAL BRUISING OR BLEEDING  TENDERNESS IN MOUTH AND THROAT WITH OR WITHOUT PRESENCE OF ULCERS  *URINARY PROBLEMS  *BOWEL PROBLEMS  UNUSUAL RASH Items with * indicate a potential emergency and should be followed up as soon as possible.  Feel free to call the clinic should you have any questions or concerns. The clinic phone number is [phone].  Please show the CHEMO ALERT CARD at check-in to the Emergency Department and triage nurse.  

## 2018-01-02 NOTE — Progress Notes (Signed)
Pt states she is confident she is not pregnant, but willing to do urine test if we need it. Don't need per pharmacy at this time; informed patient to let us know if she ever feels she may be pregnant and we can do a test here. Pt agreeable.

## 2018-01-02 NOTE — Progress Notes (Signed)
Per pt "0% chance of pregnancy" & confident that if a urine preg test were done today it would be negative.  She is not currently using any contraceptive. Advised pt that if she wanted preg testing here at any point, we would be able to do that for her before txs.  Currently no orders from MD.  Kennith Center, Pharm.D., CPP 01/02/2018@9 :18 AM

## 2018-01-05 ENCOUNTER — Encounter: Payer: Self-pay | Admitting: Genetic Counselor

## 2018-01-05 ENCOUNTER — Inpatient Hospital Stay: Payer: BLUE CROSS/BLUE SHIELD

## 2018-01-05 DIAGNOSIS — Z79899 Other long term (current) drug therapy: Secondary | ICD-10-CM | POA: Diagnosis not present

## 2018-01-05 DIAGNOSIS — Z170 Estrogen receptor positive status [ER+]: Secondary | ICD-10-CM

## 2018-01-05 DIAGNOSIS — R530 Neoplastic (malignant) related fatigue: Secondary | ICD-10-CM | POA: Diagnosis not present

## 2018-01-05 DIAGNOSIS — Z1379 Encounter for other screening for genetic and chromosomal anomalies: Secondary | ICD-10-CM | POA: Insufficient documentation

## 2018-01-05 DIAGNOSIS — Z5112 Encounter for antineoplastic immunotherapy: Secondary | ICD-10-CM | POA: Diagnosis not present

## 2018-01-05 DIAGNOSIS — C50411 Malignant neoplasm of upper-outer quadrant of right female breast: Secondary | ICD-10-CM

## 2018-01-05 DIAGNOSIS — Z5111 Encounter for antineoplastic chemotherapy: Secondary | ICD-10-CM | POA: Diagnosis not present

## 2018-01-05 DIAGNOSIS — Z5189 Encounter for other specified aftercare: Secondary | ICD-10-CM | POA: Diagnosis not present

## 2018-01-05 DIAGNOSIS — D6481 Anemia due to antineoplastic chemotherapy: Secondary | ICD-10-CM | POA: Diagnosis not present

## 2018-01-05 MED ORDER — PEGFILGRASTIM-CBQV 6 MG/0.6ML ~~LOC~~ SOSY
6.0000 mg | PREFILLED_SYRINGE | Freq: Once | SUBCUTANEOUS | Status: AC
  Administered 2018-01-05: 6 mg via SUBCUTANEOUS

## 2018-01-06 ENCOUNTER — Telehealth: Payer: Self-pay | Admitting: Genetic Counselor

## 2018-01-06 ENCOUNTER — Ambulatory Visit: Payer: Self-pay | Admitting: Genetic Counselor

## 2018-01-06 DIAGNOSIS — Z1379 Encounter for other screening for genetic and chromosomal anomalies: Secondary | ICD-10-CM

## 2018-01-06 NOTE — Telephone Encounter (Signed)
Revealed negative genetic testing.  Discussed that we do not know why she has breast cancer or why there is cancer in the family. It could be due to a different gene that we are not testing, or maybe our current technology may not be able to pick something up.  It will be important for her to keep in contact with genetics to keep up with whether additional testing may be needed. 

## 2018-01-06 NOTE — Progress Notes (Signed)
HPI:  Sandra [name] was previously seen in the [facility] due to a personal and family history of cancer and concerns regarding a hereditary predisposition to cancer. Please refer to our prior cancer genetics clinic note for more information regarding Sandra [name]'s medical, social and family histories, and our assessment and recommendations, at the time. Sandra [name] recent genetic test results were disclosed to her, as were recommendations warranted by these results. These results and recommendations are discussed in more detail below.  CANCER HISTORY:    Malignant neoplasm of upper-outer quadrant of right breast in female, estrogen receptor positive ([place])   11/11/2017 Initial Diagnosis    Palpable right breast mass at 10 o'clock position 1.7 cm, axilla negative, biopsy revealed grade 3 IDC ER 60%, PR 10%, Ki-67 70%, HER-2 positive, T1 CN 0 stage Ia    11/19/2017 Cancer Staging    Staging form: Breast, AJCC 8th Edition - Clinical: Stage IB (cT2, cN0, cM0, G3, ER+, PR+, HER2+) - Signed by Nicholas Lose, MD on 12/04/2017    11/25/2017 Breast MRI    4.4 x 2.8 x 2.1 cm right breast malignancy UOQ, second focus 0.8 cm (biopsy planned for 12/09/2017)    12/04/2017 -  Neo-Adjuvant Chemotherapy    Initial plan was with Taxol Herceptin but it was switched to [facility] Perjeta every 3 week x6 followed by Herceptin plus/minus Perjeta maintenance when the breast MRI showed more extensive disease.    01/04/2018 Genetic Testing    Negative genetic testing on the multi-cancer panel.  The Multi-Gene Panel offered by [facility] includes sequencing and/or deletion duplication testing of the following 84 genes: AIP, ALK, APC, ATM, AXIN2,BAP1,  BARD1, BLM, BMPR1A, BRCA1, BRCA2, BRIP1, CASR, CDC73, CDH1, CDK4, CDKN1B, CDKN1C, CDKN2A (p14ARF), CDKN2A (p16INK4a), CEBPA, CHEK2, CTNNA1, DICER1, DIS3L2, EGFR (c.2369C>T, p.Thr790Met variant only), EPCAM (Deletion/duplication testing only), FH, FLCN, GATA2, GPC3, GREM1  (Promoter region deletion/duplication testing only), HOXB13 (c.251G>A, p.Gly84Glu), HRAS, KIT, MAX, MEN1, MET, MITF (c.952G>A, p.Glu318Lys variant only), MLH1, MSH2, MSH3, MSH6, MUTYH, NBN, NF1, NF2, NTHL1, PALB2, PDGFRA, PHOX2B, PMS2, POLD1, POLE, POT1, PRKAR1A, PTCH1, PTEN, RAD50, RAD51C, RAD51D, RB1, RECQL4, RET, RUNX1, SDHAF2, SDHA (sequence changes only), SDHB, SDHC, SDHD, SMAD4, SMARCA4, SMARCB1, SMARCE1, STK11, SUFU, TERC, TERT, TMEM127, TP53, TSC1, TSC2, VHL, WRN and WT1.  The report date is January 04, 2018.     FAMILY HISTORY:  We obtained a detailed, 4-generation family history.  Significant diagnoses are listed below: Family History  Problem Relation Age of Onset  . Stroke Mother   . Heart disease Mother   . Colon cancer Father   . Prostate cancer Father   . Hypertension Brother   . Cancer Maternal Uncle        NOS  . Breast cancer Paternal Aunt        dx in her 59s  . Prostate cancer Paternal Uncle   . Alcohol abuse Maternal Grandfather   . Breast cancer Paternal Grandmother        dx in her 64s  . Prostate cancer Paternal Grandfather   . SIDS Brother   . Cancer Paternal Aunt   . Tuberculosis Paternal Uncle     The patient has two daughters who are cancer free.  She has five living brothers who are cancer free, and one brother who died of SIDS. The patient's mother is deceased and her father is living.  The patient's mother died of heart disease at 67.  She had one sister and four brothers. One brother had  lung cancer and the other had an unknown form of cancer.  The patient's maternal grandparents are deceased from non cancer related issues.  The patient's father had both colon and prostate cancer. He had three sisters and two brothers.  One sister had breast cancer, one had an unknown cancer, and one brother had prostate cancer.  The patient's paternal grandmother had breast cancer and the grandfather had prostate cancer.  Sandra [name] is unaware of previous family  history of genetic testing for hereditary cancer risks. Patient's maternal ancestors are of [place] descent, and paternal ancestors are of [place] descent. There is no reported Ashkenazi Jewish ancestry. There is no known consanguinity.  GENETIC TEST RESULTS: Genetic testing reported out on January 04, 2018 through the Multi-cancer panel found no deleterious mutations.  The Multi-Gene Panel offered by [facility] includes sequencing and/or deletion duplication testing of the following 84 genes: AIP, ALK, APC, ATM, AXIN2,BAP1,  BARD1, BLM, BMPR1A, BRCA1, BRCA2, BRIP1, CASR, CDC73, CDH1, CDK4, CDKN1B, CDKN1C, CDKN2A (p14ARF), CDKN2A (p16INK4a), CEBPA, CHEK2, CTNNA1, DICER1, DIS3L2, EGFR (c.2369C>T, p.Thr790Met variant only), EPCAM (Deletion/duplication testing only), FH, FLCN, GATA2, GPC3, GREM1 (Promoter region deletion/duplication testing only), HOXB13 (c.251G>A, p.Gly84Glu), HRAS, KIT, MAX, MEN1, MET, MITF (c.952G>A, p.Glu318Lys variant only), MLH1, MSH2, MSH3, MSH6, MUTYH, NBN, NF1, NF2, NTHL1, PALB2, PDGFRA, PHOX2B, PMS2, POLD1, POLE, POT1, PRKAR1A, PTCH1, PTEN, RAD50, RAD51C, RAD51D, RB1, RECQL4, RET, RUNX1, SDHAF2, SDHA (sequence changes only), SDHB, SDHC, SDHD, SMAD4, SMARCA4, SMARCB1, SMARCE1, STK11, SUFU, TERC, TERT, TMEM127, TP53, TSC1, TSC2, VHL, WRN and WT1.   The test report has been scanned into EPIC and is located under the Molecular Pathology section of the Results Review tab.    We discussed with Sandra [name] that since the current genetic testing is not perfect, it is possible there may be a gene mutation in one of these genes that current testing cannot detect, but that chance is small.  We also discussed, that it is possible that another gene that has not yet been discovered, or that we have not yet tested, is responsible for the cancer diagnoses in the family, and it is, therefore, important to remain in touch with cancer genetics in the future so that we can continue to offer Sandra [name] the most up  to date genetic testing.   CANCER SCREENING RECOMMENDATIONS: This result is reassuring and indicates that Sandra [name] likely does not have an increased risk for a future cancer due to a mutation in one of these genes. This normal test also suggests that Sandra [name]'s cancer was most likely not due to an inherited predisposition associated with one of these genes.  Most cancers happen by chance and this negative test suggests that her cancer falls into this category.  We, therefore, recommended she continue to follow the cancer management and screening guidelines provided by her oncology and primary healthcare provider.   An individual's cancer risk and medical management are not determined by genetic test results alone. Overall cancer risk assessment incorporates additional factors, including personal medical history, family history, and any available genetic information that may result in a personalized plan for cancer prevention and surveillance.  RECOMMENDATIONS FOR FAMILY MEMBERS:  Women in this family might be at some increased risk of developing cancer, over the general population risk, simply due to the family history of cancer.  We recommended women in this family have a yearly mammogram beginning at age 48, or 14 years younger than the earliest onset of cancer, an annual clinical breast exam, and perform monthly  breast self-exams. Women in this family should also have a gynecological exam as recommended by their primary provider. All family members should have a colonoscopy by age 41.  FOLLOW-UP: Lastly, we discussed with Sandra [name] that cancer genetics is a rapidly advancing field and it is possible that new genetic tests will be appropriate for her and/or her family members in the future. We encouraged her to remain in contact with cancer genetics on an annual basis so we can update her personal and family histories and let her know of advances in cancer genetics that may benefit this family.    Our contact number was provided. Sandra [name] questions were answered to her satisfaction, and she knows she is welcome to call us at anytime with additional questions or concerns.   Sandra Kayser, MS, [facility] Certified Genetic Counselor [email or web address]

## 2018-01-07 ENCOUNTER — Ambulatory Visit: Payer: Self-pay

## 2018-01-07 ENCOUNTER — Other Ambulatory Visit: Payer: Self-pay

## 2018-01-07 ENCOUNTER — Ambulatory Visit: Payer: Self-pay | Admitting: Hematology and Oncology

## 2018-01-14 ENCOUNTER — Ambulatory Visit: Payer: BLUE CROSS/BLUE SHIELD | Admitting: Obstetrics and Gynecology

## 2018-01-15 ENCOUNTER — Ambulatory Visit: Payer: Self-pay

## 2018-01-15 ENCOUNTER — Other Ambulatory Visit: Payer: Self-pay

## 2018-01-19 ENCOUNTER — Other Ambulatory Visit (HOSPITAL_COMMUNITY)
Admission: RE | Admit: 2018-01-19 | Discharge: 2018-01-19 | Disposition: A | Discharge status: Home / Self Care | Payer: BLUE CROSS/BLUE SHIELD | Source: Ambulatory Visit | Attending: Obstetrics and Gynecology | Admitting: Obstetrics and Gynecology

## 2018-01-19 ENCOUNTER — Ambulatory Visit: Payer: BLUE CROSS/BLUE SHIELD | Admitting: Obstetrics and Gynecology

## 2018-01-19 ENCOUNTER — Telehealth: Payer: Self-pay

## 2018-01-19 ENCOUNTER — Other Ambulatory Visit: Payer: Self-pay

## 2018-01-19 ENCOUNTER — Encounter: Payer: Self-pay | Admitting: Obstetrics and Gynecology

## 2018-01-19 VITALS — BP 130/70 | HR 88 | Resp 14 | Ht 70.5 in | Wt 207.0 lb

## 2018-01-19 DIAGNOSIS — Z01419 Encounter for gynecological examination (general) (routine) without abnormal findings: Secondary | ICD-10-CM | POA: Diagnosis not present

## 2018-01-19 MED ORDER — VENLAFAXINE HCL ER 75 MG PO CP24: 75.0000 mg | ORAL_CAPSULE | Freq: Every day | ORAL | 3 refills | Status: DC

## 2018-01-19 NOTE — Telephone Encounter (Signed)
Received fax from [employer] with prior authorization approval for genetic testing for dates of 11/2017-12/2017. Information forwarded to genetics.

## 2018-01-19 NOTE — Progress Notes (Signed)
52 y.o. G59P2002 Married Caucasian female here for annual exam.    Started treatment for right breast cancer.  Doing chemotherapy. Last cycle will be Dec. 26.  Expecting lumpectomy following this.   Had negative genetic testing.   No menses.  No interest in intimacy.   PCP: Briscoe Deutscher, DO    No LMP recorded. (Menstrual status: Chemotherapy).     Period Cycle (Days): (no cycles since Mirena IUD removed and pt. now on Chemo)     Sexually active: Yes.    The current method of family planning is none.    Exercising: Yes.    walks 3x/week. Smoker:  no  Health Maintenance: Pap:  11-18-14 Neg:Neg HR HPV, 10-24-11 Neg:Neg HR HPV History of abnormal Pap:  no MMG: 11/2017 DXd w/Grade III Invasive ductal CA--SEE Epic Colonoscopy:  01/2016 normal BMD:   n/a  Result  n/a TDaP:  12-07-15 Gardasil:   no HIV: neg in preg Hep C: Unsure Screening Labs:  Hb today: Oncology   reports that she has never smoked. She has never used smokeless tobacco. She reports that she does not drink alcohol or use drugs.  Past Medical History:  Diagnosis Date  . Breast cancer ([place]) 11/2017   rt.Br.  . Depression   . Endometriosis, diagnosis via laparoscopy   . Family history of breast cancer   . Family history of breast cancer   . Family history of colon cancer   . Family history of prostate cancer   . Hyperlipidemia 2017  . Lichen sclerosus, vulva   . Low serum vitamin D 2017    Past Surgical History:  Procedure Laterality Date  . APPENDECTOMY  2005  . CESAREAN SECTION  2002  . MANDIBLE RECONSTRUCTION  1985   wires inside jaw according to pt  . PELVIC LAPAROSCOPY  2004  . PORTACATH PLACEMENT Right 12/03/2017   Procedure: INSERTION PORT-A-CATH;  Surgeon: Erroll Luna, MD;  Location: [facility];  Service: General;  Laterality: Right;  . [facility] SURGERY  1998    Current Outpatient Medications  Medication Sig Dispense Refill  . betamethasone valerate ointment (VALISONE) 0.1 % Apply  bid for 1-2 weeks as needed for flare of lichen sclerosis. 15 g 1  . dexamethasone (DECADRON) 4 MG tablet Take 1 tablet (4 mg total) by mouth daily. 1 tab day before chemo and 1 tab day after chemo 12 tablet 0  . ibuprofen (ADVIL,MOTRIN) 800 MG tablet Take 1 tablet (800 mg total) by mouth every 8 (eight) hours as needed. 30 tablet 0  . lidocaine-prilocaine (EMLA) cream   5  . LORazepam (ATIVAN) 1 MG tablet Take 1 tablet (1 mg total) by mouth every 8 (eight) hours as needed for anxiety (or nausea). 30 tablet 0  . Omega-3 Fatty Acids (FISH OIL) 1000 MG CAPS Take 1 capsule by mouth daily.    . ondansetron (ZOFRAN) 8 MG tablet Take 1 tablet (8 mg total) by mouth every 8 (eight) hours as needed for nausea. 30 tablet 3  . venlafaxine XR (EFFEXOR-XR) 75 MG 24 hr capsule Take 1 capsule (75 mg total) by mouth daily. 30 capsule 0   No current facility-administered medications for this visit.     Family History  Problem Relation Age of Onset  . Stroke Mother   . Heart disease Mother   . Colon cancer Father   . Prostate cancer Father   . Hypertension Brother   . Cancer Maternal Uncle  NOS  . Breast cancer Paternal Aunt        dx in her 58s  . Prostate cancer Paternal Uncle   . Alcohol abuse Maternal Grandfather   . Breast cancer Paternal Grandmother        dx in her 10s  . Prostate cancer Paternal Grandfather   . SIDS Brother   . Cancer Paternal Aunt   . Tuberculosis Paternal Uncle     Review of Systems  All other systems reviewed and are negative.   Exam:   BP 130/70 (BP Location: Right Arm, Patient Position: Sitting, Cuff Size: Large)   Pulse 88   Resp 14   Ht 5' 10.5" (1.791 m)   Wt 207 lb (93.9 kg)   BMI 29.28 kg/m     General appearance: alert, cooperative and appears stated age Head: Normocephalic, without obvious abnormality, atraumatic Neck: no adenopathy, supple, symmetrical, trachea midline and thyroid normal to inspection and palpation Lungs: clear to auscultation  bilaterally Breasts: right - Port-A-Cath present right chest wall.  Right breast with scars and retraction consistent with prior biopsies.  Firmness under bx sites.  No nipple retraction or dimpling, No nipple discharge or bleeding, No axillary or supraclavicular adenopathy. Left breast - normal appearance, no masses or tenderness, No nipple retraction or dimpling, No nipple discharge or bleeding, No axillary or supraclavicular adenopathy Heart: regular rate and rhythm Abdomen: soft, non-tender; no masses, no organomegaly Extremities: extremities normal, atraumatic, no cyanosis or edema Skin: Skin color, texture, turgor normal. No rashes or lesions Lymph nodes: Cervical, supraclavicular, and axillary nodes normal. No abnormal inguinal nodes palpated Neurologic: Grossly normal  Pelvic: External genitalia:   Hypopigmentation noted.              Urethra:  normal appearing urethra with no masses, tenderness or lesions              Bartholins and Skenes: normal                 Vagina: normal appearing vagina with normal color and discharge, no lesions              Cervix: no lesions              Pap taken: Yes.   Bimanual Exam:  Uterus:  normal size, contour, position, consistency, mobility, non-tender              Adnexa: no mass, fullness, tenderness              Rectal exam: Yes.  .  Confirms.              Anus:  normal sphincter tone, no lesions.  Hypopigmentation of perianal region.   Chaperone was present for exam.  Assessment:   Well woman visit with normal exam. Right breast cancer. Status post Mirena IUD removal. Lichen sclerosus.  FH colon cancer.  Low vit D. Hx elevated LDL cholesterol. Depression treated with Effexor.  Hx decreased libido and orgasm response.   Plan: Mammogram screening. Recommended self breast awareness. Pap and HR HPV as above. Guidelines for Calcium, Vitamin D, regular exercise program including cardiovascular and weight bearing exercise. Effexor  refill for one year.  She will contact me if she would like to increase the dosage.  Support given for new dx of breast cancer.  She will check with her oncology team regarding flu vaccine.  Follow up annually and prn.   After visit summary provided.

## 2018-01-19 NOTE — Patient Instructions (Signed)

## 2018-01-20 DIAGNOSIS — F4323 Adjustment disorder with mixed anxiety and depressed mood: Secondary | ICD-10-CM | POA: Diagnosis not present

## 2018-01-21 LAB — CYTOLOGY - PAP
Diagnosis: NEGATIVE
HPV: NOT DETECTED

## 2018-01-22 ENCOUNTER — Encounter: Payer: Self-pay | Admitting: *Deleted

## 2018-01-22 ENCOUNTER — Inpatient Hospital Stay: Payer: BLUE CROSS/BLUE SHIELD

## 2018-01-22 ENCOUNTER — Other Ambulatory Visit: Payer: Self-pay | Admitting: Hematology and Oncology

## 2018-01-22 ENCOUNTER — Inpatient Hospital Stay (HOSPITAL_BASED_OUTPATIENT_CLINIC_OR_DEPARTMENT_OTHER): Payer: BLUE CROSS/BLUE SHIELD | Admitting: Hematology and Oncology

## 2018-01-22 ENCOUNTER — Ambulatory Visit: Payer: Self-pay

## 2018-01-22 ENCOUNTER — Encounter: Payer: Self-pay | Admitting: General Practice

## 2018-01-22 DIAGNOSIS — Z170 Estrogen receptor positive status [ER+]: Secondary | ICD-10-CM

## 2018-01-22 DIAGNOSIS — R197 Diarrhea, unspecified: Secondary | ICD-10-CM

## 2018-01-22 DIAGNOSIS — R530 Neoplastic (malignant) related fatigue: Secondary | ICD-10-CM

## 2018-01-22 DIAGNOSIS — Z006 Encounter for examination for normal comparison and control in clinical research program: Secondary | ICD-10-CM

## 2018-01-22 DIAGNOSIS — C50411 Malignant neoplasm of upper-outer quadrant of right female breast: Secondary | ICD-10-CM | POA: Diagnosis not present

## 2018-01-22 DIAGNOSIS — Z5111 Encounter for antineoplastic chemotherapy: Secondary | ICD-10-CM | POA: Diagnosis not present

## 2018-01-22 DIAGNOSIS — D6481 Anemia due to antineoplastic chemotherapy: Secondary | ICD-10-CM

## 2018-01-22 DIAGNOSIS — Z95828 Presence of other vascular implants and grafts: Secondary | ICD-10-CM

## 2018-01-22 DIAGNOSIS — Z5112 Encounter for antineoplastic immunotherapy: Secondary | ICD-10-CM | POA: Diagnosis not present

## 2018-01-22 DIAGNOSIS — R110 Nausea: Secondary | ICD-10-CM

## 2018-01-22 DIAGNOSIS — Z79899 Other long term (current) drug therapy: Secondary | ICD-10-CM | POA: Diagnosis not present

## 2018-01-22 DIAGNOSIS — Z5189 Encounter for other specified aftercare: Secondary | ICD-10-CM | POA: Diagnosis not present

## 2018-01-22 LAB — CBC WITH DIFFERENTIAL (CANCER CENTER ONLY)
Abs Immature Granulocytes: 0.02 10*3/uL (ref 0.00–0.07)
Basophils Absolute: 0 10*3/uL (ref 0.0–0.1)
Basophils Relative: 0 %
Eosinophils Absolute: 0 10*3/uL (ref 0.0–0.5)
Eosinophils Relative: 0 %
HCT: 32 % — ABNORMAL LOW (ref 36.0–46.0)
Hemoglobin: 10.6 g/dL — ABNORMAL LOW (ref 12.0–15.0)
Immature Granulocytes: 0 %
Lymphocytes Relative: 30 %
Lymphs Abs: 1.7 10*3/uL (ref 0.7–4.0)
MCH: 30 pg (ref 26.0–34.0)
MCHC: 33.1 g/dL (ref 30.0–36.0)
MCV: 90.7 fL (ref 80.0–100.0)
Monocytes Absolute: 0.7 10*3/uL (ref 0.1–1.0)
Monocytes Relative: 13 %
Neutro Abs: 3.3 10*3/uL (ref 1.7–7.7)
Neutrophils Relative %: 57 %
Platelet Count: 242 10*3/uL (ref 150–400)
RBC: 3.53 MIL/uL — ABNORMAL LOW (ref 3.87–5.11)
RDW: 14.5 % (ref 11.5–15.5)
WBC Count: 5.8 10*3/uL (ref 4.0–10.5)
nRBC: 0 % (ref 0.0–0.2)

## 2018-01-22 LAB — CMP (CANCER CENTER ONLY)
ALT: 23 U/L (ref 0–44)
AST: 16 U/L (ref 15–41)
Albumin: 3.4 g/dL — ABNORMAL LOW (ref 3.5–5.0)
Alkaline Phosphatase: 70 U/L (ref 38–126)
Anion gap: 10 (ref 5–15)
BUN: 9 mg/dL (ref 6–20)
CO2: 26 mmol/L (ref 22–32)
Calcium: 9.2 mg/dL (ref 8.9–10.3)
Chloride: 105 mmol/L (ref 98–111)
Creatinine: 0.71 mg/dL (ref 0.44–1.00)
GFR, Est AFR Am: >60 mL/min (ref >60)
GFR, Estimated: >60 mL/min (ref >60)
Glucose, Bld: 95 mg/dL (ref 70–99)
Potassium: 3.4 mmol/L — ABNORMAL LOW (ref 3.5–5.1)
Sodium: 141 mmol/L (ref 135–145)
Total Bilirubin: 0.3 mg/dL (ref 0.3–1.2)
Total Protein: 6.7 g/dL (ref 6.5–8.1)

## 2018-01-22 MED ORDER — PALONOSETRON HCL INJECTION 0.25 MG/5ML
0.2500 mg | Freq: Once | INTRAVENOUS | Status: AC
  Administered 2018-01-22: 0.25 mg via INTRAVENOUS

## 2018-01-22 MED ORDER — SODIUM CHLORIDE 0.9 % IV SOLN
420.0000 mg | Freq: Once | INTRAVENOUS | Status: AC
  Administered 2018-01-22: 420 mg via INTRAVENOUS
  Filled 2018-01-22: qty 14

## 2018-01-22 MED ORDER — SODIUM CHLORIDE 0.9 % IV SOLN
Freq: Once | INTRAVENOUS | Status: AC
  Administered 2018-01-22: 12:00:00 via INTRAVENOUS
  Filled 2018-01-22: qty 5

## 2018-01-22 MED ORDER — SODIUM CHLORIDE 0.9% FLUSH
10.0000 mL | INTRAVENOUS | Status: DC | PRN
  Administered 2018-01-22: 10 mL
  Filled 2018-01-22: qty 10

## 2018-01-22 MED ORDER — TRASTUZUMAB CHEMO 150 MG IV SOLR: 6.0000 mg/kg | Freq: Once | INTRAVENOUS | Status: DC

## 2018-01-22 MED ORDER — HEPARIN SOD (PORK) LOCK FLUSH 100 UNIT/ML IV SOLN
500.0000 [IU] | Freq: Once | INTRAVENOUS | Status: AC | PRN
  Administered 2018-01-22: 500 [IU]
  Filled 2018-01-22: qty 5

## 2018-01-22 MED ORDER — SODIUM CHLORIDE 0.9 % IV SOLN
700.0000 mg | Freq: Once | INTRAVENOUS | Status: AC
  Administered 2018-01-22: 700 mg via INTRAVENOUS
  Filled 2018-01-22: qty 70

## 2018-01-22 MED ORDER — ACETAMINOPHEN 325 MG PO TABS
ORAL_TABLET | ORAL | Status: AC
  Filled 2018-01-22: qty 2

## 2018-01-22 MED ORDER — DIPHENHYDRAMINE HCL 25 MG PO CAPS
ORAL_CAPSULE | ORAL | Status: AC
  Filled 2018-01-22: qty 2

## 2018-01-22 MED ORDER — SODIUM CHLORIDE 0.9 % IV SOLN
75.0000 mg/m2 | Freq: Once | INTRAVENOUS | Status: AC
  Administered 2018-01-22: 170 mg via INTRAVENOUS
  Filled 2018-01-22: qty 17

## 2018-01-22 MED ORDER — DIPHENHYDRAMINE HCL 25 MG PO CAPS: 50.0000 mg | ORAL_CAPSULE | Freq: Once | ORAL | Status: DC

## 2018-01-22 MED ORDER — ACETAMINOPHEN 325 MG PO TABS: 650.0000 mg | ORAL_TABLET | Freq: Once | ORAL | Status: DC

## 2018-01-22 MED ORDER — SODIUM CHLORIDE 0.9 % IV SOLN
Freq: Once | INTRAVENOUS | Status: AC
  Administered 2018-01-22: 11:00:00 via INTRAVENOUS
  Filled 2018-01-22: qty 250

## 2018-01-22 MED ORDER — PALONOSETRON HCL INJECTION 0.25 MG/5ML
INTRAVENOUS | Status: AC
  Filled 2018-01-22: qty 5

## 2018-01-22 MED ORDER — TRASTUZUMAB CHEMO 150 MG IV SOLR
600.0000 mg | Freq: Once | INTRAVENOUS | Status: AC
  Administered 2018-01-22: 600 mg via INTRAVENOUS
  Filled 2018-01-22: qty 28.57

## 2018-01-22 NOTE — Patient Instructions (Signed)
[facility] Discharge Instructions for Patients Receiving Chemotherapy  Today you received the following chemotherapy agents: Herceptin, Perjeta, Taxotere and Carboplatin.  To help prevent nausea and vomiting after your treatment, we encourage you to take your nausea medication as prescribed.  If you develop nausea and vomiting that is not controlled by your nausea medication, call the clinic.   BELOW ARE SYMPTOMS THAT SHOULD BE REPORTED IMMEDIATELY:  *FEVER GREATER THAN 100.5 F  *CHILLS WITH OR WITHOUT FEVER  NAUSEA AND VOMITING THAT IS NOT CONTROLLED WITH YOUR NAUSEA MEDICATION  *UNUSUAL SHORTNESS OF BREATH  *UNUSUAL BRUISING OR BLEEDING  TENDERNESS IN MOUTH AND THROAT WITH OR WITHOUT PRESENCE OF ULCERS  *URINARY PROBLEMS  *BOWEL PROBLEMS  UNUSUAL RASH Items with * indicate a potential emergency and should be followed up as soon as possible.  Feel free to call the clinic should you have any questions or concerns. The clinic phone number is [phone].  Please show the CHEMO ALERT CARD at check-in to the Emergency Department and triage nurse.  

## 2018-01-22 NOTE — Progress Notes (Signed)
[facility] CSW Progress Note  Referral made for [facility] guide at patient request.  Edwyna Shell, LCSW Clinical Social Worker Phone:  [phone]

## 2018-01-22 NOTE — Progress Notes (Signed)
Patient Care Team: Briscoe Deutscher, DO as PCP - General (Family Medicine) Macario Carls, MD as Referring Physician (Specialist) Erroll Luna, MD as Consulting Physician (General Surgery) Nicholas Lose, MD as Consulting Physician (Hematology and Oncology) Kyung Rudd, MD as Consulting Physician (Radiation Oncology)  DIAGNOSIS:  Encounter Diagnosis  Name Primary?  . Malignant neoplasm of upper-outer quadrant of right breast in female, estrogen receptor positive ([place])     SUMMARY OF ONCOLOGIC HISTORY:   Malignant neoplasm of upper-outer quadrant of right breast in female, estrogen receptor positive ([place])   11/11/2017 Initial Diagnosis    Palpable right breast mass at 10 o'clock position 1.7 cm, axilla negative, biopsy revealed grade 3 IDC ER 60%, PR 10%, Ki-67 70%, HER-2 positive, T1 CN 0 stage Ia    11/19/2017 Cancer Staging    Staging form: Breast, AJCC 8th Edition - Clinical: Stage IB (cT2, cN0, cM0, G3, ER+, PR+, HER2+) - Signed by Nicholas Lose, MD on 12/04/2017    11/25/2017 Breast MRI    4.4 x 2.8 x 2.1 cm right breast malignancy UOQ, second focus 0.8 cm (biopsy planned for 12/09/2017)    12/04/2017 -  Neo-Adjuvant Chemotherapy    Initial plan was with Taxol Herceptin but it was switched to [facility] Perjeta every 3 week x6 followed by Herceptin plus/minus Perjeta maintenance when the breast MRI showed more extensive disease.    01/04/2018 Genetic Testing    Negative genetic testing on the multi-cancer panel.  The Multi-Gene Panel offered by [facility] includes sequencing and/or deletion duplication testing of the following 84 genes: AIP, ALK, APC, ATM, AXIN2,BAP1,  BARD1, BLM, BMPR1A, BRCA1, BRCA2, BRIP1, CASR, CDC73, CDH1, CDK4, CDKN1B, CDKN1C, CDKN2A (p14ARF), CDKN2A (p16INK4a), CEBPA, CHEK2, CTNNA1, DICER1, DIS3L2, EGFR (c.2369C>T, p.Thr790Met variant only), EPCAM (Deletion/duplication testing only), FH, FLCN, GATA2, GPC3, GREM1 (Promoter region deletion/duplication testing only), HOXB13  (c.251G>A, p.Gly84Glu), HRAS, KIT, MAX, MEN1, MET, MITF (c.952G>A, p.Glu318Lys variant only), MLH1, MSH2, MSH3, MSH6, MUTYH, NBN, NF1, NF2, NTHL1, PALB2, PDGFRA, PHOX2B, PMS2, POLD1, POLE, POT1, PRKAR1A, PTCH1, PTEN, RAD50, RAD51C, RAD51D, RB1, RECQL4, RET, RUNX1, SDHAF2, SDHA (sequence changes only), SDHB, SDHC, SDHD, SMAD4, SMARCA4, SMARCB1, SMARCE1, STK11, SUFU, TERC, TERT, TMEM127, TP53, TSC1, TSC2, VHL, WRN and WT1.  The report date is January 04, 2018.     CHIEF COMPLIANT: Cycle 3 TCH Perjeta  INTERVAL HISTORY: Sandra [name] is a 51 year old with above-mentioned history of HER-2 positive right breast cancer who is currently neoadjuvant chemotherapy and today is cycle 3 of [place].  She has experienced more nausea with the last cycle of chemo she has had 2 episodes of loose stools every day she has been taking Imodium once a day.  She also takes Zofran once a day.  She has not been taking any Compazine.  Denies any nausea or vomiting.  She does have fatigue for 1 week after chemotherapy.  REVIEW OF SYSTEMS:   Constitutional: Fatigue Eyes: Denies blurriness of vision Ears, nose, mouth, throat, and face: Denies mucositis or sore throat Respiratory: Denies cough, dyspnea or wheezes Cardiovascular: Denies palpitation, chest discomfort Gastrointestinal: Diarrhea and nausea  Skin: Denies abnormal skin rashes Lymphatics: Denies new lymphadenopathy or easy bruising Neurological:Denies numbness, tingling or new weaknesses Behavioral/Psych: Mood is stable, no new changes  Extremities: No lower extremity edema  All other systems were reviewed with the patient and are negative.  I have reviewed the past medical history, past surgical history, social history and family history with the patient and they are unchanged from previous note.  ALLERGIES:  has No Known Allergies.  MEDICATIONS:  Current Outpatient Medications  Medication Sig Dispense Refill  . betamethasone valerate ointment  (VALISONE) 0.1 % Apply bid for 1-2 weeks as needed for flare of lichen sclerosis. 15 g 1  . dexamethasone (DECADRON) 4 MG tablet Take 1 tablet (4 mg total) by mouth daily. 1 tab day before chemo and 1 tab day after chemo 12 tablet 0  . ibuprofen (ADVIL,MOTRIN) 800 MG tablet Take 1 tablet (800 mg total) by mouth every 8 (eight) hours as needed. 30 tablet 0  . lidocaine-prilocaine (EMLA) cream   5  . LORazepam (ATIVAN) 1 MG tablet Take 1 tablet (1 mg total) by mouth every 8 (eight) hours as needed for anxiety (or nausea). 30 tablet 0  . Omega-3 Fatty Acids (FISH OIL) 1000 MG CAPS Take 1 capsule by mouth daily.    . ondansetron (ZOFRAN) 8 MG tablet Take 1 tablet (8 mg total) by mouth every 8 (eight) hours as needed for nausea. 30 tablet 3  . venlafaxine XR (EFFEXOR-XR) 75 MG 24 hr capsule Take 1 capsule (75 mg total) by mouth daily. 90 capsule 3   No current facility-administered medications for this visit.     PHYSICAL EXAMINATION: ECOG PERFORMANCE STATUS: 1 - Symptomatic but completely ambulatory  Vitals:   01/22/18 1053  BP: 127/83  Pulse: 86  Resp: 18  Temp: 98.2 F (36.8 C)  SpO2: 100%   Filed Weights   01/22/18 1053  Weight: 206 lb 6.4 oz (93.6 kg)    GENERAL:alert, no distress and comfortable SKIN: skin color, texture, turgor are normal, no rashes or significant lesions EYES: normal, Conjunctiva are pink and non-injected, sclera clear OROPHARYNX:no exudate, no erythema and lips, buccal mucosa, and tongue normal  NECK: supple, thyroid normal size, non-tender, without nodularity LYMPH:  no palpable lymphadenopathy in the cervical, axillary or inguinal LUNGS: clear to auscultation and percussion with normal breathing effort HEART: regular rate & rhythm and no murmurs and no lower extremity edema ABDOMEN:abdomen soft, non-tender and normal bowel sounds MUSCULOSKELETAL:no cyanosis of digits and no clubbing  NEURO: alert & oriented x 3 with fluent speech, no focal motor/sensory  deficits EXTREMITIES: No lower extremity edema   LABORATORY DATA:  I have reviewed the data as listed CMP Latest Ref Rng & Units 01/01/2018 12/18/2017 12/10/2017  Glucose 70 - 99 mg/dL 87 93 121(H)  BUN 6 - 20 mg/dL '8 9 10  ' Creatinine 0.44 - 1.00 mg/dL 0.82 0.99 0.90  Sodium 135 - 145 mmol/L 140 140 138  Potassium 3.5 - 5.1 mmol/L 3.6 4.4 3.6  Chloride 98 - 111 mmol/L 105 105 106  CO2 22 - 32 mmol/L '28 26 24  ' Calcium 8.9 - 10.3 mg/dL 9.1 9.3 8.8(L)  Total Protein 6.5 - 8.1 g/dL 6.7 7.0 6.6  Total Bilirubin 0.3 - 1.2 mg/dL 0.3 0.4 0.3  Alkaline Phos 38 - 126 U/L 64 71 55  AST 15 - 41 U/L 14(L) 18 15  ALT 0 - 44 U/L '16 21 16    ' Lab Results  Component Value Date   WBC 5.8 01/22/2018   HGB 10.6 (L) 01/22/2018   HCT 32.0 (L) 01/22/2018   MCV 90.7 01/22/2018   PLT 242 01/22/2018   NEUTROABS 3.3 01/22/2018    ASSESSMENT & PLAN:  Malignant neoplasm of upper-outer quadrant of right breast in female, estrogen receptor positive ([place]) 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%,  PR 10%, Ki-67 70%, HER-2 positive, T1 CN 0 stage Ia Breast MRI 11/25/2017: 4.4 x 2.8 x 2.1 cm right breast malignancy UOQ, second focus 0.8 cm(biopsy plannedfor 12/09/2017), overall extent of disease is 7 cm 12/12/2017:Biopsy results of the MRI guided biopsies 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  Treatment plan: 1.Neoadjuvant chemotherapy with [employer] Perjetafollowed by Herceptin and Perjetamaintenance for 1 year 2.breast conserving surgery with sentinel lymph node biopsy 3.Radiation therapy 4.Adjuvant antiestrogen therapy -------------------------------------------------------------------------------------------------------------------------------- Current Treatment: Cycle 3 TCHP   Chemo Toxicities: 1.Diarrhea 1-2 times per day well controlled with Imodium 2.change in taste 3.Fatigue from  chemo 4.Mild bone pain from Neulasta 5 .  Nausea: I instructed her to take Compazine in addition to the Zofran. 6.  Chemotherapy-induced anemia: Monitoring closely today's hemoglobin is 10.6  Blood work was reviewed RTC in 3 weeks for cycle 4    No orders of the defined types were placed in this encounter.  The patient has a good understanding of the overall plan. she agrees with it. she will call with any problems that may develop before the next visit here.   Harriette Ohara, MD 01/22/18

## 2018-01-22 NOTE — Research (Signed)
[id number] Study:  Met with patient in infusion room to review required upcoming 3 month study activities.  Informed patient they are due 03/04/18 +/- 30 days.   Patient chose to have Cardiac MRI, Physical functions and Neurocognitive testing on 03/02/18.  She will have research labs and complete her questionnaires on day of chemo which is 03/05/18.   Cardiac MRI scheduled for 03/02/18 at 10 am at [facility] MRI.  Informed patient of appointment and she agreed to come to [facility] after the MRI to meet with research nurse for the physical and neurocognitive testing.  Gave patient my phone number again and asked her to call if any problems with the schedule or any questions/ concerns before our next contact.  She verbalized understanding.  Thanked patient very much for her ongoing participation in this study.  Foye Spurling, BSN, RN Clinical Research Nurse 01/22/2018 2:26 PM

## 2018-01-22 NOTE — Progress Notes (Signed)
[facility] CSW Progress Notes  Request received from Clinical Trials RN to speak w patient re resources for individual and family counseling/support.  Met w patient and husband in infusion room, provided information packet.  Will refer patient to [facility] for medications management, no current provider.  Was previously seeing psychiatrist for meds mgmt, but provider is no longer available.  Pt currently seeing individual therapist at [facility] and will continue.  Wants resources to better support children (11 and 14) and husband/caregiver.  Sent staff message to scheduler at [facility], will discuss options for therapy referrals w patient next week after upcoming therapist training.  Edwyna Shell, LCSW Clinical Social Worker Phone:  [phone]

## 2018-01-22 NOTE — Assessment & Plan Note (Signed)
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  Treatment plan: 1.Neoadjuvant chemotherapy with [place] Perjetafollowed by Herceptin and Perjetamaintenance for 1 year 2.breast conserving surgery with sentinel lymph node biopsy 3.Radiation therapy 4.Adjuvant antiestrogen therapy -------------------------------------------------------------------------------------------------------------------------------- Current Treatment: Cycle 3 TCHP (being given tomorrow) Chemo Toxicities: 1.Diarrhea 1-2 times per day well controlled with Imodium 2.change in taste 3.Fatigue from chemo 4.Mild bone pain from Neulasta  Denied any nausea vomiting Blood work was reviewed RTC in 3 weeks for cycle 4

## 2018-01-24 ENCOUNTER — Inpatient Hospital Stay: Payer: BLUE CROSS/BLUE SHIELD

## 2018-01-24 VITALS — BP 121/77 | HR 72 | Temp 98.3°F | Resp 18

## 2018-01-24 DIAGNOSIS — C50411 Malignant neoplasm of upper-outer quadrant of right female breast: Secondary | ICD-10-CM

## 2018-01-24 DIAGNOSIS — Z170 Estrogen receptor positive status [ER+]: Secondary | ICD-10-CM | POA: Diagnosis not present

## 2018-01-24 DIAGNOSIS — D6481 Anemia due to antineoplastic chemotherapy: Secondary | ICD-10-CM | POA: Diagnosis not present

## 2018-01-24 DIAGNOSIS — Z79899 Other long term (current) drug therapy: Secondary | ICD-10-CM | POA: Diagnosis not present

## 2018-01-24 DIAGNOSIS — Z5112 Encounter for antineoplastic immunotherapy: Secondary | ICD-10-CM | POA: Diagnosis not present

## 2018-01-24 DIAGNOSIS — Z5189 Encounter for other specified aftercare: Secondary | ICD-10-CM | POA: Diagnosis not present

## 2018-01-24 DIAGNOSIS — R530 Neoplastic (malignant) related fatigue: Secondary | ICD-10-CM | POA: Diagnosis not present

## 2018-01-24 DIAGNOSIS — Z5111 Encounter for antineoplastic chemotherapy: Secondary | ICD-10-CM | POA: Diagnosis not present

## 2018-01-24 MED ORDER — PEGFILGRASTIM-CBQV 6 MG/0.6ML ~~LOC~~ SOSY
6.0000 mg | PREFILLED_SYRINGE | Freq: Once | SUBCUTANEOUS | Status: AC
  Administered 2018-01-24: 6 mg via SUBCUTANEOUS

## 2018-01-24 MED ORDER — PEGFILGRASTIM-CBQV 6 MG/0.6ML ~~LOC~~ SOSY
PREFILLED_SYRINGE | SUBCUTANEOUS | Status: AC
  Filled 2018-01-24: qty 0.6

## 2018-01-24 NOTE — Patient Instructions (Signed)
Pegfilgrastim injection What is this medicine? PEGFILGRASTIM (PEG fil gra stim) is a long-acting granulocyte colony-stimulating factor that stimulates the growth of neutrophils, a type of white blood cell important in the body's fight against infection. It is used to reduce the incidence of fever and infection in patients with certain types of cancer who are receiving chemotherapy that affects the bone marrow, and to increase survival after being exposed to high doses of radiation. This medicine may be used for other purposes; ask your health care provider or pharmacist if you have questions. COMMON BRAND NAME(S): Neulasta What should I tell my health care provider before I take this medicine? They need to know if you have any of these conditions: -kidney disease -latex allergy -ongoing radiation therapy -sickle cell disease -skin reactions to acrylic adhesives (On-Body Injector only) -an unusual or allergic reaction to pegfilgrastim, filgrastim, other medicines, foods, dyes, or preservatives -pregnant or trying to get pregnant -breast-feeding How should I use this medicine? This medicine is for injection under the skin. If you get this medicine at home, you will be taught how to prepare and give the pre-filled syringe or how to use the On-body Injector. Refer to the patient Instructions for Use for detailed instructions. Use exactly as directed. Tell your healthcare provider immediately if you suspect that the On-body Injector may not have performed as intended or if you suspect the use of the On-body Injector resulted in a missed or partial dose. It is important that you put your used needles and syringes in a special sharps container. Do not put them in a trash can. If you do not have a sharps container, call your pharmacist or healthcare provider to get one. Talk to your pediatrician regarding the use of this medicine in children. While this drug may be prescribed for selected conditions,  precautions do apply. Overdosage: If you think you have taken too much of this medicine contact a poison control center or emergency room at once. NOTE: This medicine is only for you. Do not share this medicine with others. What if I miss a dose? It is important not to miss your dose. Call your doctor or health care professional if you miss your dose. If you miss a dose due to an On-body Injector failure or leakage, a new dose should be administered as soon as possible using a single prefilled syringe for manual use. What may interact with this medicine? Interactions have not been studied. Give your health care provider a list of all the medicines, herbs, non-prescription drugs, or dietary supplements you use. Also tell them if you smoke, drink alcohol, or use illegal drugs. Some items may interact with your medicine. This list may not describe all possible interactions. Give your health care provider a list of all the medicines, herbs, non-prescription drugs, or dietary supplements you use. Also tell them if you smoke, drink alcohol, or use illegal drugs. Some items may interact with your medicine. What should I watch for while using this medicine? You may need blood work done while you are taking this medicine. If you are going to need a MRI, CT scan, or other procedure, tell your doctor that you are using this medicine (On-Body Injector only). What side effects may I notice from receiving this medicine? Side effects that you should report to your doctor or health care professional as soon as possible: -allergic reactions like skin rash, itching or hives, swelling of the face, lips, or tongue -dizziness -fever -pain, redness, or irritation at site   where injected -pinpoint red spots on the skin -red or dark-brown urine -shortness of breath or breathing problems -stomach or side pain, or pain at the shoulder -swelling -tiredness -trouble passing urine or change in the amount of urine Side  effects that usually do not require medical attention (report to your doctor or health care professional if they continue or are bothersome): -bone pain -muscle pain This list may not describe all possible side effects. Call your doctor for medical advice about side effects. You may report side effects to FDA at 1-800-FDA-1088. Where should I keep my medicine? Keep out of the reach of children. Store pre-filled syringes in a refrigerator between 2 and 8 degrees C (36 and 46 degrees F). Do not freeze. Keep in carton to protect from light. Throw away this medicine if it is left out of the refrigerator for more than 48 hours. Throw away any unused medicine after the expiration date. NOTE: This sheet is a summary. It may not cover all possible information. If you have questions about this medicine, talk to your doctor, pharmacist, or health care provider.  2018 Elsevier/Gold Standard (2016-03-14 12:58:03)  

## 2018-01-29 ENCOUNTER — Other Ambulatory Visit: Payer: Self-pay

## 2018-01-29 ENCOUNTER — Ambulatory Visit: Payer: Self-pay

## 2018-02-12 ENCOUNTER — Telehealth: Payer: Self-pay | Admitting: Hematology and Oncology

## 2018-02-12 ENCOUNTER — Encounter: Payer: Self-pay | Admitting: General Practice

## 2018-02-12 ENCOUNTER — Inpatient Hospital Stay: Payer: BLUE CROSS/BLUE SHIELD | Attending: Hematology and Oncology

## 2018-02-12 ENCOUNTER — Inpatient Hospital Stay (HOSPITAL_BASED_OUTPATIENT_CLINIC_OR_DEPARTMENT_OTHER): Payer: BLUE CROSS/BLUE SHIELD | Admitting: Hematology and Oncology

## 2018-02-12 ENCOUNTER — Inpatient Hospital Stay: Payer: BLUE CROSS/BLUE SHIELD

## 2018-02-12 DIAGNOSIS — Z170 Estrogen receptor positive status [ER+]: Secondary | ICD-10-CM

## 2018-02-12 DIAGNOSIS — Z79899 Other long term (current) drug therapy: Secondary | ICD-10-CM | POA: Diagnosis not present

## 2018-02-12 DIAGNOSIS — R197 Diarrhea, unspecified: Secondary | ICD-10-CM

## 2018-02-12 DIAGNOSIS — R530 Neoplastic (malignant) related fatigue: Secondary | ICD-10-CM | POA: Diagnosis not present

## 2018-02-12 DIAGNOSIS — C50411 Malignant neoplasm of upper-outer quadrant of right female breast: Secondary | ICD-10-CM

## 2018-02-12 DIAGNOSIS — R1013 Epigastric pain: Secondary | ICD-10-CM

## 2018-02-12 DIAGNOSIS — D6481 Anemia due to antineoplastic chemotherapy: Secondary | ICD-10-CM

## 2018-02-12 DIAGNOSIS — Z23 Encounter for immunization: Secondary | ICD-10-CM

## 2018-02-12 DIAGNOSIS — Z5112 Encounter for antineoplastic immunotherapy: Secondary | ICD-10-CM | POA: Insufficient documentation

## 2018-02-12 DIAGNOSIS — F329 Major depressive disorder, single episode, unspecified: Secondary | ICD-10-CM

## 2018-02-12 DIAGNOSIS — Z95828 Presence of other vascular implants and grafts: Secondary | ICD-10-CM

## 2018-02-12 DIAGNOSIS — E876 Hypokalemia: Secondary | ICD-10-CM

## 2018-02-12 DIAGNOSIS — Z5111 Encounter for antineoplastic chemotherapy: Secondary | ICD-10-CM | POA: Diagnosis not present

## 2018-02-12 DIAGNOSIS — Z5189 Encounter for other specified aftercare: Secondary | ICD-10-CM | POA: Insufficient documentation

## 2018-02-12 LAB — CBC WITH DIFFERENTIAL (CANCER CENTER ONLY)
Abs Immature Granulocytes: 0.02 10*3/uL (ref 0.00–0.07)
Basophils Absolute: 0 10*3/uL (ref 0.0–0.1)
Basophils Relative: 0 %
Eosinophils Absolute: 0 10*3/uL (ref 0.0–0.5)
Eosinophils Relative: 0 %
HCT: 30.2 % — ABNORMAL LOW (ref 36.0–46.0)
Hemoglobin: 10.1 g/dL — ABNORMAL LOW (ref 12.0–15.0)
Immature Granulocytes: 0 %
Lymphocytes Relative: 32 %
Lymphs Abs: 1.8 10*3/uL (ref 0.7–4.0)
MCH: 31 pg (ref 26.0–34.0)
MCHC: 33.4 g/dL (ref 30.0–36.0)
MCV: 92.6 fL (ref 80.0–100.0)
Monocytes Absolute: 0.7 10*3/uL (ref 0.1–1.0)
Monocytes Relative: 12 %
Neutro Abs: 3.2 10*3/uL (ref 1.7–7.7)
Neutrophils Relative %: 56 %
Platelet Count: 206 10*3/uL (ref 150–400)
RBC: 3.26 MIL/uL — ABNORMAL LOW (ref 3.87–5.11)
RDW: 15.5 % (ref 11.5–15.5)
WBC Count: 5.8 10*3/uL (ref 4.0–10.5)
nRBC: 0 % (ref 0.0–0.2)

## 2018-02-12 LAB — CMP (CANCER CENTER ONLY)
ALT: 15 U/L (ref 0–44)
AST: 13 U/L — ABNORMAL LOW (ref 15–41)
Albumin: 3.4 g/dL — ABNORMAL LOW (ref 3.5–5.0)
Alkaline Phosphatase: 65 U/L (ref 38–126)
Anion gap: 10 (ref 5–15)
BUN: 6 mg/dL (ref 6–20)
CO2: 29 mmol/L (ref 22–32)
Calcium: 9.2 mg/dL (ref 8.9–10.3)
Chloride: 103 mmol/L (ref 98–111)
Creatinine: 0.77 mg/dL (ref 0.44–1.00)
GFR, Est AFR Am: >60 mL/min (ref >60)
GFR, Estimated: >60 mL/min (ref >60)
Glucose, Bld: 83 mg/dL (ref 70–99)
Potassium: 3 mmol/L — CL (ref 3.5–5.1)
Sodium: 142 mmol/L (ref 135–145)
Total Bilirubin: 0.3 mg/dL (ref 0.3–1.2)
Total Protein: 6.5 g/dL (ref 6.5–8.1)

## 2018-02-12 MED ORDER — SODIUM CHLORIDE 0.9 % IV SOLN
420.0000 mg | Freq: Once | INTRAVENOUS | Status: AC
  Administered 2018-02-12: 420 mg via INTRAVENOUS
  Filled 2018-02-12: qty 14

## 2018-02-12 MED ORDER — SODIUM CHLORIDE 0.9 % IV SOLN
75.0000 mg/m2 | Freq: Once | INTRAVENOUS | Status: AC
  Administered 2018-02-12: 170 mg via INTRAVENOUS
  Filled 2018-02-12: qty 17

## 2018-02-12 MED ORDER — SODIUM CHLORIDE 0.9% FLUSH
10.0000 mL | INTRAVENOUS | Status: DC | PRN
  Administered 2018-02-12: 10 mL
  Filled 2018-02-12: qty 10

## 2018-02-12 MED ORDER — SODIUM CHLORIDE 0.9 % IV SOLN
Freq: Once | INTRAVENOUS | Status: AC
  Administered 2018-02-12: 10:00:00 via INTRAVENOUS
  Filled 2018-02-12: qty 5

## 2018-02-12 MED ORDER — POTASSIUM CHLORIDE CRYS ER 20 MEQ PO TBCR
EXTENDED_RELEASE_TABLET | ORAL | Status: AC
  Filled 2018-02-12: qty 2

## 2018-02-12 MED ORDER — INFLUENZA VAC SPLIT QUAD 0.5 ML IM SUSY
PREFILLED_SYRINGE | INTRAMUSCULAR | Status: AC
  Filled 2018-02-12: qty 0.5

## 2018-02-12 MED ORDER — ACETAMINOPHEN 325 MG PO TABS: 650.0000 mg | ORAL_TABLET | Freq: Once | ORAL | Status: DC

## 2018-02-12 MED ORDER — HEPARIN SOD (PORK) LOCK FLUSH 100 UNIT/ML IV SOLN
500.0000 [IU] | Freq: Once | INTRAVENOUS | Status: AC | PRN
  Administered 2018-02-12: 500 [IU]
  Filled 2018-02-12: qty 5

## 2018-02-12 MED ORDER — PALONOSETRON HCL INJECTION 0.25 MG/5ML
INTRAVENOUS | Status: AC
  Filled 2018-02-12: qty 5

## 2018-02-12 MED ORDER — SODIUM CHLORIDE 0.9 % IV SOLN
Freq: Once | INTRAVENOUS | Status: AC
  Administered 2018-02-12: 10:00:00 via INTRAVENOUS
  Filled 2018-02-12: qty 250

## 2018-02-12 MED ORDER — DIPHENHYDRAMINE HCL 25 MG PO CAPS: 50.0000 mg | ORAL_CAPSULE | Freq: Once | ORAL | Status: DC

## 2018-02-12 MED ORDER — TRASTUZUMAB CHEMO 150 MG IV SOLR
600.0000 mg | Freq: Once | INTRAVENOUS | Status: AC
  Administered 2018-02-12: 600 mg via INTRAVENOUS
  Filled 2018-02-12: qty 28.57

## 2018-02-12 MED ORDER — PALONOSETRON HCL INJECTION 0.25 MG/5ML
0.2500 mg | Freq: Once | INTRAVENOUS | Status: AC
  Administered 2018-02-12: 0.25 mg via INTRAVENOUS

## 2018-02-12 MED ORDER — INFLUENZA VAC SPLIT QUAD 0.5 ML IM SUSY
0.5000 mL | PREFILLED_SYRINGE | Freq: Once | INTRAMUSCULAR | Status: AC
  Administered 2018-02-12: 0.5 mL via INTRAMUSCULAR

## 2018-02-12 MED ORDER — POTASSIUM CHLORIDE CRYS ER 20 MEQ PO TBCR
40.0000 meq | EXTENDED_RELEASE_TABLET | Freq: Once | ORAL | Status: AC
  Administered 2018-02-12: 40 meq via ORAL

## 2018-02-12 MED ORDER — POTASSIUM CHLORIDE CRYS ER 20 MEQ PO TBCR: 20.0000 meq | EXTENDED_RELEASE_TABLET | Freq: Every day | ORAL | 0 refills | Status: DC

## 2018-02-12 MED ORDER — SODIUM CHLORIDE 0.9 % IV SOLN
700.0000 mg | Freq: Once | INTRAVENOUS | Status: AC
  Administered 2018-02-12: 700 mg via INTRAVENOUS
  Filled 2018-02-12: qty 70

## 2018-02-12 NOTE — Progress Notes (Signed)
Patient Care Team: Briscoe Deutscher, DO as PCP - General (Family Medicine) Macario Carls, MD as Referring Physician (Specialist) Erroll Luna, MD as Consulting Physician (General Surgery) Nicholas Lose, MD as Consulting Physician (Hematology and Oncology) Kyung Rudd, MD as Consulting Physician (Radiation Oncology)  DIAGNOSIS:  Encounter Diagnosis  Name Primary?  . Malignant neoplasm of upper-outer quadrant of right breast in female, estrogen receptor positive ([place])     SUMMARY OF ONCOLOGIC HISTORY:   Malignant neoplasm of upper-outer quadrant of right breast in female, estrogen receptor positive ([place])   11/11/2017 Initial Diagnosis    Palpable right breast mass at 10 o'clock position 1.7 cm, axilla negative, biopsy revealed grade 3 IDC ER 60%, PR 10%, Ki-67 70%, HER-2 positive, T1 CN 0 stage Ia    11/19/2017 Cancer Staging    Staging form: Breast, AJCC 8th Edition - Clinical: Stage IB (cT2, cN0, cM0, G3, ER+, PR+, HER2+) - Signed by Nicholas Lose, MD on 12/04/2017    11/25/2017 Breast MRI    4.4 x 2.8 x 2.1 cm right breast malignancy UOQ, second focus 0.8 cm (biopsy planned for 12/09/2017)    12/04/2017 -  Neo-Adjuvant Chemotherapy    Initial plan was with Taxol Herceptin but it was switched to [facility] Perjeta every 3 week x6 followed by Herceptin plus/minus Perjeta maintenance when the breast MRI showed more extensive disease.    01/04/2018 Genetic Testing    Negative genetic testing on the multi-cancer panel.  The Multi-Gene Panel offered by [facility] includes sequencing and/or deletion duplication testing of the following 84 genes: AIP, ALK, APC, ATM, AXIN2,BAP1,  BARD1, BLM, BMPR1A, BRCA1, BRCA2, BRIP1, CASR, CDC73, CDH1, CDK4, CDKN1B, CDKN1C, CDKN2A (p14ARF), CDKN2A (p16INK4a), CEBPA, CHEK2, CTNNA1, DICER1, DIS3L2, EGFR (c.2369C>T, p.Thr790Met variant only), EPCAM (Deletion/duplication testing only), FH, FLCN, GATA2, GPC3, GREM1 (Promoter region deletion/duplication testing only), HOXB13  (c.251G>A, p.Gly84Glu), HRAS, KIT, MAX, MEN1, MET, MITF (c.952G>A, p.Glu318Lys variant only), MLH1, MSH2, MSH3, MSH6, MUTYH, NBN, NF1, NF2, NTHL1, PALB2, PDGFRA, PHOX2B, PMS2, POLD1, POLE, POT1, PRKAR1A, PTCH1, PTEN, RAD50, RAD51C, RAD51D, RB1, RECQL4, RET, RUNX1, SDHAF2, SDHA (sequence changes only), SDHB, SDHC, SDHD, SMAD4, SMARCA4, SMARCB1, SMARCE1, STK11, SUFU, TERC, TERT, TMEM127, TP53, TSC1, TSC2, VHL, WRN and WT1.  The report date is January 04, 2018.     CHIEF COMPLIANT: Cycle 4 TCH Perjeta  INTERVAL HISTORY: Sandra [name] is a 52 year old with above-mentioned history of triple positive right breast cancer who is here to receive her fourth cycle of [place].  She gets epigastric abdominal discomfort and 2 to 3 days of diarrhea.  She denies any nausea vomiting.  She does have profound fatigue.  Her appetite is poor and she has not been eating as much protein.  She is trying to keep up on the hydration.  She takes Imodium for the diarrhea and it appears to be working.  She is able to manage the diarrhea fairly well.  REVIEW OF SYSTEMS:   Constitutional: Denies fevers, chills or abnormal weight loss Eyes: Denies blurriness of vision Ears, nose, mouth, throat, and face: Denies mucositis or sore throat Respiratory: Denies cough, dyspnea or wheezes Cardiovascular: Denies palpitation, chest discomfort Gastrointestinal:  Denies nausea, heartburn or change in bowel habits Skin: Denies abnormal skin rashes Lymphatics: Denies new lymphadenopathy or easy bruising Neurological:Denies numbness, tingling or new weaknesses Behavioral/Psych: Depression Extremities: No lower extremity edema   All other systems were reviewed with the patient and are negative.  I have reviewed the past medical history, past surgical history, social history  and family history with the patient and they are unchanged from previous note.  ALLERGIES:  has No Known Allergies.  MEDICATIONS:  Current Outpatient  Medications  Medication Sig Dispense Refill  . betamethasone valerate ointment (VALISONE) 0.1 % Apply bid for 1-2 weeks as needed for flare of lichen sclerosis. 15 g 1  . dexamethasone (DECADRON) 4 MG tablet Take 1 tablet (4 mg total) by mouth daily. 1 tab day before chemo and 1 tab day after chemo 12 tablet 0  . ibuprofen (ADVIL,MOTRIN) 800 MG tablet Take 1 tablet (800 mg total) by mouth every 8 (eight) hours as needed. 30 tablet 0  . lidocaine-prilocaine (EMLA) cream   5  . LORazepam (ATIVAN) 1 MG tablet Take 1 tablet (1 mg total) by mouth every 8 (eight) hours as needed for anxiety (or nausea). 30 tablet 0  . Omega-3 Fatty Acids (FISH OIL) 1000 MG CAPS Take 1 capsule by mouth daily.    . ondansetron (ZOFRAN) 8 MG tablet Take 1 tablet (8 mg total) by mouth every 8 (eight) hours as needed for nausea. 30 tablet 3  . venlafaxine XR (EFFEXOR-XR) 75 MG 24 hr capsule Take 1 capsule (75 mg total) by mouth daily. 90 capsule 3   No current facility-administered medications for this visit.     PHYSICAL EXAMINATION: ECOG PERFORMANCE STATUS: 1 - Symptomatic but completely ambulatory  Vitals:   02/12/18 0839  BP: 138/77  Pulse: 86  Resp: 17  Temp: 98.3 F (36.8 C)  SpO2: 100%   Filed Weights   02/12/18 0839  Weight: 201 lb 3.2 oz (91.3 kg)    GENERAL:alert, no distress and comfortable SKIN: skin color, texture, turgor are normal, no rashes or significant lesions EYES: normal, Conjunctiva are pink and non-injected, sclera clear OROPHARYNX:no exudate, no erythema and lips, buccal mucosa, and tongue normal  NECK: supple, thyroid normal size, non-tender, without nodularity LYMPH:  no palpable lymphadenopathy in the cervical, axillary or inguinal LUNGS: clear to auscultation and percussion with normal breathing effort HEART: regular rate & rhythm and no murmurs and no lower extremity edema ABDOMEN:abdomen soft, non-tender and normal bowel sounds MUSCULOSKELETAL:no cyanosis of digits and no  clubbing  NEURO: alert & oriented x 3 with fluent speech, no focal motor/sensory deficits EXTREMITIES: No lower extremity edema   LABORATORY DATA:  I have reviewed the data as listed CMP Latest Ref Rng & Units 01/22/2018 01/01/2018 12/18/2017  Glucose 70 - 99 mg/dL 95 87 93  BUN 6 - 20 mg/dL '9 8 9  ' Creatinine 0.44 - 1.00 mg/dL 0.71 0.82 0.99  Sodium 135 - 145 mmol/L 141 140 140  Potassium 3.5 - 5.1 mmol/L 3.4(L) 3.6 4.4  Chloride 98 - 111 mmol/L 105 105 105  CO2 22 - 32 mmol/L '26 28 26  ' Calcium 8.9 - 10.3 mg/dL 9.2 9.1 9.3  Total Protein 6.5 - 8.1 g/dL 6.7 6.7 7.0  Total Bilirubin 0.3 - 1.2 mg/dL 0.3 0.3 0.4  Alkaline Phos 38 - 126 U/L 70 64 71  AST 15 - 41 U/L 16 14(L) 18  ALT 0 - 44 U/L '23 16 21    ' Lab Results  Component Value Date   WBC 5.8 02/12/2018   HGB 10.1 (L) 02/12/2018   HCT 30.2 (L) 02/12/2018   MCV 92.6 02/12/2018   PLT 206 02/12/2018   NEUTROABS 3.2 02/12/2018    ASSESSMENT & PLAN:  Malignant neoplasm of upper-outer quadrant of right breast in female, estrogen receptor positive ([place]) 11/11/2017:Palpable right breast mass at  10 o'clock position 1.7 cm, axilla negative, biopsy revealed grade 3 IDC ER 60%, PR 10%, Ki-67 70%, HER-2 positive, T1 CN 0 stage Ia Breast MRI 11/25/2017: 4.4 x 2.8 x 2.1 cm right breast malignancy UOQ, second focus 0.8 cm(biopsy plannedfor 12/09/2017), overall extent of disease is 7 cm 12/12/2017:Biopsy results of the MRI guided biopsies 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  Treatment plan: 1.Neoadjuvant chemotherapy with [place] Perjetafollowed by Herceptin and Perjetamaintenance for 1 year 2.breast conserving surgery with sentinel lymph node biopsy 3.Radiation therapy 4.Adjuvant antiestrogen therapy -------------------------------------------------------------------------------------------------------------------------------- Current Treatment: Cycle4TCHP   Chemo Toxicities: 1.Diarrhea 1-2 times per  day well controlled with Imodium 2.change in taste 3.Fatigue from chemo 4.Mild bone pain from Neulasta 5 .  Nausea: I instructed her to take Compazine in addition to the Zofran. 6.  Chemotherapy-induced anemia: Monitoring closely today's hemoglobin is 10.1 7.  Depression: Patient is seeking for counselors help getting through with her treatment.  We will try to arrange for that.  Blood work was reviewed RTC in3weeks for cycle 5    No orders of the defined types were placed in this encounter.  The patient has a good understanding of the overall plan. she agrees with it. she will call with any problems that may develop before the next visit here.   Harriette Ohara, MD 02/12/18

## 2018-02-12 NOTE — Telephone Encounter (Signed)
Injections added per 11/14 los.  Injection for 11/16 already in system.

## 2018-02-12 NOTE — Progress Notes (Signed)
[facility] CSW Progress Notes  Met w patient in infusion room, reviewed options for referrals for counseling, provided several options for patient to choose from.  Pt agreeable to making her own appointment, will reach out to CSW if she needs help.  Referral complete to [facility] for medications management - first available appt scheduled for Apr 07, 2018.  Discuss needs of children for mental health support, reviewed available options.  Patient will discuss possibilities w children, determine best fit and schedule based on needs.  Referral resubmitted to [facility] - has not been matched w mentor to date.  Would like Sales executive.  All questions answered, pt has my contact information and is encouraged to reach out at any time for resources/support.  Edwyna Shell, LCSW Clinical Social Worker Phone:  [phone]

## 2018-02-12 NOTE — Patient Instructions (Signed)
[facility] Discharge Instructions for Patients Receiving Chemotherapy  Today you received the following chemotherapy agents: Herceptin, Perjeta, Taxotere and Carboplatin.  To help prevent nausea and vomiting after your treatment, we encourage you to take your nausea medication as prescribed.  If you develop nausea and vomiting that is not controlled by your nausea medication, call the clinic.   BELOW ARE SYMPTOMS THAT SHOULD BE REPORTED IMMEDIATELY:  *FEVER GREATER THAN 100.5 F  *CHILLS WITH OR WITHOUT FEVER  NAUSEA AND VOMITING THAT IS NOT CONTROLLED WITH YOUR NAUSEA MEDICATION  *UNUSUAL SHORTNESS OF BREATH  *UNUSUAL BRUISING OR BLEEDING  TENDERNESS IN MOUTH AND THROAT WITH OR WITHOUT PRESENCE OF ULCERS  *URINARY PROBLEMS  *BOWEL PROBLEMS  UNUSUAL RASH Items with * indicate a potential emergency and should be followed up as soon as possible.  Feel free to call the clinic should you have any questions or concerns. The clinic phone number is [phone].  Please show the [place] at check-in to the Emergency Department and triage nurse.

## 2018-02-12 NOTE — Assessment & Plan Note (Signed)
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  Treatment plan: 1.Neoadjuvant chemotherapy with [place] Perjetafollowed by Herceptin and Perjetamaintenance for 1 year 2.breast conserving surgery with sentinel lymph node biopsy 3.Radiation therapy 4.Adjuvant antiestrogen therapy -------------------------------------------------------------------------------------------------------------------------------- Current Treatment: Cycle4TCHP   Chemo Toxicities: 1.Diarrhea 1-2 times per day well controlled with Imodium 2.change in taste 3.Fatigue from chemo 4.Mild bone pain from Neulasta 5 .  Nausea: I instructed her to take Compazine in addition to the Zofran. 6.  Chemotherapy-induced anemia: Monitoring closely today's hemoglobin is 10.6  Blood work was reviewed RTC in3weeks for cycle 5

## 2018-02-14 ENCOUNTER — Inpatient Hospital Stay: Payer: BLUE CROSS/BLUE SHIELD

## 2018-02-14 VITALS — BP 140/73 | HR 70 | Temp 98.8°F | Resp 17

## 2018-02-14 DIAGNOSIS — Z5189 Encounter for other specified aftercare: Secondary | ICD-10-CM | POA: Diagnosis not present

## 2018-02-14 DIAGNOSIS — C50411 Malignant neoplasm of upper-outer quadrant of right female breast: Secondary | ICD-10-CM | POA: Diagnosis not present

## 2018-02-14 DIAGNOSIS — Z5111 Encounter for antineoplastic chemotherapy: Secondary | ICD-10-CM | POA: Diagnosis not present

## 2018-02-14 DIAGNOSIS — Z170 Estrogen receptor positive status [ER+]: Secondary | ICD-10-CM

## 2018-02-14 DIAGNOSIS — Z79899 Other long term (current) drug therapy: Secondary | ICD-10-CM | POA: Diagnosis not present

## 2018-02-14 DIAGNOSIS — Z5112 Encounter for antineoplastic immunotherapy: Secondary | ICD-10-CM | POA: Diagnosis not present

## 2018-02-14 DIAGNOSIS — D6481 Anemia due to antineoplastic chemotherapy: Secondary | ICD-10-CM | POA: Diagnosis not present

## 2018-02-14 DIAGNOSIS — R530 Neoplastic (malignant) related fatigue: Secondary | ICD-10-CM | POA: Diagnosis not present

## 2018-02-14 MED ORDER — PEGFILGRASTIM-CBQV 6 MG/0.6ML ~~LOC~~ SOSY
6.0000 mg | PREFILLED_SYRINGE | Freq: Once | SUBCUTANEOUS | Status: AC
  Administered 2018-02-14: 6 mg via SUBCUTANEOUS

## 2018-02-14 MED ORDER — PEGFILGRASTIM-CBQV 6 MG/0.6ML ~~LOC~~ SOSY
PREFILLED_SYRINGE | SUBCUTANEOUS | Status: AC
  Filled 2018-02-14: qty 0.6

## 2018-02-25 ENCOUNTER — Telehealth: Payer: Self-pay | Admitting: *Deleted

## 2018-02-25 NOTE — Telephone Encounter (Signed)
Sandra [name] [id number] Study Called patient to confirm her research study appointment on Monday 12/2 for Cardiac MRI at 10 am.  Informed her research nurse will meet her in lobby of cancer center after her MRI for the rest of the study activities.  Informed patient there is a new consent form to review and sign along with the addition of waist measurement for the study.  Patient agreed and verbalized understanding.  Thanked patient and look forward to seeing her on Monday. Foye Spurling, BSN, RN Clinical Research Nurse 02/25/2018 4:09 PM

## 2018-03-02 ENCOUNTER — Encounter: Payer: Self-pay | Admitting: *Deleted

## 2018-03-02 ENCOUNTER — Ambulatory Visit (HOSPITAL_COMMUNITY)
Admission: RE | Admit: 2018-03-02 | Discharge: 2018-03-02 | Disposition: A | Discharge status: Home / Self Care | Payer: BLUE CROSS/BLUE SHIELD | Source: Ambulatory Visit | Attending: Hematology and Oncology | Admitting: Hematology and Oncology

## 2018-03-02 DIAGNOSIS — Z006 Encounter for examination for normal comparison and control in clinical research program: Secondary | ICD-10-CM | POA: Insufficient documentation

## 2018-03-02 DIAGNOSIS — C50411 Malignant neoplasm of upper-outer quadrant of right female breast: Secondary | ICD-10-CM

## 2018-03-02 DIAGNOSIS — Z170 Estrogen receptor positive status [ER+]: Secondary | ICD-10-CM

## 2018-03-02 NOTE — Research (Addendum)
3 MONTH ACTIVITIES FOR [employer]BE[employer] ([id number]) STUDY; Cardiac MRI: Patient completed MRI this morning and then arrived to our clinic by herselfto complete some of the other 3 month study activities for [employer].Greeted patient in lobby, thanked her for coming in and escorted her to the Nurse Tech area of the Breast Clinic.  VS, Ht & Wt: Obtained patient's heightandweight without shoes and without heavy clothingper protocol. Blood pressure and heart rate also obtained per protocol after allowing patient to sit/rest for 5 minutes and 1 minute rest between readings. BMI calculated using NIH BMI calculatorwebsite provided by study = 26.8.  Re-Consent PVD 11/25/17 Amendment 3: Met with patient in private conference room and reviewed new consent form using redlined tracked changes form to inform patient of all changes from previously signed consent form.  Patient denied any questions and signed and dated consent form for PVD 11/25/17.  Patient agreed to optional use of her samples for future research.  She did not agree to text messages or provide her cell phone number to receive text messages from the study. Copy of signed consent given to patient for her records.  Concomitant Medication Review:  Reviewed her current medication list and updated medication list.  Waist Measurement: Waist measured per instructions on CRF 13 = 36 inches.  Neurocognitive Testing: Neurocognitive testing was administered by research assistant Farris Has.  Physical Functions Testing: Patientcompleted her physical functions testing which included 6-Minute Walk Test, Disability Measures, and SPPB with assistance Mauri Reading, research nurse,without difficulty.  Research Labs: Reminded patient research labs will be drawn on her next lab appointment prior to starting treatment this Thursday 12/5.  Instructed patient to fast for at least 3 hours before the lab appointment. OK to drink water. She verbalized understanding.  PROs:  Patient prefers to complete studyquestionnaires while she is here getting chemotherapy on Thursday to save time today.  Will plan to give questionnaires to patient on Thursday.   Thanked patient for her time and participation today. Encouraged her to call research nurse if any questions or concerns before next contact.   Informed patient she will receive a $25 gift card after she completes blood draw and questionaires on Thursday.  Patient verbalized understanding.  Foye Spurling, BSN, RN Clinical Research Nurse 03/02/2018 12:24 PM

## 2018-03-04 ENCOUNTER — Telehealth: Payer: Self-pay | Admitting: *Deleted

## 2018-03-04 NOTE — Progress Notes (Signed)
Patient Care Team: Briscoe Deutscher, DO as PCP - General (Family Medicine) Macario Carls, MD as Referring Physician (Specialist) Erroll Luna, MD as Consulting Physician (General Surgery) Nicholas Lose, MD as Consulting Physician (Hematology and Oncology) Kyung Rudd, MD as Consulting Physician (Radiation Oncology)  DIAGNOSIS:    ICD-10-CM   1. Malignant neoplasm of upper-outer quadrant of right breast in female, estrogen receptor positive ([place]) C50.411 prochlorperazine (COMPAZINE) 10 MG tablet   Z17.0     SUMMARY OF ONCOLOGIC HISTORY:   Malignant neoplasm of upper-outer quadrant of right breast in female, estrogen receptor positive ([place])   11/11/2017 Initial Diagnosis    Palpable right breast mass at 10 o'clock position 1.7 cm, axilla negative, biopsy revealed grade 3 IDC ER 60%, PR 10%, Ki-67 70%, HER-2 positive, T1 CN 0 stage Ia    11/19/2017 Cancer Staging    Staging form: Breast, AJCC 8th Edition - Clinical: Stage IB (cT2, cN0, cM0, G3, ER+, PR+, HER2+) - Signed by Nicholas Lose, MD on 12/04/2017    11/25/2017 Breast MRI    4.4 x 2.8 x 2.1 cm right breast malignancy UOQ, second focus 0.8 cm (biopsy planned for 12/09/2017)    12/04/2017 -  Neo-Adjuvant Chemotherapy    Initial plan was with Taxol Herceptin but it was switched to [facility] Perjeta every 3 week x6 followed by Herceptin plus/minus Perjeta maintenance when the breast MRI showed more extensive disease.    01/04/2018 Genetic Testing    Negative genetic testing on the multi-cancer panel.  The Multi-Gene Panel offered by [facility] includes sequencing and/or deletion duplication testing of the following 84 genes: AIP, ALK, APC, ATM, AXIN2,BAP1,  BARD1, BLM, BMPR1A, BRCA1, BRCA2, BRIP1, CASR, CDC73, CDH1, CDK4, CDKN1B, CDKN1C, CDKN2A (p14ARF), CDKN2A (p16INK4a), CEBPA, CHEK2, CTNNA1, DICER1, DIS3L2, EGFR (c.2369C>T, p.Thr790Met variant only), EPCAM (Deletion/duplication testing only), FH, FLCN, GATA2, GPC3, GREM1 (Promoter region  deletion/duplication testing only), HOXB13 (c.251G>A, p.Gly84Glu), HRAS, KIT, MAX, MEN1, MET, MITF (c.952G>A, p.Glu318Lys variant only), MLH1, MSH2, MSH3, MSH6, MUTYH, NBN, NF1, NF2, NTHL1, PALB2, PDGFRA, PHOX2B, PMS2, POLD1, POLE, POT1, PRKAR1A, PTCH1, PTEN, RAD50, RAD51C, RAD51D, RB1, RECQL4, RET, RUNX1, SDHAF2, SDHA (sequence changes only), SDHB, SDHC, SDHD, SMAD4, SMARCA4, SMARCB1, SMARCE1, STK11, SUFU, TERC, TERT, TMEM127, TP53, TSC1, TSC2, VHL, WRN and WT1.  The report date is January 04, 2018.     CHIEF COMPLIANT: Cycle 5 of TCH and Perjeta   INTERVAL HISTORY: Sandra [name] is a 52 y.o. with above-mentioned history of triple positive right breast cancer who is here to receive her fifth cycle of [place] and Perjeta. She presents to the clinic today alone. She notes her last treatment went well. She reports she does not have nausea following treatment as long as she takes medication. She notes 1-2 episode of diarrhea while taking imodium twice daily. She reports her tongue "feels like sand paper" and the roof of her mouth feels "like a sweater", which makes it difficult to eat and she has lost 10lbs in the last month as a result. Her labs shows potassium 2.9, Hg 10.8.   REVIEW OF SYSTEMS:   Constitutional: Denies fevers, chills (+) abnormal weight loss Eyes: Denies blurriness of vision Ears, nose, mouth, throat, and face: Denies mucositis or sore throat (+) abnormal and dry mouth Respiratory: Denies cough, dyspnea or wheezes Cardiovascular: Denies palpitation, chest discomfort Gastrointestinal:  Denies nausea, heartburn (+) diarrhea, 1-2x daily Skin: Denies abnormal skin rashes Lymphatics: Denies new lymphadenopathy or easy bruising Neurological:Denies numbness, tingling or new weaknesses Behavioral/Psych: Mood is stable,  no new changes  Extremities: No lower extremity edema Breast: denies any pain or lumps or nodules in either breasts All other systems were reviewed with the patient and are  negative.  I have reviewed the past medical history, past surgical history, social history and family history with the patient and they are unchanged from previous note.  ALLERGIES:  has No Known Allergies.  MEDICATIONS:  Current Outpatient Medications  Medication Sig Dispense Refill  . betamethasone valerate ointment (VALISONE) 0.1 % Apply bid for 1-2 weeks as needed for flare of lichen sclerosis. 15 g 1  . dexamethasone (DECADRON) 4 MG tablet Take 1 tablet (4 mg total) by mouth daily. 1 tab day before chemo and 1 tab day after chemo 12 tablet 0  . ibuprofen (ADVIL,MOTRIN) 800 MG tablet Take 1 tablet (800 mg total) by mouth every 8 (eight) hours as needed. 30 tablet 0  . lidocaine-prilocaine (EMLA) cream   5  . LORazepam (ATIVAN) 1 MG tablet Take 1 tablet (1 mg total) by mouth every 8 (eight) hours as needed for anxiety (or nausea). 30 tablet 0  . ondansetron (ZOFRAN) 8 MG tablet Take 1 tablet (8 mg total) by mouth every 8 (eight) hours as needed for nausea. 30 tablet 3  . potassium chloride (MICRO-K) 10 MEQ CR capsule Take 2 capsules (20 mEq total) by mouth daily. 60 capsule 3  . prochlorperazine (COMPAZINE) 10 MG tablet Take 1 tablet (10 mg total) by mouth every 6 (six) hours as needed (Nausea or vomiting). 30 tablet 1  . venlafaxine XR (EFFEXOR-XR) 75 MG 24 hr capsule Take 1 capsule (75 mg total) by mouth daily. 90 capsule 3   No current facility-administered medications for this visit.     PHYSICAL EXAMINATION: ECOG PERFORMANCE STATUS: 1 - Symptomatic but completely ambulatory  Vitals:   03/05/18 1116  BP: 119/77  Pulse: 99  Resp: 17  Temp: 98.3 F (36.8 C)  SpO2: 100%   Filed Weights   03/05/18 1116  Weight: 190 lb 11.2 oz (86.5 kg)    GENERAL:alert, no distress and comfortable SKIN: skin color, texture, turgor are normal, no rashes or significant lesions EYES: normal, Conjunctiva are pink and non-injected, sclera clear OROPHARYNX:no exudate, no erythema and lips,  buccal mucosa, and tongue normal  NECK: supple, thyroid normal size, non-tender, without nodularity LYMPH:  no palpable lymphadenopathy in the cervical, axillary or inguinal LUNGS: clear to auscultation and percussion with normal breathing effort HEART: regular rate & rhythm and no murmurs and no lower extremity edema ABDOMEN:abdomen soft, non-tender and normal bowel sounds MUSCULOSKELETAL:no cyanosis of digits and no clubbing  NEURO: alert & oriented x 3 with fluent speech, no focal motor/sensory deficits EXTREMITIES: No lower extremity edema  LABORATORY DATA:  I have reviewed the data as listed CMP Latest Ref Rng & Units 03/05/2018 02/12/2018 01/22/2018  Glucose 70 - 99 mg/dL 87 83 95  BUN 6 - 20 mg/dL '7 6 9  ' Creatinine 0.44 - 1.00 mg/dL 0.79 0.77 0.71  Sodium 135 - 145 mmol/L 140 142 141  Potassium 3.5 - 5.1 mmol/L 2.9(LL) 3.0(LL) 3.4(L)  Chloride 98 - 111 mmol/L 103 103 105  CO2 22 - 32 mmol/L '27 29 26  ' Calcium 8.9 - 10.3 mg/dL 9.3 9.2 9.2  Total Protein 6.5 - 8.1 g/dL 6.6 6.5 6.7  Total Bilirubin 0.3 - 1.2 mg/dL 0.4 0.3 0.3  Alkaline Phos 38 - 126 U/L 62 65 70  AST 15 - 41 U/L 15 13(L) 16  ALT 0 -  44 U/L '12 15 23    ' Lab Results  Component Value Date   WBC 5.3 03/05/2018   HGB 10.8 (L) 03/05/2018   HCT 32.2 (L) 03/05/2018   MCV 92.5 03/05/2018   PLT 168 03/05/2018   NEUTROABS 2.8 03/05/2018    ASSESSMENT & PLAN:  Malignant neoplasm of upper-outer quadrant of right breast in female, estrogen receptor positive ([place]) 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  Treatment  plan: 1.Neoadjuvant chemotherapy with [place] Perjetafollowed by Herceptin and Perjetamaintenance for 1 year 2.breast conserving surgery with sentinel lymph node biopsy 3.Radiation therapy 4.Adjuvant antiestrogen therapy -------------------------------------------------------------------------------------------------------------------------------- Current Treatment: Cycle5TCHP  Chemo Toxicities: 1.Diarrhea 1-2 times per day well controlled with Imodium 2.change in taste 3.Fatigue from chemo 4.Mild bone pain from Neulasta 5 .Nausea: Controlled by taking Compazine every morning. 6.Chemotherapy-induced anemia: Monitoring closely today's hemoglobin is 10.8 7.  Depression: On Effexor 8.  Hypokalemia: I sent a prescription for Micro-K 20 mEq daily.  She will get 40 mEq in the infusion today.  Blood work was reviewed Breast MRI will be scheduled for 03/27/2018 I sent a message to Dr. Brantley Stage to see the patient after the breast MRI.  RTC in3weeks for cycle6    No orders of the defined types were placed in this encounter.  The patient has a good understanding of the overall plan. she agrees with it. she will call with any problems that may develop before the next visit here.  Nicholas Lose, MD 03/05/2018   I, Cloyde Reams Dorshimer, am acting as scribe for Nicholas Lose, MD.  I have reviewed the above documentation for accuracy and completeness, and I agree with the above.

## 2018-03-04 NOTE — Telephone Encounter (Signed)
LVM for patient reminding her to fast x 3 hrs before research labs tomorrow.  Also reminded her I will bring her questionnaires to complete during her treatment tomorrow.  Asked patient to call back if any questions or concerns.  Foye Spurling, BSN, RN Clinical Research Nurse 03/04/2018 4:56 PM

## 2018-03-05 ENCOUNTER — Inpatient Hospital Stay (HOSPITAL_BASED_OUTPATIENT_CLINIC_OR_DEPARTMENT_OTHER): Payer: BLUE CROSS/BLUE SHIELD | Admitting: Hematology and Oncology

## 2018-03-05 ENCOUNTER — Encounter: Payer: Self-pay | Admitting: *Deleted

## 2018-03-05 ENCOUNTER — Other Ambulatory Visit: Payer: Self-pay

## 2018-03-05 ENCOUNTER — Inpatient Hospital Stay: Payer: BLUE CROSS/BLUE SHIELD

## 2018-03-05 ENCOUNTER — Telehealth: Payer: Self-pay | Admitting: Hematology and Oncology

## 2018-03-05 ENCOUNTER — Inpatient Hospital Stay: Payer: BLUE CROSS/BLUE SHIELD | Attending: Hematology and Oncology

## 2018-03-05 DIAGNOSIS — Z5112 Encounter for antineoplastic immunotherapy: Secondary | ICD-10-CM | POA: Insufficient documentation

## 2018-03-05 DIAGNOSIS — D6481 Anemia due to antineoplastic chemotherapy: Secondary | ICD-10-CM | POA: Insufficient documentation

## 2018-03-05 DIAGNOSIS — Z5189 Encounter for other specified aftercare: Secondary | ICD-10-CM | POA: Diagnosis not present

## 2018-03-05 DIAGNOSIS — C50411 Malignant neoplasm of upper-outer quadrant of right female breast: Secondary | ICD-10-CM

## 2018-03-05 DIAGNOSIS — E876 Hypokalemia: Secondary | ICD-10-CM | POA: Diagnosis not present

## 2018-03-05 DIAGNOSIS — R439 Unspecified disturbances of smell and taste: Secondary | ICD-10-CM

## 2018-03-05 DIAGNOSIS — Z79899 Other long term (current) drug therapy: Secondary | ICD-10-CM

## 2018-03-05 DIAGNOSIS — Z5111 Encounter for antineoplastic chemotherapy: Secondary | ICD-10-CM | POA: Insufficient documentation

## 2018-03-05 DIAGNOSIS — Z170 Estrogen receptor positive status [ER+]: Secondary | ICD-10-CM

## 2018-03-05 DIAGNOSIS — F329 Major depressive disorder, single episode, unspecified: Secondary | ICD-10-CM | POA: Diagnosis not present

## 2018-03-05 DIAGNOSIS — Z006 Encounter for examination for normal comparison and control in clinical research program: Secondary | ICD-10-CM

## 2018-03-05 DIAGNOSIS — R634 Abnormal weight loss: Secondary | ICD-10-CM

## 2018-03-05 DIAGNOSIS — R197 Diarrhea, unspecified: Secondary | ICD-10-CM

## 2018-03-05 DIAGNOSIS — Z5181 Encounter for therapeutic drug level monitoring: Secondary | ICD-10-CM

## 2018-03-05 DIAGNOSIS — Z95828 Presence of other vascular implants and grafts: Secondary | ICD-10-CM

## 2018-03-05 LAB — RESEARCH LABS

## 2018-03-05 LAB — CBC WITH DIFFERENTIAL (CANCER CENTER ONLY)
Abs Immature Granulocytes: 0.02 10*3/uL (ref 0.00–0.07)
Basophils Absolute: 0 10*3/uL (ref 0.0–0.1)
Basophils Relative: 0 %
Eosinophils Absolute: 0 10*3/uL (ref 0.0–0.5)
Eosinophils Relative: 0 %
HCT: 32.2 % — ABNORMAL LOW (ref 36.0–46.0)
Hemoglobin: 10.8 g/dL — ABNORMAL LOW (ref 12.0–15.0)
Immature Granulocytes: 0 %
Lymphocytes Relative: 34 %
Lymphs Abs: 1.8 10*3/uL (ref 0.7–4.0)
MCH: 31 pg (ref 26.0–34.0)
MCHC: 33.5 g/dL (ref 30.0–36.0)
MCV: 92.5 fL (ref 80.0–100.0)
Monocytes Absolute: 0.7 10*3/uL (ref 0.1–1.0)
Monocytes Relative: 13 %
Neutro Abs: 2.8 10*3/uL (ref 1.7–7.7)
Neutrophils Relative %: 53 %
Platelet Count: 168 10*3/uL (ref 150–400)
RBC: 3.48 MIL/uL — ABNORMAL LOW (ref 3.87–5.11)
RDW: 16.2 % — ABNORMAL HIGH (ref 11.5–15.5)
WBC Count: 5.3 10*3/uL (ref 4.0–10.5)
nRBC: 0 % (ref 0.0–0.2)

## 2018-03-05 LAB — CMP (CANCER CENTER ONLY)
ALT: 12 U/L (ref 0–44)
AST: 15 U/L (ref 15–41)
Albumin: 3.7 g/dL (ref 3.5–5.0)
Alkaline Phosphatase: 62 U/L (ref 38–126)
Anion gap: 10 (ref 5–15)
BUN: 7 mg/dL (ref 6–20)
CO2: 27 mmol/L (ref 22–32)
Calcium: 9.3 mg/dL (ref 8.9–10.3)
Chloride: 103 mmol/L (ref 98–111)
Creatinine: 0.79 mg/dL (ref 0.44–1.00)
GFR, Est AFR Am: >60 mL/min (ref >60)
GFR, Estimated: >60 mL/min (ref >60)
Glucose, Bld: 87 mg/dL (ref 70–99)
Potassium: 2.9 mmol/L — CL (ref 3.5–5.1)
Sodium: 140 mmol/L (ref 135–145)
Total Bilirubin: 0.4 mg/dL (ref 0.3–1.2)
Total Protein: 6.6 g/dL (ref 6.5–8.1)

## 2018-03-05 MED ORDER — SODIUM CHLORIDE 0.9 % IV SOLN
420.0000 mg | Freq: Once | INTRAVENOUS | Status: AC
  Administered 2018-03-05: 420 mg via INTRAVENOUS
  Filled 2018-03-05: qty 14

## 2018-03-05 MED ORDER — SODIUM CHLORIDE 0.9 % IV SOLN
700.0000 mg | Freq: Once | INTRAVENOUS | Status: DC
  Administered 2018-03-05: 700 mg via INTRAVENOUS
  Filled 2018-03-05: qty 70

## 2018-03-05 MED ORDER — SODIUM CHLORIDE 0.9 % IV SOLN
75.0000 mg/m2 | Freq: Once | INTRAVENOUS | Status: DC
  Administered 2018-03-05: 170 mg via INTRAVENOUS
  Filled 2018-03-05: qty 17

## 2018-03-05 MED ORDER — SODIUM CHLORIDE 0.9 % IV SOLN
Freq: Once | INTRAVENOUS | Status: AC
  Administered 2018-03-05: 13:00:00 via INTRAVENOUS
  Filled 2018-03-05: qty 250

## 2018-03-05 MED ORDER — DIPHENHYDRAMINE HCL 25 MG PO CAPS: 50.0000 mg | ORAL_CAPSULE | Freq: Once | ORAL | Status: DC

## 2018-03-05 MED ORDER — SODIUM CHLORIDE 0.9% FLUSH
10.0000 mL | INTRAVENOUS | Status: DC | PRN
  Administered 2018-03-05: 10 mL
  Filled 2018-03-05: qty 10

## 2018-03-05 MED ORDER — HEPARIN SOD (PORK) LOCK FLUSH 100 UNIT/ML IV SOLN
500.0000 [IU] | Freq: Once | INTRAVENOUS | Status: DC | PRN
  Administered 2018-03-05: 500 [IU]
  Filled 2018-03-05: qty 5

## 2018-03-05 MED ORDER — PROCHLORPERAZINE MALEATE 10 MG PO TABS: 10.0000 mg | ORAL_TABLET | Freq: Four times a day (QID) | ORAL | 1 refills | Status: DC | PRN

## 2018-03-05 MED ORDER — POTASSIUM CHLORIDE CRYS ER 20 MEQ PO TBCR
40.0000 meq | EXTENDED_RELEASE_TABLET | Freq: Once | ORAL | Status: AC
  Administered 2018-03-05: 40 meq via ORAL

## 2018-03-05 MED ORDER — TRASTUZUMAB CHEMO 150 MG IV SOLR
525.0000 mg | Freq: Once | INTRAVENOUS | Status: AC
  Administered 2018-03-05: 525 mg via INTRAVENOUS
  Filled 2018-03-05: qty 25

## 2018-03-05 MED ORDER — POTASSIUM CHLORIDE CRYS ER 20 MEQ PO TBCR
EXTENDED_RELEASE_TABLET | ORAL | Status: AC
  Filled 2018-03-05: qty 2

## 2018-03-05 MED ORDER — PALONOSETRON HCL INJECTION 0.25 MG/5ML
INTRAVENOUS | Status: AC
  Filled 2018-03-05: qty 5

## 2018-03-05 MED ORDER — ACETAMINOPHEN 325 MG PO TABS
ORAL_TABLET | ORAL | Status: AC
  Filled 2018-03-05: qty 2

## 2018-03-05 MED ORDER — PALONOSETRON HCL INJECTION 0.25 MG/5ML
0.2500 mg | Freq: Once | INTRAVENOUS | Status: AC
  Administered 2018-03-05: 0.25 mg via INTRAVENOUS

## 2018-03-05 MED ORDER — POTASSIUM CHLORIDE ER 10 MEQ PO CPCR: 20.0000 meq | ORAL_CAPSULE | Freq: Every day | ORAL | 3 refills | Status: DC

## 2018-03-05 MED ORDER — SODIUM CHLORIDE 0.9 % IV SOLN
Freq: Once | INTRAVENOUS | Status: AC
  Administered 2018-03-05: 13:00:00 via INTRAVENOUS
  Filled 2018-03-05: qty 5

## 2018-03-05 MED ORDER — DIPHENHYDRAMINE HCL 25 MG PO CAPS
ORAL_CAPSULE | ORAL | Status: AC
  Filled 2018-03-05: qty 2

## 2018-03-05 MED ORDER — ACETAMINOPHEN 325 MG PO TABS: 650.0000 mg | ORAL_TABLET | Freq: Once | ORAL | Status: DC

## 2018-03-05 NOTE — Telephone Encounter (Signed)
Per 1/25 no los °

## 2018-03-05 NOTE — Progress Notes (Signed)
Spoke w/ May, adjust Herceptin dose for weight loss per MD. Orders updated.  Demetrius Charity, PharmD, [facility] Pharmacist Pharmacy Phone: [phone] 03/05/2018

## 2018-03-05 NOTE — Addendum Note (Signed)
Addended by: Caryl Comes E on: 03/05/2018 01:49 PM   Modules accepted: Orders

## 2018-03-05 NOTE — Research (Signed)
[place] ([id number]) STUDY PROs: Patient completed questionnaires for her 3 month visit during her treatment today. Research nurse collected them and checked them for completeness.  Section E. was reviewed for depression score which =  17.  Dr. Lindi Adie notified.  Patient is currently undergoing treatment for depression.  Research Labs: Collected today during regular lab appointment. Thanked patient for completing her 3 month study activities and gave her $76 [employer] gift card. Informed patient next study activities are due in 9 months (one year after starting chemotherapy).  Research nurse will call her about 2 months prior to due date to schedule.  Encouraged patient to contact research nurse if any questions or concerns prior to next appointment.  She verbalized understanding.  Foye Spurling, BSN, RN Clinical Research Nurse 03/05/2018 2:45PM

## 2018-03-05 NOTE — Progress Notes (Signed)
Echo ordered.

## 2018-03-05 NOTE — Progress Notes (Signed)
Due to pt having her visit accidentally signed before her infusion was complete there was some difficulty administering her carboplatin as her MAR stated that the orders were closed/out of date d/t the patient being 'discharged'.  RN Learta Codding was able to successfully administer carboplatin dose at an appropriate time following her taxotere infusion, however the [facility] required some overriding.  This was authorized by Pharmacist Eliezer Lofts and Banks.  Carbo was dual signed off by [employer], pt verified with two patient identifiers.

## 2018-03-05 NOTE — Progress Notes (Signed)
Per Dr.Gudena, pt to receive 65meq of potassium PO today for K level of 2.9

## 2018-03-05 NOTE — Patient Instructions (Signed)
[facility] Discharge Instructions for Patients Receiving Chemotherapy  Today you received the following chemotherapy agents: Herceptin, Perjeta, Taxotere and Carboplatin.  To help prevent nausea and vomiting after your treatment, we encourage you to take your nausea medication as prescribed.  If you develop nausea and vomiting that is not controlled by your nausea medication, call the clinic.   BELOW ARE SYMPTOMS THAT SHOULD BE REPORTED IMMEDIATELY:  *FEVER GREATER THAN 100.5 F  *CHILLS WITH OR WITHOUT FEVER  NAUSEA AND VOMITING THAT IS NOT CONTROLLED WITH YOUR NAUSEA MEDICATION  *UNUSUAL SHORTNESS OF BREATH  *UNUSUAL BRUISING OR BLEEDING  TENDERNESS IN MOUTH AND THROAT WITH OR WITHOUT PRESENCE OF ULCERS  *URINARY PROBLEMS  *BOWEL PROBLEMS  UNUSUAL RASH Items with * indicate a potential emergency and should be followed up as soon as possible.  Feel free to call the clinic should you have any questions or concerns. The clinic phone number is [phone].  Please show the [place] at check-in to the Emergency Department and triage nurse.

## 2018-03-05 NOTE — Assessment & Plan Note (Signed)
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  Treatment plan: 1.Neoadjuvant chemotherapy with TCH Perjetafollowed by Herceptin and Perjetamaintenance for 1 year 2.breast conserving surgery with sentinel lymph node biopsy 3.Radiation therapy 4.Adjuvant antiestrogen therapy -------------------------------------------------------------------------------------------------------------------------------- Current Treatment: Cycle5TCHP  Chemo Toxicities: 1.Diarrhea 1-2 times per day well controlled with Imodium 2.change in taste 3.Fatigue from chemo 4.Mild bone pain from Neulasta 5 .Nausea: I instructed her to take Compazine in addition to the Zofran. 6.Chemotherapy-induced anemia: Monitoring closely today's hemoglobin is 10.1 7.  Depression: Patient is seeking for counselors help getting through with her treatment.  We will try to arrange for that.  Blood work was reviewed RTC in3weeks for cycle6 

## 2018-03-07 ENCOUNTER — Inpatient Hospital Stay: Payer: BLUE CROSS/BLUE SHIELD

## 2018-03-07 VITALS — BP 132/77 | HR 66 | Temp 98.7°F | Resp 17

## 2018-03-07 DIAGNOSIS — Z5189 Encounter for other specified aftercare: Secondary | ICD-10-CM | POA: Diagnosis not present

## 2018-03-07 DIAGNOSIS — Z170 Estrogen receptor positive status [ER+]: Secondary | ICD-10-CM

## 2018-03-07 DIAGNOSIS — Z5112 Encounter for antineoplastic immunotherapy: Secondary | ICD-10-CM | POA: Diagnosis not present

## 2018-03-07 DIAGNOSIS — E876 Hypokalemia: Secondary | ICD-10-CM | POA: Diagnosis not present

## 2018-03-07 DIAGNOSIS — C50411 Malignant neoplasm of upper-outer quadrant of right female breast: Secondary | ICD-10-CM

## 2018-03-07 DIAGNOSIS — Z5111 Encounter for antineoplastic chemotherapy: Secondary | ICD-10-CM | POA: Diagnosis not present

## 2018-03-07 DIAGNOSIS — D6481 Anemia due to antineoplastic chemotherapy: Secondary | ICD-10-CM | POA: Diagnosis not present

## 2018-03-07 MED ORDER — PEGFILGRASTIM-CBQV 6 MG/0.6ML ~~LOC~~ SOSY
6.0000 mg | PREFILLED_SYRINGE | Freq: Once | SUBCUTANEOUS | Status: AC
  Administered 2018-03-07: 6 mg via SUBCUTANEOUS

## 2018-03-07 MED ORDER — PEGFILGRASTIM-CBQV 6 MG/0.6ML ~~LOC~~ SOSY
PREFILLED_SYRINGE | SUBCUTANEOUS | Status: AC
  Filled 2018-03-07: qty 0.6

## 2018-03-11 ENCOUNTER — Ambulatory Visit (HOSPITAL_COMMUNITY)
Admission: RE | Admit: 2018-03-11 | Discharge: 2018-03-11 | Disposition: A | Discharge status: Home / Self Care | Payer: BLUE CROSS/BLUE SHIELD | Source: Ambulatory Visit | Attending: Hematology and Oncology | Admitting: Hematology and Oncology

## 2018-03-11 DIAGNOSIS — I071 Rheumatic tricuspid insufficiency: Secondary | ICD-10-CM | POA: Diagnosis not present

## 2018-03-11 DIAGNOSIS — Z79899 Other long term (current) drug therapy: Secondary | ICD-10-CM | POA: Diagnosis not present

## 2018-03-11 DIAGNOSIS — Z5181 Encounter for therapeutic drug level monitoring: Secondary | ICD-10-CM | POA: Insufficient documentation

## 2018-03-11 DIAGNOSIS — I313 Pericardial effusion (noninflammatory): Secondary | ICD-10-CM | POA: Diagnosis not present

## 2018-03-11 NOTE — Progress Notes (Signed)
  Echocardiogram 2D Echocardiogram has been performed.  Sandra [name] 03/11/2018, 11:15 AM

## 2018-03-23 ENCOUNTER — Other Ambulatory Visit: Payer: Self-pay | Admitting: Hematology and Oncology

## 2018-03-25 NOTE — Assessment & Plan Note (Signed)
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  Treatment plan: 1.Neoadjuvant chemotherapy with [place] Perjetafollowed by Herceptin and Perjetamaintenance for 1 year 2.breast conserving surgery with sentinel lymph node biopsy 3.Radiation therapy 4.Adjuvant antiestrogen therapy -------------------------------------------------------------------------------------------------------------------------------- Current Treatment: Cycle6TCHP  Chemo Toxicities: 1.Diarrhea 1-2 times per day well controlled with Imodium 2.change in taste 3.Fatigue from chemo 4.Mild bone pain from Neulasta 5 .Nausea: Controlled by taking Compazine every morning. 6.Chemotherapy-induced anemia: Monitoring closely today's hemoglobin is 10.8 7.Depression: On Effexor 8.  Hypokalemia: I sent a prescription for Micro-K 20 mEq daily.  She will get 40 mEq in the infusion today.  Blood work was reviewed Breast MRI will be scheduled for 03/27/2018

## 2018-03-26 ENCOUNTER — Inpatient Hospital Stay: Payer: BLUE CROSS/BLUE SHIELD

## 2018-03-26 ENCOUNTER — Encounter: Payer: Self-pay | Admitting: *Deleted

## 2018-03-26 ENCOUNTER — Telehealth: Payer: Self-pay | Admitting: Hematology and Oncology

## 2018-03-26 ENCOUNTER — Inpatient Hospital Stay (HOSPITAL_BASED_OUTPATIENT_CLINIC_OR_DEPARTMENT_OTHER): Payer: BLUE CROSS/BLUE SHIELD | Admitting: Hematology and Oncology

## 2018-03-26 VITALS — HR 87

## 2018-03-26 DIAGNOSIS — R110 Nausea: Secondary | ICD-10-CM

## 2018-03-26 DIAGNOSIS — Z170 Estrogen receptor positive status [ER+]: Secondary | ICD-10-CM

## 2018-03-26 DIAGNOSIS — D6481 Anemia due to antineoplastic chemotherapy: Secondary | ICD-10-CM | POA: Diagnosis not present

## 2018-03-26 DIAGNOSIS — C50411 Malignant neoplasm of upper-outer quadrant of right female breast: Secondary | ICD-10-CM

## 2018-03-26 DIAGNOSIS — R530 Neoplastic (malignant) related fatigue: Secondary | ICD-10-CM

## 2018-03-26 DIAGNOSIS — E876 Hypokalemia: Secondary | ICD-10-CM | POA: Diagnosis not present

## 2018-03-26 DIAGNOSIS — F329 Major depressive disorder, single episode, unspecified: Secondary | ICD-10-CM

## 2018-03-26 DIAGNOSIS — Z5111 Encounter for antineoplastic chemotherapy: Secondary | ICD-10-CM | POA: Diagnosis not present

## 2018-03-26 DIAGNOSIS — Z5112 Encounter for antineoplastic immunotherapy: Secondary | ICD-10-CM | POA: Diagnosis not present

## 2018-03-26 DIAGNOSIS — Z5189 Encounter for other specified aftercare: Secondary | ICD-10-CM | POA: Diagnosis not present

## 2018-03-26 DIAGNOSIS — Z95828 Presence of other vascular implants and grafts: Secondary | ICD-10-CM

## 2018-03-26 LAB — CMP (CANCER CENTER ONLY)
ALT: 16 U/L (ref 0–44)
AST: 19 U/L (ref 15–41)
Albumin: 3.3 g/dL — ABNORMAL LOW (ref 3.5–5.0)
Alkaline Phosphatase: 49 U/L (ref 38–126)
Anion gap: 10 (ref 5–15)
BUN: 12 mg/dL (ref 6–20)
CO2: 24 mmol/L (ref 22–32)
Calcium: 8.8 mg/dL — ABNORMAL LOW (ref 8.9–10.3)
Chloride: 105 mmol/L (ref 98–111)
Creatinine: 0.74 mg/dL (ref 0.44–1.00)
GFR, Est AFR Am: >60 mL/min (ref >60)
GFR, Estimated: >60 mL/min (ref >60)
Glucose, Bld: 133 mg/dL — ABNORMAL HIGH (ref 70–99)
Potassium: 3.5 mmol/L (ref 3.5–5.1)
Sodium: 139 mmol/L (ref 135–145)
Total Bilirubin: 0.2 mg/dL — ABNORMAL LOW (ref 0.3–1.2)
Total Protein: 6.2 g/dL — ABNORMAL LOW (ref 6.5–8.1)

## 2018-03-26 LAB — CBC WITH DIFFERENTIAL (CANCER CENTER ONLY)
Abs Immature Granulocytes: 0.03 10*3/uL (ref 0.00–0.07)
Basophils Absolute: 0 10*3/uL (ref 0.0–0.1)
Basophils Relative: 0 %
Eosinophils Absolute: 0.1 10*3/uL (ref 0.0–0.5)
Eosinophils Relative: 1 %
HCT: 31 % — ABNORMAL LOW (ref 36.0–46.0)
Hemoglobin: 10.3 g/dL — ABNORMAL LOW (ref 12.0–15.0)
Immature Granulocytes: 0 %
Lymphocytes Relative: 16 %
Lymphs Abs: 1.1 10*3/uL (ref 0.7–4.0)
MCH: 32.4 pg (ref 26.0–34.0)
MCHC: 33.2 g/dL (ref 30.0–36.0)
MCV: 97.5 fL (ref 80.0–100.0)
Monocytes Absolute: 0.5 10*3/uL (ref 0.1–1.0)
Monocytes Relative: 7 %
Neutro Abs: 5 10*3/uL (ref 1.7–7.7)
Neutrophils Relative %: 76 %
Platelet Count: 180 10*3/uL (ref 150–400)
RBC: 3.18 MIL/uL — ABNORMAL LOW (ref 3.87–5.11)
RDW: 15.8 % — ABNORMAL HIGH (ref 11.5–15.5)
WBC Count: 6.7 10*3/uL (ref 4.0–10.5)
nRBC: 0 % (ref 0.0–0.2)

## 2018-03-26 MED ORDER — SODIUM CHLORIDE 0.9 % IV SOLN
75.0000 mg/m2 | Freq: Once | INTRAVENOUS | Status: AC
  Administered 2018-03-26: 150 mg via INTRAVENOUS
  Filled 2018-03-26: qty 15

## 2018-03-26 MED ORDER — TRASTUZUMAB CHEMO 150 MG IV SOLR
525.0000 mg | Freq: Once | INTRAVENOUS | Status: AC
  Administered 2018-03-26: 525 mg via INTRAVENOUS
  Filled 2018-03-26: qty 25

## 2018-03-26 MED ORDER — PALONOSETRON HCL INJECTION 0.25 MG/5ML
0.2500 mg | Freq: Once | INTRAVENOUS | Status: AC
  Administered 2018-03-26: 0.25 mg via INTRAVENOUS

## 2018-03-26 MED ORDER — SODIUM CHLORIDE 0.9 % IV SOLN
Freq: Once | INTRAVENOUS | Status: AC
  Administered 2018-03-26: 10:00:00 via INTRAVENOUS
  Filled 2018-03-26: qty 250

## 2018-03-26 MED ORDER — DIPHENHYDRAMINE HCL 25 MG PO CAPS: 50.0000 mg | ORAL_CAPSULE | Freq: Once | ORAL | Status: DC

## 2018-03-26 MED ORDER — HEPARIN SOD (PORK) LOCK FLUSH 100 UNIT/ML IV SOLN
500.0000 [IU] | Freq: Once | INTRAVENOUS | Status: AC | PRN
  Administered 2018-03-26: 500 [IU]
  Filled 2018-03-26: qty 5

## 2018-03-26 MED ORDER — SODIUM CHLORIDE 0.9 % IV SOLN
700.0000 mg | Freq: Once | INTRAVENOUS | Status: AC
  Administered 2018-03-26: 700 mg via INTRAVENOUS
  Filled 2018-03-26: qty 70

## 2018-03-26 MED ORDER — ACETAMINOPHEN 325 MG PO TABS: 650.0000 mg | ORAL_TABLET | Freq: Once | ORAL | Status: DC

## 2018-03-26 MED ORDER — SODIUM CHLORIDE 0.9 % IV SOLN
420.0000 mg | Freq: Once | INTRAVENOUS | Status: AC
  Administered 2018-03-26: 420 mg via INTRAVENOUS
  Filled 2018-03-26: qty 14

## 2018-03-26 MED ORDER — SODIUM CHLORIDE 0.9 % IV SOLN
Freq: Once | INTRAVENOUS | Status: AC
  Administered 2018-03-26: 10:00:00 via INTRAVENOUS
  Filled 2018-03-26: qty 5

## 2018-03-26 MED ORDER — SODIUM CHLORIDE 0.9% FLUSH
10.0000 mL | INTRAVENOUS | Status: DC | PRN
  Administered 2018-03-26: 10 mL
  Filled 2018-03-26: qty 10

## 2018-03-26 MED ORDER — PALONOSETRON HCL INJECTION 0.25 MG/5ML
INTRAVENOUS | Status: AC
  Filled 2018-03-26: qty 5

## 2018-03-26 MED ORDER — PANTOPRAZOLE SODIUM 40 MG PO TBEC: 40.0000 mg | DELAYED_RELEASE_TABLET | Freq: Every day | ORAL | 1 refills | Status: DC

## 2018-03-26 NOTE — Progress Notes (Signed)
Gave patient a copy of her research lab results today in infusion room.  Thanked patient for her participation and reminded her next study visit is due September 2020 and research nurse will call her about one month prior to schedule. She verbalized understanding.  Foye Spurling, BSN, RN Clinical Research Nurse 03/26/2018 10:29 AM

## 2018-03-26 NOTE — Telephone Encounter (Signed)
Scheduled appt per 12/26 los - per patient aware - no print out needed , my chart active.

## 2018-03-26 NOTE — Progress Notes (Signed)
Patient Care Team: Briscoe Deutscher, DO as PCP - General (Family Medicine) Macario Carls, MD as Referring Physician (Specialist) Erroll Luna, MD as Consulting Physician (General Surgery) Nicholas Lose, MD as Consulting Physician (Hematology and Oncology) Kyung Rudd, MD as Consulting Physician (Radiation Oncology)  DIAGNOSIS:  Encounter Diagnosis  Name Primary?  . Malignant neoplasm of upper-outer quadrant of right breast in female, estrogen receptor positive ([place])     SUMMARY OF ONCOLOGIC HISTORY:   Malignant neoplasm of upper-outer quadrant of right breast in female, estrogen receptor positive ([place])   11/11/2017 Initial Diagnosis    Palpable right breast mass at 10 o'clock position 1.7 cm, axilla negative, biopsy revealed grade 3 IDC ER 60%, PR 10%, Ki-67 70%, HER-2 positive, T1 CN 0 stage Ia    11/19/2017 Cancer Staging    Staging form: Breast, AJCC 8th Edition - Clinical: Stage IB (cT2, cN0, cM0, G3, ER+, PR+, HER2+) - Signed by Nicholas Lose, MD on 12/04/2017    11/25/2017 Breast MRI    4.4 x 2.8 x 2.1 cm right breast malignancy UOQ, second focus 0.8 cm (biopsy planned for 12/09/2017)    12/04/2017 -  Neo-Adjuvant Chemotherapy    Initial plan was with Taxol Herceptin but it was switched to [facility] Perjeta every 3 week x6 followed by Herceptin plus/minus Perjeta maintenance when the breast MRI showed more extensive disease.    01/04/2018 Genetic Testing    Negative genetic testing on the multi-cancer panel.  The Multi-Gene Panel offered by [facility] includes sequencing and/or deletion duplication testing of the following 84 genes: AIP, ALK, APC, ATM, AXIN2,BAP1,  BARD1, BLM, BMPR1A, BRCA1, BRCA2, BRIP1, CASR, CDC73, CDH1, CDK4, CDKN1B, CDKN1C, CDKN2A (p14ARF), CDKN2A (p16INK4a), CEBPA, CHEK2, CTNNA1, DICER1, DIS3L2, EGFR (c.2369C>T, p.Thr790Met variant only), EPCAM (Deletion/duplication testing only), FH, FLCN, GATA2, GPC3, GREM1 (Promoter region deletion/duplication testing only), HOXB13  (c.251G>A, p.Gly84Glu), HRAS, KIT, MAX, MEN1, MET, MITF (c.952G>A, p.Glu318Lys variant only), MLH1, MSH2, MSH3, MSH6, MUTYH, NBN, NF1, NF2, NTHL1, PALB2, PDGFRA, PHOX2B, PMS2, POLD1, POLE, POT1, PRKAR1A, PTCH1, PTEN, RAD50, RAD51C, RAD51D, RB1, RECQL4, RET, RUNX1, SDHAF2, SDHA (sequence changes only), SDHB, SDHC, SDHD, SMAD4, SMARCA4, SMARCB1, SMARCE1, STK11, SUFU, TERC, TERT, TMEM127, TP53, TSC1, TSC2, VHL, WRN and WT1.  The report date is January 04, 2018.     CHIEF COMPLIANT: Cycle 6 [facility] Perjeta  INTERVAL HISTORY: AURI Sandra [name] is a 52 year old above-mentioned history of HER-2 positive right breast cancer who is currently undergoing neoadjuvant chemotherapy with [place] and today is cycle 6 of her treatment.  She gets profound diarrhea for a week after chemo.  She gets fairly dehydrated after that she does better with decreased nausea and diarrhea and she is also taking oral potassium replacement therapy.  For the past week she has had excellent energy levels and was able to enjoy Christmas.  REVIEW OF SYSTEMS:   Constitutional: Denies fevers, chills or abnormal weight loss Eyes: Denies blurriness of vision Ears, nose, mouth, throat, and face: Denies mucositis or sore throat Respiratory: Denies cough, dyspnea or wheezes Cardiovascular: Denies palpitation, chest discomfort Gastrointestinal: Diarrhea Skin: Denies abnormal skin rashes Lymphatics: Denies new lymphadenopathy or easy bruising Neurological:Denies numbness, tingling or new weaknesses Behavioral/Psych: Mood is stable, no new changes  Extremities: No lower extremity edema  All other systems were reviewed with the patient and are negative.  I have reviewed the past medical history, past surgical history, social history and family history with the patient and they are unchanged from previous note.  ALLERGIES:  has No Known  Allergies.  MEDICATIONS:  Current Outpatient Medications  Medication Sig Dispense Refill  .  betamethasone valerate ointment (VALISONE) 0.1 % Apply bid for 1-2 weeks as needed for flare of lichen sclerosis. 15 g 1  . dexamethasone (DECADRON) 4 MG tablet Take 1 tablet (4 mg total) by mouth daily. 1 tab day before chemo and 1 tab day after chemo 12 tablet 0  . ibuprofen (ADVIL,MOTRIN) 800 MG tablet Take 1 tablet (800 mg total) by mouth every 8 (eight) hours as needed. 30 tablet 0  . lidocaine-prilocaine (EMLA) cream   5  . LORazepam (ATIVAN) 1 MG tablet Take 1 tablet (1 mg total) by mouth every 8 (eight) hours as needed for anxiety (or nausea). 30 tablet 0  . ondansetron (ZOFRAN) 8 MG tablet TAKE 1 TABLET (8 MG TOTAL) BY MOUTH 2 (TWO) TIMES DAILY AS NEEDED (NAUSEA OR VOMITING). 30 tablet 1  . potassium chloride (MICRO-K) 10 MEQ CR capsule Take 2 capsules (20 mEq total) by mouth daily. 60 capsule 3  . prochlorperazine (COMPAZINE) 10 MG tablet Take 1 tablet (10 mg total) by mouth every 6 (six) hours as needed (Nausea or vomiting). 30 tablet 1  . venlafaxine XR (EFFEXOR-XR) 75 MG 24 hr capsule Take 1 capsule (75 mg total) by mouth daily. 90 capsule 3   No current facility-administered medications for this visit.    Facility-Administered Medications Ordered in Other Visits  Medication Dose Route Frequency Provider Last Rate Last Dose  . sodium chloride flush (NS) 0.9 % injection 10 mL  10 mL Intracatheter PRN Nicholas Lose, MD   10 mL at 03/26/18 0818    PHYSICAL EXAMINATION: ECOG PERFORMANCE STATUS: 1 - Symptomatic but completely ambulatory  Vitals:   03/26/18 0915  BP: 124/72  Pulse: (!) 102  Resp: 18  Temp: 98.6 F (37 C)  SpO2: 100%   Filed Weights   03/26/18 0915  Weight: 190 lb 12.8 oz (86.5 kg)    GENERAL:alert, no distress and comfortable SKIN: skin color, texture, turgor are normal, no rashes or significant lesions EYES: normal, Conjunctiva are pink and non-injected, sclera clear OROPHARYNX:no exudate, no erythema and lips, buccal mucosa, and tongue normal  NECK:  supple, thyroid normal size, non-tender, without nodularity LYMPH:  no palpable lymphadenopathy in the cervical, axillary or inguinal LUNGS: clear to auscultation and percussion with normal breathing effort HEART: regular rate & rhythm and no murmurs and no lower extremity edema ABDOMEN:abdomen soft, non-tender and normal bowel sounds MUSCULOSKELETAL:no cyanosis of digits and no clubbing  NEURO: alert & oriented x 3 with fluent speech, no focal motor/sensory deficits EXTREMITIES: No lower extremity edema   LABORATORY DATA:  I have reviewed the data as listed CMP Latest Ref Rng & Units 03/26/2018 03/05/2018 02/12/2018  Glucose 70 - 99 mg/dL 133(H) 87 83  BUN 6 - 20 mg/dL '12 7 6  ' Creatinine 0.44 - 1.00 mg/dL 0.74 0.79 0.77  Sodium 135 - 145 mmol/L 139 140 142  Potassium 3.5 - 5.1 mmol/L 3.5 2.9(LL) 3.0(LL)  Chloride 98 - 111 mmol/L 105 103 103  CO2 22 - 32 mmol/L '24 27 29  ' Calcium 8.9 - 10.3 mg/dL 8.8(L) 9.3 9.2  Total Protein 6.5 - 8.1 g/dL 6.2(L) 6.6 6.5  Total Bilirubin 0.3 - 1.2 mg/dL 0.2(L) 0.4 0.3  Alkaline Phos 38 - 126 U/L 49 62 65  AST 15 - 41 U/L 19 15 13(L)  ALT 0 - 44 U/L '16 12 15    ' Lab Results  Component Value Date  WBC 6.7 03/26/2018   HGB 10.3 (L) 03/26/2018   HCT 31.0 (L) 03/26/2018   MCV 97.5 03/26/2018   PLT 180 03/26/2018   NEUTROABS 5.0 03/26/2018    ASSESSMENT & PLAN:  Malignant neoplasm of upper-outer quadrant of right breast in female, estrogen receptor positive ([place]) 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  Treatment plan: 1.Neoadjuvant chemotherapy  with [place] Perjetafollowed by Herceptin and Perjetamaintenance for 1 year 2.breast conserving surgery with sentinel lymph node biopsy 3.Radiation therapy 4.Adjuvant antiestrogen therapy -------------------------------------------------------------------------------------------------------------------------------- Current Treatment: Cycle6TCHP  Chemo Toxicities: 1.Diarrhea 1-2 times per day well controlled with Imodium 2.change in taste 3.Fatigue from chemo 4.Mild bone pain from Neulasta 5 .Nausea: Controlled by taking Compazine every morning. 6.Chemotherapy-induced anemia: Monitoring closely today's hemoglobin is 10.8 7.Depression: On Effexor 8.  Hypokalemia: I sent a prescription for Micro-K 20 mEq daily.  She will get 40 mEq in the infusion today.  Blood work was reviewed Breast MRI will be scheduled for 03/27/2018 We will get her in for 1 week after today's chemo for IV fluids She will be scheduled for Herceptin Perjeta maintenance.  This will however be changed depending upon the pathological results from surgery. I will see the patient after surgery to discuss the pathology report.  No orders of the defined types were placed in this encounter.  The patient has a good understanding of the overall plan. she agrees with it. she will call with any problems that may develop before the next visit here.   Harriette Ohara, MD 03/26/18

## 2018-03-26 NOTE — Patient Instructions (Signed)

## 2018-03-26 NOTE — Patient Instructions (Signed)
[place] Discharge Instructions for Patients Receiving Chemotherapy  Today you received the following chemotherapy agents: trastuzumab (Herceptin), pertuzumab (Perjeta), docetaxel (Taxotere) and carboplatin (Paraplatin)  To help prevent nausea and vomiting after your treatment, we encourage you to take your nausea medication as prescribed.  If you develop nausea and vomiting that is not controlled by your nausea medication, call the clinic.   BELOW ARE SYMPTOMS THAT SHOULD BE REPORTED IMMEDIATELY:  *FEVER GREATER THAN 100.5 F  *CHILLS WITH OR WITHOUT FEVER  NAUSEA AND VOMITING THAT IS NOT CONTROLLED WITH YOUR NAUSEA MEDICATION  *UNUSUAL SHORTNESS OF BREATH  *UNUSUAL BRUISING OR BLEEDING  TENDERNESS IN MOUTH AND THROAT WITH OR WITHOUT PRESENCE OF ULCERS  *URINARY PROBLEMS  *BOWEL PROBLEMS  UNUSUAL RASH Items with * indicate a potential emergency and should be followed up as soon as possible.  Feel free to call the clinic should you have any questions or concerns. The clinic phone number is [phone].  Please show the [place] at check-in to the Emergency Department and triage nurse.

## 2018-03-27 ENCOUNTER — Ambulatory Visit (HOSPITAL_COMMUNITY)
Admission: RE | Admit: 2018-03-27 | Discharge: 2018-03-27 | Disposition: A | Discharge status: Home / Self Care | Payer: BLUE CROSS/BLUE SHIELD | Source: Ambulatory Visit | Attending: Hematology and Oncology | Admitting: Hematology and Oncology

## 2018-03-27 DIAGNOSIS — Z170 Estrogen receptor positive status [ER+]: Secondary | ICD-10-CM

## 2018-03-27 DIAGNOSIS — C50411 Malignant neoplasm of upper-outer quadrant of right female breast: Secondary | ICD-10-CM | POA: Insufficient documentation

## 2018-03-27 DIAGNOSIS — Z853 Personal history of malignant neoplasm of breast: Secondary | ICD-10-CM | POA: Diagnosis not present

## 2018-03-27 MED ORDER — GADOBUTROL 1 MMOL/ML IV SOLN
9.0000 mL | Freq: Once | INTRAVENOUS | Status: AC | PRN
  Administered 2018-03-27: 9 mL via INTRAVENOUS

## 2018-03-28 ENCOUNTER — Telehealth: Payer: Self-pay | Admitting: Hematology and Oncology

## 2018-03-28 ENCOUNTER — Inpatient Hospital Stay: Payer: BLUE CROSS/BLUE SHIELD

## 2018-03-28 VITALS — BP 126/71 | HR 82 | Temp 98.8°F | Resp 18

## 2018-03-28 DIAGNOSIS — Z170 Estrogen receptor positive status [ER+]: Secondary | ICD-10-CM

## 2018-03-28 DIAGNOSIS — Z5111 Encounter for antineoplastic chemotherapy: Secondary | ICD-10-CM | POA: Diagnosis not present

## 2018-03-28 DIAGNOSIS — Z5189 Encounter for other specified aftercare: Secondary | ICD-10-CM | POA: Diagnosis not present

## 2018-03-28 DIAGNOSIS — Z5112 Encounter for antineoplastic immunotherapy: Secondary | ICD-10-CM | POA: Diagnosis not present

## 2018-03-28 DIAGNOSIS — E876 Hypokalemia: Secondary | ICD-10-CM | POA: Diagnosis not present

## 2018-03-28 DIAGNOSIS — C50411 Malignant neoplasm of upper-outer quadrant of right female breast: Secondary | ICD-10-CM

## 2018-03-28 DIAGNOSIS — D6481 Anemia due to antineoplastic chemotherapy: Secondary | ICD-10-CM | POA: Diagnosis not present

## 2018-03-28 MED ORDER — PEGFILGRASTIM-CBQV 6 MG/0.6ML ~~LOC~~ SOSY
6.0000 mg | PREFILLED_SYRINGE | Freq: Once | SUBCUTANEOUS | Status: AC
  Administered 2018-03-28: 6 mg via SUBCUTANEOUS

## 2018-03-28 MED ORDER — PEGFILGRASTIM-CBQV 6 MG/0.6ML ~~LOC~~ SOSY
PREFILLED_SYRINGE | SUBCUTANEOUS | Status: AC
  Filled 2018-03-28: qty 0.6

## 2018-03-28 NOTE — Patient Instructions (Signed)
Pegfilgrastim injection  What is this medicine?  PEGFILGRASTIM (PEG fil gra stim) is a long-acting granulocyte colony-stimulating factor that stimulates the growth of neutrophils, a type of white blood cell important in the body's fight against infection. It is used to reduce the incidence of fever and infection in patients with certain types of cancer who are receiving chemotherapy that affects the bone marrow, and to increase survival after being exposed to high doses of radiation.  This medicine may be used for other purposes; ask your health care provider or pharmacist if you have questions.  COMMON BRAND NAME(S): Fulphila, Neulasta, UDENYCA  What should I tell my health care provider before I take this medicine?  They need to know if you have any of these conditions:  -kidney disease  -latex allergy  -ongoing radiation therapy  -sickle cell disease  -skin reactions to acrylic adhesives (On-Body Injector only)  -an unusual or allergic reaction to pegfilgrastim, filgrastim, other medicines, foods, dyes, or preservatives  -pregnant or trying to get pregnant  -breast-feeding  How should I use this medicine?  This medicine is for injection under the skin. If you get this medicine at home, you will be taught how to prepare and give the pre-filled syringe or how to use the On-body Injector. Refer to the patient Instructions for Use for detailed instructions. Use exactly as directed. Tell your healthcare provider immediately if you suspect that the On-body Injector may not have performed as intended or if you suspect the use of the On-body Injector resulted in a missed or partial dose.  It is important that you put your used needles and syringes in a special sharps container. Do not put them in a trash can. If you do not have a sharps container, call your pharmacist or healthcare provider to get one.  Talk to your pediatrician regarding the use of this medicine in children. While this drug may be prescribed for  selected conditions, precautions do apply.  Overdosage: If you think you have taken too much of this medicine contact a poison control center or emergency room at once.  NOTE: This medicine is only for you. Do not share this medicine with others.  What if I miss a dose?  It is important not to miss your dose. Call your doctor or health care professional if you miss your dose. If you miss a dose due to an On-body Injector failure or leakage, a new dose should be administered as soon as possible using a single prefilled syringe for manual use.  What may interact with this medicine?  Interactions have not been studied.  Give your health care provider a list of all the medicines, herbs, non-prescription drugs, or dietary supplements you use. Also tell them if you smoke, drink alcohol, or use illegal drugs. Some items may interact with your medicine.  This list may not describe all possible interactions. Give your health care provider a list of all the medicines, herbs, non-prescription drugs, or dietary supplements you use. Also tell them if you smoke, drink alcohol, or use illegal drugs. Some items may interact with your medicine.  What should I watch for while using this medicine?  You may need blood work done while you are taking this medicine.  If you are going to need a MRI, CT scan, or other procedure, tell your doctor that you are using this medicine (On-Body Injector only).  What side effects may I notice from receiving this medicine?  Side effects that you should report to   your doctor or health care professional as soon as possible:  -allergic reactions like skin rash, itching or hives, swelling of the face, lips, or tongue  -back pain  -dizziness  -fever  -pain, redness, or irritation at site where injected  -pinpoint red spots on the skin  -red or dark-brown urine  -shortness of breath or breathing problems  -stomach or side pain, or pain at the shoulder  -swelling  -tiredness  -trouble passing urine or  change in the amount of urine  Side effects that usually do not require medical attention (report to your doctor or health care professional if they continue or are bothersome):  -bone pain  -muscle pain  This list may not describe all possible side effects. Call your doctor for medical advice about side effects. You may report side effects to FDA at 1-800-FDA-1088.  Where should I keep my medicine?  Keep out of the reach of children.  If you are using this medicine at home, you will be instructed on how to store it. Throw away any unused medicine after the expiration date on the label.  NOTE: This sheet is a summary. It may not cover all possible information. If you have questions about this medicine, talk to your doctor, pharmacist, or health care provider.   2019 Elsevier/Gold Standard (2017-06-23 16:57:08)

## 2018-03-28 NOTE — Telephone Encounter (Signed)
I left a message for the patient that the MRI of the breast showed a complete response.

## 2018-04-03 ENCOUNTER — Other Ambulatory Visit: Payer: Self-pay

## 2018-04-03 DIAGNOSIS — Z170 Estrogen receptor positive status [ER+]: Secondary | ICD-10-CM

## 2018-04-03 DIAGNOSIS — E876 Hypokalemia: Secondary | ICD-10-CM

## 2018-04-03 DIAGNOSIS — C50411 Malignant neoplasm of upper-outer quadrant of right female breast: Secondary | ICD-10-CM

## 2018-04-04 ENCOUNTER — Inpatient Hospital Stay: Payer: BLUE CROSS/BLUE SHIELD | Attending: Hematology and Oncology

## 2018-04-04 VITALS — BP 105/76 | HR 88 | Temp 97.9°F | Resp 16

## 2018-04-04 DIAGNOSIS — Z170 Estrogen receptor positive status [ER+]: Secondary | ICD-10-CM

## 2018-04-04 DIAGNOSIS — Z5112 Encounter for antineoplastic immunotherapy: Secondary | ICD-10-CM | POA: Insufficient documentation

## 2018-04-04 DIAGNOSIS — C50411 Malignant neoplasm of upper-outer quadrant of right female breast: Secondary | ICD-10-CM | POA: Insufficient documentation

## 2018-04-04 DIAGNOSIS — E876 Hypokalemia: Secondary | ICD-10-CM | POA: Diagnosis not present

## 2018-04-04 MED ORDER — SODIUM CHLORIDE 0.9 % IV SOLN
Freq: Once | INTRAVENOUS | Status: AC
  Administered 2018-04-04: 09:00:00 via INTRAVENOUS
  Filled 2018-04-04: qty 1000

## 2018-04-04 MED ORDER — POTASSIUM CHLORIDE 10 MEQ/100ML IV SOLN: 10.0000 meq | Freq: Once | INTRAVENOUS | Status: DC

## 2018-04-04 MED ORDER — SODIUM CHLORIDE 0.9 % IV SOLN
INTRAVENOUS | Status: AC
  Filled 2018-04-04: qty 250

## 2018-04-04 NOTE — Patient Instructions (Signed)
Have a great weekend.

## 2018-04-06 ENCOUNTER — Ambulatory Visit: Payer: Self-pay | Admitting: Surgery

## 2018-04-06 DIAGNOSIS — C50911 Malignant neoplasm of unspecified site of right female breast: Secondary | ICD-10-CM | POA: Diagnosis not present

## 2018-04-06 NOTE — H&P (Signed)
Sandra [name] Documented: 04/06/2018 3:23 PM Location: [facility] Patient #: [id number] DOB: [birth year] Married / Language: [name] / Race: White Female  History of Present Illness [name] A. Amad Mau MD; 04/06/2018 4:06 PM) Patient words: Patient returns for follow-up after neoadjuvant chemotherapy for her stage II right breast cancer multifocal. Her MRI shows a complete response. She has done well with therapy and is ready to proceed with surgery. She would like to proceed breast conserving surgery.                   Patient with history of right breast cancer status post neoadjuvant chemotherapy.  EXAM: BILATERAL BREAST MRI WITH AND WITHOUT CONTRAST  TECHNIQUE: Multiplanar, multisequence MR images of both breasts were obtained prior to and following the intravenous administration of 9 ml of Gadavist  Three-dimensional MR images were rendered by post-processing of the original MR data on an independent workstation. The three-dimensional MR images were interpreted, and findings are reported in the following complete MRI report for this study. Three dimensional images were evaluated at the independent DynaCad workstation  COMPARISON: Previous exam(s).  FINDINGS: Breast composition: b. Scattered fibroglandular tissue.  Background parenchymal enhancement: Mild  Right breast: Susceptibility artifact is demonstrated within the lateral right breast posterior, middle and anterior depth compatible with biopsy marking clips. No residual enhancement is identified within the right breast.  Left breast: No mass or abnormal enhancement.  Lymph nodes: No abnormal appearing lymph nodes.  Ancillary findings: None.  IMPRESSION: No significant residual enhancement within the right breast at the 3 sites of biopsy-proven malignancy within the outer right breast compatible with treatment response.  RECOMMENDATION: Treatment plan for known right breast  malignancy.  BI-RADS CATEGORY 6: Known biopsy-proven malignancy.   Electronically Signed By: Lovey Newcomer M.D. On: 03/27/2018 15:07.  The patient is a 53 year old female.   Allergies Malachi Bonds, CMA; 04/06/2018 3:26 PM) No Known Drug Allergies [04/06/2018]:  Medication History Malachi Bonds, CMA; 04/06/2018 3:27 PM) Betamethasone Valerate (0.1% Ointment, External) Active. Dexamethasone (4MG  Tablet, Oral) Active. Lidocaine-Prilocaine (2.5-2.5% Cream, External) Active. Ondansetron HCl (8MG  Tablet, Oral) Active. Pantoprazole Sodium (40MG  Tablet DR, Oral) Active. Prochlorperazine Maleate (10MG  Tablet, Oral) Active. Potassium Chloride ER (10MEQ Capsule ER, Oral) Active. Medications Reconciled    Vitals (Chemira Jones CMA; 04/06/2018 3:25 PM) 04/06/2018 3:25 PM Weight: 186.6 lb Height: 70in Body Surface Area: 2.03 m Body Mass Index: 26.77 kg/m  Temp.: 99.44F(Oral)  Pulse: 113 (Regular)  BP: 130/70 (Sitting, Left Arm, Standard)      Physical Exam (Damika Harmon A. Jarita Raval MD; 04/06/2018 4:07 PM)  General Mental Status-Alert. General Appearance-Consistent with stated age. Hydration-Well hydrated. Voice-Normal.  Head and Neck Head-normocephalic, atraumatic with no lesions or palpable masses.  Neurologic Neurologic evaluation reveals -alert and oriented x 3 with no impairment of recent or remote memory. Mental Status-Normal.    Assessment & Plan (Nyzir Dubois A. Hero Mccathern MD; 04/06/2018 4:07 PM)  BREAST CANCER, RIGHT (C50.911) Impression: Scheduled for right breast seed l Risk of lumpectomy include bleeding, infection, seroma, more surgery, use of seed/wire, wound care, cosmetic deformity and the need for other treatments, death , blood clots, death. Pt agrees to proceed. Risk of sentinel lymph node mapping include bleeding, infection, lymphedema, shoulder pain. stiffness, dye allergy. cosmetic deformity , blood clots, death, need for more surgery. Pt  agres to proceed. ocalized lumpectomy. She will require 3 seeds for the area in the right upper outer quadrant. We discussed that versus mastectomy and reconstruction. She would like to proceed  with lumpectomy. We discussed cosmetic issues and long-term expectations as well today.  Current Plans You are being scheduled for surgery- Our schedulers will call you.  You should hear from our office's scheduling department within 5 working days about the location, date, and time of surgery. We try to make accommodations for patient's preferences in scheduling surgery, but sometimes the OR schedule or the surgeon's schedule prevents [place] from making those accommodations.  If you have not heard from our office [phone]) in 5 working days, call the office and ask for your surgeon's nurse.  If you have other questions about your diagnosis, plan, or surgery, call the office and ask for your surgeon's nurse.  We discussed the staging and pathophysiology of breast cancer. We discussed all of the different options for treatment for breast cancer including surgery, chemotherapy, radiation therapy, Herceptin, and antiestrogen therapy. We discussed a sentinel lymph node biopsy as she does not appear to having lymph node involvement right now. We discussed the performance of that with injection of radioactive tracer and blue dye. We discussed that she would have an incision underneath her axillary hairline. We discussed that there is a bout a 10-20% chance of having a positive node with a sentinel lymph node biopsy and we will await the permanent pathology to make any other first further decisions in terms of her treatment. One of these options might be to return to the operating room to perform an axillary lymph node dissection. We discussed about a 1-2% risk lifetime of chronic shoulder pain as well as lymphedema associated with a sentinel lymph node biopsy. We discussed the options for treatment of the breast  cancer which included lumpectomy versus a mastectomy. We discussed the performance of the lumpectomy with a wire placement. We discussed a 10-20% chance of a positive margin requiring reexcision in the operating room. We also discussed that she may need radiation therapy or antiestrogen therapy or both if she undergoes lumpectomy. We discussed the mastectomy and the postoperative care for that as well. We discussed that there is no difference in her survival whether she undergoes lumpectomy with radiation therapy or antiestrogen therapy versus a mastectomy. There is a slight difference in the local recurrence rate being 3-5% with lumpectomy and about 1% with a mastectomy. We discussed the risks of operation including bleeding, infection, possible reoperation. She understands her further therapy will be based on what her stages at the time of her operation.  Pt Education - [facility] breast cancer surgery: discussed with patient and provided information. Pt Education - [facility] Breast Biopsy HCI: discussed with patient and provided information. Pt Education - [facility] Breast Pains Education

## 2018-04-06 NOTE — H&P (View-Only) (Signed)
Sandra [name] Documented: 04/06/2018 3:23 PM Location: [facility] Patient #: [id number] DOB: 18-Sep-1965 Married / Language: [name] / Race: White Female  History of Present Illness [name] A. Zonya Gudger MD; 04/06/2018 4:06 PM) Patient words: Patient returns for follow-up after neoadjuvant chemotherapy for her stage II right breast cancer multifocal. Her MRI shows a complete response. She has done well with therapy and is ready to proceed with surgery. She would like to proceed breast conserving surgery.                   Patient with history of right breast cancer status post neoadjuvant chemotherapy.  EXAM: BILATERAL BREAST MRI WITH AND WITHOUT CONTRAST  TECHNIQUE: Multiplanar, multisequence MR images of both breasts were obtained prior to and following the intravenous administration of 9 ml of Gadavist  Three-dimensional MR images were rendered by post-processing of the original MR data on an independent workstation. The three-dimensional MR images were interpreted, and findings are reported in the following complete MRI report for this study. Three dimensional images were evaluated at the independent Dyna[employer]ad workstation  COMPARISON: Previous exam(s).  FINDINGS: Breast composition: b. Scattered fibroglandular tissue.  Background parenchymal enhancement: Mild  Right breast: Susceptibility artifact is demonstrated within the lateral right breast posterior, middle and anterior depth compatible with biopsy marking clips. No residual enhancement is identified within the right breast.  Left breast: No mass or abnormal enhancement.  Lymph nodes: No abnormal appearing lymph nodes.  Ancillary findings: None.  IMPRESSION: No significant residual enhancement within the right breast at the 3 sites of biopsy-proven malignancy within the outer right breast compatible with treatment response.  RECOMMENDATION: Treatment plan for known right breast  malignancy.  BI-RADS CATEGORY 6: Known biopsy-proven malignancy.   Electronically Signed By: Lovey Newcomer M.D. On: 03/27/2018 15:07.  The patient is a 53 year old female.   Allergies Malachi Bonds, CMA; 04/06/2018 3:26 PM) No Known Drug Allergies [04/06/2018]:  Medication History Malachi Bonds, CMA; 04/06/2018 3:27 PM) Betamethasone Valerate (0.1% Ointment, External) Active. Dexamethasone (4MG  Tablet, Oral) Active. Lidocaine-Prilocaine (2.5-2.5% Cream, External) Active. Ondansetron HCl (8MG  Tablet, Oral) Active. Pantoprazole Sodium (40MG  Tablet DR, Oral) Active. Prochlorperazine Maleate (10MG  Tablet, Oral) Active. Potassium Chloride ER (10MEQ Capsule ER, Oral) Active. Medications Reconciled    Vitals (Chemira Jones CMA; 04/06/2018 3:25 PM) 04/06/2018 3:25 PM Weight: 186.6 lb Height: 70in Body Surface Area: 2.03 m Body Mass Index: 26.77 kg/m  Temp.: 99.56F(Oral)  Pulse: 113 (Regular)  BP: 130/70 (Sitting, Left Arm, Standard)      Physical Exam (Dwight Burdo A. Maven Varelas MD; 04/06/2018 4:07 PM)  General Mental Status-Alert. General Appearance-Consistent with stated age. Hydration-Well hydrated. Voice-Normal.  Head and Neck Head-normocephalic, atraumatic with no lesions or palpable masses.  Neurologic Neurologic evaluation reveals -alert and oriented x 3 with no impairment of recent or remote memory. Mental Status-Normal.    Assessment & Plan (Eulan Heyward A. Khloie Hamada MD; 04/06/2018 4:07 PM)  BREAST CANCER, RIGHT (C50.911) Impression: Scheduled for right breast seed l Risk of lumpectomy include bleeding, infection, seroma, more surgery, use of seed/wire, wound care, cosmetic deformity and the need for other treatments, death , blood clots, death. Pt agrees to proceed. Risk of sentinel lymph node mapping include bleeding, infection, lymphedema, shoulder pain. stiffness, dye allergy. cosmetic deformity , blood clots, death, need for more surgery. Pt  agres to proceed. ocalized lumpectomy. She will require 3 seeds for the area in the right upper outer quadrant. We discussed that versus mastectomy and reconstruction. She would like to proceed  with lumpectomy. We discussed cosmetic issues and long-term expectations as well today.  Current Plans You are being scheduled for surgery- Our schedulers will call you.  You should hear from our office's scheduling department within 5 working days about the location, date, and time of surgery. We try to make accommodations for patient's preferences in scheduling surgery, but sometimes the OR schedule or the surgeon's schedule prevents [place] from making those accommodations.  If you have not heard from our office [phone]) in 5 working days, call the office and ask for your surgeon's nurse.  If you have other questions about your diagnosis, plan, or surgery, call the office and ask for your surgeon's nurse.  We discussed the staging and pathophysiology of breast cancer. We discussed all of the different options for treatment for breast cancer including surgery, chemotherapy, radiation therapy, Herceptin, and antiestrogen therapy. We discussed a sentinel lymph node biopsy as she does not appear to having lymph node involvement right now. We discussed the performance of that with injection of radioactive tracer and blue dye. We discussed that she would have an incision underneath her axillary hairline. We discussed that there is a bout a 10-20% chance of having a positive node with a sentinel lymph node biopsy and we will await the permanent pathology to make any other first further decisions in terms of her treatment. One of these options might be to return to the operating room to perform an axillary lymph node dissection. We discussed about a 1-2% risk lifetime of chronic shoulder pain as well as lymphedema associated with a sentinel lymph node biopsy. We discussed the options for treatment of the breast  cancer which included lumpectomy versus a mastectomy. We discussed the performance of the lumpectomy with a wire placement. We discussed a 10-20% chance of a positive margin requiring reexcision in the operating room. We also discussed that she may need radiation therapy or antiestrogen therapy or both if she undergoes lumpectomy. We discussed the mastectomy and the postoperative care for that as well. We discussed that there is no difference in her survival whether she undergoes lumpectomy with radiation therapy or antiestrogen therapy versus a mastectomy. There is a slight difference in the local recurrence rate being 3-5% with lumpectomy and about 1% with a mastectomy. We discussed the risks of operation including bleeding, infection, possible reoperation. She understands her further therapy will be based on what her stages at the time of her operation.  Pt Education - [facility] breast cancer surgery: discussed with patient and provided information. Pt Education - [facility] Breast Biopsy HCI: discussed with patient and provided information. Pt Education - [facility] Breast Pains Education

## 2018-04-07 ENCOUNTER — Encounter (HOSPITAL_COMMUNITY): Payer: Self-pay | Admitting: Psychiatry

## 2018-04-07 ENCOUNTER — Ambulatory Visit (INDEPENDENT_AMBULATORY_CARE_PROVIDER_SITE_OTHER): Payer: BLUE CROSS/BLUE SHIELD | Admitting: Psychiatry

## 2018-04-07 VITALS — BP 102/76 | HR 74 | Ht 71.0 in | Wt 186.0 lb

## 2018-04-07 DIAGNOSIS — F330 Major depressive disorder, recurrent, mild: Secondary | ICD-10-CM | POA: Diagnosis not present

## 2018-04-07 DIAGNOSIS — F411 Generalized anxiety disorder: Secondary | ICD-10-CM | POA: Diagnosis not present

## 2018-04-07 MED ORDER — VENLAFAXINE HCL ER 75 MG PO CP24: 75.0000 mg | ORAL_CAPSULE | Freq: Two times a day (BID) | ORAL | 0 refills | Status: DC

## 2018-04-07 NOTE — Progress Notes (Signed)
Psychiatric Initial Adult Assessment   Patient Identification: Sandra [name] MRN:  [id number] Date of Evaluation:  04/07/2018   Referral Source: Self-referred.  Chief Complaint:  I have depression and anxiety.  Visit Diagnosis:    ICD-10-CM   1. GAD (generalized anxiety disorder) F41.1 venlafaxine XR (EFFEXOR-XR) 75 MG 24 hr capsule  2. MDD (major depressive disorder), recurrent episode, mild (HCC) F33.0     History of Present Illness: Sandra [name] is 53 year old Caucasian, married currently unemployed female who is self-referred for the management of her depression and anxiety symptoms.  [facility] but she was not happy with the care.  Patient told for the past 3 visits either provider was very late or she was unable to see her because they were busy.  She is taking Effexor 75 mg daily and lorazepam 1 mg for anxiety.  Patient diagnosed with breast cancer in August 2019.  She take lorazepam before her MRI and chemotherapy.  Patient recently finished her chemotherapy and now waiting for lymph node resection and radiation.  Patient was told yesterday that her MRI is negative.  Patient like to establish care in this office.  She mentioned that she started seeing psychiatrist few years ago when she is going through depression and anxiety.  However she also recall that she had post partum depression in 2007 but not sure if she was given medication at that time.  She struggle with anxiety and depression 3 years ago when her son refused to talk to her.  Patient was not happy with him after he had an affair with another woman.  Patient was very close to her sister-in-law and her brother was married to her for more than 24 years.  Patient did not like and started to have issues with the brother.  Patient told at that time she was very depressed, sad feeling detached with the family with no motivation to do things.  In the past 3 years she had tried multiple medication but remember  Zoloft Lamictal Lexapro Paxil and Wellbutrin.  She remember most of the medication makes her zombie with no effect.  She is taking Effexor for more than a year which work most of the time good.  Recently she had gene site testing and results were not discussed with the psychiatrist because past 3 visits she was unable to see the provider.  However her previous psychiatrist told her to cut down the Effexor from 150 mg to 75 mg so they can start the new medication but it was never started.  Currently she is taking Effexor 75 mg.  Patient continued to struggle with anxiety and depression.  She feels some time very sad, overwhelmed and worried about her future.  She also endorsed racing thoughts, decreased energy, lack of interest to do things.  She endorsed lack of appetite due to cancer treatment.  She lives with her 96 and 85 year old daughter and her husband who she has been married for 20 years.  Her husband is very supportive.  Patient denies any mania, psychosis, hallucination.  She denies any OCD or any panic attacks.  She admitted sometimes feeling hopeless but denies any suicidal thoughts or homicidal thought.  She was seeing therapist but has not seen recently due to busy with her chemotherapy.  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  Patient wants to have a permanent job but she was offered a part-time job.  Patient denies drinking or using any illegal substances.  Associated Signs/Symptoms: Depression Symptoms:  difficulty concentrating, hopelessness, anxiety, loss of energy/fatigue, (Hypo) Manic Symptoms:  No manic symptoms. Anxiety Symptoms:  Excessive Worry, Psychotic Symptoms:  No psychotic symptoms. PTSD Symptoms: Negative  Past Psychiatric History: No history of suicidal attempt or any psychiatric inpatient treatment.  Seen psychiatrist few years ago and last care [facility].  Had tried Paxil, Lexapro, Wellbutrin and Lamictal.  Do not  remember the details very well.  No history of psychosis or hallucination.  Previous Psychotropic Medications: Yes   Substance Abuse History in the last 12 months:  No.  Consequences of Substance Abuse: Negative  Past Medical History:  Past Medical History:  Diagnosis Date  . Breast cancer ([place]) 11/2017   rt.Br.  . Depression   . Endometriosis, diagnosis via laparoscopy   . Family history of breast cancer   . Family history of breast cancer   . Family history of colon cancer   . Family history of prostate cancer   . Hyperlipidemia 2017  . Lichen sclerosus, vulva   . Low serum vitamin D 2017    Past Surgical History:  Procedure Laterality Date  . APPENDECTOMY  2005  . CESAREAN SECTION  2002  . MANDIBLE RECONSTRUCTION  1985   wires inside jaw according to pt  . PELVIC LAPAROSCOPY  2004  . PORTACATH PLACEMENT Right 12/03/2017   Procedure: INSERTION PORT-A-CATH;  Surgeon: Erroll Luna, MD;  Location: [facility];  Service: General;  Laterality: Right;  . SHIN SURGERY  1998    Family Psychiatric History: Brother has anxiety and depression.  Family History:  Family History  Problem Relation Age of Onset  . Stroke Mother   . Heart disease Mother   . Colon cancer Father   . Prostate cancer Father   . Hypertension Brother   . Cancer Maternal Uncle        NOS  . Breast cancer Paternal Aunt        dx in her 48s  . Prostate cancer Paternal Uncle   . Alcohol abuse Maternal Grandfather   . Breast cancer Paternal Grandmother        dx in her 45s  . Prostate cancer Paternal Grandfather   . SIDS Brother   . Cancer Paternal Aunt   . Tuberculosis Paternal Uncle     Social History:   Social History   Socioeconomic History  . Marital status: Married    Spouse name: [name]  . Number of children: 2  . Years of education: Not on file  . Highest education level: Bachelor's degree (e.g., BA, AB, BS)  Occupational History  . Not on file  Social Needs  .  Financial resource strain: Patient refused  . Food insecurity:    Worry: Patient refused    Inability: Patient refused  . Transportation needs:    Medical: Patient refused    Non-medical: Patient refused  Tobacco Use  . Smoking status: Never Smoker  . Smokeless tobacco: Never Used  Substance and Sexual Activity  . Alcohol use: No    Alcohol/week: 0.0 standard drinks  . Drug use: No  . Sexual activity: Yes    Partners: Male    Comment: Mirena inserted 05/16/16, IUD is to be removed08/29/19  Lifestyle  . Physical activity:    Days per week: Patient refused    Minutes per session: Patient refused  . Stress: Patient refused  Relationships  . Social connections:    Talks on phone: Patient refused  Gets together: Not on file    Attends religious service: Not on file    Active member of club or organization: Not on file    Attends meetings of clubs or organizations: Not on file    Relationship status: Not on file  Other Topics Concern  . Not on file  Social History Narrative  . Not on file    Additional Social History: Born and raised in [place].  Parents married and live together.  Most of her family lives in [place].  Patient lives with her husband and 32 and 5 year old.  Patient has a good social network but no family lives in this area.  Allergies:  No Known Allergies  Metabolic Disorder Labs: Recent Results (from the past 2160 hour(s))  Cytology - PAP     Status: None   Collection Time: 01/19/18 12:00 AM  Result Value Ref Range   Adequacy      Satisfactory for evaluation. The presence or absence of an endocervical / transformation zone component cannot be determined because of atrophy.   Diagnosis      NEGATIVE FOR INTRAEPITHELIAL LESIONS OR MALIGNANCY.   HPV NOT DETECTED     Comment: Normal Reference Range - NOT Detected   Material Submitted CervicoVaginal Pap [ThinPrep Imaged]    CYTOLOGY - PAP PAP RESULT   CMP ([facility]cer Center only)     Status: Abnormal   Collection  Time: 01/22/18 10:00 AM  Result Value Ref Range   Sodium 141 135 - 145 mmol/L   Potassium 3.4 (L) 3.5 - 5.1 mmol/L   Chloride 105 98 - 111 mmol/L   CO2 26 22 - 32 mmol/L   Glucose, Bld 95 70 - 99 mg/dL   BUN 9 6 - 20 mg/dL   Creatinine 0.71 0.44 - 1.00 mg/dL   Calcium 9.2 8.9 - 10.3 mg/dL   Total Protein 6.7 6.5 - 8.1 g/dL   Albumin 3.4 (L) 3.5 - 5.0 g/dL   AST 16 15 - 41 U/L   ALT 23 0 - 44 U/L   Alkaline Phosphatase 70 38 - 126 U/L   Total Bilirubin 0.3 0.3 - 1.2 mg/dL   GFR, Est Non Af Am >60 >60 mL/min   GFR, Est AFR Am >60 >60 mL/min    Comment: (NOTE) The eGFR has been calculated using the CKD EPI equation. This calculation has not been validated in all clinical situations. eGFR's persistently <60 mL/min signify possible Chronic Kidney Disease.    Anion gap 10 5 - 15    Comment: Performed at [facility], [address] [place], [place] [address]  CBC with Differential ([place] Only)     Status: Abnormal   Collection Time: 01/22/18 10:00 AM  Result Value Ref Range   WBC Count 5.8 4.0 - 10.5 K/uL   RBC 3.53 (L) 3.87 - 5.11 MIL/uL   Hemoglobin 10.6 (L) 12.0 - 15.0 g/dL   HCT 32.0 (L) 36.0 - 46.0 %   MCV 90.7 80.0 - 100.0 fL   MCH 30.0 26.0 - 34.0 pg   MCHC 33.1 30.0 - 36.0 g/dL   RDW 14.5 11.5 - 15.5 %   Platelet Count 242 150 - 400 K/uL   nRBC 0.0 0.0 - 0.2 %   Neutrophils Relative % 57 %   Neutro Abs 3.3 1.7 - 7.7 K/uL   Lymphocytes Relative 30 %   Lymphs Abs 1.7 0.7 - 4.0 K/uL   Monocytes Relative 13 %   Monocytes Absolute  0.7 0.1 - 1.0 K/uL   Eosinophils Relative 0 %   Eosinophils Absolute 0.0 0.0 - 0.5 K/uL   Basophils Relative 0 %   Basophils Absolute 0.0 0.0 - 0.1 K/uL   Immature Granulocytes 0 %   Abs Immature Granulocytes 0.02 0.00 - 0.07 K/uL    Comment: Performed at [facility], [place] [address] [place], [place] [address]  CMP ([place] only)     Status: Abnormal   Collection Time:  02/12/18  8:16 AM  Result Value Ref Range   Sodium 142 135 - 145 mmol/L   Potassium 3.0 (LL) 3.5 - 5.1 mmol/L    Comment: CRITICAL RESULT CALLED TO, READ BACK BY AND VERIFIED WITH: May Armel, RN at 0940 by Prudencio Burly     Chloride 103 98 - 111 mmol/L   CO2 29 22 - 32 mmol/L   Glucose, Bld 83 70 - 99 mg/dL   BUN 6 6 - 20 mg/dL   Creatinine 0.77 0.44 - 1.00 mg/dL   Calcium 9.2 8.9 - 10.3 mg/dL   Total Protein 6.5 6.5 - 8.1 g/dL   Albumin 3.4 (L) 3.5 - 5.0 g/dL   AST 13 (L) 15 - 41 U/L   ALT 15 0 - 44 U/L   Alkaline Phosphatase 65 38 - 126 U/L   Total Bilirubin 0.3 0.3 - 1.2 mg/dL   GFR, Est Non Af Am >60 >60 mL/min   GFR, Est AFR Am >60 >60 mL/min    Comment: (NOTE) The eGFR has been calculated using the CKD EPI equation. This calculation has not been validated in all clinical situations. eGFR's persistently <60 mL/min signify possible Chronic Kidney Disease.    Anion gap 10 5 - 15    Comment: Performed at [facility], [address], [place], [place] [address]  CBC with Differential ([place] Only)     Status: Abnormal   Collection Time: 02/12/18  8:16 AM  Result Value Ref Range   WBC Count 5.8 4.0 - 10.5 K/uL   RBC 3.26 (L) 3.87 - 5.11 MIL/uL   Hemoglobin 10.1 (L) 12.0 - 15.0 g/dL   HCT 30.2 (L) 36.0 - 46.0 %   MCV 92.6 80.0 - 100.0 fL   MCH 31.0 26.0 - 34.0 pg   MCHC 33.4 30.0 - 36.0 g/dL   RDW 15.5 11.5 - 15.5 %   Platelet Count 206 150 - 400 K/uL   nRBC 0.0 0.0 - 0.2 %   Neutrophils Relative % 56 %   Neutro Abs 3.2 1.7 - 7.7 K/uL   Lymphocytes Relative 32 %   Lymphs Abs 1.8 0.7 - 4.0 K/uL   Monocytes Relative 12 %   Monocytes Absolute 0.7 0.1 - 1.0 K/uL   Eosinophils Relative 0 %   Eosinophils Absolute 0.0 0.0 - 0.5 K/uL   Basophils Relative 0 %   Basophils Absolute 0.0 0.0 - 0.1 K/uL   Immature Granulocytes 0 %   Abs Immature Granulocytes 0.02 0.00 - 0.07 K/uL    Comment: Performed at [facility], [address], [place], [place] [address]  CMP ([place] only)     Status: Abnormal   Collection Time: 03/05/18 10:02 AM  Result Value Ref Range   Sodium 140 135 - 145 mmol/L   Potassium 2.9 (LL) 3.5 - 5.1 mmol/L    Comment: CRITICAL RESULT CALLED TO, READ BACK BY AND VERIFIED WITH: May Armel, RN, 2585  Chloride 103 98 - 111 mmol/L   CO2 27 22 - 32 mmol/L   Glucose, Bld 87 70 - 99 mg/dL   BUN 7 6 - 20 mg/dL   Creatinine 0.79 0.44 - 1.00 mg/dL   Calcium 9.3 8.9 - 10.3 mg/dL   Total Protein 6.6 6.5 - 8.1 g/dL   Albumin 3.7 3.5 - 5.0 g/dL   AST 15 15 - 41 U/L   ALT 12 0 - 44 U/L   Alkaline Phosphatase 62 38 - 126 U/L   Total Bilirubin 0.4 0.3 - 1.2 mg/dL   GFR, Est Non Af Am >60 >60 mL/min   GFR, Est AFR Am >60 >60 mL/min   Anion gap 10 5 - 15    Comment: Performed at [facility], [place] [address] [place], [place] [address]  CBC with Differential ([place] Only)     Status: Abnormal   Collection Time: 03/05/18 10:02 AM  Result Value Ref Range   WBC Count 5.3 4.0 - 10.5 K/uL   RBC 3.48 (L) 3.87 - 5.11 MIL/uL   Hemoglobin 10.8 (L) 12.0 - 15.0 g/dL   HCT 32.2 (L) 36.0 - 46.0 %   MCV 92.5 80.0 - 100.0 fL   MCH 31.0 26.0 - 34.0 pg   MCHC 33.5 30.0 - 36.0 g/dL   RDW 16.2 (H) 11.5 - 15.5 %   Platelet Count 168 150 - 400 K/uL   nRBC 0.0 0.0 - 0.2 %   Neutrophils Relative % 53 %   Neutro Abs 2.8 1.7 - 7.7 K/uL   Lymphocytes Relative 34 %   Lymphs Abs 1.8 0.7 - 4.0 K/uL   Monocytes Relative 13 %   Monocytes Absolute 0.7 0.1 - 1.0 K/uL   Eosinophils Relative 0 %   Eosinophils Absolute 0.0 0.0 - 0.5 K/uL   Basophils Relative 0 %   Basophils Absolute 0.0 0.0 - 0.1 K/uL   Immature Granulocytes 0 %   Abs Immature Granulocytes 0.02 0.00 - 0.07 K/uL    Comment: Performed at [facility], [address], [place], [place] [address]  RESEARCH LABS     Status: None   Collection Time: 03/05/18 10:02 AM  Result Value Ref Range    [facility] See Scanned report in [place]: Performed at [facility], [place] [address], [place], [place] [address]  CMP ([place] only)     Status: Abnormal   Collection Time: 03/26/18  8:09 AM  Result Value Ref Range   Sodium 139 135 - 145 mmol/L   Potassium 3.5 3.5 - 5.1 mmol/L   Chloride 105 98 - 111 mmol/L   CO2 24 22 - 32 mmol/L   Glucose, Bld 133 (H) 70 - 99 mg/dL   BUN 12 6 - 20 mg/dL   Creatinine 0.74 0.44 - 1.00 mg/dL   Calcium 8.8 (L) 8.9 - 10.3 mg/dL   Total Protein 6.2 (L) 6.5 - 8.1 g/dL   Albumin 3.3 (L) 3.5 - 5.0 g/dL   AST 19 15 - 41 U/L   ALT 16 0 - 44 U/L   Alkaline Phosphatase 49 38 - 126 U/L   Total Bilirubin 0.2 (L) 0.3 - 1.2 mg/dL   GFR, Est Non Af Am >60 >60 mL/min   GFR, Est AFR Am >60 >60 mL/min   Anion gap 10 5 - 15    Comment: Performed at [facility], [address] [place], [place] [address]  CBC with Differential ([place] Only)     Status: Abnormal   Collection Time: 03/26/18  8:09 AM  Result Value Ref Range   WBC Count 6.7 4.0 - 10.5 K/uL   RBC 3.18 (L) 3.87 - 5.11 MIL/uL   Hemoglobin 10.3 (L) 12.0 - 15.0 g/dL   HCT 31.0 (L) 36.0 - 46.0 %   MCV 97.5 80.0 - 100.0 fL   MCH 32.4 26.0 - 34.0 pg   MCHC 33.2 30.0 - 36.0 g/dL   RDW 15.8 (H) 11.5 - 15.5 %   Platelet Count 180 150 - 400 K/uL   nRBC 0.0 0.0 - 0.2 %   Neutrophils Relative % 76 %   Neutro Abs 5.0 1.7 - 7.7 K/uL   Lymphocytes Relative 16 %   Lymphs Abs 1.1 0.7 - 4.0 K/uL   Monocytes Relative 7 %   Monocytes Absolute 0.5 0.1 - 1.0 K/uL   Eosinophils Relative 1 %   Eosinophils Absolute 0.1 0.0 - 0.5 K/uL   Basophils Relative 0 %   Basophils Absolute 0.0 0.0 - 0.1 K/uL   Immature Granulocytes 0 %   Abs Immature Granulocytes 0.03 0.00 - 0.07 K/uL    Comment: Performed at [facility], [place] [address], [place], [place] [address]   Lab Results  Component Value Date   HGBA1C 5.4  04/21/2017   No results found for: PROLACTIN Lab Results  Component Value Date   CHOL 242 (H) 04/21/2017   TRIG 99.0 04/21/2017   HDL 58.60 04/21/2017   CHOLHDL 4 04/21/2017   VLDL 19.8 04/21/2017   LDLCALC 163 (H) 04/21/2017   LDLCALC 158 (H) 12/26/2016   Lab Results  Component Value Date   TSH 1.43 04/21/2017    Therapeutic Level Labs: No results found for: LITHIUM No results found for: CBMZ No results found for: VALPROATE  Current Medications: Current Outpatient Medications  Medication Sig Dispense Refill  . betamethasone valerate ointment (VALISONE) 0.1 % Apply bid for 1-2 weeks as needed for flare of lichen sclerosis. 15 g 1  . ibuprofen (ADVIL,MOTRIN) 800 MG tablet Take 1 tablet (800 mg total) by mouth every 8 (eight) hours as needed. 30 tablet 0  . lidocaine-prilocaine (EMLA) cream   5  . LORazepam (ATIVAN) 1 MG tablet Take 1 tablet (1 mg total) by mouth every 8 (eight) hours as needed for anxiety (or nausea). 30 tablet 0  . pantoprazole (PROTONIX) 40 MG tablet Take 1 tablet (40 mg total) by mouth daily. 30 tablet 1  . prochlorperazine (COMPAZINE) 10 MG tablet Take 1 tablet (10 mg total) by mouth every 6 (six) hours as needed (Nausea or vomiting). 30 tablet 1  . venlafaxine XR (EFFEXOR-XR) 75 MG 24 hr capsule Take 1 capsule (75 mg total) by mouth daily. 90 capsule 3  . ondansetron (ZOFRAN) 8 MG tablet TAKE 1 TABLET (8 MG TOTAL) BY MOUTH 2 (TWO) TIMES DAILY AS NEEDED (NAUSEA OR VOMITING). (Patient not taking: Reported on 04/07/2018) 30 tablet 1  . potassium chloride (MICRO-K) 10 MEQ CR capsule Take 2 capsules (20 mEq total) by mouth daily. (Patient not taking: Reported on 04/07/2018) 60 capsule 3   No current facility-administered medications for this visit.     Musculoskeletal: Strength & Muscle Tone: within normal limits Gait & Station: normal Patient leans: N/A  Psychiatric Specialty Exam: ROS  Blood pressure 102/76, pulse 74, height '5\' 11"'  (1.803 m), weight 186 lb  (84.4 kg).Body mass  index is 25.94 kg/m.  General Appearance: Casual  Eye Contact:  Good  Speech:  Clear and Coherent  Volume:  Normal  Mood:  Anxious  Affect:  Congruent  Thought Process:  Goal Directed  Orientation:  Full (Time, Place, and Person)  Thought Content:  Rumination  Suicidal Thoughts:  No  Homicidal Thoughts:  No  Memory:  Immediate;   Good Recent;   Good Remote;   Fair  Judgement:  Good  Insight:  Good  Psychomotor Activity:  Normal  Concentration:  Concentration: Fair and Attention Span: Fair  Recall:  Good  Fund of Knowledge:Good  Language: Good  Akathisia:  No  Handed:  Right  AIMS (if indicated):  not done  Assets:  Communication Skills Desire for Improvement Housing Resilience Social Support  ADL's:  Intact  Cognition: WNL  Sleep:  Fair   Screenings: PHQ2-9     Office Visit from 04/21/2017 in [facility]  PHQ-2 Total Score  0      Assessment and Plan: Major depressive disorder, recurrent mild.  Generalized anxiety disorder.  Leeanna is a 53 year old Caucasian, married unemployed female who came to her appointment to establish care at this office.  She was not happy with [facility].  Currently she is taking Effexor 75 mg daily but continued to have symptoms of anxiety, depression, lack of interest, lack of motivation and feeling overwhelmed about the future.  Recommended to increase Effexor 75 mg twice a day.  She used to take higher dose but it was decreased few months ago.  Continue lorazepam 1 mg as needed for severe anxiety especially before MRI and other procedures.  Patient used to see a therapist but has not seen in recent months and I recommended to reestablish therapy and counseling.  We will get [facility] from [facility] as patient has gene site testing there but do not know the results.  We discussed safety concerns at any time having active suicidal thoughts or homicidal thought and she need to  call 911 or go to local emergency room.  I will see her again in 6 to 8 weeks.   Kathlee Nations, MD 1/7/202010:56 AM

## 2018-04-08 ENCOUNTER — Other Ambulatory Visit: Payer: Self-pay | Admitting: Surgery

## 2018-04-08 DIAGNOSIS — C50911 Malignant neoplasm of unspecified site of right female breast: Secondary | ICD-10-CM

## 2018-04-09 ENCOUNTER — Telehealth: Payer: Self-pay | Admitting: Hematology and Oncology

## 2018-04-09 NOTE — Telephone Encounter (Signed)
Scheduled appt per 1/8 sch message - pt is aware of apt added.

## 2018-04-16 ENCOUNTER — Inpatient Hospital Stay: Payer: BLUE CROSS/BLUE SHIELD

## 2018-04-16 VITALS — BP 108/61 | HR 83 | Temp 98.2°F | Resp 18

## 2018-04-16 DIAGNOSIS — C50411 Malignant neoplasm of upper-outer quadrant of right female breast: Secondary | ICD-10-CM | POA: Diagnosis not present

## 2018-04-16 DIAGNOSIS — Z5112 Encounter for antineoplastic immunotherapy: Secondary | ICD-10-CM | POA: Diagnosis not present

## 2018-04-16 DIAGNOSIS — E876 Hypokalemia: Secondary | ICD-10-CM | POA: Diagnosis not present

## 2018-04-16 DIAGNOSIS — Z170 Estrogen receptor positive status [ER+]: Secondary | ICD-10-CM

## 2018-04-16 MED ORDER — HEPARIN SOD (PORK) LOCK FLUSH 100 UNIT/ML IV SOLN
500.0000 [IU] | Freq: Once | INTRAVENOUS | Status: AC | PRN
  Administered 2018-04-16: 500 [IU]
  Filled 2018-04-16: qty 5

## 2018-04-16 MED ORDER — TRASTUZUMAB CHEMO 150 MG IV SOLR
525.0000 mg | Freq: Once | INTRAVENOUS | Status: AC
  Administered 2018-04-16: 525 mg via INTRAVENOUS
  Filled 2018-04-16: qty 25

## 2018-04-16 MED ORDER — DIPHENHYDRAMINE HCL 25 MG PO CAPS: 50.0000 mg | ORAL_CAPSULE | Freq: Once | ORAL | Status: DC

## 2018-04-16 MED ORDER — ACETAMINOPHEN 325 MG PO TABS: 650.0000 mg | ORAL_TABLET | Freq: Once | ORAL | Status: DC

## 2018-04-16 MED ORDER — SODIUM CHLORIDE 0.9% FLUSH
10.0000 mL | INTRAVENOUS | Status: DC | PRN
  Administered 2018-04-16: 10 mL
  Filled 2018-04-16: qty 10

## 2018-04-16 MED ORDER — SODIUM CHLORIDE 0.9 % IV SOLN
Freq: Once | INTRAVENOUS | Status: AC
  Administered 2018-04-16: 09:00:00 via INTRAVENOUS
  Filled 2018-04-16: qty 250

## 2018-04-16 MED ORDER — SODIUM CHLORIDE 0.9 % IV SOLN
420.0000 mg | Freq: Once | INTRAVENOUS | Status: AC
  Administered 2018-04-16: 420 mg via INTRAVENOUS
  Filled 2018-04-16: qty 14

## 2018-04-16 NOTE — Patient Instructions (Signed)
[facility] Center Discharge Instructions for Patients Receiving Chemotherapy  Today you received the following chemotherapy agents Herceptin and Perjeta.   To help prevent nausea and vomiting after your treatment, we encourage you to take your nausea medication as directed.   If you develop nausea and vomiting that is not controlled by your nausea medication, call the clinic.   BELOW ARE SYMPTOMS THAT SHOULD BE REPORTED IMMEDIATELY:  *FEVER GREATER THAN 100.5 F  *CHILLS WITH OR WITHOUT FEVER  NAUSEA AND VOMITING THAT IS NOT CONTROLLED WITH YOUR NAUSEA MEDICATION  *UNUSUAL SHORTNESS OF BREATH  *UNUSUAL BRUISING OR BLEEDING  TENDERNESS IN MOUTH AND THROAT WITH OR WITHOUT PRESENCE OF ULCERS  *URINARY PROBLEMS  *BOWEL PROBLEMS  UNUSUAL RASH Items with * indicate a potential emergency and should be followed up as soon as possible.  Feel free to call the clinic should you have any questions or concerns. The clinic phone number is [phone].  Please show the CHEMO ALERT CARD at check-in to the Emergency Department and triage nurse.   

## 2018-04-16 NOTE — Pre-Procedure Instructions (Signed)
Maryjane Benedict Stuckert  04/16/2018      [id number] - [place], [place] - [address] [address] Phone: [phone] Fax: [phone]  [id number] - [place] [address] Phone: [phone] Fax: [phone]    Your procedure is scheduled on Thursday January 23rd.  Report to [facility] Admitting at 1230 P.M.  Call this number if you have problems the morning of surgery:  [phone]   Remember:  Do not eat after midnight.  You may drink clear liquids until 11:30 am .  Clear liquids allowed are:                    Water, Juice (non-citric and without pulp), Carbonated beverages, Clear Tea, Black Coffee only and Gatorade    Take these medicines the morning of surgery with A SIP OF WATER  LORazepam (ATIVAN) if needed pantoprazole (PROTONIX) venlafaxine XR (EFFEXOR-XR)  7 days prior to surgery STOP taking any Aspirin (unless otherwise instructed by your surgeon), Aleve, Naproxen, Ibuprofen, Motrin, Advil, Goody's, BC's, all herbal medications, fish oil, and all vitamins.     Do not wear jewelry, make-up or nail polish.  Do not wear lotions, powders, or perfumes, or deodorant.  Do not shave 48 hours prior to surgery.  Men may shave face and neck.  Do not bring valuables to the hospital.  [facility] is not responsible for any belongings or valuables.  Contacts, dentures or bridgework may not be worn into surgery.  Leave your suitcase in the car.  After surgery it may be brought to your room.  For patients admitted to the hospital, discharge time will be determined by your treatment team.  Patients discharged the day of surgery will not be allowed to drive home.    [facility]- Preparing For Surgery  Before surgery, you can play an important role. Because skin is not sterile, your skin needs to be as free of germs as possible. You can reduce the number of germs on your skin by  washing with CHG (chlorahexidine gluconate) Soap before surgery.  CHG is an antiseptic cleaner which kills germs and bonds with the skin to continue killing germs even after washing.    Oral Hygiene is also important to reduce your risk of infection.  Remember - BRUSH YOUR TEETH THE MORNING OF SURGERY WITH YOUR REGULAR TOOTHPASTE  Please do not use if you have an allergy to CHG or antibacterial soaps. If your skin becomes reddened/irritated stop using the CHG.  Do not shave (including legs and underarms) for at least 48 hours prior to first CHG shower. It is OK to shave your face.  Please follow these instructions carefully.   1. Shower the NIGHT BEFORE SURGERY and the MORNING OF SURGERY with CHG.   2. If you chose to wash your hair, wash your hair first as usual with your normal shampoo.  3. After you shampoo, rinse your hair and body thoroughly to remove the shampoo.  4. Use CHG as you would any other liquid soap. You can apply CHG directly to the skin and wash gently with a scrungie or a clean washcloth.   5. Apply the CHG Soap to your body ONLY FROM THE NECK DOWN.  Do not use on open wounds or open sores. Avoid contact with your eyes, ears, mouth and genitals (private parts). Wash Face and genitals (private parts)  with your normal soap.  6. Wash thoroughly, paying special attention to the area where your surgery will be performed.  7. Thoroughly rinse your body with warm water from the neck down.  8. DO NOT shower/wash with your normal soap after using and rinsing off the CHG Soap.  9. Pat yourself dry with a CLEAN TOWEL.  10. Wear CLEAN PAJAMAS to bed the night before surgery, wear comfortable clothes the morning of surgery  11. Place CLEAN SHEETS on your bed the night of your first shower and DO NOT SLEEP WITH PETS.    Day of Surgery:  Do not apply any deodorants/lotions.  Please wear clean clothes to the hospital/surgery center.   Remember to brush your teeth WITH YOUR  REGULAR TOOTHPASTE.    Please read over the following fact sheets that you were given.

## 2018-04-17 ENCOUNTER — Encounter (HOSPITAL_COMMUNITY)
Admission: RE | Admit: 2018-04-17 | Discharge: 2018-04-17 | Disposition: A | Discharge status: Home / Self Care | Payer: BLUE CROSS/BLUE SHIELD | Source: Ambulatory Visit | Attending: Surgery | Admitting: Surgery

## 2018-04-17 ENCOUNTER — Encounter (HOSPITAL_COMMUNITY): Payer: Self-pay

## 2018-04-17 ENCOUNTER — Other Ambulatory Visit: Payer: Self-pay

## 2018-04-17 DIAGNOSIS — C50911 Malignant neoplasm of unspecified site of right female breast: Secondary | ICD-10-CM

## 2018-04-17 DIAGNOSIS — Z01812 Encounter for preprocedural laboratory examination: Secondary | ICD-10-CM | POA: Diagnosis not present

## 2018-04-17 LAB — COMPREHENSIVE METABOLIC PANEL
ALT: 28 U/L (ref 0–44)
AST: 28 U/L (ref 15–41)
Albumin: 3.4 g/dL — ABNORMAL LOW (ref 3.5–5.0)
Alkaline Phosphatase: 47 U/L (ref 38–126)
Anion gap: 10 (ref 5–15)
BUN: 8 mg/dL (ref 6–20)
CO2: 25 mmol/L (ref 22–32)
Calcium: 8.9 mg/dL (ref 8.9–10.3)
Chloride: 103 mmol/L (ref 98–111)
Creatinine, Ser: 0.84 mg/dL (ref 0.44–1.00)
GFR calc Af Amer: >60 mL/min (ref >60)
GFR calc non Af Amer: >60 mL/min (ref >60)
Glucose, Bld: 90 mg/dL (ref 70–99)
Potassium: 3.6 mmol/L (ref 3.5–5.1)
Sodium: 138 mmol/L (ref 135–145)
Total Bilirubin: 0.3 mg/dL (ref 0.3–1.2)
Total Protein: 6.1 g/dL — ABNORMAL LOW (ref 6.5–8.1)

## 2018-04-17 LAB — CBC WITH DIFFERENTIAL/PLATELET
Abs Immature Granulocytes: 0.01 10*3/uL (ref 0.00–0.07)
Basophils Absolute: 0 10*3/uL (ref 0.0–0.1)
Basophils Relative: 0 %
Eosinophils Absolute: 0 10*3/uL (ref 0.0–0.5)
Eosinophils Relative: 0 %
HCT: 33.9 % — ABNORMAL LOW (ref 36.0–46.0)
Hemoglobin: 10.5 g/dL — ABNORMAL LOW (ref 12.0–15.0)
Immature Granulocytes: 0 %
Lymphocytes Relative: 35 %
Lymphs Abs: 1.2 10*3/uL (ref 0.7–4.0)
MCH: 31.9 pg (ref 26.0–34.0)
MCHC: 31 g/dL (ref 30.0–36.0)
MCV: 103 fL — ABNORMAL HIGH (ref 80.0–100.0)
Monocytes Absolute: 0.4 10*3/uL (ref 0.1–1.0)
Monocytes Relative: 12 %
Neutro Abs: 1.8 10*3/uL (ref 1.7–7.7)
Neutrophils Relative %: 53 %
Platelets: 177 10*3/uL (ref 150–400)
RBC: 3.29 MIL/uL — ABNORMAL LOW (ref 3.87–5.11)
RDW: 15.4 % (ref 11.5–15.5)
WBC: 3.5 10*3/uL — ABNORMAL LOW (ref 4.0–10.5)
nRBC: 0 % (ref 0.0–0.2)

## 2018-04-17 NOTE — Progress Notes (Signed)
PCP: Dr. Quincy Simmonds ([facility])  DM: denies  SA: denies  Pt denies SOB, cough, fever, chest pain  Pt stated understanding of instructions given for DOS.

## 2018-04-21 ENCOUNTER — Ambulatory Visit
Admission: RE | Admit: 2018-04-21 | Discharge: 2018-04-21 | Disposition: A | Discharge status: Home / Self Care | Payer: BLUE CROSS/BLUE SHIELD | Source: Ambulatory Visit | Attending: Surgery | Admitting: Surgery

## 2018-04-21 DIAGNOSIS — C50911 Malignant neoplasm of unspecified site of right female breast: Secondary | ICD-10-CM

## 2018-04-21 DIAGNOSIS — C50411 Malignant neoplasm of upper-outer quadrant of right female breast: Secondary | ICD-10-CM | POA: Diagnosis not present

## 2018-04-22 MED ORDER — CEFAZOLIN SODIUM-DEXTROSE 2-4 GM/100ML-% IV SOLN
2.0000 g | INTRAVENOUS | Status: AC
  Administered 2018-04-23: 2 g via INTRAVENOUS
  Filled 2018-04-22: qty 100

## 2018-04-23 ENCOUNTER — Ambulatory Visit
Admission: RE | Admit: 2018-04-23 | Discharge: 2018-04-23 | Disposition: A | Discharge status: Home / Self Care | Payer: BLUE CROSS/BLUE SHIELD | Source: Ambulatory Visit | Attending: Surgery | Admitting: Surgery

## 2018-04-23 ENCOUNTER — Encounter (HOSPITAL_COMMUNITY)
Admission: RE | Disposition: A | Discharge status: Home / Self Care | Payer: Self-pay | Source: Home / Self Care | Attending: Surgery

## 2018-04-23 ENCOUNTER — Encounter (HOSPITAL_COMMUNITY): Payer: Self-pay | Admitting: *Deleted

## 2018-04-23 ENCOUNTER — Ambulatory Visit (HOSPITAL_COMMUNITY): Payer: BLUE CROSS/BLUE SHIELD | Admitting: Certified Registered Nurse Anesthetist

## 2018-04-23 ENCOUNTER — Ambulatory Visit (HOSPITAL_COMMUNITY)
Admission: RE | Admit: 2018-04-23 | Discharge: 2018-04-23 | Disposition: A | Discharge status: Home / Self Care | Payer: BLUE CROSS/BLUE SHIELD | Source: Ambulatory Visit | Attending: Surgery | Admitting: Surgery

## 2018-04-23 ENCOUNTER — Ambulatory Visit (HOSPITAL_COMMUNITY)
Admission: RE | Admit: 2018-04-23 | Discharge: 2018-04-23 | Disposition: A | Discharge status: Home / Self Care | Payer: BLUE CROSS/BLUE SHIELD | Attending: Surgery | Admitting: Surgery

## 2018-04-23 DIAGNOSIS — C50411 Malignant neoplasm of upper-outer quadrant of right female breast: Secondary | ICD-10-CM | POA: Diagnosis not present

## 2018-04-23 DIAGNOSIS — C50911 Malignant neoplasm of unspecified site of right female breast: Secondary | ICD-10-CM

## 2018-04-23 DIAGNOSIS — Z170 Estrogen receptor positive status [ER+]: Secondary | ICD-10-CM | POA: Insufficient documentation

## 2018-04-23 DIAGNOSIS — Z79899 Other long term (current) drug therapy: Secondary | ICD-10-CM | POA: Insufficient documentation

## 2018-04-23 DIAGNOSIS — G8918 Other acute postprocedural pain: Secondary | ICD-10-CM | POA: Diagnosis not present

## 2018-04-23 DIAGNOSIS — F329 Major depressive disorder, single episode, unspecified: Secondary | ICD-10-CM | POA: Insufficient documentation

## 2018-04-23 DIAGNOSIS — R51 Headache: Secondary | ICD-10-CM | POA: Diagnosis not present

## 2018-04-23 DIAGNOSIS — R928 Other abnormal and inconclusive findings on diagnostic imaging of breast: Secondary | ICD-10-CM | POA: Diagnosis not present

## 2018-04-23 DIAGNOSIS — E669 Obesity, unspecified: Secondary | ICD-10-CM | POA: Diagnosis not present

## 2018-04-23 DIAGNOSIS — Z6825 Body mass index (BMI) 25.0-25.9, adult: Secondary | ICD-10-CM | POA: Diagnosis not present

## 2018-04-23 DIAGNOSIS — E785 Hyperlipidemia, unspecified: Secondary | ICD-10-CM | POA: Insufficient documentation

## 2018-04-23 HISTORY — PX: BREAST LUMPECTOMY: SHX2

## 2018-04-23 HISTORY — PX: BREAST LUMPECTOMY WITH RADIOACTIVE SEED AND SENTINEL LYMPH NODE BIOPSY: SHX6550

## 2018-04-23 SURGERY — BREAST LUMPECTOMY WITH RADIOACTIVE SEED AND SENTINEL LYMPH NODE BIOPSY
Anesthesia: General | Site: Breast | Laterality: Right

## 2018-04-23 MED ORDER — ACETAMINOPHEN 500 MG PO TABS
1000.0000 mg | ORAL_TABLET | ORAL | Status: AC
  Administered 2018-04-23: 1000 mg via ORAL
  Filled 2018-04-23: qty 2

## 2018-04-23 MED ORDER — FENTANYL CITRATE (PF) 100 MCG/2ML IJ SOLN: 25.0000 ug | INTRAMUSCULAR | Status: DC | PRN

## 2018-04-23 MED ORDER — BUPIVACAINE-EPINEPHRINE 0.25% -1:200000 IJ SOLN
INTRAMUSCULAR | Status: DC | PRN
  Administered 2018-04-23: 15 mL

## 2018-04-23 MED ORDER — DEXAMETHASONE SODIUM PHOSPHATE 10 MG/ML IJ SOLN
INTRAMUSCULAR | Status: DC | PRN
  Administered 2018-04-23: 5 mg via INTRAVENOUS

## 2018-04-23 MED ORDER — OXYCODONE HCL 5 MG PO TABS: 5.0000 mg | ORAL_TABLET | Freq: Once | ORAL | Status: DC | PRN

## 2018-04-23 MED ORDER — ONDANSETRON HCL 4 MG/2ML IJ SOLN
INTRAMUSCULAR | Status: DC | PRN
  Administered 2018-04-23: 4 mg via INTRAVENOUS

## 2018-04-23 MED ORDER — FENTANYL CITRATE (PF) 100 MCG/2ML IJ SOLN
100.0000 ug | Freq: Once | INTRAMUSCULAR | Status: AC
  Administered 2018-04-23: 100 ug via INTRAVENOUS

## 2018-04-23 MED ORDER — OXYCODONE HCL 5 MG/5ML PO SOLN: 5.0000 mg | Freq: Once | ORAL | Status: DC | PRN

## 2018-04-23 MED ORDER — FENTANYL CITRATE (PF) 100 MCG/2ML IJ SOLN
INTRAMUSCULAR | Status: AC
  Administered 2018-04-23: 100 ug via INTRAVENOUS
  Filled 2018-04-23: qty 2

## 2018-04-23 MED ORDER — ACETAMINOPHEN 160 MG/5ML PO SOLN: 325.0000 mg | ORAL | Status: DC | PRN

## 2018-04-23 MED ORDER — CLONIDINE HCL (ANALGESIA) 100 MCG/ML EP SOLN
EPIDURAL | Status: DC | PRN
  Administered 2018-04-23: 100 ug

## 2018-04-23 MED ORDER — LIDOCAINE 2% (20 MG/ML) 5 ML SYRINGE
INTRAMUSCULAR | Status: AC
  Filled 2018-04-23: qty 10

## 2018-04-23 MED ORDER — DEXAMETHASONE SODIUM PHOSPHATE 10 MG/ML IJ SOLN
INTRAMUSCULAR | Status: AC
  Filled 2018-04-23: qty 1

## 2018-04-23 MED ORDER — HEMOSTATIC AGENTS (NO CHARGE) OPTIME
TOPICAL | Status: DC | PRN
  Administered 2018-04-23: 1 via TOPICAL

## 2018-04-23 MED ORDER — CHLORHEXIDINE GLUCONATE CLOTH 2 % EX PADS: 6.0000 | MEDICATED_PAD | Freq: Once | CUTANEOUS | Status: DC

## 2018-04-23 MED ORDER — GABAPENTIN 300 MG PO CAPS
300.0000 mg | ORAL_CAPSULE | ORAL | Status: AC
  Administered 2018-04-23: 300 mg via ORAL
  Filled 2018-04-23: qty 1

## 2018-04-23 MED ORDER — LACTATED RINGERS IV SOLN
INTRAVENOUS | Status: DC
  Administered 2018-04-23 (×2): via INTRAVENOUS

## 2018-04-23 MED ORDER — OXYCODONE HCL 5 MG PO TABS: 5.0000 mg | ORAL_TABLET | Freq: Four times a day (QID) | ORAL | 0 refills | Status: DC | PRN

## 2018-04-23 MED ORDER — MIDAZOLAM HCL 2 MG/2ML IJ SOLN
INTRAMUSCULAR | Status: AC
  Administered 2018-04-23: 2 mg via INTRAVENOUS
  Filled 2018-04-23: qty 2

## 2018-04-23 MED ORDER — CELECOXIB 200 MG PO CAPS
400.0000 mg | ORAL_CAPSULE | ORAL | Status: AC
  Administered 2018-04-23: 400 mg via ORAL
  Filled 2018-04-23: qty 2

## 2018-04-23 MED ORDER — SODIUM CHLORIDE (PF) 0.9 % IJ SOLN
INTRAVENOUS | Status: DC | PRN
  Administered 2018-04-23: 5 mL

## 2018-04-23 MED ORDER — MIDAZOLAM HCL 2 MG/2ML IJ SOLN
2.0000 mg | Freq: Once | INTRAMUSCULAR | Status: AC
  Administered 2018-04-23: 2 mg via INTRAVENOUS

## 2018-04-23 MED ORDER — SODIUM CHLORIDE 0.9 % IV SOLN
INTRAVENOUS | Status: DC | PRN
  Administered 2018-04-23: 40 ug/min via INTRAVENOUS

## 2018-04-23 MED ORDER — IBUPROFEN 800 MG PO TABS: 800.0000 mg | ORAL_TABLET | Freq: Three times a day (TID) | ORAL | 0 refills | Status: DC | PRN

## 2018-04-23 MED ORDER — FENTANYL CITRATE (PF) 250 MCG/5ML IJ SOLN
INTRAMUSCULAR | Status: AC
  Filled 2018-04-23: qty 5

## 2018-04-23 MED ORDER — ACETAMINOPHEN 325 MG PO TABS: 325.0000 mg | ORAL_TABLET | ORAL | Status: DC | PRN

## 2018-04-23 MED ORDER — MEPERIDINE HCL 50 MG/ML IJ SOLN: 6.2500 mg | INTRAMUSCULAR | Status: DC | PRN

## 2018-04-23 MED ORDER — 0.9 % SODIUM CHLORIDE (POUR BTL) OPTIME
TOPICAL | Status: DC | PRN
  Administered 2018-04-23: 1000 mL

## 2018-04-23 MED ORDER — MIDAZOLAM HCL 5 MG/5ML IJ SOLN
INTRAMUSCULAR | Status: DC | PRN
  Administered 2018-04-23: 2 mg via INTRAVENOUS

## 2018-04-23 MED ORDER — ONDANSETRON HCL 4 MG/2ML IJ SOLN: 4.0000 mg | Freq: Once | INTRAMUSCULAR | Status: DC | PRN

## 2018-04-23 MED ORDER — LIDOCAINE 2% (20 MG/ML) 5 ML SYRINGE
INTRAMUSCULAR | Status: DC | PRN
  Administered 2018-04-23: 40 mg via INTRAVENOUS

## 2018-04-23 MED ORDER — PROPOFOL 10 MG/ML IV BOLUS
INTRAVENOUS | Status: DC | PRN
  Administered 2018-04-23: 200 mg via INTRAVENOUS

## 2018-04-23 MED ORDER — ONDANSETRON HCL 4 MG/2ML IJ SOLN
INTRAMUSCULAR | Status: AC
  Filled 2018-04-23: qty 2

## 2018-04-23 MED ORDER — MIDAZOLAM HCL 2 MG/2ML IJ SOLN
INTRAMUSCULAR | Status: AC
  Filled 2018-04-23: qty 2

## 2018-04-23 MED ORDER — TECHNETIUM TC 99M SULFUR COLLOID FILTERED
1.0000 | Freq: Once | INTRAVENOUS | Status: AC | PRN
  Administered 2018-04-23: 1 via INTRADERMAL

## 2018-04-23 MED ORDER — ROPIVACAINE HCL 7.5 MG/ML IJ SOLN
INTRAMUSCULAR | Status: DC | PRN
  Administered 2018-04-23: 30 mL via PERINEURAL

## 2018-04-23 SURGICAL SUPPLY — 43 items
ADH SKN CLS APL DERMABOND .7 (GAUZE/BANDAGES/DRESSINGS) ×1
APPLIER CLIP 9.375 MED OPEN (MISCELLANEOUS) ×2
APR CLP MED 9.3 20 MLT OPN (MISCELLANEOUS) ×1
BINDER BREAST LRG (GAUZE/BANDAGES/DRESSINGS) ×1 IMPLANT
BINDER BREAST XLRG (GAUZE/BANDAGES/DRESSINGS) ×1 IMPLANT
CANISTER SUCT 3000ML PPV (MISCELLANEOUS) ×2 IMPLANT
CHLORAPREP W/TINT 26ML (MISCELLANEOUS) ×2 IMPLANT
CLIP APPLIE 9.375 MED OPEN (MISCELLANEOUS) ×1 IMPLANT
CONT SPEC 4OZ CLIKSEAL STRL BL (MISCELLANEOUS) ×2 IMPLANT
COVER PROBE W GEL 5X96 (DRAPES) ×2 IMPLANT
COVER SURGICAL LIGHT HANDLE (MISCELLANEOUS) ×2 IMPLANT
COVER WAND RF STERILE (DRAPES) IMPLANT
DERMABOND ADVANCED (GAUZE/BANDAGES/DRESSINGS) ×1
DERMABOND ADVANCED .7 DNX12 (GAUZE/BANDAGES/DRESSINGS) ×1 IMPLANT
DEVICE DUBIN SPECIMEN MAMMOGRA (MISCELLANEOUS) ×2 IMPLANT
DRAPE CHEST BREAST 15X10 FENES (DRAPES) ×2 IMPLANT
ELECT REM PT RETURN 9FT ADLT (ELECTROSURGICAL) ×2
ELECTRODE REM PT RTRN 9FT ADLT (ELECTROSURGICAL) ×1 IMPLANT
GLOVE BIO SURGEON STRL SZ8 (GLOVE) ×2 IMPLANT
GLOVE BIOGEL PI IND STRL 8 (GLOVE) ×1 IMPLANT
GLOVE BIOGEL PI INDICATOR 8 (GLOVE) ×1
GOWN STRL REUS W/ TWL LRG LVL3 (GOWN DISPOSABLE) ×1 IMPLANT
GOWN STRL REUS W/ TWL XL LVL3 (GOWN DISPOSABLE) ×1 IMPLANT
GOWN STRL REUS W/TWL LRG LVL3 (GOWN DISPOSABLE) ×2
GOWN STRL REUS W/TWL XL LVL3 (GOWN DISPOSABLE) ×2
KIT BASIN OR (CUSTOM PROCEDURE TRAY) ×2 IMPLANT
KIT MARKER MARGIN INK (KITS) ×2 IMPLANT
LIGHT WAVEGUIDE WIDE FLAT (MISCELLANEOUS) IMPLANT
NDL 18GX1X1/2 (RX/OR ONLY) (NEEDLE) IMPLANT
NDL FILTER BLUNT 18X1 1/2 (NEEDLE) IMPLANT
NDL HYPO 25GX1X1/2 BEV (NEEDLE) ×1 IMPLANT
NEEDLE 18GX1X1/2 (RX/OR ONLY) (NEEDLE) IMPLANT
NEEDLE FILTER BLUNT 18X 1/2SAF (NEEDLE)
NEEDLE FILTER BLUNT 18X1 1/2 (NEEDLE) IMPLANT
NEEDLE HYPO 25GX1X1/2 BEV (NEEDLE) ×2 IMPLANT
NS IRRIG 1000ML POUR BTL (IV SOLUTION) ×2 IMPLANT
PACK GENERAL/GYN (CUSTOM PROCEDURE TRAY) ×2 IMPLANT
SURGICEL SNOW 2X4 (HEMOSTASIS) ×1 IMPLANT
SUT MNCRL AB 4-0 PS2 18 (SUTURE) ×2 IMPLANT
SUT VIC AB 3-0 SH 18 (SUTURE) ×2 IMPLANT
SYR CONTROL 10ML LL (SYRINGE) ×2 IMPLANT
TOWEL OR 17X24 6PK STRL BLUE (TOWEL DISPOSABLE) ×2 IMPLANT
TOWEL OR 17X26 10 PK STRL BLUE (TOWEL DISPOSABLE) ×2 IMPLANT

## 2018-04-23 NOTE — Discharge Instructions (Signed)
[facility] °Office Phone Number [phone] ° °BREAST BIOPSY/ PARTIAL MASTECTOMY: POST OP INSTRUCTIONS ° °Always review your discharge instruction sheet given to you by the facility where your surgery was performed. ° °IF YOU HAVE DISABILITY OR FAMILY LEAVE FORMS, YOU MUST BRING THEM TO THE OFFICE FOR PROCESSING.  DO NOT GIVE THEM TO YOUR DOCTOR. ° °1. A prescription for pain medication may be given to you upon discharge.  Take your pain medication as prescribed, if needed.  If narcotic pain medicine is not needed, then you may take acetaminophen (Tylenol) or ibuprofen (Advil) as needed. °2. Take your usually prescribed medications unless otherwise directed °3. If you need a refill on your pain medication, please contact your pharmacy.  They will contact our office to request authorization.  Prescriptions will not be filled after 5pm or on week-ends. °4. You should eat very light the first 24 hours after surgery, such as soup, crackers, pudding, etc.  Resume your normal diet the day after surgery. °5. Most patients will experience some swelling and bruising in the breast.  Ice packs and a good support bra will help.  Swelling and bruising can take several days to resolve.  °6. It is common to experience some constipation if taking pain medication after surgery.  Increasing fluid intake and taking a stool softener will usually help or prevent this problem from occurring.  A mild laxative (Milk of Magnesia or Miralax) should be taken according to package directions if there are no bowel movements after 48 hours. °7. Unless discharge instructions indicate otherwise, you may remove your bandages 24-48 hours after surgery, and you may shower at that time.  You may have steri-strips (small skin tapes) in place directly over the incision.  These strips should be left on the skin for 7-10 days.  If your surgeon used skin glue on the incision, you may shower in 24 hours.  The glue will flake off over the  next 2-3 weeks.  Any sutures or staples will be removed at the office during your follow-up visit. °8. ACTIVITIES:  You may resume regular daily activities (gradually increasing) beginning the next day.  Wearing a good support bra or sports bra minimizes pain and swelling.  You may have sexual intercourse when it is comfortable. °a. You may drive when you no longer are taking prescription pain medication, you can comfortably wear a seatbelt, and you can safely maneuver your car and apply brakes. °b. RETURN TO WORK:  ______________________________________________________________________________________ °9. You should see your doctor in the office for a follow-up appointment approximately two weeks after your surgery.  Your doctor’s nurse will typically make your follow-up appointment when she calls you with your pathology report.  Expect your pathology report 2-3 business days after your surgery.  You may call to check if you do not hear from us after three days. °10. OTHER INSTRUCTIONS: _______________________________________________________________________________________________ _____________________________________________________________________________________________________________________________________ °_____________________________________________________________________________________________________________________________________ °_____________________________________________________________________________________________________________________________________ ° °WHEN TO CALL YOUR DOCTOR: °1. Fever over 101.0 °2. Nausea and/or vomiting. °3. Extreme swelling or bruising. °4. Continued bleeding from incision. °5. Increased pain, redness, or drainage from the incision. ° °The clinic staff is available to answer your questions during regular business hours.  Please don’t hesitate to call and ask to speak to one of the nurses for clinical concerns.  If you have a medical emergency, go to the nearest  emergency room or call 911.  A surgeon from [facility] is always on call at the hospital. ° °For further questions, please visit [email or web address]  °

## 2018-04-23 NOTE — Interval H&P Note (Signed)
History and Physical Interval Note:  04/23/2018 1:33 PM  Sandra [name]  has presented today for surgery, with the diagnosis of right breast cancer  The various methods of treatment have been discussed with the patient and family. After consideration of risks, benefits and other options for treatment, the patient has consented to  Procedure(s): RIGHT BREAST WITH RADIOACTIVE SEED LUMPECTOMY x3 AND SENTINEL [place] (Right) as a surgical intervention .  The patient's history has been reviewed, patient examined, no change in status, stable for surgery.  I have reviewed the patient's chart and labs.  Questions were answered to the patient's satisfaction.     [name]

## 2018-04-23 NOTE — Transfer of Care (Signed)
Immediate Anesthesia Transfer of Care Note  Patient: Sandra [name]  Procedure(s) Performed: RIGHT BREAST WITH RADIOACTIVE SEED LUMPECTOMY x3 AND SENTINEL LYMPH NODE MAPPING (Right Breast)  Patient Location: PACU  Anesthesia Type:GA combined with regional for post-op pain  Level of Consciousness: awake, alert  and oriented  Airway & Oxygen Therapy: Patient Spontanous Breathing and Patient connected to nasal cannula oxygen  Post-op Assessment: Report given to RN and Post -op Vital signs reviewed and stable  Post vital signs: Reviewed and stable  Last Vitals:  Vitals Value Taken Time  BP 112/62 04/23/2018  3:38 PM  Temp    Pulse 80 04/23/2018  3:40 PM  Resp 15 04/23/2018  3:40 PM  SpO2 100 % 04/23/2018  3:40 PM  Vitals shown include unvalidated device data.  Last Pain:  Vitals:   04/23/18 1535  TempSrc:   PainSc: (P) 0-No pain      Patients Stated Pain Goal: 5 (11/57/26 2035)  Complications: No apparent anesthesia complications

## 2018-04-23 NOTE — Anesthesia Preprocedure Evaluation (Signed)
Anesthesia Evaluation  Patient identified by MRN, date of birth, ID band Patient awake    Reviewed: Allergy & Precautions, NPO status , Patient's Chart, lab work & pertinent test results  Airway Mallampati: II  TM Distance: >3 FB Neck ROM: Full    Dental no notable dental hx.    Pulmonary neg pulmonary ROS,    Pulmonary exam normal breath sounds clear to auscultation       Cardiovascular negative cardio ROS Normal cardiovascular exam Rhythm:Regular Rate:Normal  ECHO: Left ventricle: The cavity size was normal. Systolic function was normal. The estimated ejection fraction was in the range of 55% to 60%. Wall motion was normal; there were no regional wall motion abnormalities. Left ventricular diastolic function parameters were normal.    Neuro/Psych  Headaches, PSYCHIATRIC DISORDERS Depression    GI/Hepatic negative GI ROS, Neg liver ROS,   Endo/Other  negative endocrine ROS  Renal/GU negative Renal ROS     Musculoskeletal negative musculoskeletal ROS (+)   Abdominal (+) + obese,   Peds  Hematology HLD   Anesthesia Other Findings breast cancer  Reproductive/Obstetrics                             Anesthesia Physical  Anesthesia Plan  ASA: III  Anesthesia Plan: General   Post-op Pain Management:  Regional for Post-op pain   Induction: Intravenous  PONV Risk Score and Plan: 3 and Ondansetron, Dexamethasone, Midazolam and Treatment may vary due to age or medical condition  Airway Management Planned: LMA  Additional Equipment:   Intra-op Plan:   Post-operative Plan: Extubation in OR  Informed Consent: I have reviewed the patients History and Physical, chart, labs and discussed the procedure including the risks, benefits and alternatives for the proposed anesthesia with the patient or authorized representative who has indicated his/her understanding and acceptance.     Dental  advisory given  Plan Discussed with: CRNA, Anesthesiologist and Surgeon  Anesthesia Plan Comments:         Anesthesia Quick Evaluation

## 2018-04-23 NOTE — Anesthesia Procedure Notes (Signed)
Anesthesia Regional Block: Pectoralis block   Pre-Anesthetic Checklist: ,, timeout performed, Correct Patient, Correct Site, Correct Laterality, Correct Procedure, Correct Position, site marked, Risks and benefits discussed,  Surgical consent,  Pre-op evaluation,  At surgeon's request and post-op pain management  Laterality: Right  Prep: chloraprep       Needles:  Injection technique: Single-shot  Needle Type: Echogenic Stimulator Needle     Needle Length: 5cm  Needle Gauge: 22     Additional Needles:   Procedures:, nerve stimulator,,, ultrasound used (permanent image in chart),,,,  Narrative:  Start time: 04/23/2018 1:07 PM End time: 04/23/2018 1:12 PM Injection made incrementally with aspirations every 5 mL.  Performed by: Personally  Anesthesiologist: Janeece Riggers, MD  Additional Notes: Functioning IV was confirmed and monitors were applied.  A 59mm 22ga Arrow echogenic stimulator needle was used. Sterile prep and drape,hand hygiene and sterile gloves were used. Ultrasound guidance: relevant anatomy identified, needle position confirmed, local anesthetic spread visualized around nerve(s)., vascular puncture avoided.  Image printed for medical record. Negative aspiration and negative test dose prior to incremental administration of local anesthetic. The patient tolerated the procedure well.

## 2018-04-23 NOTE — Anesthesia Postprocedure Evaluation (Signed)
Anesthesia Post Note  Patient: Sandra [name]  Procedure(s) Performed: RIGHT BREAST WITH RADIOACTIVE SEED LUMPECTOMY x3 AND SENTINEL LYMPH NODE MAPPING (Right Breast)     Patient location during evaluation: PACU Anesthesia Type: General and Regional Level of consciousness: awake and alert Pain management: pain level controlled Vital Signs Assessment: post-procedure vital signs reviewed and stable Respiratory status: spontaneous breathing, nonlabored ventilation and respiratory function stable Cardiovascular status: blood pressure returned to baseline and stable Postop Assessment: no apparent nausea or vomiting Anesthetic complications: no    Last Vitals:  Vitals:   04/23/18 1545 04/23/18 1600  BP:    Pulse: 74 79  Resp: 14 16  Temp:  36.6 C  SpO2: 96% 100%    Last Pain:  Vitals:   04/23/18 1600  TempSrc:   PainSc: 3                  Basheer Molchan,W. EDMOND

## 2018-04-23 NOTE — Anesthesia Procedure Notes (Signed)
Procedure Name: LMA Insertion Date/Time: 04/23/2018 2:06 PM Performed by: Valda Favia, CRNA Pre-anesthesia Checklist: Patient identified, Emergency Drugs available, Suction available and Patient being monitored Patient Re-evaluated:Patient Re-evaluated prior to induction Oxygen Delivery Method: Circle System Utilized Preoxygenation: Pre-oxygenation with 100% oxygen Induction Type: IV induction Ventilation: Mask ventilation without difficulty LMA: LMA inserted LMA Size: 4.0 Number of attempts: 1 Airway Equipment and Method: Bite block Placement Confirmation: positive ETCO2 and breath sounds checked- equal and bilateral Tube secured with: Tape Dental Injury: Teeth and Oropharynx as per pre-operative assessment

## 2018-04-23 NOTE — OR Nursing (Signed)
Assumed care of patient at 1500 hours.

## 2018-04-23 NOTE — Op Note (Signed)
Preoperative diagnosis: Stage II right breast cancer  Postoperative diagnosis: Same  Procedure: Right breast seed localized lumpectomy x3 and right axillary sentinel lymph node mapping of deep right axillary sentinel nodes using methylene blue dye  Surgeon: Erroll Luna, MD  Anesthesia: LMA with pectoral block local  EBL: 30 cc  Specimen: Right breast tissue with 3 seeds and 3 clips verified by Faxitron #1 #2 2 right axillary sentinel nodes blue and hot  Drains: None  IV fluids: Per anesthesia record  Indications for procedure: The patient is a 53 year old female who presents for right breast lumpectomy after neoadjuvant chemotherapy.  She had good response to her tumor presents for breast conservation surgery.  Risk, benefits and other surgical options and/or treatments were discussed with the patient.The procedure has been discussed with the patient. Alternatives to surgery have been discussed with the patient.  Risks of surgery include bleeding,  Infection,  Seroma formation, death,  and the need for further surgery.   The patient understands and wishes to proceed.Sentinel lymph node mapping and dissection has been discussed with the patient.  Risk of bleeding,  Infection,  Seroma formation,  Additional procedures,,  Shoulder weakness ,  Shoulder stiffness,  Nerve and blood vessel injury and reaction to the mapping dyes have been discussed.  Alternatives to surgery have been discussed with the patient.  The patient agrees to proceed.   Description of procedure: The patient was met in the holding area.  All 3 seeds were localized with the neoprobe and marked.  Timeout was done.  Right breast was marked as correct side.  All questions were answered.  She underwent pectoral block and injection of technetium sulfur colloid by radiology.  She was then taken back the operating room.  She was placed supine upon the operating room table.  After induction of LMA anesthesia the right breast was  prepped and draped in sterile fashion timeout was done.  4 cc of methylene blue dye were injected in a subareolar position and massaged for 5 minutes.  Neoprobe was used and all 3 seeds were localized in the right breast upper outer quadrant.  Curvilinear incision was made over the area.  All tissue containing the seed was excised with grossly negative margins.  Faxitron revealed all 3 seeds and clips to be in the specimen.  The breast was then mobilized to facilitate closure.  This was done with cautery.  We then closed with 3-0 Vicryl and 4-0 Monocryl.  The neoprobe settings were changed to technetium.  The right axilla was examined.  A 4 cm incision was made the right axilla.  Dissection was carried down into the level 1 x-ray contents.  She had a blue hot sentinel node in the right axillary contents level 1.  She had a second blue hot node in the level 2 contents this was carefully dissected out to preserve the long thoracic nerve, thoracodorsal trunk and the axillary vein.  These were all preserved.  The node was excised and background counts then approached 0.  Hemostasis achieved using small clips for small lymphatics and bleeding vessels and Surgicel snow.  Irrigation was used prior to this and this was clear with no signs of bleeding.  We then closed the wound with 3-0 Vicryl and 4-0 Monocryl.  Dermabond applied to both.  Breast binder placed.  All final counts were found to be correct of sponge, needles and instruments.  The patient was awoke extubated taken to recovery in satisfactory condition.

## 2018-04-24 ENCOUNTER — Encounter (HOSPITAL_COMMUNITY): Payer: Self-pay | Admitting: Surgery

## 2018-04-29 NOTE — Progress Notes (Addendum)
Patient Care Team: Briscoe Deutscher, DO as PCP - General (Family Medicine) Macario Carls, MD as Referring Physician (Specialist) Erroll Luna, MD as Consulting Physician (General Surgery) Nicholas Lose, MD as Consulting Physician (Hematology and Oncology) Kyung Rudd, MD as Consulting Physician (Radiation Oncology)  DIAGNOSIS:    ICD-10-CM   1. Malignant neoplasm of upper-outer quadrant of right breast in female, estrogen receptor positive ([place]) C50.411    Z17.0     SUMMARY OF ONCOLOGIC HISTORY:   Malignant neoplasm of upper-outer quadrant of right breast in female, estrogen receptor positive ([place])   11/11/2017 Initial Diagnosis    Palpable right breast mass at 10 o'clock position 1.7 cm, axilla negative, biopsy revealed grade 3 IDC ER 60%, PR 10%, Ki-67 70%, HER-2 positive, T1 CN 0 stage Ia    11/19/2017 Cancer Staging    Staging form: Breast, AJCC 8th Edition - Clinical: Stage IB (cT2, cN0, cM0, G3, ER+, PR+, HER2+) - Signed by Nicholas Lose, MD on 12/04/2017    11/25/2017 Breast MRI    4.4 x 2.8 x 2.1 cm right breast malignancy UOQ, second focus 0.8 cm (biopsy planned for 12/09/2017)    12/04/2017 - 03/26/2018 Neo-Adjuvant Chemotherapy    TCH Perjeta every 3 week x6 followed by Herceptin Perjeta maintenance    01/04/2018 Genetic Testing    Negative genetic testing on the multi-cancer panel.  The Multi-Gene Panel offered by [facility] includes sequencing and/or deletion duplication testing of the following 84 genes: AIP, ALK, APC, ATM, AXIN2,BAP1,  BARD1, BLM, BMPR1A, BRCA1, BRCA2, BRIP1, CASR, CDC73, CDH1, CDK4, CDKN1B, CDKN1C, CDKN2A (p14ARF), CDKN2A (p16INK4a), CEBPA, CHEK2, CTNNA1, DICER1, DIS3L2, EGFR (c.2369C>T, p.Thr790Met variant only), EPCAM (Deletion/duplication testing only), FH, FLCN, GATA2, GPC3, GREM1 (Promoter region deletion/duplication testing only), HOXB13 (c.251G>A, p.Gly84Glu), HRAS, KIT, MAX, MEN1, MET, MITF (c.952G>A, p.Glu318Lys variant only), MLH1, MSH2, MSH3, MSH6,  MUTYH, NBN, NF1, NF2, NTHL1, PALB2, PDGFRA, PHOX2B, PMS2, POLD1, POLE, POT1, PRKAR1A, PTCH1, PTEN, RAD50, RAD51C, RAD51D, RB1, RECQL4, RET, RUNX1, SDHAF2, SDHA (sequence changes only), SDHB, SDHC, SDHD, SMAD4, SMARCA4, SMARCB1, SMARCE1, STK11, SUFU, TERC, TERT, TMEM127, TP53, TSC1, TSC2, VHL, WRN and WT1.  The report date is January 04, 2018.    04/23/2018 Surgery    Right lumpectomy: Small residual invasive cancer multifocal 0.15 cm, grade 2, 0/3 lymph nodes all negative, ER 90%, PR 60%, HER-2 +3+ by IHC, Ki-67 20%, RCB class I, ympT1a, ypN0     CHIEF COMPLIANT: Follow-up s/p lumpectomy to review pathology  INTERVAL HISTORY: Sandra [name] is a 53 y.o. with above-mentioned history of HER-2 positive right breast cancer who underwent neoadjuvant chemotherapy with [place] followed by a right lumpectomy on 04/23/18 for which pathology showed the tumor shrunk to 0.15cm, HER2 positive, ER 80%, PR negative, with no lymph node involvement. She presents to the clinic today with her husband and is healing well from surgery and taking tylenol and ibuprofen for pain. She lost about 30lbs through treatment and has gained back 9lbs. She questioned if she needed to continue taking a potassium supplement and if she could eat eggs over easy.   REVIEW OF SYSTEMS:   Constitutional: Denies fevers, chills (+) abnormal weight loss Eyes: Denies blurriness of vision Ears, nose, mouth, throat, and face: Denies mucositis or sore throat Respiratory: Denies cough, dyspnea or wheezes Cardiovascular: Denies palpitation, chest discomfort Gastrointestinal:  Denies nausea, heartburn or change in bowel habits Skin: Denies abnormal skin rashes Lymphatics: Denies new lymphadenopathy or easy bruising Neurological: Denies numbness, tingling or new weaknesses Behavioral/Psych: Mood  is stable, no new changes  Extremities: No lower extremity edema Breast: denies any pain or lumps or nodules in either breasts All other systems were  reviewed with the patient and are negative.  I have reviewed the past medical history, past surgical history, social history and family history with the patient and they are unchanged from previous note.  ALLERGIES:  has No Known Allergies.  MEDICATIONS:  Current Outpatient Medications  Medication Sig Dispense Refill  . betamethasone valerate ointment (VALISONE) 0.1 % Apply bid for 1-2 weeks as needed for flare of lichen sclerosis. 15 g 1  . ibuprofen (ADVIL,MOTRIN) 800 MG tablet Take 1 tablet (800 mg total) by mouth every 8 (eight) hours as needed. 30 tablet 0  . ibuprofen (ADVIL,MOTRIN) 800 MG tablet Take 1 tablet (800 mg total) by mouth every 8 (eight) hours as needed. 30 tablet 0  . lidocaine-prilocaine (EMLA) cream Apply 1 application topically daily as needed (port use).   5  . LORazepam (ATIVAN) 1 MG tablet Take 1 tablet (1 mg total) by mouth every 8 (eight) hours as needed for anxiety (or nausea). 30 tablet 0  . ondansetron (ZOFRAN) 8 MG tablet TAKE 1 TABLET (8 MG TOTAL) BY MOUTH 2 (TWO) TIMES DAILY AS NEEDED (NAUSEA OR VOMITING). (Patient not taking: Reported on 04/07/2018) 30 tablet 1  . oxyCODONE (OXY IR/ROXICODONE) 5 MG immediate release tablet Take 1 tablet (5 mg total) by mouth every 6 (six) hours as needed for severe pain. 15 tablet 0  . pantoprazole (PROTONIX) 40 MG tablet Take 1 tablet (40 mg total) by mouth daily. 30 tablet 1  . potassium chloride (MICRO-K) 10 MEQ CR capsule Take 2 capsules (20 mEq total) by mouth daily. (Patient not taking: Reported on 04/07/2018) 60 capsule 3  . prochlorperazine (COMPAZINE) 10 MG tablet Take 1 tablet (10 mg total) by mouth every 6 (six) hours as needed (Nausea or vomiting). 30 tablet 1  . venlafaxine XR (EFFEXOR-XR) 75 MG 24 hr capsule Take 1 capsule (75 mg total) by mouth 2 (two) times daily. 180 capsule 0   No current facility-administered medications for this visit.     PHYSICAL EXAMINATION: ECOG PERFORMANCE STATUS: 1 - Symptomatic but  completely ambulatory  Vitals:   04/30/18 1352  BP: 137/75  Pulse: 99  Resp: 18  Temp: 98.6 F (37 C)  SpO2: 100%   Filed Weights   04/30/18 1352  Weight: 86 kg    GENERAL: Pain related to recent left mastectomy SKIN: skin color, texture, turgor are normal, no rashes or significant lesions EYES: normal, Conjunctiva are pink and non-injected, sclera clear OROPHARYNX: no exudate, no erythema and lips, buccal mucosa, and tongue normal  NECK: supple, thyroid normal size, non-tender, without nodularity LYMPH: no palpable lymphadenopathy in the cervical, axillary or inguinal LUNGS: clear to auscultation and percussion with normal breathing effort HEART: regular rate & rhythm and no murmurs and no lower extremity edema ABDOMEN: abdomen soft, non-tender and normal bowel sounds MUSCULOSKELETAL: no cyanosis of digits and no clubbing  NEURO: alert & oriented x 3 with fluent speech, no focal motor/sensory deficits EXTREMITIES: No lower extremity edema  LABORATORY DATA:  I have reviewed the data as listed CMP Latest Ref Rng & Units 04/17/2018 03/26/2018 03/05/2018  Glucose 70 - 99 mg/dL 90 133(H) 87  BUN 6 - 20 mg/dL _0 Creatinine 0.44 - 1.00 mg/dL 0.84 0.74 0.79  Sodium 135 - 145 mmol/L 138 139 140  Potassium 3.5 - 5.1 mmol/L 3.6 3.5  2.9(LL)  Chloride 98 - 111 mmol/L 103 105 103  CO2 22 - 32 mmol/L _0 Calcium 8.9 - 10.3 mg/dL 8.9 8.8(L) 9.3  Total Protein 6.5 - 8.1 g/dL 6.1(L) 6.2(L) 6.6  Total Bilirubin 0.3 - 1.2 mg/dL 0.3 0.2(L) 0.4  Alkaline Phos 38 - 126 U/L 47 49 62  AST 15 - 41 U/L _1 ALT 0 - 44 U/L _2 Lab Results  Component Value Date   WBC 3.5 (L) 04/17/2018   HGB 10.5 (L) 04/17/2018   HCT 33.9 (L) 04/17/2018   MCV 103.0 (H) 04/17/2018   PLT 177 04/17/2018   NEUTROABS 1.8 04/17/2018    ASSESSMENT & PLAN:  Malignant neoplasm of upper-outer quadrant of right breast in female, estrogen receptor positive ([place]) 11/11/2017:Palpable right  breast mass at 10 o'clock position 1.7 cm, axilla negative, biopsy revealed grade 3 IDC ER 60%, PR 10%, Ki-67 70%, HER-2 positive, T1 CN 0 stage Ia Breast MRI 11/25/2017: 4.4 x 2.8 x 2.1 cm right breast malignancy UOQ, second focus 0.8 cm(biopsy plannedfor 12/09/2017), overall extent of disease is 7 cm 12/12/2017:Biopsy results of the MRI guided biopsies 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  Treatment plan: 1.Neoadjuvant chemotherapy with [place] Perjeta 12/04/2017-12/26/2019followed by Herceptin and Perjetamaintenance for 1 year 2.breast conserving surgery with sentinel lymph node biopsy: 04/23/2018 3.+/- Radiation therapy: We will discuss in tumor board 4.Adjuvant antiestrogen therapy -------------------------------------------------------------------------------------------------------------------------------- 04/23/2018:Right lumpectomy: Small residual invasive cancer multifocal 0.15 cm, grade 2, 0/3 lymph nodes all negative, ER 90%, PR 60%, HER-2 +3+ by IHC, Ki-67 20%, RCB class I, ympT1a, ypN0  Pathology counseling: I discussed the final pathology report of the patient provided  a copy of this report. I discussed the margins as well as lymph node surgeries. We also discussed the final staging along with previously performed ER/PR and HER-2/neu testing.  Patient had a fantastic response to neoadjuvant chemotherapy. Recommendation: Herceptin Perjeta maintenance and we will refer her for radiation therapy. She will continue every 3-week Herceptin Perjeta I will follow her with labs every 6 weeks.    No orders of the defined types were placed in this encounter.  The patient has a good understanding of the overall plan. she agrees with it. she will call with any problems that may develop before the next visit here.  Nicholas Lose, MD 04/30/2018  Julious Oka Dorshimer am acting as scribe for Dr. Nicholas Lose.  I have reviewed the above documentation for accuracy  and completeness, and I agree with the above.

## 2018-04-30 ENCOUNTER — Inpatient Hospital Stay: Payer: BLUE CROSS/BLUE SHIELD

## 2018-04-30 ENCOUNTER — Inpatient Hospital Stay (HOSPITAL_BASED_OUTPATIENT_CLINIC_OR_DEPARTMENT_OTHER): Payer: BLUE CROSS/BLUE SHIELD | Admitting: Hematology and Oncology

## 2018-04-30 VITALS — BP 137/75 | HR 99 | Temp 98.6°F | Resp 18 | Ht 71.0 in | Wt 189.6 lb

## 2018-04-30 DIAGNOSIS — C50411 Malignant neoplasm of upper-outer quadrant of right female breast: Secondary | ICD-10-CM

## 2018-04-30 DIAGNOSIS — Z170 Estrogen receptor positive status [ER+]: Secondary | ICD-10-CM | POA: Diagnosis not present

## 2018-04-30 DIAGNOSIS — Z5112 Encounter for antineoplastic immunotherapy: Secondary | ICD-10-CM | POA: Diagnosis not present

## 2018-04-30 DIAGNOSIS — E876 Hypokalemia: Secondary | ICD-10-CM | POA: Diagnosis not present

## 2018-04-30 NOTE — Progress Notes (Signed)
DISCONTINUE ON PATHWAY REGIMEN - Breast     A cycle is every 21 days:     Pertuzumab      Pertuzumab      Trastuzumab-xxxx      Trastuzumab-xxxx      Carboplatin      Docetaxel   **Always confirm dose/schedule in your pharmacy ordering system**  REASON: Other Reason PRIOR TREATMENT: [id number]: Docetaxel + Carboplatin + Trastuzumab + Pertuzumab (TCHP) q21 Days x 6 Cycles TREATMENT RESPONSE: N/A - Adjuvant Therapy  START ON PATHWAY REGIMEN - Breast     A cycle is every 21 days:     Ado-trastuzumab emtansine   **Always confirm dose/schedule in your pharmacy ordering system**  Patient Characteristics: Post-Neoadjuvant Therapy and Resection, HER2 Positive, ER Positive, Residual Disease, Adjuvant Targeted Therapy After Neoadjuvant Chemo/Targeted Therapy Therapeutic Status: Post-Neoadjuvant Therapy and Resection ER Status: Positive (+) HER2 Status: Positive (+) PR Status: Positive (+) Residual Invasive Disease Post-Neoadjuvant Therapy<= Yes Intent of Therapy: Curative Intent, Discussed with Patient

## 2018-04-30 NOTE — Assessment & Plan Note (Signed)
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  Treatment plan: 1.Neoadjuvant chemotherapy with [place] Perjeta 12/04/2017-12/26/2019followed by Herceptin and Perjetamaintenance for 1 year 2.breast conserving surgery with sentinel lymph node biopsy: 04/23/2018 3.Radiation therapy 4.Adjuvant antiestrogen therapy -------------------------------------------------------------------------------------------------------------------------------- 04/23/2018:Right lumpectomy: Small residual invasive cancer multifocal 0.15 cm, grade 2, 0/3 lymph nodes all negative, ER 90%, PR 60%, HER-2 +3+ by IHC, Ki-67 20%, RCB class I, ympT1a, ypN0  Pathology counseling: I discussed the final pathology report of the patient provided  a copy of this report. I discussed the margins as well as lymph node surgeries. We also discussed the final staging along with previously performed ER/PR and HER-2/neu testing.  Patient had a fantastic response to neoadjuvant chemotherapy. Recommendation: Herceptin Perjeta maintenance and we will refer her for radiation therapy. She will continue every 3-week Herceptin Perjeta I will follow her with labs every 6 weeks.

## 2018-05-06 ENCOUNTER — Other Ambulatory Visit: Payer: Self-pay | Admitting: *Deleted

## 2018-05-06 DIAGNOSIS — Z170 Estrogen receptor positive status [ER+]: Secondary | ICD-10-CM

## 2018-05-06 DIAGNOSIS — C50411 Malignant neoplasm of upper-outer quadrant of right female breast: Secondary | ICD-10-CM

## 2018-05-07 ENCOUNTER — Inpatient Hospital Stay: Payer: BLUE CROSS/BLUE SHIELD

## 2018-05-07 ENCOUNTER — Inpatient Hospital Stay: Payer: BLUE CROSS/BLUE SHIELD | Attending: Hematology and Oncology

## 2018-05-07 ENCOUNTER — Encounter: Payer: Self-pay | Admitting: *Deleted

## 2018-05-07 VITALS — BP 109/82 | HR 96 | Temp 98.5°F | Resp 18

## 2018-05-07 DIAGNOSIS — Z170 Estrogen receptor positive status [ER+]: Secondary | ICD-10-CM

## 2018-05-07 DIAGNOSIS — Z95828 Presence of other vascular implants and grafts: Secondary | ICD-10-CM

## 2018-05-07 DIAGNOSIS — C50411 Malignant neoplasm of upper-outer quadrant of right female breast: Secondary | ICD-10-CM

## 2018-05-07 DIAGNOSIS — Z5112 Encounter for antineoplastic immunotherapy: Secondary | ICD-10-CM | POA: Insufficient documentation

## 2018-05-07 LAB — CMP (CANCER CENTER ONLY)
ALT: 50 U/L — ABNORMAL HIGH (ref 0–44)
AST: 41 U/L (ref 15–41)
Albumin: 3.6 g/dL (ref 3.5–5.0)
Alkaline Phosphatase: 62 U/L (ref 38–126)
Anion gap: 11 (ref 5–15)
BUN: 12 mg/dL (ref 6–20)
CO2: 25 mmol/L (ref 22–32)
Calcium: 9.3 mg/dL (ref 8.9–10.3)
Chloride: 104 mmol/L (ref 98–111)
Creatinine: 0.82 mg/dL (ref 0.44–1.00)
GFR, Est AFR Am: >60 mL/min (ref >60)
GFR, Estimated: >60 mL/min (ref >60)
Glucose, Bld: 124 mg/dL — ABNORMAL HIGH (ref 70–99)
Potassium: 3.1 mmol/L — ABNORMAL LOW (ref 3.5–5.1)
Sodium: 140 mmol/L (ref 135–145)
Total Bilirubin: 0.3 mg/dL (ref 0.3–1.2)
Total Protein: 6.8 g/dL (ref 6.5–8.1)

## 2018-05-07 LAB — CBC WITH DIFFERENTIAL (CANCER CENTER ONLY)
Abs Immature Granulocytes: 0 10*3/uL (ref 0.00–0.07)
Basophils Absolute: 0 10*3/uL (ref 0.0–0.1)
Basophils Relative: 0 %
Eosinophils Absolute: 0.1 10*3/uL (ref 0.0–0.5)
Eosinophils Relative: 3 %
HCT: 32.9 % — ABNORMAL LOW (ref 36.0–46.0)
Hemoglobin: 10.8 g/dL — ABNORMAL LOW (ref 12.0–15.0)
Immature Granulocytes: 0 %
Lymphocytes Relative: 35 %
Lymphs Abs: 1.4 10*3/uL (ref 0.7–4.0)
MCH: 32.8 pg (ref 26.0–34.0)
MCHC: 32.8 g/dL (ref 30.0–36.0)
MCV: 100 fL (ref 80.0–100.0)
Monocytes Absolute: 0.4 10*3/uL (ref 0.1–1.0)
Monocytes Relative: 10 %
Neutro Abs: 2.1 10*3/uL (ref 1.7–7.7)
Neutrophils Relative %: 52 %
Platelet Count: 226 10*3/uL (ref 150–400)
RBC: 3.29 MIL/uL — ABNORMAL LOW (ref 3.87–5.11)
RDW: 13.3 % (ref 11.5–15.5)
WBC Count: 3.9 10*3/uL — ABNORMAL LOW (ref 4.0–10.5)
nRBC: 0 % (ref 0.0–0.2)

## 2018-05-07 MED ORDER — SODIUM CHLORIDE 0.9% FLUSH
10.0000 mL | INTRAVENOUS | Status: DC | PRN
  Administered 2018-05-07: 10 mL
  Filled 2018-05-07: qty 10

## 2018-05-07 MED ORDER — ACETAMINOPHEN 325 MG PO TABS
650.0000 mg | ORAL_TABLET | Freq: Once | ORAL | Status: AC
  Administered 2018-05-07: 650 mg via ORAL

## 2018-05-07 MED ORDER — DIPHENHYDRAMINE HCL 25 MG PO CAPS
50.0000 mg | ORAL_CAPSULE | Freq: Once | ORAL | Status: AC
  Administered 2018-05-07: 50 mg via ORAL

## 2018-05-07 MED ORDER — SODIUM CHLORIDE 0.9 % IV SOLN
3.6000 mg/kg | Freq: Once | INTRAVENOUS | Status: AC
  Administered 2018-05-07: 300 mg via INTRAVENOUS
  Filled 2018-05-07: qty 15

## 2018-05-07 MED ORDER — HEPARIN SOD (PORK) LOCK FLUSH 100 UNIT/ML IV SOLN
500.0000 [IU] | Freq: Once | INTRAVENOUS | Status: AC | PRN
  Administered 2018-05-07: 500 [IU]
  Filled 2018-05-07: qty 5

## 2018-05-07 MED ORDER — SODIUM CHLORIDE 0.9 % IV SOLN
Freq: Once | INTRAVENOUS | Status: AC
  Administered 2018-05-07: 09:00:00 via INTRAVENOUS
  Filled 2018-05-07: qty 250

## 2018-05-07 NOTE — Progress Notes (Signed)
Received Cardiac MRI (done12/2/19) report from the study on 04/23/17.  Dr. Lindi Adie reviewed and signed/dated on 04/28/17. Non-alert findings except for one comment stating "Septal Hypokinesis" under LV Wall Motion.  Dr. Lindi Adie states this is clinically insignificant and no interventions required.   Copy of report given to patient today in infusion room.  Informed her Dr. Lindi Adie has reviewed and states no need for concern.  Patient verbalized understanding.  Foye Spurling, BSN, RN Clinical Research Nurse 05/07/2018 12:00 PM

## 2018-05-07 NOTE — Patient Instructions (Signed)
[facility] Discharge Instructions for Patients Receiving Chemotherapy  Today you received the following chemotherapy agents:  [place].  To help prevent nausea and vomiting after your treatment, we encourage you to take your nausea medication as directed.   If you develop nausea and vomiting that is not controlled by your nausea medication, call the clinic.   BELOW ARE SYMPTOMS THAT SHOULD BE REPORTED IMMEDIATELY:  *FEVER GREATER THAN 100.5 F  *CHILLS WITH OR WITHOUT FEVER  NAUSEA AND VOMITING THAT IS NOT CONTROLLED WITH YOUR NAUSEA MEDICATION  *UNUSUAL SHORTNESS OF BREATH  *UNUSUAL BRUISING OR BLEEDING  TENDERNESS IN MOUTH AND THROAT WITH OR WITHOUT PRESENCE OF ULCERS  *URINARY PROBLEMS  *BOWEL PROBLEMS  UNUSUAL RASH Items with * indicate a potential emergency and should be followed up as soon as possible.  Feel free to call the clinic should you have any questions or concerns. The clinic phone number is [phone].  Please show the [facility] at check-in to the Emergency Department and triage nurse.  Ado-Trastuzumab Emtansine for injection What is this medicine? ADO-TRASTUZUMAB EMTANSINE (ADD oh traz TOO zuh mab em TAN zine) is a monoclonal antibody combined with chemotherapy. It is used to treat breast cancer. This medicine may be used for other purposes; ask your health care provider or pharmacist if you have questions. COMMON BRAND NAME(S): Kadcyla What should I tell my health care provider before I take this medicine? They need to know if you have any of these conditions: -heart disease -heart failure -infection (especially a virus infection such as chickenpox, cold sores, or herpes) -liver disease -lung or breathing disease, like asthma -an unusual or allergic reaction to ado-trastuzumab emtansine, other medications, foods, dyes, or preservatives -pregnant or trying to get pregnant -breast-feeding How should I use this medicine? This  medicine is for infusion into a vein. It is given by a health care professional in a hospital or clinic setting. Talk to your pediatrician regarding the use of this medicine in children. Special care may be needed. Overdosage: If you think you have taken too much of this medicine contact a poison control center or emergency room at once. NOTE: This medicine is only for you. Do not share this medicine with others. What if I miss a dose? It is important not to miss your dose. Call your doctor or health care professional if you are unable to keep an appointment. What may interact with this medicine? This medicine may also interact with the following medications: -atazanavir -boceprevir -clarithromycin -delavirdine -indinavir -dalfopristin; quinupristin -isoniazid, INH -itraconazole -ketoconazole -nefazodone -nelfinavir -ritonavir -telaprevir -telithromycin -tipranavir -voriconazole This list may not describe all possible interactions. Give your health care provider a list of all the medicines, herbs, non-prescription drugs, or dietary supplements you use. Also tell them if you smoke, drink alcohol, or use illegal drugs. Some items may interact with your medicine. What should I watch for while using this medicine? Visit your doctor for checks on your progress. This drug may make you feel generally unwell. This is not uncommon, as chemotherapy can affect healthy cells as well as cancer cells. Report any side effects. Continue your course of treatment even though you feel ill unless your doctor tells you to stop. You may need blood work done while you are taking this medicine. Call your doctor or health care professional for advice if you get a fever, chills or sore throat, or other symptoms of a cold or flu. Do not treat yourself. This drug decreases your  body's ability to fight infections. Try to avoid being around people who are sick. Be careful brushing and flossing your teeth or using a  toothpick because you may get an infection or bleed more easily. If you have any dental work done, tell your dentist you are receiving this medicine. Avoid taking products that contain aspirin, acetaminophen, ibuprofen, naproxen, or ketoprofen unless instructed by your doctor. These medicines may hide a fever. Do not become pregnant while taking this medicine or for 7 months after stopping it, men with female partners should use contraception during treatment and for 4 months after the last dose. Women should inform their doctor if they wish to become pregnant or think they might be pregnant. There is a potential for serious side effects to an unborn child. Do not breast-feed an infant while taking this medicine or for 7 months after the last dose. Men who have a partner who is pregnant or who is capable of becoming pregnant should use a condom during sexual activity while taking this medicine and for 4 months after stopping it. Men should inform their doctors if they wish to father a child. This medicine may lower sperm counts. Talk to your health care professional or pharmacist for more information. What side effects may I notice from receiving this medicine? Side effects that you should report to your doctor or health care professional as soon as possible: -allergic reactions like skin rash, itching or hives, swelling of the face, lips, or tongue -breathing problems -chest pain or palpitations -fever or chills, sore throat -general ill feeling or flu-like symptoms -light-colored stools -nausea, vomiting -pain, tingling, numbness in the hands or feet -signs and symptoms of bleeding such as bloody or black, tarry stools; red or dark-brown urine; spitting up blood or brown material that looks like coffee grounds; red spots on the skin; unusual bruising or bleeding from the eye, gums, or nose -swelling of the legs or ankles -yellowing of the eyes or skin Side effects that usually do not require  medical attention (report to your doctor or health care professional if they continue or are bothersome): -changes in taste -constipation -dizziness -headache -joint pain -muscle pain -trouble sleeping -unusually weak or tired This list may not describe all possible side effects. Call your doctor for medical advice about side effects. You may report side effects to FDA at 1-800-FDA-1088. Where should I keep my medicine? This drug is given in a hospital or clinic and will not be stored at home. NOTE: This sheet is a summary. It may not cover all possible information. If you have questions about this medicine, talk to your doctor, pharmacist, or health care provider.  2019 Elsevier/Gold Standard (2015-05-08 12:11:06)

## 2018-05-13 NOTE — Progress Notes (Signed)
Location of Breast Cancer: Malignant neoplasm of upper outer quadrant of right breast, ER +,PR -  Did patient present with symptoms (if so, please note symptoms) or was this found on screening mammography?: Palpable right breast mass at 10 o'clock position 1.7 cm, axilla negative.  Breast MRI 11/25/2017: 4.4 x 2.8 x 2.1 cm right breast malignancy UOQ, second focus 0.8 cm.  Histology per Pathology Report: Surgical Path 04/23/2018   Receptor Status: ER(+ 80%), PR (- 0%), Her2-neu (+), Ki-()  Histology per Pathology Report: Biopsy R Breast 12/12/2017  Receptor Status: ER(+ 90%), PR (+ 60%), Her2-neu (+), Ki-67(20%)   Past/Anticipated interventions by surgeon, if any: Right lumpectomy 04/23/2018: small residual invasive cancer multifocal 0.15 cm, grade 2, 0/3 lymph nodes all negative, ER 90%, PR 60%, Her-2 +3+ by IHC, Ki-67 20%, RCB class I, ymp T1a, ypN0  Past/Anticipated interventions by medical oncology, if any: Chemotherapy  Dr. Lindi Adie 04/30/2018 - She underwent neoadjuvant chemotherapy followed by a right lumpectomy for which pathology showed the tumor shrunk to 0.15 cm, HER2 positive, ER 80%, PR negative, with no lymph node involvement. Treatment plan: 1.Neoadjuvant chemotherapy with [place] Perjeta 12/04/2017-12/26/2019followed by Herceptin and Perjetamaintenance for 1 year 2.breast conserving surgery with sentinel lymph node biopsy: 04/23/2018 3.+/- Radiation therapy: We will discuss in tumor board 4.Adjuvant antiestrogen therapy Recommendation: Herceptin Perjeta maintenance and we will refer her for radiation therapy. She will continue every 3-week Herceptin Perjeta I will follow her with labs every 6 weeks.  Lymphedema issues, if any: She denies. She has good arm mobility.   Pain issues, if any:  She denies.   Vitals BP 129/86 (BP Location: Left Arm)   Pulse 90   Temp 98.4 F (36.9 C) (Oral)   Ht _0  (1.803 m)   Wt 183 lb 9.6 oz (83.3 kg)   SpO2 99% Comment: room air   BMI 25.61 kg/m  Weights  Wt Readings from Last 3 Encounters:  05/14/18 183 lb 9.6 oz (83.3 kg)  04/30/18 189 lb 9.6 oz (86 kg)  04/23/18 182 lb (82.6 kg)    SAFETY ISSUES:  Prior radiation? No  Pacemaker/ICD? No  Possible current pregnancy? No. She denies having periods.   Is the patient on methotrexate? No  Current Complaints / other details:   -Genetics 01/04/2018- negative -Right chest port -Neo-adjuvant chemotherapy 12/04/2017-03/26/2018 [place] Perjeta every 3 weeks x6 followed by [place] Perjeta maintenance.       Cori Razor, RN 05/13/2018,9:34 AM

## 2018-05-14 ENCOUNTER — Ambulatory Visit
Admission: RE | Admit: 2018-05-14 | Discharge: 2018-05-14 | Disposition: A | Discharge status: Home / Self Care | Payer: BLUE CROSS/BLUE SHIELD | Source: Ambulatory Visit | Attending: Radiation Oncology | Admitting: Radiation Oncology

## 2018-05-14 ENCOUNTER — Encounter: Payer: Self-pay | Admitting: Radiation Oncology

## 2018-05-14 ENCOUNTER — Other Ambulatory Visit: Payer: Self-pay

## 2018-05-14 VITALS — BP 129/86 | HR 90 | Temp 98.4°F | Ht 71.0 in | Wt 183.6 lb

## 2018-05-14 DIAGNOSIS — C50411 Malignant neoplasm of upper-outer quadrant of right female breast: Secondary | ICD-10-CM | POA: Insufficient documentation

## 2018-05-14 DIAGNOSIS — Z9221 Personal history of antineoplastic chemotherapy: Secondary | ICD-10-CM | POA: Diagnosis not present

## 2018-05-14 DIAGNOSIS — Z170 Estrogen receptor positive status [ER+]: Secondary | ICD-10-CM

## 2018-05-14 DIAGNOSIS — Z9889 Other specified postprocedural states: Secondary | ICD-10-CM | POA: Diagnosis not present

## 2018-05-14 DIAGNOSIS — Z79899 Other long term (current) drug therapy: Secondary | ICD-10-CM | POA: Insufficient documentation

## 2018-05-14 NOTE — Progress Notes (Signed)
Radiation Oncology         [phone] ________________________________  Name: Sandra [name]        MRN: [id number]  Date of Service: 05/14/2018 DOB: [birth year]  TF:[id number], Danae Chen, DO  Nicholas Lose, MD     REFERRING PHYSICIAN: Nicholas Lose, MD   DIAGNOSIS: The encounter diagnosis was Malignant neoplasm of upper-outer quadrant of right breast in female, estrogen receptor positive ([place]).   HISTORY OF PRESENT ILLNESS: Sandra [name] is a 53 y.o. female seen in the multidisciplinary breast clinic for a new diagnosis of right breast cancer. The patient was noted to have a palpable mass in the right breast which prompted diagnostic imaging. This revealed a 1.7 x 1.6 x 1.6 cm mass at 10:00. Her axilla was negative for adenopathy. A biopsy on 11/11/17 which revealed a grade 3 invasive ductal carcinoma, triple positive with a Ki 67 of 70%. She subsequently underwent MRI that was negative for adenopathy or new disease. She proceeded with neoadjuvant chemotherapy between 12/05/18 and 03/28/18. She went on to have post treatment MRI that showed complete radiographic response. She underwent lumpectomy with sentinel node biopsy on 04/23/2018 and this revealed a 15 mm residual ductal carcinoma, grade 2, and her 3 sampled nodes were negative. She had negative margins as well and comes today to consider adjuvant radiotherapy. She will continue Herceptin/Perjeta until September 2020.   PREVIOUS RADIATION THERAPY: No   PAST MEDICAL HISTORY:  Past Medical History:  Diagnosis Date  . Breast cancer ([place]) 11/2017   rt.Br.  . Depression   . Endometriosis, diagnosis via laparoscopy   . Family history of breast cancer   . Family history of breast cancer   . Family history of colon cancer   . Family history of prostate cancer   . Hyperlipidemia 2017  . Lichen sclerosus, vulva   . Low serum vitamin D 2017       PAST SURGICAL HISTORY: Past Surgical History:  Procedure Laterality Date  . APPENDECTOMY   2005  . BREAST LUMPECTOMY WITH RADIOACTIVE SEED AND SENTINEL LYMPH NODE BIOPSY Right 04/23/2018   Procedure: RIGHT BREAST WITH RADIOACTIVE SEED LUMPECTOMY x3 AND SENTINEL LYMPH NODE MAPPING;  Surgeon: Erroll Luna, MD;  Location: [facility];  Service: General;  Laterality: Right;  . CESAREAN SECTION  2002  . MANDIBLE RECONSTRUCTION  1985   wires inside jaw according to pt  . PELVIC LAPAROSCOPY  2004  . PORTACATH PLACEMENT Right 12/03/2017   Procedure: INSERTION PORT-A-CATH;  Surgeon: Erroll Luna, MD;  Location: [facility];  Service: General;  Laterality: Right;  . SHIN SURGERY  1998     FAMILY HISTORY:  Family History  Problem Relation Age of Onset  . Stroke Mother   . Heart disease Mother   . Colon cancer Father   . Prostate cancer Father   . Hypertension Brother   . Cancer Maternal Uncle        NOS  . Breast cancer Paternal Aunt        dx in her 78s  . Prostate cancer Paternal Uncle   . Alcohol abuse Maternal Grandfather   . Breast cancer Paternal Grandmother        dx in her 69s  . Prostate cancer Paternal Grandfather   . SIDS Brother   . Cancer Paternal Aunt   . Tuberculosis Paternal Uncle      SOCIAL HISTORY:  reports that she has never smoked. She has never used smokeless tobacco. She reports that she  does not drink alcohol or use drugs. The patient is married and lives in [place]. She is a homemaker, and has young teenage daughters.    ALLERGIES: Patient has no known allergies.   MEDICATIONS:  Current Outpatient Medications  Medication Sig Dispense Refill  . betamethasone valerate ointment (VALISONE) 0.1 % Apply bid for 1-2 weeks as needed for flare of lichen sclerosis. 15 g 1  . Biotin 10 MG CAPS Take by mouth.    Marland Kitchen ibuprofen (ADVIL,MOTRIN) 800 MG tablet Take 1 tablet (800 mg total) by mouth every 8 (eight) hours as needed. 30 tablet 0  . lidocaine-prilocaine (EMLA) cream Apply 1 application topically daily as needed (port use).   5  .  LORazepam (ATIVAN) 1 MG tablet Take 1 tablet (1 mg total) by mouth every 8 (eight) hours as needed for anxiety (or nausea). 30 tablet 0  . pantoprazole (PROTONIX) 40 MG tablet Take 1 tablet (40 mg total) by mouth daily. 30 tablet 1  . venlafaxine XR (EFFEXOR-XR) 75 MG 24 hr capsule Take 1 capsule (75 mg total) by mouth 2 (two) times daily. 180 capsule 0  . ondansetron (ZOFRAN) 8 MG tablet TAKE 1 TABLET (8 MG TOTAL) BY MOUTH 2 (TWO) TIMES DAILY AS NEEDED (NAUSEA OR VOMITING). (Patient not taking: Reported on 04/07/2018) 30 tablet 1  . oxyCODONE (OXY IR/ROXICODONE) 5 MG immediate release tablet Take 1 tablet (5 mg total) by mouth every 6 (six) hours as needed for severe pain. (Patient not taking: Reported on 05/14/2018) 15 tablet 0  . potassium chloride (MICRO-K) 10 MEQ CR capsule Take 2 capsules (20 mEq total) by mouth daily. (Patient not taking: Reported on 04/07/2018) 60 capsule 3  . prochlorperazine (COMPAZINE) 10 MG tablet Take 1 tablet (10 mg total) by mouth every 6 (six) hours as needed (Nausea or vomiting). (Patient not taking: Reported on 05/14/2018) 30 tablet 1   No current facility-administered medications for this encounter.      REVIEW OF SYSTEMS: On review of systems, the patient reports that she is doing well overall. She denies any chest pain, shortness of breath, cough, fevers, chills, night sweats, unintended weight changes. She denies any bowel or bladder disturbances, and denies abdominal pain, nausea or vomiting. She denies any new musculoskeletal or joint aches or pains. A complete review of systems is obtained and is otherwise negative.     PHYSICAL EXAM:  Wt Readings from Last 3 Encounters:  05/14/18 183 lb 9.6 oz (83.3 kg)  04/30/18 189 lb 9.6 oz (86 kg)  04/23/18 182 lb (82.6 kg)   Temp Readings from Last 3 Encounters:  05/14/18 98.4 F (36.9 C) (Oral)  05/07/18 98.5 F (36.9 C) (Oral)  04/30/18 98.6 F (37 C) (Oral)   BP Readings from Last 3 Encounters:  05/14/18  129/86  05/07/18 109/82  04/30/18 137/75   Pulse Readings from Last 3 Encounters:  05/14/18 90  05/07/18 96  04/30/18 99     In general this is a well appearing Caucasian female in no acute distress. She is alert and oriented x4 and appropriate throughout the examination. HEENT reveals that the patient is normocephalic, atraumatic. EOMs are intact. Cardiopulmonary assessment is negative for acute distress and she exhibits normal effort. Breast exam on the right reveals well healed surgical site without fluctuance. Mild induration is noted along the lateral aspect of the breast deep to her surgical site. No erythema is noted.   ECOG = 1  0 - Asymptomatic (Fully active, able to carry on  all predisease activities without restriction)  1 - Symptomatic but completely ambulatory (Restricted in physically strenuous activity but ambulatory and able to carry out work of a light or sedentary nature. For example, light housework, office work)  2 - Symptomatic, <50% in bed during the day (Ambulatory and capable of all self care but unable to carry out any work activities. Up and about more than 50% of waking hours)  3 - Symptomatic, >50% in bed, but not bedbound (Capable of only limited self-care, confined to bed or chair 50% or more of waking hours)  4 - Bedbound (Completely disabled. Cannot carry on any self-care. Totally confined to bed or chair)  5 - Death   Eustace Pen MM, Creech RH, Tormey DC, et al. [phone]). "Toxicity and response criteria of the [facility] Group". [place] Oncol. 5 (6): 649-55    LABORATORY DATA:  Lab Results  Component Value Date   WBC 3.9 (L) 05/07/2018   HGB 10.8 (L) 05/07/2018   HCT 32.9 (L) 05/07/2018   MCV 100.0 05/07/2018   PLT 226 05/07/2018   Lab Results  Component Value Date   NA 140 05/07/2018   K 3.1 (L) 05/07/2018   CL 104 05/07/2018   CO2 25 05/07/2018   Lab Results  Component Value Date   ALT 50 (H) 05/07/2018   AST 41  05/07/2018   ALKPHOS 62 05/07/2018   BILITOT 0.3 05/07/2018      RADIOGRAPHY: Nm Sentinel Node Inj-no Rpt (breast)  Result Date: 04/23/2018 Sulfur colloid was injected by the nuclear medicine technologist for melanoma sentinel node.   Mm Breast Surgical Specimen  Result Date: 04/23/2018 CLINICAL DATA:  Status post breast conservation surgery today after earlier radioactive seed localizations (3 sites). EXAM: SPECIMEN RADIOGRAPH OF THE RIGHT BREAST COMPARISON:  Previous exam(s). FINDINGS: Status post excision of the right breast. The 3 radioactive seeds and biopsy marker clips are present within the single specimen, completely intact, and marked for pathology. The locations of the 3 radioactive seeds and biopsy marker clips within the single specimen were discussed with the OR staff during the procedure. IMPRESSION: Specimen radiograph of the right breast. Electronically Signed   By: Franki Cabot M.D.   On: 04/23/2018 14:43   Mm Rt Radioactive Seed Loc Mammo Guide  Result Date: 04/21/2018 CLINICAL DATA:  Pre lumpectomy localization of 3 sites of biopsy proven malignancy in the upper-outer right breast. EXAM: MAMMOGRAPHIC GUIDED RADIOACTIVE SEED LOCALIZATION OF THE RIGHT BREAST X 3 COMPARISON:  Previous exam(s). FINDINGS: Patient presents for radioactive seed localization prior to right lumpectomy. I met with the patient and we discussed the procedure of seed localization including benefits and alternatives. We discussed the high likelihood of a successful procedure. We discussed the risks of the procedure including infection, bleeding, tissue injury and further surgery. We discussed the low dose of radioactivity involved in the procedure. Informed, written consent was given. The usual time-out protocol was performed immediately prior to the procedure. SITE #1: RIBBON SHAPED CLIP IN THE POSTERIOR ASPECT OF THE UPPER-OUTER RIGHT BREAST Using mammographic guidance, sterile technique, 1% lidocaine and an  I-125 radioactive seed, the ribbon shaped clip in the posterior aspect of the upper-outer right breast was localized using a lateral approach. The follow-up mammogram images confirm the seed in the expected location and were marked for Dr. Brantley Stage. Follow-up survey of the patient confirms presence of the radioactive seed. Order number of I-125 seed:  [id number]. Total activity:  0.254 mCi reference Date: 04/10/2018 SITE #  2: CYLINDER-SHAPED CLIP IN THE MIDPORTION OF THE UPPER-OUTER RIGHT BREAST Using mammographic guidance, sterile technique, 1% lidocaine and an I-125 radioactive seed, the cylinder-shaped clip in the midportion of the upper-outer right breast was localized using a lateral approach. The follow-up mammogram images confirm the seed in the expected location and were marked for Dr. Brantley Stage. Follow-up survey of the patient confirms presence of the radioactive seed. Order number of I-125 seed:  [id number]. Total activity:  0.254 mCi reference Date: 04/10/2018 SITE #3: DUMBBELL-SHAPED CLIP IN THE ANTERIOR ASPECT OF THE UPPER-OUTER RIGHT BREAST Using mammographic guidance, sterile technique, 1% lidocaine and an I-125 radioactive seed, the dumbbell shaped clip in the anterior aspect of the upper-outer right breast was localized using a lateral approach. The follow-up mammogram images confirm the seed in the expected location and were marked for Dr. Brantley Stage. Follow-up survey of the patient confirms presence of the radioactive seed. Order number of I-125 seed:  [id number]. Total activity:  0.254 mCi reference Date: 04/10/2018 The patient developed an approximately 5 cm hematoma in the outer right breast from placement of the anterior needle. This was treated with compression, including compression with an Ace bandage wrap and flat ice pack placed prior to the patient leaving the Breast Center. She tolerated the procedure well and was released from the Breast Center. She was given instructions regarding seed removal.  IMPRESSION: Radioactive seed localization right breast x 3. No apparent complications. Electronically Signed   By: Claudie Revering M.D.   On: 04/21/2018 14:43   Mm Rt Radio Seed Ea Add Lesion Loc Mammo  Result Date: 04/21/2018 CLINICAL DATA:  Pre lumpectomy localization of 3 sites of biopsy proven malignancy in the upper-outer right breast. EXAM: MAMMOGRAPHIC GUIDED RADIOACTIVE SEED LOCALIZATION OF THE RIGHT BREAST X 3 COMPARISON:  Previous exam(s). FINDINGS: Patient presents for radioactive seed localization prior to right lumpectomy. I met with the patient and we discussed the procedure of seed localization including benefits and alternatives. We discussed the high likelihood of a successful procedure. We discussed the risks of the procedure including infection, bleeding, tissue injury and further surgery. We discussed the low dose of radioactivity involved in the procedure. Informed, written consent was given. The usual time-out protocol was performed immediately prior to the procedure. SITE #1: RIBBON SHAPED CLIP IN THE POSTERIOR ASPECT OF THE UPPER-OUTER RIGHT BREAST Using mammographic guidance, sterile technique, 1% lidocaine and an I-125 radioactive seed, the ribbon shaped clip in the posterior aspect of the upper-outer right breast was localized using a lateral approach. The follow-up mammogram images confirm the seed in the expected location and were marked for Dr. Brantley Stage. Follow-up survey of the patient confirms presence of the radioactive seed. Order number of I-125 seed:  [id number]. Total activity:  0.254 mCi reference Date: 04/10/2018 SITE #2: CYLINDER-SHAPED CLIP IN THE MIDPORTION OF THE UPPER-OUTER RIGHT BREAST Using mammographic guidance, sterile technique, 1% lidocaine and an I-125 radioactive seed, the cylinder-shaped clip in the midportion of the upper-outer right breast was localized using a lateral approach. The follow-up mammogram images confirm the seed in the expected location and were  marked for Dr. Brantley Stage. Follow-up survey of the patient confirms presence of the radioactive seed. Order number of I-125 seed:  [id number]. Total activity:  0.254 mCi reference Date: 04/10/2018 SITE #3: DUMBBELL-SHAPED CLIP IN THE ANTERIOR ASPECT OF THE UPPER-OUTER RIGHT BREAST Using mammographic guidance, sterile technique, 1% lidocaine and an I-125 radioactive seed, the dumbbell shaped clip in the anterior aspect of the upper-outer right breast was localized  using a lateral approach. The follow-up mammogram images confirm the seed in the expected location and were marked for Dr. Brantley Stage. Follow-up survey of the patient confirms presence of the radioactive seed. Order number of I-125 seed:  [id number]. Total activity:  0.254 mCi reference Date: 04/10/2018 The patient developed an approximately 5 cm hematoma in the outer right breast from placement of the anterior needle. This was treated with compression, including compression with an Ace bandage wrap and flat ice pack placed prior to the patient leaving the Breast Center. She tolerated the procedure well and was released from the Breast Center. She was given instructions regarding seed removal. IMPRESSION: Radioactive seed localization right breast x 3. No apparent complications. Electronically Signed   By: Claudie Revering M.D.   On: 04/21/2018 14:43   Mm Rt Radio Seed Ea Add Lesion Loc Mammo  Result Date: 04/21/2018 CLINICAL DATA:  Pre lumpectomy localization of 3 sites of biopsy proven malignancy in the upper-outer right breast. EXAM: MAMMOGRAPHIC GUIDED RADIOACTIVE SEED LOCALIZATION OF THE RIGHT BREAST X 3 COMPARISON:  Previous exam(s). FINDINGS: Patient presents for radioactive seed localization prior to right lumpectomy. I met with the patient and we discussed the procedure of seed localization including benefits and alternatives. We discussed the high likelihood of a successful procedure. We discussed the risks of the procedure including infection, bleeding,  tissue injury and further surgery. We discussed the low dose of radioactivity involved in the procedure. Informed, written consent was given. The usual time-out protocol was performed immediately prior to the procedure. SITE #1: RIBBON SHAPED CLIP IN THE POSTERIOR ASPECT OF THE UPPER-OUTER RIGHT BREAST Using mammographic guidance, sterile technique, 1% lidocaine and an I-125 radioactive seed, the ribbon shaped clip in the posterior aspect of the upper-outer right breast was localized using a lateral approach. The follow-up mammogram images confirm the seed in the expected location and were marked for Dr. Brantley Stage. Follow-up survey of the patient confirms presence of the radioactive seed. Order number of I-125 seed:  [id number]. Total activity:  0.254 mCi reference Date: 04/10/2018 SITE #2: CYLINDER-SHAPED CLIP IN THE MIDPORTION OF THE UPPER-OUTER RIGHT BREAST Using mammographic guidance, sterile technique, 1% lidocaine and an I-125 radioactive seed, the cylinder-shaped clip in the midportion of the upper-outer right breast was localized using a lateral approach. The follow-up mammogram images confirm the seed in the expected location and were marked for Dr. Brantley Stage. Follow-up survey of the patient confirms presence of the radioactive seed. Order number of I-125 seed:  [id number]. Total activity:  0.254 mCi reference Date: 04/10/2018 SITE #3: DUMBBELL-SHAPED CLIP IN THE ANTERIOR ASPECT OF THE UPPER-OUTER RIGHT BREAST Using mammographic guidance, sterile technique, 1% lidocaine and an I-125 radioactive seed, the dumbbell shaped clip in the anterior aspect of the upper-outer right breast was localized using a lateral approach. The follow-up mammogram images confirm the seed in the expected location and were marked for Dr. Brantley Stage. Follow-up survey of the patient confirms presence of the radioactive seed. Order number of I-125 seed:  [id number]. Total activity:  0.254 mCi reference Date: 04/10/2018 The patient developed an  approximately 5 cm hematoma in the outer right breast from placement of the anterior needle. This was treated with compression, including compression with an Ace bandage wrap and flat ice pack placed prior to the patient leaving the Breast Center. She tolerated the procedure well and was released from the Breast Center. She was given instructions regarding seed removal. IMPRESSION: Radioactive seed localization right breast x 3. No apparent complications. Electronically Signed  By: Claudie Revering M.D.   On: 04/21/2018 14:43       IMPRESSION/PLAN: 1. Stage IA, cT1cN0M0 grade 3 triple positive, invasive ductal carcinoma of the right breast. Dr. Lisbeth Renshaw discusses the final pathology findings and reviews the nature of triple positive breast disease. She has done well since completing chemotherapy and surgery. She would benefit from radiotherapy in the adjuvant setting to the breast. We discussed the risks, benefits, short, and long term effects of radiotherapy, and the patient is interested in proceeding. Dr. Lisbeth Renshaw discusses the delivery and logistics of radiotherapy and he recommends a course of 6 1/2 weeks of radiotherapy. Written consent is obtained and placed in the chart, a copy was provided to the patient. She will be contacted to coordinate simulation in the near future. 2. Contraceptive counseling. The patient is not having menses and is counseled on the rationale to avoid pregnancy during treatment if cycles returned. She is in agreement.   In a visit lasting 25 minutes, greater than 50% of the time was spent face to face discussing her case, and coordinating the patient's care.   The above documentation reflects my direct findings during this shared patient visit. Please see the separate note by Dr. Lisbeth Renshaw on this date for the remainder of the patient's plan of care.    Carola Rhine, PAC

## 2018-05-15 ENCOUNTER — Encounter: Payer: Self-pay | Admitting: General Practice

## 2018-05-15 NOTE — Progress Notes (Signed)
[facility] Psychosocial Distress Screening Clinical Social Work  Clinical Social Work was referred by distress screening protocol.  The patient scored a 5 on the Psychosocial Distress Thermometer which indicates moderate distress. Clinical Social Worker contacted patient by phone to assess for distress and other psychosocial needs. Unable to reach patient on mobile number provided, left VM w information [place], contact information and encouragement to call as needed for support/resources.   ONCBCN DISTRESS SCREENING 05/14/2018  Screening Type Initial Screening  Distress experienced in past week (1-10) 5  Practical problem type Work/school  Family Problem type   Emotional problem type Depression;Adjusting to illness  Spiritual/Religous concerns type   Information Concerns Type   Physical Problem type Sleep/insomnia    Clinical Social Worker follow up needed: No.  If yes, follow up plan:  Beverely Pace, [place], LCSW Clinical Social Worker Phone:  [phone]

## 2018-05-21 ENCOUNTER — Encounter (HOSPITAL_COMMUNITY): Payer: Self-pay | Admitting: Psychiatry

## 2018-05-21 ENCOUNTER — Ambulatory Visit
Admission: RE | Admit: 2018-05-21 | Discharge: 2018-05-21 | Disposition: A | Discharge status: Home / Self Care | Payer: BLUE CROSS/BLUE SHIELD | Source: Ambulatory Visit | Attending: Radiation Oncology | Admitting: Radiation Oncology

## 2018-05-21 ENCOUNTER — Ambulatory Visit (INDEPENDENT_AMBULATORY_CARE_PROVIDER_SITE_OTHER): Payer: BLUE CROSS/BLUE SHIELD | Admitting: Psychiatry

## 2018-05-21 ENCOUNTER — Other Ambulatory Visit: Payer: Self-pay | Admitting: Hematology and Oncology

## 2018-05-21 ENCOUNTER — Telehealth (HOSPITAL_COMMUNITY): Payer: Self-pay

## 2018-05-21 VITALS — BP 120/78 | Ht 71.0 in | Wt 184.0 lb

## 2018-05-21 DIAGNOSIS — Z170 Estrogen receptor positive status [ER+]: Secondary | ICD-10-CM | POA: Insufficient documentation

## 2018-05-21 DIAGNOSIS — F330 Major depressive disorder, recurrent, mild: Secondary | ICD-10-CM

## 2018-05-21 DIAGNOSIS — F411 Generalized anxiety disorder: Secondary | ICD-10-CM

## 2018-05-21 DIAGNOSIS — Z510 Encounter for antineoplastic radiation therapy: Secondary | ICD-10-CM | POA: Diagnosis not present

## 2018-05-21 DIAGNOSIS — C50411 Malignant neoplasm of upper-outer quadrant of right female breast: Secondary | ICD-10-CM | POA: Insufficient documentation

## 2018-05-21 MED ORDER — VENLAFAXINE HCL ER 150 MG PO CP24: 150.0000 mg | ORAL_CAPSULE | Freq: Every day | ORAL | 0 refills | Status: DC

## 2018-05-21 MED ORDER — TRAZODONE HCL 50 MG PO TABS: ORAL_TABLET | ORAL | 1 refills | Status: DC

## 2018-05-21 NOTE — Telephone Encounter (Signed)
I called the [facility] and spoke with medical records. They are going to fax the [facility] right away.

## 2018-05-21 NOTE — Telephone Encounter (Signed)
Thanks

## 2018-05-21 NOTE — Progress Notes (Signed)
  Radiation Oncology         [phone] ________________________________  Name: Sandra [name] MRN: [id number]  Date: 05/21/2018  DOB: 05/30/1965  DIAGNOSIS:     ICD-10-CM   1. Malignant neoplasm of upper-outer quadrant of right breast in female, estrogen receptor positive ([place]) C50.411    Z17.0      SIMULATION AND TREATMENT PLANNING NOTE  The patient presented for simulation prior to beginning her course of radiation treatment for her diagnosis of right-sided breast cancer. The patient was placed in a supine position on a breast board. A customized vac-lock bag was constructed and this complex treatment device will be used on a daily basis during her treatment. In this fashion, a CT scan was obtained through the chest area and an isocenter was placed near the chest wall within the breast.  The patient will be planned to receive a course of radiation initially to a dose of 50.4 Gy. This will consist of a whole breast radiotherapy technique. To accomplish this, 2 customized blocks have been designed which will correspond to medial and lateral whole breast tangent fields. This treatment will be accomplished at 1.8 Gy per fraction. A forward planning technique will also be evaluated to determine if this approach improves the plan. It is anticipated that the patient will then receive a 10 Gy boost to the seroma cavity which has been contoured. This will be accomplished at 2 Gy per fraction.   This initial treatment will consist of a 3-D conformal technique. The seroma has been contoured as the primary target structure. Additionally, dose volume histograms of both this target as well as the lungs and heart will also be evaluated. Such an approach is necessary to ensure that the target area is adequately covered while the nearby critical  normal structures are adequately spared.  Plan:  The final anticipated total dose therefore will correspond to 60.4  Gy.    _______________________________   Jodelle Gross, MD, PhD

## 2018-05-21 NOTE — Progress Notes (Signed)
[place] MD/PA/NP OP Progress Note  05/21/2018 9:42 AM Sandra [name]  MRN:  [id number]  Chief Complaint: I am doing better with increase Effexor.  HPI: Sandra [name] came for her appointment.  She is a 53 year old Caucasian married female who was seen first time on April 07, 2018.  [facility]  She was taking Effexor 75 mg and lorazepam 1 mg for anxiety.  She diagnosed with breast cancer in April 2019.  We have recommended to try increase Effexor which she had done in the past with better outcome.  Now she is taking Effexor and 50 mg daily.  She noticed improvement in her energy level and she is more motivated.  However she continues to have stressor.  Her 4 year old daughter feels that she is not connected at school.  Her daughter is seeing Lutricia Horsfall for therapy.  Patient also see therapist Joanna Puff but admitted lately has been missing appointment.  She described poor sleep and usually wake up from 1 AM to 3 PM but able to fall sleep.  She still have some emotional crying but denies any suicidal thoughts or any feeling of hopelessness.  We receive records from previous psychiatrist.  She had tried Wellbutrin which made her more depressed and dark.  Lamictal was ineffective.  She also recall [facility] but we did not receive the results.  She is tolerating Effexor very well and denies any tremors or shakes.  She is seeing oncology on a regular basis.  She lives with her 65 and 63 year old daughter and her husband who is she has been married for 20 years.  Her husband is supportive.  Patient denies any mania, psychosis or any hallucination.  She admitted drinking Coke which may be contributing to her insomnia.  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  Patient denies drinking or using any illegal substances.  Her appetite is okay.  Her vital signs are stable.  Visit Diagnosis:    ICD-10-CM   1. MDD (major  depressive disorder), recurrent episode, mild (HCC) F33.0 traZODone (DESYREL) 50 MG tablet  2. GAD (generalized anxiety disorder) F41.1 venlafaxine XR (EFFEXOR-XR) 150 MG 24 hr capsule    traZODone (DESYREL) 50 MG tablet    Past Psychiatric History: Reviewed [facility].  Tried Paxil, Lexapro but don't remember.  Lamictal did not help and Wellbutrin caused depression.  No h/o psychosis, suicidal attempt or  Inpatient.   Past Medical History:  Past Medical History:  Diagnosis Date  . Breast cancer ([place]) 11/2017   rt.Br.  . Depression   . Endometriosis, diagnosis via laparoscopy   . Family history of breast cancer   . Family history of breast cancer   . Family history of colon cancer   . Family history of prostate cancer   . Hyperlipidemia 2017  . Lichen sclerosus, vulva   . Low serum vitamin D 2017    Past Surgical History:  Procedure Laterality Date  . APPENDECTOMY  2005  . BREAST LUMPECTOMY WITH RADIOACTIVE SEED AND SENTINEL LYMPH NODE BIOPSY Right 04/23/2018   Procedure: RIGHT BREAST WITH RADIOACTIVE SEED LUMPECTOMY x3 AND SENTINEL LYMPH NODE MAPPING;  Surgeon: Erroll Luna, MD;  Location: [facility];  Service: General;  Laterality: Right;  . CESAREAN SECTION  2002  . MANDIBLE RECONSTRUCTION  1985   wires inside jaw according to pt  . PELVIC LAPAROSCOPY  2004  . PORTACATH  PLACEMENT Right 12/03/2017   Procedure: INSERTION PORT-A-CATH;  Surgeon: Erroll Luna, MD;  Location: [facility];  Service: General;  Laterality: Right;  . [place]    Family Psychiatric History: Reviewed.  Family History:  Family History  Problem Relation Age of Onset  . Stroke Mother   . Heart disease Mother   . Colon cancer Father   . Prostate cancer Father   . Hypertension Brother   . Cancer Maternal Uncle        NOS  . Breast cancer Paternal Aunt        dx in her 89s  . Prostate cancer Paternal Uncle   . Alcohol abuse Maternal Grandfather    . Breast cancer Paternal Grandmother        dx in her 10s  . Prostate cancer Paternal Grandfather   . SIDS Brother   . Cancer Paternal Aunt   . Tuberculosis Paternal Uncle     Social History:  Social History   Socioeconomic History  . Marital status: Married    Spouse name: Sandra [name]  . Number of children: 2  . Years of education: Not on file  . Highest education level: Bachelor's degree (e.g., BA, AB, BS)  Occupational History  . Not on file  Social Needs  . Financial resource strain: Patient refused  . Food insecurity:    Worry: Patient refused    Inability: Patient refused  . Transportation needs:    Medical: No    Non-medical: No  Tobacco Use  . Smoking status: Never Smoker  . Smokeless tobacco: Never Used  Substance and Sexual Activity  . Alcohol use: No    Alcohol/week: 0.0 standard drinks  . Drug use: No  . Sexual activity: Yes    Partners: Male    Comment: Mirena inserted 05/16/16, IUD is to be removed08/29/19  Lifestyle  . Physical activity:    Days per week: Patient refused    Minutes per session: Patient refused  . Stress: Patient refused  Relationships  . Social connections:    Talks on phone: Patient refused    Gets together: Not on file    Attends religious service: Not on file    Active member of club or organization: Not on file    Attends meetings of clubs or organizations: Not on file    Relationship status: Not on file  Other Topics Concern  . Not on file  Social History Narrative  . Not on file    Allergies: No Known Allergies  Metabolic Disorder Labs: Lab Results  Component Value Date   HGBA1C 5.4 04/21/2017   No results found for: PROLACTIN Lab Results  Component Value Date   CHOL 242 (H) 04/21/2017   TRIG 99.0 04/21/2017   HDL 58.60 04/21/2017   CHOLHDL 4 04/21/2017   VLDL 19.8 04/21/2017   LDLCALC 163 (H) 04/21/2017   LDLCALC 158 (H) 12/26/2016   Lab Results  Component Value Date   TSH 1.43 04/21/2017   TSH 1.113  11/11/2014    Therapeutic Level Labs: No results found for: LITHIUM No results found for: VALPROATE No components found for:  CBMZ  Current Medications: Current Outpatient Medications  Medication Sig Dispense Refill  . betamethasone valerate ointment (VALISONE) 0.1 % Apply bid for 1-2 weeks as needed for flare of lichen sclerosis. 15 g 1  . Biotin 10 MG CAPS Take by mouth.    Marland Kitchen ibuprofen (ADVIL,MOTRIN) 800 MG tablet Take 1 tablet (800 mg total) by  mouth every 8 (eight) hours as needed. 30 tablet 0  . lidocaine-prilocaine (EMLA) cream Apply 1 application topically daily as needed (port use).   5  . LORazepam (ATIVAN) 1 MG tablet Take 1 tablet (1 mg total) by mouth every 8 (eight) hours as needed for anxiety (or nausea). 30 tablet 0  . pantoprazole (PROTONIX) 40 MG tablet Take 1 tablet (40 mg total) by mouth daily. 30 tablet 1  . venlafaxine XR (EFFEXOR-XR) 150 MG 24 hr capsule Take 1 capsule (150 mg total) by mouth daily with breakfast. 90 capsule 0  . ondansetron (ZOFRAN) 8 MG tablet TAKE 1 TABLET (8 MG TOTAL) BY MOUTH 2 (TWO) TIMES DAILY AS NEEDED (NAUSEA OR VOMITING). (Patient not taking: Reported on 04/07/2018) 30 tablet 1  . potassium chloride (MICRO-K) 10 MEQ CR capsule Take 2 capsules (20 mEq total) by mouth daily. (Patient not taking: Reported on 04/07/2018) 60 capsule 3  . prochlorperazine (COMPAZINE) 10 MG tablet Take 1 tablet (10 mg total) by mouth every 6 (six) hours as needed (Nausea or vomiting). (Patient not taking: Reported on 05/14/2018) 30 tablet 1  . traZODone (DESYREL) 50 MG tablet Take 1/2 to one tab at bed time 30 tablet 1   No current facility-administered medications for this visit.      Musculoskeletal: Strength & Muscle Tone: within normal limits Gait & Station: normal Patient leans: N/A  Psychiatric Specialty Exam: ROS  Blood pressure 120/78, height 5\' 11"  (1.803 m), weight 184 lb (83.5 kg).Body mass index is 25.66 kg/m.  General Appearance: Casual  Eye  Contact:  Good  Speech:  Clear and Coherent and Normal Rate  Volume:  Normal  Mood:  Anxious  Affect:  Congruent  Thought Process:  Goal Directed  Orientation:  Full (Time, Place, and Person)  Thought Content: Logical   Suicidal Thoughts:  No  Homicidal Thoughts:  No  Memory:  Immediate;   Good Recent;   Good Remote;   Good  Judgement:  Good  Insight:  Good  Psychomotor Activity:  Normal  Concentration:  Concentration: Fair and Attention Span: Fair  Recall:  [place] of Knowledge: Good  Language: Good  Akathisia:  No  Handed:  Right  AIMS (if indicated): not done  Assets:  Communication Skills Desire for Improvement Housing Resilience Social Support  ADL's:  Intact  Cognition: WNL  Sleep:  Fair   Screenings: PHQ2-9     Office Visit from 04/21/2017 in [place]  PHQ-2 Total Score  0       Assessment and Plan: Major depressive disorder, recurrent.  Generalized anxiety disorder.  Discussed sleep hygiene and recommended to avoid caffeine and soda before going to bed.  Recommended to try low-dose trazodone to help sleep.  Start trazodone 50 mg half to 1 tablet as needed for insomnia.  Discussed side effect specially antihistamine, constipation and sedation.  Continue Effexor 150 mg in the morning.  She is using lorazepam as needed before MRI.  I also recommended to restart therapy with Joanna Puff.  [facility] however we do not have Genesight testing results.  Patient will contact them to have them results faxed to [place].  Recommended to call us back if she has any question or any concern.  Discussed safety concerns at any time having active suicidal thoughts or homicidal thought then she need to call 911 or go to local emergency room.  Follow-up in 2 months.   Kathlee Nations, MD  05/21/2018, 9:42 AM

## 2018-05-21 NOTE — Progress Notes (Signed)
  Radiation Oncology         [phone]) [phone] ________________________________  Name: AKEYA RYTHER MRN: [id number]  Date: 05/21/2018  DOB: [birth year]  Optical Surface Tracking Plan:  Since intensity modulated radiotherapy (IMRT) and 3D conformal radiation treatment methods are predicated on accurate and precise positioning for treatment, intrafraction motion monitoring is medically necessary to ensure accurate and safe treatment delivery.  The ability to quantify intrafraction motion without excessive ionizing radiation dose can only be performed with optical surface tracking. Accordingly, surface imaging offers the opportunity to obtain 3D measurements of patient position throughout IMRT and 3D treatments without excessive radiation exposure.  I am ordering optical surface tracking for this patient's upcoming course of radiotherapy. ________________________________  Kyung Rudd, MD 05/21/2018 4:10 PM    Reference:   Particia Jasper, et al. Surface imaging-based analysis of intrafraction motion for breast radiotherapy patients.Journal of [place], n. 6, nov. 2014. ISSN [id number].   Available at: <http://www.jacmp.org/index.php/jacmp/article/view/4957>.

## 2018-05-26 DIAGNOSIS — C50411 Malignant neoplasm of upper-outer quadrant of right female breast: Secondary | ICD-10-CM | POA: Diagnosis not present

## 2018-05-26 DIAGNOSIS — F4323 Adjustment disorder with mixed anxiety and depressed mood: Secondary | ICD-10-CM | POA: Diagnosis not present

## 2018-05-26 DIAGNOSIS — Z170 Estrogen receptor positive status [ER+]: Secondary | ICD-10-CM | POA: Diagnosis not present

## 2018-05-26 DIAGNOSIS — Z510 Encounter for antineoplastic radiation therapy: Secondary | ICD-10-CM | POA: Diagnosis not present

## 2018-05-27 NOTE — Progress Notes (Signed)
Patient Care Team: Briscoe Deutscher, DO as PCP - General (Family Medicine) Macario Carls, MD as Referring Physician (Specialist) Erroll Luna, MD as Consulting Physician (General Surgery) Nicholas Lose, MD as Consulting Physician (Hematology and Oncology) Kyung Rudd, MD as Consulting Physician (Radiation Oncology)  DIAGNOSIS:    ICD-10-CM   1. Malignant neoplasm of upper-outer quadrant of right breast in female, estrogen receptor positive ([place]) C50.411    Z17.0     SUMMARY OF ONCOLOGIC HISTORY:   Malignant neoplasm of upper-outer quadrant of right breast in female, estrogen receptor positive ([place])   11/11/2017 Initial Diagnosis    Palpable right breast mass at 10 o'clock position 1.7 cm, axilla negative, biopsy revealed grade 3 IDC ER 60%, PR 10%, Ki-67 70%, HER-2 positive, T1 CN 0 stage Ia    11/19/2017 Cancer Staging    Staging form: Breast, AJCC 8th Edition - Clinical: Stage IB (cT2, cN0, cM0, G3, ER+, PR+, HER2+) - Signed by Nicholas Lose, MD on 12/04/2017    11/25/2017 Breast MRI    4.4 x 2.8 x 2.1 cm right breast malignancy UOQ, second focus 0.8 cm (biopsy planned for 12/09/2017)    12/04/2017 - 03/26/2018 Neo-Adjuvant Chemotherapy    TCH Perjeta every 3 week x6 followed by Herceptin Perjeta maintenance    01/04/2018 Genetic Testing    Negative genetic testing on the multi-cancer panel.  The Multi-Gene Panel offered by [facility] includes sequencing and/or deletion duplication testing of the following 84 genes: AIP, ALK, APC, ATM, AXIN2,BAP1,  BARD1, BLM, BMPR1A, BRCA1, BRCA2, BRIP1, CASR, CDC73, CDH1, CDK4, CDKN1B, CDKN1C, CDKN2A (p14ARF), CDKN2A (p16INK4a), CEBPA, CHEK2, CTNNA1, DICER1, DIS3L2, EGFR (c.2369C>T, p.Thr790Met variant only), EPCAM (Deletion/duplication testing only), FH, FLCN, GATA2, GPC3, GREM1 (Promoter region deletion/duplication testing only), HOXB13 (c.251G>A, p.Gly84Glu), HRAS, KIT, MAX, MEN1, MET, MITF (c.952G>A, p.Glu318Lys variant only), MLH1, MSH2, MSH3, MSH6,  MUTYH, NBN, NF1, NF2, NTHL1, PALB2, PDGFRA, PHOX2B, PMS2, POLD1, POLE, POT1, PRKAR1A, PTCH1, PTEN, RAD50, RAD51C, RAD51D, RB1, RECQL4, RET, RUNX1, SDHAF2, SDHA (sequence changes only), SDHB, SDHC, SDHD, SMAD4, SMARCA4, SMARCB1, SMARCE1, STK11, SUFU, TERC, TERT, TMEM127, TP53, TSC1, TSC2, VHL, WRN and WT1.  The report date is January 04, 2018.    04/23/2018 Surgery    Right lumpectomy: Small residual invasive cancer multifocal 0.15 cm, grade 2, 0/3 lymph nodes all negative, ER 90%, PR 60%, HER-2 +3+ by IHC, Ki-67 20%, RCB class I, ympT1a, ypN0    05/07/2018 -  Chemotherapy    The patient had ado-trastuzumab emtansine (KADCYLA) 300 mg in sodium chloride 0.9 % 250 mL chemo infusion, 3.6 mg/kg = 300 mg, Intravenous, Once, 1 of 11 cycles Administration: 300 mg (05/07/2018)  for chemotherapy treatment.      CHIEF COMPLIANT: Follow-up of Kadcyla  INTERVAL HISTORY: Sandra [name] is a 53 y.o. with above-mentioned history of HER2 positive right breast cancer treated with neoadjuvant chemotherapy with TCHP followed by a right lumpectomy and is currently on Kadycla maintenance every 3 weeks. She presents to the clinic alone today and notes bone and joint pain and heartburn at night beginning in the last week. She reports constipation currently, and denies diarrhea accept for two days following her last treatment. She reports a decrease in appetite and has lost 10lbs in the last month. She is concerned about her potassium levels and whether she needs to resume taking a potassium supplement. She will begin radiation on Monday. She reviewed her medication list with me.   REVIEW OF SYSTEMS:   Constitutional: Denies fevers, chills (+) abnormal weight  loss, 10lbs in 4wks (+) loss of appetite Eyes: Denies blurriness of vision  Ears, nose, mouth, throat, and face: Denies mucositis or sore throat Respiratory: Denies cough, dyspnea or wheezes Cardiovascular: Denies palpitation, chest discomfort Gastrointestinal: Denies  nausea (+) heartburn (+) constipation MSK: (+) bone and joint pain Skin: Denies abnormal skin rashes Lymphatics: Denies new lymphadenopathy or easy bruising Neurological: Denies numbness, tingling or new weaknesses Behavioral/Psych: Mood is stable, no new changes  Extremities: No lower extremity edema Breast: denies any pain or lumps or nodules in either breasts All other systems were reviewed with the patient and are negative.  I have reviewed the past medical history, past surgical history, social history and family history with the patient and they are unchanged from previous note.  ALLERGIES:  has No Known Allergies.  MEDICATIONS:  Current Outpatient Medications  Medication Sig Dispense Refill  . betamethasone valerate ointment (VALISONE) 0.1 % Apply bid for 1-2 weeks as needed for flare of lichen sclerosis. 15 g 1  . Biotin 10 MG CAPS Take by mouth.    Marland Kitchen ibuprofen (ADVIL,MOTRIN) 800 MG tablet Take 1 tablet (800 mg total) by mouth every 8 (eight) hours as needed. 30 tablet 0  . lidocaine-prilocaine (EMLA) cream Apply 1 application topically daily as needed (port use).   5  . LORazepam (ATIVAN) 1 MG tablet Take 1 tablet (1 mg total) by mouth every 8 (eight) hours as needed for anxiety (or nausea). 30 tablet 3  . ondansetron (ZOFRAN) 8 MG tablet TAKE 1 TABLET (8 MG TOTAL) BY MOUTH 2 (TWO) TIMES DAILY AS NEEDED (NAUSEA OR VOMITING). (Patient not taking: Reported on 04/07/2018) 30 tablet 1  . pantoprazole (PROTONIX) 40 MG tablet TAKE 1 TABLET BY MOUTH EVERY DAY 30 tablet 1  . potassium chloride (MICRO-K) 10 MEQ CR capsule Take 2 capsules (20 mEq total) by mouth daily. (Patient not taking: Reported on 04/07/2018) 60 capsule 3  . prochlorperazine (COMPAZINE) 10 MG tablet Take 1 tablet (10 mg total) by mouth every 6 (six) hours as needed (Nausea or vomiting). (Patient not taking: Reported on 05/14/2018) 30 tablet 1  . traZODone (DESYREL) 50 MG tablet Take 1/2 to one tab at bed time 30 tablet 1  .  venlafaxine XR (EFFEXOR-XR) 150 MG 24 hr capsule Take 1 capsule (150 mg total) by mouth daily with breakfast. 90 capsule 0   No current facility-administered medications for this visit.    Facility-Administered Medications Ordered in Other Visits  Medication Dose Route Frequency Provider Last Rate Last Dose  . sodium chloride flush (NS) 0.9 % injection 10 mL  10 mL Intracatheter PRN Nicholas Lose, MD   10 mL at 05/28/18 0827    PHYSICAL EXAMINATION: ECOG PERFORMANCE STATUS: 1 - Symptomatic but completely ambulatory  Vitals:   05/28/18 0841  BP: 120/70  Pulse: 90  Resp: 18  Temp: 98.4 F (36.9 C)  SpO2: 100%   Filed Weights   05/28/18 0841  Weight: 179 lb 9.6 oz (81.5 kg)    GENERAL: alert, no distress and comfortable SKIN: skin color, texture, turgor are normal, no rashes or significant lesions EYES: normal, Conjunctiva are pink and non-injected, sclera clear OROPHARYNX: no exudate, no erythema and lips, buccal mucosa, and tongue normal  NECK: supple, thyroid normal size, non-tender, without nodularity LYMPH: no palpable lymphadenopathy in the cervical, axillary or inguinal LUNGS: clear to auscultation and percussion with normal breathing effort HEART: regular rate & rhythm and no murmurs and no lower extremity edema ABDOMEN: abdomen soft, non-tender  and normal bowel sounds MUSCULOSKELETAL: no cyanosis of digits and no clubbing  NEURO: alert & oriented x 3 with fluent speech, no focal motor/sensory deficits EXTREMITIES: No lower extremity edema  LABORATORY DATA:  I have reviewed the data as listed CMP Latest Ref Rng & Units 05/07/2018 04/17/2018 03/26/2018  Glucose 70 - 99 mg/dL 124(H) 90 133(H)  BUN 6 - 20 mg/dL '12 8 12  ' Creatinine 0.44 - 1.00 mg/dL 0.82 0.84 0.74  Sodium 135 - 145 mmol/L 140 138 139  Potassium 3.5 - 5.1 mmol/L 3.1(L) 3.6 3.5  Chloride 98 - 111 mmol/L 104 103 105  CO2 22 - 32 mmol/L '25 25 24  ' Calcium 8.9 - 10.3 mg/dL 9.3 8.9 8.8(L)  Total Protein 6.5 -  8.1 g/dL 6.8 6.1(L) 6.2(L)  Total Bilirubin 0.3 - 1.2 mg/dL 0.3 0.3 0.2(L)  Alkaline Phos 38 - 126 U/L 62 47 49  AST 15 - 41 U/L 41 28 19  ALT 0 - 44 U/L 50(H) 28 16    Lab Results  Component Value Date   WBC 2.6 (L) 05/28/2018   HGB 10.8 (L) 05/28/2018   HCT 32.4 (L) 05/28/2018   MCV 96.7 05/28/2018   PLT 157 05/28/2018   NEUTROABS 1.1 (L) 05/28/2018    ASSESSMENT & PLAN:  Malignant neoplasm of upper-outer quadrant of right breast in female, estrogen receptor positive ([place]) 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  Treatment plan: 1.Neoadjuvant chemotherapy with [place] Perjeta 12/04/2017-12/26/2019followed by Herceptin and Perjetamaintenance for 1 year 2.breast conserving surgery with sentinel lymph node biopsy: 04/23/2018 3.Radiation therapy: We will discuss in tumor board 4.Adjuvant antiestrogen therapy -------------------------------------------------------------------------------------------------------------------------------- 04/23/2018:Right lumpectomy: Small residual invasive cancer multifocal 0.15 cm, grade 2, 0/3 lymph nodes all negative, ER 90%, PR 60%, HER-2 +3+ by IHC, Ki-67 20%, RCB class I, ympT1a, ypN0  Treatment plan: Kadcyla based on [facility] clinical trial every 3 weeks to complete 1 year of therapy. Patient will undergo adjuvant radiation.  Significant weight loss: I discussed with her about maintaining her weight. I refilled her Ativan prescription today. Return to clinic every 3 weeks for Kadcyla    No orders of the defined types were placed in this  encounter.  The patient has a good understanding of the overall plan. she agrees with it. she will call with any problems that may develop before the next visit here.  Nicholas Lose, MD 05/28/2018  Julious Oka Dorshimer am acting as scribe for Dr. Nicholas Lose.  I have reviewed the above documentation for accuracy and completeness, and I agree with the above.

## 2018-05-28 ENCOUNTER — Inpatient Hospital Stay (HOSPITAL_BASED_OUTPATIENT_CLINIC_OR_DEPARTMENT_OTHER): Payer: BLUE CROSS/BLUE SHIELD | Admitting: Hematology and Oncology

## 2018-05-28 ENCOUNTER — Encounter: Payer: Self-pay | Admitting: *Deleted

## 2018-05-28 ENCOUNTER — Telehealth: Payer: Self-pay | Admitting: Hematology and Oncology

## 2018-05-28 ENCOUNTER — Inpatient Hospital Stay: Payer: BLUE CROSS/BLUE SHIELD

## 2018-05-28 DIAGNOSIS — M898X9 Other specified disorders of bone, unspecified site: Secondary | ICD-10-CM

## 2018-05-28 DIAGNOSIS — C50411 Malignant neoplasm of upper-outer quadrant of right female breast: Secondary | ICD-10-CM | POA: Diagnosis not present

## 2018-05-28 DIAGNOSIS — Z5112 Encounter for antineoplastic immunotherapy: Secondary | ICD-10-CM | POA: Diagnosis not present

## 2018-05-28 DIAGNOSIS — Z170 Estrogen receptor positive status [ER+]: Secondary | ICD-10-CM | POA: Diagnosis not present

## 2018-05-28 DIAGNOSIS — Z95828 Presence of other vascular implants and grafts: Secondary | ICD-10-CM

## 2018-05-28 DIAGNOSIS — M2550 Pain in unspecified joint: Secondary | ICD-10-CM

## 2018-05-28 DIAGNOSIS — R630 Anorexia: Secondary | ICD-10-CM

## 2018-05-28 DIAGNOSIS — R634 Abnormal weight loss: Secondary | ICD-10-CM

## 2018-05-28 LAB — CBC WITH DIFFERENTIAL (CANCER CENTER ONLY)
Abs Immature Granulocytes: 0 10*3/uL (ref 0.00–0.07)
Basophils Absolute: 0 10*3/uL (ref 0.0–0.1)
Basophils Relative: 0 %
Eosinophils Absolute: 0.1 10*3/uL (ref 0.0–0.5)
Eosinophils Relative: 4 %
HCT: 32.4 % — ABNORMAL LOW (ref 36.0–46.0)
Hemoglobin: 10.8 g/dL — ABNORMAL LOW (ref 12.0–15.0)
Immature Granulocytes: 0 %
Lymphocytes Relative: 39 %
Lymphs Abs: 1 10*3/uL (ref 0.7–4.0)
MCH: 32.2 pg (ref 26.0–34.0)
MCHC: 33.3 g/dL (ref 30.0–36.0)
MCV: 96.7 fL (ref 80.0–100.0)
Monocytes Absolute: 0.3 10*3/uL (ref 0.1–1.0)
Monocytes Relative: 13 %
Neutro Abs: 1.1 10*3/uL — ABNORMAL LOW (ref 1.7–7.7)
Neutrophils Relative %: 44 %
Platelet Count: 157 10*3/uL (ref 150–400)
RBC: 3.35 MIL/uL — ABNORMAL LOW (ref 3.87–5.11)
RDW: 12.5 % (ref 11.5–15.5)
WBC Count: 2.6 10*3/uL — ABNORMAL LOW (ref 4.0–10.5)
nRBC: 0 % (ref 0.0–0.2)

## 2018-05-28 LAB — CMP (CANCER CENTER ONLY)
ALT: 50 U/L — ABNORMAL HIGH (ref 0–44)
AST: 46 U/L — ABNORMAL HIGH (ref 15–41)
Albumin: 3.5 g/dL (ref 3.5–5.0)
Alkaline Phosphatase: 70 U/L (ref 38–126)
Anion gap: 10 (ref 5–15)
BUN: 9 mg/dL (ref 6–20)
CO2: 26 mmol/L (ref 22–32)
Calcium: 9.1 mg/dL (ref 8.9–10.3)
Chloride: 105 mmol/L (ref 98–111)
Creatinine: 0.82 mg/dL (ref 0.44–1.00)
GFR, Est AFR Am: >60 mL/min (ref >60)
GFR, Estimated: >60 mL/min (ref >60)
Glucose, Bld: 88 mg/dL (ref 70–99)
Potassium: 3.2 mmol/L — ABNORMAL LOW (ref 3.5–5.1)
Sodium: 141 mmol/L (ref 135–145)
Total Bilirubin: 0.3 mg/dL (ref 0.3–1.2)
Total Protein: 6.9 g/dL (ref 6.5–8.1)

## 2018-05-28 MED ORDER — DIPHENHYDRAMINE HCL 25 MG PO CAPS: 50.0000 mg | ORAL_CAPSULE | Freq: Once | ORAL | Status: DC

## 2018-05-28 MED ORDER — ACETAMINOPHEN 325 MG PO TABS: 650.0000 mg | ORAL_TABLET | Freq: Once | ORAL | Status: DC

## 2018-05-28 MED ORDER — SODIUM CHLORIDE 0.9% FLUSH
10.0000 mL | INTRAVENOUS | Status: DC | PRN
  Administered 2018-05-28: 10 mL
  Filled 2018-05-28: qty 10

## 2018-05-28 MED ORDER — LORAZEPAM 1 MG PO TABS: 1.0000 mg | ORAL_TABLET | Freq: Three times a day (TID) | ORAL | 3 refills | Status: DC | PRN

## 2018-05-28 MED ORDER — DIPHENHYDRAMINE HCL 25 MG PO CAPS
ORAL_CAPSULE | ORAL | Status: AC
  Filled 2018-05-28: qty 2

## 2018-05-28 MED ORDER — HEPARIN SOD (PORK) LOCK FLUSH 100 UNIT/ML IV SOLN
500.0000 [IU] | Freq: Once | INTRAVENOUS | Status: AC | PRN
  Administered 2018-05-28: 500 [IU]
  Filled 2018-05-28: qty 5

## 2018-05-28 MED ORDER — ACETAMINOPHEN 325 MG PO TABS
ORAL_TABLET | ORAL | Status: AC
  Filled 2018-05-28: qty 2

## 2018-05-28 MED ORDER — SODIUM CHLORIDE 0.9 % IV SOLN
Freq: Once | INTRAVENOUS | Status: AC
  Administered 2018-05-28: 10:00:00 via INTRAVENOUS
  Filled 2018-05-28: qty 250

## 2018-05-28 MED ORDER — SODIUM CHLORIDE 0.9 % IV SOLN
3.6000 mg/kg | Freq: Once | INTRAVENOUS | Status: AC
  Administered 2018-05-28: 300 mg via INTRAVENOUS
  Filled 2018-05-28: qty 15

## 2018-05-28 NOTE — Telephone Encounter (Signed)
No los °

## 2018-05-28 NOTE — Patient Instructions (Signed)
[facility] Discharge Instructions for Patients Receiving Chemotherapy  Today you received the following chemotherapy agents Ado-Trastuzumab (KADCYLA).  To help prevent nausea and vomiting after your treatment, we encourage you to take your nausea medication as prescribed.   If you develop nausea and vomiting that is not controlled by your nausea medication, call the clinic.   BELOW ARE SYMPTOMS THAT SHOULD BE REPORTED IMMEDIATELY:  *FEVER GREATER THAN 100.5 F  *CHILLS WITH OR WITHOUT FEVER  NAUSEA AND VOMITING THAT IS NOT CONTROLLED WITH YOUR NAUSEA MEDICATION  *UNUSUAL SHORTNESS OF BREATH  *UNUSUAL BRUISING OR BLEEDING  TENDERNESS IN MOUTH AND THROAT WITH OR WITHOUT PRESENCE OF ULCERS  *URINARY PROBLEMS  *BOWEL PROBLEMS  UNUSUAL RASH Items with * indicate a potential emergency and should be followed up as soon as possible.  Feel free to call the clinic should you have any questions or concerns. The clinic phone number is [phone].  Please show the CHEMO ALERT CARD at check-in to the Emergency Department and triage nurse.   

## 2018-05-28 NOTE — Assessment & Plan Note (Signed)
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  Treatment plan: 1.Neoadjuvant chemotherapy with [place] Perjeta 12/04/2017-12/26/2019followed by Herceptin and Perjetamaintenance for 1 year 2.breast conserving surgery with sentinel lymph node biopsy: 04/23/2018 3.Radiation therapy: We will discuss in tumor board 4.Adjuvant antiestrogen therapy -------------------------------------------------------------------------------------------------------------------------------- 04/23/2018:Right lumpectomy: Small residual invasive cancer multifocal 0.15 cm, grade 2, 0/3 lymph nodes all negative, ER 90%, PR 60%, HER-2 +3+ by IHC, Ki-67 20%, RCB class I, ympT1a, ypN0  Treatment plan: Kadcyla based on [facility] clinical trial every 3 weeks to complete 1 year of therapy. Patient will undergo adjuvant radiation.  Return to clinic every 3 weeks for Kadcyla

## 2018-05-28 NOTE — Progress Notes (Signed)
Per Dr. Lindi Adie, okay to treat patient today with Scr. Of 1.1

## 2018-06-01 ENCOUNTER — Ambulatory Visit
Admission: RE | Admit: 2018-06-01 | Discharge: 2018-06-01 | Disposition: A | Discharge status: Home / Self Care | Payer: BLUE CROSS/BLUE SHIELD | Source: Ambulatory Visit | Attending: Radiation Oncology | Admitting: Radiation Oncology

## 2018-06-01 DIAGNOSIS — Z170 Estrogen receptor positive status [ER+]: Secondary | ICD-10-CM | POA: Insufficient documentation

## 2018-06-01 DIAGNOSIS — Z510 Encounter for antineoplastic radiation therapy: Secondary | ICD-10-CM | POA: Diagnosis not present

## 2018-06-01 DIAGNOSIS — C50411 Malignant neoplasm of upper-outer quadrant of right female breast: Secondary | ICD-10-CM | POA: Insufficient documentation

## 2018-06-02 ENCOUNTER — Ambulatory Visit
Admission: RE | Admit: 2018-06-02 | Discharge: 2018-06-02 | Disposition: A | Discharge status: Home / Self Care | Payer: BLUE CROSS/BLUE SHIELD | Source: Ambulatory Visit | Attending: Radiation Oncology | Admitting: Radiation Oncology

## 2018-06-02 DIAGNOSIS — Z170 Estrogen receptor positive status [ER+]: Secondary | ICD-10-CM | POA: Diagnosis not present

## 2018-06-02 DIAGNOSIS — Z510 Encounter for antineoplastic radiation therapy: Secondary | ICD-10-CM | POA: Diagnosis not present

## 2018-06-02 DIAGNOSIS — C50411 Malignant neoplasm of upper-outer quadrant of right female breast: Secondary | ICD-10-CM | POA: Diagnosis not present

## 2018-06-03 ENCOUNTER — Ambulatory Visit
Admission: RE | Admit: 2018-06-03 | Discharge: 2018-06-03 | Disposition: A | Discharge status: Home / Self Care | Payer: BLUE CROSS/BLUE SHIELD | Source: Ambulatory Visit | Attending: Radiation Oncology | Admitting: Radiation Oncology

## 2018-06-03 DIAGNOSIS — Z170 Estrogen receptor positive status [ER+]: Secondary | ICD-10-CM | POA: Diagnosis not present

## 2018-06-03 DIAGNOSIS — Z510 Encounter for antineoplastic radiation therapy: Secondary | ICD-10-CM | POA: Diagnosis not present

## 2018-06-03 DIAGNOSIS — C50411 Malignant neoplasm of upper-outer quadrant of right female breast: Secondary | ICD-10-CM | POA: Diagnosis not present

## 2018-06-04 ENCOUNTER — Ambulatory Visit
Admission: RE | Admit: 2018-06-04 | Discharge: 2018-06-04 | Disposition: A | Discharge status: Home / Self Care | Payer: BLUE CROSS/BLUE SHIELD | Source: Ambulatory Visit | Attending: Radiation Oncology | Admitting: Radiation Oncology

## 2018-06-04 ENCOUNTER — Encounter: Payer: Self-pay | Admitting: Hematology and Oncology

## 2018-06-04 DIAGNOSIS — Z510 Encounter for antineoplastic radiation therapy: Secondary | ICD-10-CM | POA: Diagnosis not present

## 2018-06-04 DIAGNOSIS — Z170 Estrogen receptor positive status [ER+]: Secondary | ICD-10-CM | POA: Diagnosis not present

## 2018-06-04 DIAGNOSIS — C50411 Malignant neoplasm of upper-outer quadrant of right female breast: Secondary | ICD-10-CM | POA: Diagnosis not present

## 2018-06-04 NOTE — Progress Notes (Signed)
En[facility] in [facility] program. She wasapproved for $25,000 for Kadcyla for a year from 06/03/18. Pt's responsibility will be as little as $5 per infusion.

## 2018-06-05 ENCOUNTER — Ambulatory Visit
Admission: RE | Admit: 2018-06-05 | Discharge: 2018-06-05 | Disposition: A | Discharge status: Home / Self Care | Payer: BLUE CROSS/BLUE SHIELD | Source: Ambulatory Visit | Attending: Radiation Oncology | Admitting: Radiation Oncology

## 2018-06-05 DIAGNOSIS — C50411 Malignant neoplasm of upper-outer quadrant of right female breast: Secondary | ICD-10-CM

## 2018-06-05 DIAGNOSIS — Z170 Estrogen receptor positive status [ER+]: Secondary | ICD-10-CM | POA: Diagnosis not present

## 2018-06-05 DIAGNOSIS — Z510 Encounter for antineoplastic radiation therapy: Secondary | ICD-10-CM | POA: Diagnosis not present

## 2018-06-05 MED ORDER — RADIAPLEXRX EX GEL
Freq: Once | CUTANEOUS | Status: AC
  Administered 2018-06-05: 11:00:00 via TOPICAL

## 2018-06-05 MED ORDER — ALRA NON-METALLIC DEODORANT (RAD-ONC)
1.0000 "application " | Freq: Once | TOPICAL | Status: AC
  Administered 2018-06-05: 1 via TOPICAL

## 2018-06-05 NOTE — Progress Notes (Signed)

## 2018-06-08 ENCOUNTER — Ambulatory Visit
Admission: RE | Admit: 2018-06-08 | Discharge: 2018-06-08 | Disposition: A | Discharge status: Home / Self Care | Payer: BLUE CROSS/BLUE SHIELD | Source: Ambulatory Visit | Attending: Radiation Oncology | Admitting: Radiation Oncology

## 2018-06-08 DIAGNOSIS — Z170 Estrogen receptor positive status [ER+]: Secondary | ICD-10-CM | POA: Diagnosis not present

## 2018-06-08 DIAGNOSIS — C50411 Malignant neoplasm of upper-outer quadrant of right female breast: Secondary | ICD-10-CM | POA: Diagnosis not present

## 2018-06-08 DIAGNOSIS — Z510 Encounter for antineoplastic radiation therapy: Secondary | ICD-10-CM | POA: Diagnosis not present

## 2018-06-09 ENCOUNTER — Ambulatory Visit
Admission: RE | Admit: 2018-06-09 | Discharge: 2018-06-09 | Disposition: A | Discharge status: Home / Self Care | Payer: BLUE CROSS/BLUE SHIELD | Source: Ambulatory Visit | Attending: Radiation Oncology | Admitting: Radiation Oncology

## 2018-06-09 DIAGNOSIS — Z170 Estrogen receptor positive status [ER+]: Secondary | ICD-10-CM | POA: Diagnosis not present

## 2018-06-09 DIAGNOSIS — Z510 Encounter for antineoplastic radiation therapy: Secondary | ICD-10-CM | POA: Diagnosis not present

## 2018-06-09 DIAGNOSIS — C50411 Malignant neoplasm of upper-outer quadrant of right female breast: Secondary | ICD-10-CM | POA: Diagnosis not present

## 2018-06-09 DIAGNOSIS — F4323 Adjustment disorder with mixed anxiety and depressed mood: Secondary | ICD-10-CM | POA: Diagnosis not present

## 2018-06-10 ENCOUNTER — Ambulatory Visit
Admission: RE | Admit: 2018-06-10 | Discharge: 2018-06-10 | Disposition: A | Discharge status: Home / Self Care | Payer: BLUE CROSS/BLUE SHIELD | Source: Ambulatory Visit | Attending: Radiation Oncology | Admitting: Radiation Oncology

## 2018-06-10 DIAGNOSIS — C50411 Malignant neoplasm of upper-outer quadrant of right female breast: Secondary | ICD-10-CM | POA: Diagnosis not present

## 2018-06-10 DIAGNOSIS — Z510 Encounter for antineoplastic radiation therapy: Secondary | ICD-10-CM | POA: Diagnosis not present

## 2018-06-10 DIAGNOSIS — Z170 Estrogen receptor positive status [ER+]: Secondary | ICD-10-CM | POA: Diagnosis not present

## 2018-06-11 ENCOUNTER — Ambulatory Visit
Admission: RE | Admit: 2018-06-11 | Discharge: 2018-06-11 | Disposition: A | Discharge status: Home / Self Care | Payer: BLUE CROSS/BLUE SHIELD | Source: Ambulatory Visit | Attending: Radiation Oncology | Admitting: Radiation Oncology

## 2018-06-11 DIAGNOSIS — C50411 Malignant neoplasm of upper-outer quadrant of right female breast: Secondary | ICD-10-CM | POA: Diagnosis not present

## 2018-06-11 DIAGNOSIS — Z510 Encounter for antineoplastic radiation therapy: Secondary | ICD-10-CM | POA: Diagnosis not present

## 2018-06-11 DIAGNOSIS — Z170 Estrogen receptor positive status [ER+]: Secondary | ICD-10-CM | POA: Diagnosis not present

## 2018-06-12 ENCOUNTER — Other Ambulatory Visit: Payer: Self-pay

## 2018-06-12 ENCOUNTER — Ambulatory Visit
Admission: RE | Admit: 2018-06-12 | Discharge: 2018-06-12 | Disposition: A | Discharge status: Home / Self Care | Payer: BLUE CROSS/BLUE SHIELD | Source: Ambulatory Visit | Attending: Radiation Oncology | Admitting: Radiation Oncology

## 2018-06-12 DIAGNOSIS — C50411 Malignant neoplasm of upper-outer quadrant of right female breast: Secondary | ICD-10-CM | POA: Diagnosis not present

## 2018-06-12 DIAGNOSIS — Z170 Estrogen receptor positive status [ER+]: Secondary | ICD-10-CM | POA: Diagnosis not present

## 2018-06-12 DIAGNOSIS — Z510 Encounter for antineoplastic radiation therapy: Secondary | ICD-10-CM | POA: Diagnosis not present

## 2018-06-15 ENCOUNTER — Other Ambulatory Visit: Payer: Self-pay

## 2018-06-15 ENCOUNTER — Ambulatory Visit
Admission: RE | Admit: 2018-06-15 | Discharge: 2018-06-15 | Disposition: A | Discharge status: Home / Self Care | Payer: BLUE CROSS/BLUE SHIELD | Source: Ambulatory Visit | Attending: Radiation Oncology | Admitting: Radiation Oncology

## 2018-06-15 DIAGNOSIS — C50411 Malignant neoplasm of upper-outer quadrant of right female breast: Secondary | ICD-10-CM | POA: Diagnosis not present

## 2018-06-15 DIAGNOSIS — Z170 Estrogen receptor positive status [ER+]: Secondary | ICD-10-CM | POA: Diagnosis not present

## 2018-06-15 DIAGNOSIS — Z510 Encounter for antineoplastic radiation therapy: Secondary | ICD-10-CM | POA: Diagnosis not present

## 2018-06-16 ENCOUNTER — Ambulatory Visit
Admission: RE | Admit: 2018-06-16 | Discharge: 2018-06-16 | Disposition: A | Discharge status: Home / Self Care | Payer: BLUE CROSS/BLUE SHIELD | Source: Ambulatory Visit | Attending: Radiation Oncology | Admitting: Radiation Oncology

## 2018-06-16 ENCOUNTER — Other Ambulatory Visit: Payer: Self-pay

## 2018-06-16 DIAGNOSIS — Z510 Encounter for antineoplastic radiation therapy: Secondary | ICD-10-CM | POA: Diagnosis not present

## 2018-06-16 DIAGNOSIS — C50411 Malignant neoplasm of upper-outer quadrant of right female breast: Secondary | ICD-10-CM | POA: Diagnosis not present

## 2018-06-16 DIAGNOSIS — Z170 Estrogen receptor positive status [ER+]: Secondary | ICD-10-CM | POA: Diagnosis not present

## 2018-06-17 ENCOUNTER — Other Ambulatory Visit: Payer: Self-pay

## 2018-06-17 ENCOUNTER — Ambulatory Visit
Admission: RE | Admit: 2018-06-17 | Discharge: 2018-06-17 | Disposition: A | Discharge status: Home / Self Care | Payer: BLUE CROSS/BLUE SHIELD | Source: Ambulatory Visit | Attending: Radiation Oncology | Admitting: Radiation Oncology

## 2018-06-17 DIAGNOSIS — C50411 Malignant neoplasm of upper-outer quadrant of right female breast: Secondary | ICD-10-CM | POA: Diagnosis not present

## 2018-06-17 DIAGNOSIS — Z510 Encounter for antineoplastic radiation therapy: Secondary | ICD-10-CM | POA: Diagnosis not present

## 2018-06-17 DIAGNOSIS — Z170 Estrogen receptor positive status [ER+]: Secondary | ICD-10-CM | POA: Diagnosis not present

## 2018-06-18 ENCOUNTER — Inpatient Hospital Stay: Payer: BLUE CROSS/BLUE SHIELD

## 2018-06-18 ENCOUNTER — Other Ambulatory Visit: Payer: Self-pay

## 2018-06-18 ENCOUNTER — Ambulatory Visit
Admission: RE | Admit: 2018-06-18 | Discharge: 2018-06-18 | Disposition: A | Discharge status: Home / Self Care | Payer: BLUE CROSS/BLUE SHIELD | Source: Ambulatory Visit | Attending: Radiation Oncology | Admitting: Radiation Oncology

## 2018-06-18 ENCOUNTER — Inpatient Hospital Stay: Payer: BLUE CROSS/BLUE SHIELD | Attending: Hematology and Oncology

## 2018-06-18 VITALS — BP 132/79 | HR 73 | Temp 98.4°F | Resp 18

## 2018-06-18 DIAGNOSIS — C50411 Malignant neoplasm of upper-outer quadrant of right female breast: Secondary | ICD-10-CM | POA: Insufficient documentation

## 2018-06-18 DIAGNOSIS — Z510 Encounter for antineoplastic radiation therapy: Secondary | ICD-10-CM | POA: Diagnosis not present

## 2018-06-18 DIAGNOSIS — Z170 Estrogen receptor positive status [ER+]: Secondary | ICD-10-CM | POA: Diagnosis not present

## 2018-06-18 DIAGNOSIS — Z5112 Encounter for antineoplastic immunotherapy: Secondary | ICD-10-CM | POA: Insufficient documentation

## 2018-06-18 DIAGNOSIS — Z95828 Presence of other vascular implants and grafts: Secondary | ICD-10-CM

## 2018-06-18 LAB — CBC WITH DIFFERENTIAL (CANCER CENTER ONLY)
Abs Immature Granulocytes: 0.01 10*3/uL (ref 0.00–0.07)
Basophils Absolute: 0 10*3/uL (ref 0.0–0.1)
Basophils Relative: 1 %
Eosinophils Absolute: 0.1 10*3/uL (ref 0.0–0.5)
Eosinophils Relative: 3 %
HCT: 35.4 % — ABNORMAL LOW (ref 36.0–46.0)
Hemoglobin: 11.5 g/dL — ABNORMAL LOW (ref 12.0–15.0)
Immature Granulocytes: 0 %
Lymphocytes Relative: 33 %
Lymphs Abs: 1.1 10*3/uL (ref 0.7–4.0)
MCH: 31 pg (ref 26.0–34.0)
MCHC: 32.5 g/dL (ref 30.0–36.0)
MCV: 95.4 fL (ref 80.0–100.0)
Monocytes Absolute: 0.4 10*3/uL (ref 0.1–1.0)
Monocytes Relative: 11 %
Neutro Abs: 1.7 10*3/uL (ref 1.7–7.7)
Neutrophils Relative %: 52 %
Platelet Count: 173 10*3/uL (ref 150–400)
RBC: 3.71 MIL/uL — ABNORMAL LOW (ref 3.87–5.11)
RDW: 12.3 % (ref 11.5–15.5)
WBC Count: 3.2 10*3/uL — ABNORMAL LOW (ref 4.0–10.5)
nRBC: 0 % (ref 0.0–0.2)

## 2018-06-18 LAB — CMP (CANCER CENTER ONLY)
ALT: 44 U/L (ref 0–44)
AST: 38 U/L (ref 15–41)
Albumin: 3.7 g/dL (ref 3.5–5.0)
Alkaline Phosphatase: 78 U/L (ref 38–126)
Anion gap: 10 (ref 5–15)
BUN: 8 mg/dL (ref 6–20)
CO2: 27 mmol/L (ref 22–32)
Calcium: 9.4 mg/dL (ref 8.9–10.3)
Chloride: 102 mmol/L (ref 98–111)
Creatinine: 0.88 mg/dL (ref 0.44–1.00)
GFR, Est AFR Am: >60 mL/min (ref >60)
GFR, Estimated: >60 mL/min (ref >60)
Glucose, Bld: 87 mg/dL (ref 70–99)
Potassium: 3.7 mmol/L (ref 3.5–5.1)
Sodium: 139 mmol/L (ref 135–145)
Total Bilirubin: 0.3 mg/dL (ref 0.3–1.2)
Total Protein: 7.7 g/dL (ref 6.5–8.1)

## 2018-06-18 MED ORDER — SODIUM CHLORIDE 0.9% FLUSH
10.0000 mL | INTRAVENOUS | Status: DC | PRN
  Administered 2018-06-18: 10 mL
  Filled 2018-06-18: qty 10

## 2018-06-18 MED ORDER — ACETAMINOPHEN 325 MG PO TABS: 650.0000 mg | ORAL_TABLET | Freq: Once | ORAL | Status: DC

## 2018-06-18 MED ORDER — SODIUM CHLORIDE 0.9 % IV SOLN
3.6000 mg/kg | Freq: Once | INTRAVENOUS | Status: AC
  Administered 2018-06-18: 300 mg via INTRAVENOUS
  Filled 2018-06-18: qty 15

## 2018-06-18 MED ORDER — HEPARIN SOD (PORK) LOCK FLUSH 100 UNIT/ML IV SOLN
500.0000 [IU] | Freq: Once | INTRAVENOUS | Status: AC | PRN
  Administered 2018-06-18: 500 [IU]
  Filled 2018-06-18: qty 5

## 2018-06-18 MED ORDER — SODIUM CHLORIDE 0.9 % IV SOLN
Freq: Once | INTRAVENOUS | Status: AC
  Administered 2018-06-18: 10:00:00 via INTRAVENOUS
  Filled 2018-06-18: qty 250

## 2018-06-18 MED ORDER — ACETAMINOPHEN 325 MG PO TABS
ORAL_TABLET | ORAL | Status: AC
  Filled 2018-06-18: qty 2

## 2018-06-18 MED ORDER — DIPHENHYDRAMINE HCL 25 MG PO CAPS
ORAL_CAPSULE | ORAL | Status: AC
  Filled 2018-06-18: qty 2

## 2018-06-18 MED ORDER — DIPHENHYDRAMINE HCL 25 MG PO CAPS: 50.0000 mg | ORAL_CAPSULE | Freq: Once | ORAL | Status: DC

## 2018-06-18 NOTE — Patient Instructions (Signed)
[facility] Discharge Instructions for Patients Receiving Chemotherapy  Today you received the following chemotherapy agents Ado-Trastuzumab (KADCYLA).  To help prevent nausea and vomiting after your treatment, we encourage you to take your nausea medication as prescribed.   If you develop nausea and vomiting that is not controlled by your nausea medication, call the clinic.   BELOW ARE SYMPTOMS THAT SHOULD BE REPORTED IMMEDIATELY:  *FEVER GREATER THAN 100.5 F  *CHILLS WITH OR WITHOUT FEVER  NAUSEA AND VOMITING THAT IS NOT CONTROLLED WITH YOUR NAUSEA MEDICATION  *UNUSUAL SHORTNESS OF BREATH  *UNUSUAL BRUISING OR BLEEDING  TENDERNESS IN MOUTH AND THROAT WITH OR WITHOUT PRESENCE OF ULCERS  *URINARY PROBLEMS  *BOWEL PROBLEMS  UNUSUAL RASH Items with * indicate a potential emergency and should be followed up as soon as possible.  Feel free to call the clinic should you have any questions or concerns. The clinic phone number is [phone].  Please show the CHEMO ALERT CARD at check-in to the Emergency Department and triage nurse.   

## 2018-06-19 ENCOUNTER — Ambulatory Visit
Admission: RE | Admit: 2018-06-19 | Discharge: 2018-06-19 | Disposition: A | Discharge status: Home / Self Care | Payer: BLUE CROSS/BLUE SHIELD | Source: Ambulatory Visit | Attending: Radiation Oncology | Admitting: Radiation Oncology

## 2018-06-19 ENCOUNTER — Other Ambulatory Visit: Payer: Self-pay

## 2018-06-19 DIAGNOSIS — Z170 Estrogen receptor positive status [ER+]: Secondary | ICD-10-CM | POA: Diagnosis not present

## 2018-06-19 DIAGNOSIS — Z510 Encounter for antineoplastic radiation therapy: Secondary | ICD-10-CM | POA: Diagnosis not present

## 2018-06-19 DIAGNOSIS — C50411 Malignant neoplasm of upper-outer quadrant of right female breast: Secondary | ICD-10-CM | POA: Diagnosis not present

## 2018-06-22 ENCOUNTER — Ambulatory Visit
Admission: RE | Admit: 2018-06-22 | Discharge: 2018-06-22 | Disposition: A | Discharge status: Home / Self Care | Payer: BLUE CROSS/BLUE SHIELD | Source: Ambulatory Visit | Attending: Radiation Oncology | Admitting: Radiation Oncology

## 2018-06-22 ENCOUNTER — Other Ambulatory Visit: Payer: Self-pay

## 2018-06-22 DIAGNOSIS — Z510 Encounter for antineoplastic radiation therapy: Secondary | ICD-10-CM | POA: Diagnosis not present

## 2018-06-22 DIAGNOSIS — C50411 Malignant neoplasm of upper-outer quadrant of right female breast: Secondary | ICD-10-CM | POA: Diagnosis not present

## 2018-06-22 DIAGNOSIS — Z170 Estrogen receptor positive status [ER+]: Secondary | ICD-10-CM | POA: Diagnosis not present

## 2018-06-23 ENCOUNTER — Ambulatory Visit
Admission: RE | Admit: 2018-06-23 | Discharge: 2018-06-23 | Disposition: A | Discharge status: Home / Self Care | Payer: BLUE CROSS/BLUE SHIELD | Source: Ambulatory Visit | Attending: Radiation Oncology | Admitting: Radiation Oncology

## 2018-06-23 ENCOUNTER — Other Ambulatory Visit: Payer: Self-pay

## 2018-06-23 DIAGNOSIS — Z170 Estrogen receptor positive status [ER+]: Secondary | ICD-10-CM | POA: Diagnosis not present

## 2018-06-23 DIAGNOSIS — C50411 Malignant neoplasm of upper-outer quadrant of right female breast: Secondary | ICD-10-CM | POA: Diagnosis not present

## 2018-06-23 DIAGNOSIS — Z510 Encounter for antineoplastic radiation therapy: Secondary | ICD-10-CM | POA: Diagnosis not present

## 2018-06-24 ENCOUNTER — Ambulatory Visit
Admission: RE | Admit: 2018-06-24 | Discharge: 2018-06-24 | Disposition: A | Discharge status: Home / Self Care | Payer: BLUE CROSS/BLUE SHIELD | Source: Ambulatory Visit | Attending: Radiation Oncology | Admitting: Radiation Oncology

## 2018-06-24 ENCOUNTER — Other Ambulatory Visit: Payer: Self-pay

## 2018-06-24 DIAGNOSIS — F4323 Adjustment disorder with mixed anxiety and depressed mood: Secondary | ICD-10-CM | POA: Diagnosis not present

## 2018-06-24 DIAGNOSIS — Z510 Encounter for antineoplastic radiation therapy: Secondary | ICD-10-CM | POA: Diagnosis not present

## 2018-06-24 DIAGNOSIS — C50411 Malignant neoplasm of upper-outer quadrant of right female breast: Secondary | ICD-10-CM | POA: Diagnosis not present

## 2018-06-24 DIAGNOSIS — Z170 Estrogen receptor positive status [ER+]: Secondary | ICD-10-CM | POA: Diagnosis not present

## 2018-06-25 ENCOUNTER — Other Ambulatory Visit: Payer: Self-pay

## 2018-06-25 ENCOUNTER — Ambulatory Visit
Admission: RE | Admit: 2018-06-25 | Discharge: 2018-06-25 | Disposition: A | Discharge status: Home / Self Care | Payer: BLUE CROSS/BLUE SHIELD | Source: Ambulatory Visit | Attending: Radiation Oncology | Admitting: Radiation Oncology

## 2018-06-25 DIAGNOSIS — C50411 Malignant neoplasm of upper-outer quadrant of right female breast: Secondary | ICD-10-CM | POA: Diagnosis not present

## 2018-06-25 DIAGNOSIS — Z510 Encounter for antineoplastic radiation therapy: Secondary | ICD-10-CM | POA: Diagnosis not present

## 2018-06-25 DIAGNOSIS — Z170 Estrogen receptor positive status [ER+]: Secondary | ICD-10-CM | POA: Diagnosis not present

## 2018-06-26 ENCOUNTER — Ambulatory Visit
Admission: RE | Admit: 2018-06-26 | Discharge: 2018-06-26 | Disposition: A | Discharge status: Home / Self Care | Payer: BLUE CROSS/BLUE SHIELD | Source: Ambulatory Visit | Attending: Radiation Oncology | Admitting: Radiation Oncology

## 2018-06-26 ENCOUNTER — Other Ambulatory Visit: Payer: Self-pay

## 2018-06-26 DIAGNOSIS — Z170 Estrogen receptor positive status [ER+]: Secondary | ICD-10-CM | POA: Diagnosis not present

## 2018-06-26 DIAGNOSIS — Z510 Encounter for antineoplastic radiation therapy: Secondary | ICD-10-CM | POA: Diagnosis not present

## 2018-06-26 DIAGNOSIS — C50411 Malignant neoplasm of upper-outer quadrant of right female breast: Secondary | ICD-10-CM | POA: Diagnosis not present

## 2018-06-29 ENCOUNTER — Ambulatory Visit
Admission: RE | Admit: 2018-06-29 | Discharge: 2018-06-29 | Disposition: A | Discharge status: Home / Self Care | Payer: BLUE CROSS/BLUE SHIELD | Source: Ambulatory Visit | Attending: Radiation Oncology | Admitting: Radiation Oncology

## 2018-06-29 ENCOUNTER — Other Ambulatory Visit: Payer: Self-pay

## 2018-06-29 DIAGNOSIS — Z510 Encounter for antineoplastic radiation therapy: Secondary | ICD-10-CM | POA: Diagnosis not present

## 2018-06-29 DIAGNOSIS — C50411 Malignant neoplasm of upper-outer quadrant of right female breast: Secondary | ICD-10-CM | POA: Diagnosis not present

## 2018-06-29 DIAGNOSIS — Z170 Estrogen receptor positive status [ER+]: Secondary | ICD-10-CM | POA: Diagnosis not present

## 2018-06-30 ENCOUNTER — Other Ambulatory Visit: Payer: Self-pay

## 2018-06-30 ENCOUNTER — Ambulatory Visit
Admission: RE | Admit: 2018-06-30 | Discharge: 2018-06-30 | Disposition: A | Discharge status: Home / Self Care | Payer: BLUE CROSS/BLUE SHIELD | Source: Ambulatory Visit | Attending: Radiation Oncology | Admitting: Radiation Oncology

## 2018-06-30 DIAGNOSIS — C50411 Malignant neoplasm of upper-outer quadrant of right female breast: Secondary | ICD-10-CM | POA: Diagnosis not present

## 2018-06-30 DIAGNOSIS — Z510 Encounter for antineoplastic radiation therapy: Secondary | ICD-10-CM | POA: Diagnosis not present

## 2018-06-30 DIAGNOSIS — Z170 Estrogen receptor positive status [ER+]: Secondary | ICD-10-CM | POA: Diagnosis not present

## 2018-07-01 ENCOUNTER — Ambulatory Visit
Admission: RE | Admit: 2018-07-01 | Discharge: 2018-07-01 | Disposition: A | Discharge status: Home / Self Care | Payer: BLUE CROSS/BLUE SHIELD | Source: Ambulatory Visit | Attending: Radiation Oncology | Admitting: Radiation Oncology

## 2018-07-01 ENCOUNTER — Other Ambulatory Visit: Payer: Self-pay

## 2018-07-01 DIAGNOSIS — C50411 Malignant neoplasm of upper-outer quadrant of right female breast: Secondary | ICD-10-CM | POA: Diagnosis not present

## 2018-07-01 DIAGNOSIS — Z510 Encounter for antineoplastic radiation therapy: Secondary | ICD-10-CM | POA: Diagnosis not present

## 2018-07-01 DIAGNOSIS — Z170 Estrogen receptor positive status [ER+]: Secondary | ICD-10-CM | POA: Diagnosis not present

## 2018-07-02 ENCOUNTER — Ambulatory Visit
Admission: RE | Admit: 2018-07-02 | Discharge: 2018-07-02 | Disposition: A | Discharge status: Home / Self Care | Payer: BLUE CROSS/BLUE SHIELD | Source: Ambulatory Visit | Attending: Radiation Oncology | Admitting: Radiation Oncology

## 2018-07-02 ENCOUNTER — Other Ambulatory Visit: Payer: Self-pay

## 2018-07-02 DIAGNOSIS — Z170 Estrogen receptor positive status [ER+]: Secondary | ICD-10-CM | POA: Diagnosis not present

## 2018-07-02 DIAGNOSIS — C50411 Malignant neoplasm of upper-outer quadrant of right female breast: Secondary | ICD-10-CM | POA: Diagnosis not present

## 2018-07-02 DIAGNOSIS — Z510 Encounter for antineoplastic radiation therapy: Secondary | ICD-10-CM | POA: Diagnosis not present

## 2018-07-03 ENCOUNTER — Other Ambulatory Visit: Payer: Self-pay

## 2018-07-03 ENCOUNTER — Ambulatory Visit
Admission: RE | Admit: 2018-07-03 | Discharge: 2018-07-03 | Disposition: A | Discharge status: Home / Self Care | Payer: BLUE CROSS/BLUE SHIELD | Source: Ambulatory Visit | Attending: Radiation Oncology | Admitting: Radiation Oncology

## 2018-07-03 ENCOUNTER — Ambulatory Visit: Payer: BLUE CROSS/BLUE SHIELD | Admitting: Radiation Oncology

## 2018-07-03 DIAGNOSIS — C50411 Malignant neoplasm of upper-outer quadrant of right female breast: Secondary | ICD-10-CM | POA: Diagnosis not present

## 2018-07-03 DIAGNOSIS — Z170 Estrogen receptor positive status [ER+]: Secondary | ICD-10-CM | POA: Diagnosis not present

## 2018-07-03 DIAGNOSIS — Z510 Encounter for antineoplastic radiation therapy: Secondary | ICD-10-CM | POA: Diagnosis not present

## 2018-07-06 ENCOUNTER — Other Ambulatory Visit: Payer: Self-pay

## 2018-07-06 ENCOUNTER — Ambulatory Visit
Admission: RE | Admit: 2018-07-06 | Discharge: 2018-07-06 | Disposition: A | Discharge status: Home / Self Care | Payer: BLUE CROSS/BLUE SHIELD | Source: Ambulatory Visit | Attending: Radiation Oncology | Admitting: Radiation Oncology

## 2018-07-06 DIAGNOSIS — Z510 Encounter for antineoplastic radiation therapy: Secondary | ICD-10-CM | POA: Diagnosis not present

## 2018-07-06 DIAGNOSIS — C50411 Malignant neoplasm of upper-outer quadrant of right female breast: Secondary | ICD-10-CM | POA: Diagnosis not present

## 2018-07-06 DIAGNOSIS — Z170 Estrogen receptor positive status [ER+]: Secondary | ICD-10-CM | POA: Diagnosis not present

## 2018-07-07 ENCOUNTER — Other Ambulatory Visit: Payer: Self-pay

## 2018-07-07 ENCOUNTER — Ambulatory Visit
Admission: RE | Admit: 2018-07-07 | Discharge: 2018-07-07 | Disposition: A | Discharge status: Home / Self Care | Payer: BLUE CROSS/BLUE SHIELD | Source: Ambulatory Visit | Attending: Radiation Oncology | Admitting: Radiation Oncology

## 2018-07-07 DIAGNOSIS — Z510 Encounter for antineoplastic radiation therapy: Secondary | ICD-10-CM | POA: Diagnosis not present

## 2018-07-07 DIAGNOSIS — Z170 Estrogen receptor positive status [ER+]: Secondary | ICD-10-CM | POA: Diagnosis not present

## 2018-07-07 DIAGNOSIS — C50411 Malignant neoplasm of upper-outer quadrant of right female breast: Secondary | ICD-10-CM | POA: Diagnosis not present

## 2018-07-08 ENCOUNTER — Other Ambulatory Visit: Payer: Self-pay

## 2018-07-08 ENCOUNTER — Ambulatory Visit
Admission: RE | Admit: 2018-07-08 | Discharge: 2018-07-08 | Disposition: A | Discharge status: Home / Self Care | Payer: BLUE CROSS/BLUE SHIELD | Source: Ambulatory Visit | Attending: Radiation Oncology | Admitting: Radiation Oncology

## 2018-07-08 DIAGNOSIS — Z510 Encounter for antineoplastic radiation therapy: Secondary | ICD-10-CM | POA: Diagnosis not present

## 2018-07-08 DIAGNOSIS — Z170 Estrogen receptor positive status [ER+]: Secondary | ICD-10-CM | POA: Diagnosis not present

## 2018-07-08 DIAGNOSIS — C50411 Malignant neoplasm of upper-outer quadrant of right female breast: Secondary | ICD-10-CM | POA: Diagnosis not present

## 2018-07-09 ENCOUNTER — Inpatient Hospital Stay: Payer: BLUE CROSS/BLUE SHIELD

## 2018-07-09 ENCOUNTER — Telehealth: Payer: Self-pay

## 2018-07-09 ENCOUNTER — Inpatient Hospital Stay (HOSPITAL_BASED_OUTPATIENT_CLINIC_OR_DEPARTMENT_OTHER): Payer: BLUE CROSS/BLUE SHIELD | Admitting: Adult Health

## 2018-07-09 ENCOUNTER — Inpatient Hospital Stay: Payer: BLUE CROSS/BLUE SHIELD | Attending: Adult Health

## 2018-07-09 ENCOUNTER — Encounter: Payer: Self-pay | Admitting: Adult Health

## 2018-07-09 ENCOUNTER — Other Ambulatory Visit: Payer: Self-pay

## 2018-07-09 ENCOUNTER — Ambulatory Visit
Admission: RE | Admit: 2018-07-09 | Discharge: 2018-07-09 | Disposition: A | Discharge status: Home / Self Care | Payer: BLUE CROSS/BLUE SHIELD | Source: Ambulatory Visit | Attending: Radiation Oncology | Admitting: Radiation Oncology

## 2018-07-09 VITALS — BP 115/71 | HR 94 | Temp 99.5°F | Resp 18 | Ht 71.0 in | Wt 178.8 lb

## 2018-07-09 DIAGNOSIS — R239 Unspecified skin changes: Secondary | ICD-10-CM

## 2018-07-09 DIAGNOSIS — C50411 Malignant neoplasm of upper-outer quadrant of right female breast: Secondary | ICD-10-CM

## 2018-07-09 DIAGNOSIS — R413 Other amnesia: Secondary | ICD-10-CM | POA: Diagnosis not present

## 2018-07-09 DIAGNOSIS — Z170 Estrogen receptor positive status [ER+]: Secondary | ICD-10-CM

## 2018-07-09 DIAGNOSIS — Z510 Encounter for antineoplastic radiation therapy: Secondary | ICD-10-CM | POA: Diagnosis not present

## 2018-07-09 DIAGNOSIS — Z95828 Presence of other vascular implants and grafts: Secondary | ICD-10-CM

## 2018-07-09 DIAGNOSIS — R4701 Aphasia: Secondary | ICD-10-CM

## 2018-07-09 LAB — CMP (CANCER CENTER ONLY)
ALT: 43 U/L (ref 0–44)
AST: 32 U/L (ref 15–41)
Albumin: 3.5 g/dL (ref 3.5–5.0)
Alkaline Phosphatase: 74 U/L (ref 38–126)
Anion gap: 10 (ref 5–15)
BUN: 11 mg/dL (ref 6–20)
CO2: 26 mmol/L (ref 22–32)
Calcium: 8.8 mg/dL — ABNORMAL LOW (ref 8.9–10.3)
Chloride: 104 mmol/L (ref 98–111)
Creatinine: 0.91 mg/dL (ref 0.44–1.00)
GFR, Est AFR Am: >60 mL/min (ref >60)
GFR, Estimated: >60 mL/min (ref >60)
Glucose, Bld: 108 mg/dL — ABNORMAL HIGH (ref 70–99)
Potassium: 3.4 mmol/L — ABNORMAL LOW (ref 3.5–5.1)
Sodium: 140 mmol/L (ref 135–145)
Total Bilirubin: 0.3 mg/dL (ref 0.3–1.2)
Total Protein: 7.2 g/dL (ref 6.5–8.1)

## 2018-07-09 LAB — CBC WITH DIFFERENTIAL (CANCER CENTER ONLY)
Abs Immature Granulocytes: 0.01 10*3/uL (ref 0.00–0.07)
Basophils Absolute: 0 10*3/uL (ref 0.0–0.1)
Basophils Relative: 0 %
Eosinophils Absolute: 0.2 10*3/uL (ref 0.0–0.5)
Eosinophils Relative: 5 %
HCT: 33.5 % — ABNORMAL LOW (ref 36.0–46.0)
Hemoglobin: 10.9 g/dL — ABNORMAL LOW (ref 12.0–15.0)
Immature Granulocytes: 0 %
Lymphocytes Relative: 33 %
Lymphs Abs: 1 10*3/uL (ref 0.7–4.0)
MCH: 30.4 pg (ref 26.0–34.0)
MCHC: 32.5 g/dL (ref 30.0–36.0)
MCV: 93.6 fL (ref 80.0–100.0)
Monocytes Absolute: 0.5 10*3/uL (ref 0.1–1.0)
Monocytes Relative: 15 %
Neutro Abs: 1.4 10*3/uL — ABNORMAL LOW (ref 1.7–7.7)
Neutrophils Relative %: 47 %
Platelet Count: 151 10*3/uL (ref 150–400)
RBC: 3.58 MIL/uL — ABNORMAL LOW (ref 3.87–5.11)
RDW: 12.4 % (ref 11.5–15.5)
WBC Count: 3 10*3/uL — ABNORMAL LOW (ref 4.0–10.5)
nRBC: 0 % (ref 0.0–0.2)

## 2018-07-09 MED ORDER — SODIUM CHLORIDE 0.9 % IV SOLN
3.6000 mg/kg | Freq: Once | INTRAVENOUS | Status: AC
  Administered 2018-07-09: 12:00:00 300 mg via INTRAVENOUS
  Filled 2018-07-09: qty 15

## 2018-07-09 MED ORDER — SODIUM CHLORIDE 0.9% FLUSH
10.0000 mL | INTRAVENOUS | Status: DC | PRN
  Administered 2018-07-09: 10 mL
  Filled 2018-07-09: qty 10

## 2018-07-09 MED ORDER — ACETAMINOPHEN 325 MG PO TABS: 650.0000 mg | ORAL_TABLET | Freq: Once | ORAL | Status: DC

## 2018-07-09 MED ORDER — DIPHENHYDRAMINE HCL 25 MG PO CAPS: 50.0000 mg | ORAL_CAPSULE | Freq: Once | ORAL | Status: DC

## 2018-07-09 MED ORDER — HEPARIN SOD (PORK) LOCK FLUSH 100 UNIT/ML IV SOLN
500.0000 [IU] | Freq: Once | INTRAVENOUS | Status: AC | PRN
  Administered 2018-07-09: 500 [IU]
  Filled 2018-07-09: qty 5

## 2018-07-09 MED ORDER — SODIUM CHLORIDE 0.9 % IV SOLN
Freq: Once | INTRAVENOUS | Status: AC
  Administered 2018-07-09: 11:00:00 via INTRAVENOUS
  Filled 2018-07-09: qty 250

## 2018-07-09 NOTE — Telephone Encounter (Signed)
Patient at center getting treatment.  This nurse contacted RN working with patient to let her know that potassium is slightly low and per NP increase potassium in diet.

## 2018-07-09 NOTE — Progress Notes (Signed)
Patient made aware that per L. Causey, NP she needs to increase her potassium intake in foods due to potassium level of 3.4. Patient verbalized understanding.

## 2018-07-09 NOTE — Patient Instructions (Signed)
[facility] Discharge Instructions for Patients Receiving Chemotherapy  Today you received the following chemotherapy agents Kadcyla  To help prevent nausea and vomiting after your treatment, we encourage you to take your nausea medication as directed   If you develop nausea and vomiting that is not controlled by your nausea medication, call the clinic.   BELOW ARE SYMPTOMS THAT SHOULD BE REPORTED IMMEDIATELY:  *FEVER GREATER THAN 100.5 F  *CHILLS WITH OR WITHOUT FEVER  NAUSEA AND VOMITING THAT IS NOT CONTROLLED WITH YOUR NAUSEA MEDICATION  *UNUSUAL SHORTNESS OF BREATH  *UNUSUAL BRUISING OR BLEEDING  TENDERNESS IN MOUTH AND THROAT WITH OR WITHOUT PRESENCE OF ULCERS  *URINARY PROBLEMS  *BOWEL PROBLEMS  UNUSUAL RASH Items with * indicate a potential emergency and should be followed up as soon as possible.  Feel free to call the clinic should you have any questions or concerns. The clinic phone number is [phone].  Please show the CHEMO ALERT CARD at check-in to the Emergency Department and triage nurse.   

## 2018-07-09 NOTE — Progress Notes (Signed)
[facility] Follow up:    Sandra [name], [place] [address]   DIAGNOSIS: Cancer Staging Malignant neoplasm of upper-outer quadrant of right breast in female, estrogen receptor positive (Sandra [name]) Staging form: Breast, AJCC 8th Edition - Clinical: Stage IB (cT2, cN0, cM0, G3, ER+, PR+, HER2+) - Signed by Nicholas Lose, MD on 12/04/2017 - Pathologic: No Stage Recommended (ypT1a(m), pN0, cM0) - Unsigned   SUMMARY OF ONCOLOGIC HISTORY:   Malignant neoplasm of upper-outer quadrant of right breast in female, estrogen receptor positive (Sandra [name])   11/11/2017 Initial Diagnosis    Palpable right breast mass at 10 o'clock position 1.7 cm, axilla negative, biopsy revealed grade 3 IDC ER 60%, PR 10%, Ki-67 70%, HER-2 positive, T1 CN 0 stage Ia    11/19/2017 Cancer Staging    Staging form: Breast, AJCC 8th Edition - Clinical: Stage IB (cT2, cN0, cM0, G3, ER+, PR+, HER2+) - Signed by Nicholas Lose, MD on 12/04/2017    11/25/2017 Breast MRI    4.4 x 2.8 x 2.1 cm right breast malignancy UOQ, second focus 0.8 cm (biopsy planned for 12/09/2017)    12/04/2017 - 03/26/2018 Neo-Adjuvant Chemotherapy    TCH Perjeta every 3 week x6 followed by Herceptin Perjeta maintenance    01/04/2018 Genetic Testing    Negative genetic testing on the multi-cancer panel.  The Multi-Gene Panel offered by [facility] includes sequencing and/or deletion duplication testing of the following 84 genes: AIP, ALK, APC, ATM, AXIN2,BAP1,  BARD1, BLM, BMPR1A, BRCA1, BRCA2, BRIP1, CASR, CDC73, CDH1, CDK4, CDKN1B, CDKN1C, CDKN2A (p14ARF), CDKN2A (p16INK4a), CEBPA, CHEK2, CTNNA1, DICER1, DIS3L2, EGFR (c.2369C>T, p.Thr790Met variant only), EPCAM (Deletion/duplication testing only), FH, FLCN, GATA2, GPC3, GREM1 (Promoter region deletion/duplication testing only), HOXB13 (c.251G>A, p.Gly84Glu), HRAS, KIT, MAX, MEN1, MET, MITF (c.952G>A, p.Glu318Lys variant only), MLH1, MSH2, MSH3, MSH6, MUTYH, NBN, NF1, NF2, NTHL1,  PALB2, PDGFRA, PHOX2B, PMS2, POLD1, POLE, POT1, PRKAR1A, PTCH1, PTEN, RAD50, RAD51C, RAD51D, RB1, RECQL4, RET, RUNX1, SDHAF2, SDHA (sequence changes only), SDHB, SDHC, SDHD, SMAD4, SMARCA4, SMARCB1, SMARCE1, STK11, SUFU, TERC, TERT, TMEM127, TP53, TSC1, TSC2, VHL, WRN and WT1.  The report date is January 04, 2018.    04/23/2018 Surgery    Right lumpectomy: Small residual invasive cancer multifocal 0.15 cm, grade 2, 0/3 lymph nodes all negative, ER 90%, PR 60%, HER-2 +3+ by IHC, Ki-67 20%, RCB class I, ympT1a, ypN0    05/07/2018 -  Chemotherapy    The patient had ado-trastuzumab emtansine (KADCYLA) 300 mg in sodium chloride 0.9 % 250 mL chemo infusion, 3.6 mg/kg = 300 mg, Intravenous, Once, 3 of 11 cycles Administration: 300 mg (05/07/2018), 300 mg (05/28/2018), 300 mg (06/18/2018)  for chemotherapy treatment.      CURRENT THERAPY: Kadcyla  INTERVAL HISTORY: Sandra [name] 53 y.o. female returns for evaluation prior to receiving [place]. She notes that she is tolerating her treatments well.  She has some short term memory loss, and word finding difficulty.  She noted that since starting her treatments.  It is stable.   She is also undergoing adjuvant radiation.  She has some skin redness, but no other concerns.  She is not exercising.  She thinks this is due to lack of motivation.     Patient Active Problem List   Diagnosis Date Noted  . Genetic testing 01/05/2018  . Family history of breast cancer   . Family history of prostate cancer   . Family history of colon cancer   . Port-A-Cath in place 12/10/2017  .  Malignant neoplasm of upper-outer quadrant of right breast in female, estrogen receptor positive (Sandra [name]) 11/14/2017  . Melanocytic nevus of trunk 10/23/2017  . Other seborrheic keratosis 10/23/2017  . Hemangioma of skin and subcutaneous tissue 10/23/2017  . Other melanin hyperpigmentation 10/23/2017  . Skin mole 04/21/2017  . Depression, recurrent ([place]) 04/21/2017  . Obesity (BMI 30-39.9)  04/21/2017  . Pure hypercholesterolemia 04/21/2017  . Vitamin D deficiency 04/21/2017  . Lichen sclerosus et atrophicus of the vulva 04/21/2017  . 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 Sleep related headaches 01/08/2016  . Bruxism 01/08/2016  . Menstrual headache 10/28/2013  . Other and unspecified hyperlipidemia 02/11/2013    has No Known Allergies.  MEDICAL HISTORY: Past Medical History:  Diagnosis Date  . Breast cancer (Sandra [name]) 11/2017   rt.Br.  . Depression   . Endometriosis, diagnosis via laparoscopy   . Family history of breast cancer   . Family history of breast cancer   . Family history of colon cancer   . Family history of prostate cancer   . Hyperlipidemia 2017  . Lichen sclerosus, vulva   . Low serum vitamin D 2017    SURGICAL HISTORY: Past Surgical History:  Procedure Laterality Date  . APPENDECTOMY  2005  . BREAST LUMPECTOMY WITH RADIOACTIVE SEED AND SENTINEL LYMPH NODE BIOPSY Right 04/23/2018   Procedure: RIGHT BREAST WITH RADIOACTIVE SEED LUMPECTOMY x3 AND SENTINEL LYMPH NODE MAPPING;  Surgeon: Erroll Luna, MD;  Location: [facility];  Service: General;  Laterality: Right;  . CESAREAN SECTION  2002  . MANDIBLE RECONSTRUCTION  1985   wires inside jaw according to pt  . PELVIC LAPAROSCOPY  2004  . PORTACATH PLACEMENT Right 12/03/2017   Procedure: INSERTION PORT-A-CATH;  Surgeon: Erroll Luna, MD;  Location: [facility];  Service: General;  Laterality: Right;  . SHIN SURGERY  1998    SOCIAL HISTORY: Social History   Socioeconomic History  . Marital status: Married    Spouse name: Sandra [name]  . Number of children: 2  . Years of education: Not on file  . Highest education level: Bachelor's degree (e.g., BA, AB, BS)  Occupational History  . Not on file  Social Needs  . Financial resource strain: Patient refused  . Food insecurity:    Worry: Patient refused    Inability: Patient refused  . Transportation needs:    Medical: No    Non-medical: No   Tobacco Use  . Smoking status: Never Smoker  . Smokeless tobacco: Never Used  Substance and Sexual Activity  . Alcohol use: No    Alcohol/week: 0.0 standard drinks  . Drug use: No  . Sexual activity: Yes    Partners: Male    Comment: Mirena inserted 05/16/16, IUD is to be removed08/29/19  Lifestyle  . Physical activity:    Days per week: Patient refused    Minutes per session: Patient refused  . Stress: Patient refused  Relationships  . Social connections:    Talks on phone: Patient refused    Gets together: Not on file    Attends religious service: Not on file    Active member of club or organization: Not on file    Attends meetings of clubs or organizations: Not on file    Relationship status: Not on file  . Intimate partner violence:    Fear of current or ex partner: No    Emotionally abused: No    Physically abused: No    Forced sexual activity: No  Other Topics Concern  .  Not on file  Social History Narrative  . Not on file    FAMILY HISTORY: Family History  Problem Relation Age of Onset  . Stroke Mother   . Heart disease Mother   . Colon cancer Father   . Prostate cancer Father   . Hypertension Brother   . Cancer Maternal Uncle        NOS  . Breast cancer Paternal Aunt        dx in her 65s  . Prostate cancer Paternal Uncle   . Alcohol abuse Maternal Grandfather   . Breast cancer Paternal Grandmother        dx in her 86s  . Prostate cancer Paternal Grandfather   . SIDS Brother   . Cancer Paternal Aunt   . Tuberculosis Paternal Uncle     Review of Systems  Constitutional: Negative for appetite change, chills, fatigue, fever and unexpected weight change.  HENT:   Negative for hearing loss, lump/mass, mouth sores, nosebleeds, sore throat and trouble swallowing.   Eyes: Negative for eye problems and icterus.  Respiratory: Negative for chest tightness, cough and shortness of breath.   Cardiovascular: Negative for chest pain, leg swelling and palpitations.   Gastrointestinal: Negative for abdominal distention, abdominal pain, constipation, diarrhea, nausea and vomiting.  Endocrine: Negative for hot flashes.  Musculoskeletal: Negative for arthralgias.  Skin: Negative for itching and rash.  Neurological: Negative for dizziness, extremity weakness, headaches and numbness.  Hematological: Negative for adenopathy. Does not bruise/bleed easily.  Psychiatric/Behavioral: Negative for depression. The patient is not nervous/anxious.       PHYSICAL EXAMINATION  ECOG PERFORMANCE STATUS: 1 - Symptomatic but completely ambulatory  Vitals:   07/09/18 0944  BP: 115/71  Pulse: 94  Resp: 18  Temp: 99.5 F (37.5 C)  SpO2: 100%    Physical Exam Constitutional:      General: She is not in acute distress.    Appearance: Normal appearance. She is not toxic-appearing.  HENT:     Head: Normocephalic and atraumatic.     Nose: Nose normal.     Mouth/Throat:     Mouth: Mucous membranes are moist.     Pharynx: Oropharynx is clear.  Eyes:     General: No scleral icterus.    Pupils: Pupils are equal, round, and reactive to light.  Neck:     Musculoskeletal: Neck supple.  Cardiovascular:     Rate and Rhythm: Normal rate and regular rhythm.     Heart sounds: Normal heart sounds.  Pulmonary:     Effort: Pulmonary effort is normal.     Breath sounds: Normal breath sounds.  Abdominal:     General: Abdomen is flat. Bowel sounds are normal. There is no distension.     Palpations: Abdomen is soft.     Tenderness: There is no abdominal tenderness.  Musculoskeletal:        General: No swelling.  Lymphadenopathy:     Cervical: No cervical adenopathy.  Skin:    General: Skin is warm and dry.     Capillary Refill: Capillary refill takes less than 2 seconds.  Neurological:     General: No focal deficit present.     Mental Status: She is alert.  Psychiatric:        Mood and Affect: Mood normal.        Behavior: Behavior normal.     LABORATORY  DATA:  CBC    Component Value Date/Time   WBC 3.0 (L) 07/09/2018 0924   WBC  3.5 (L) 04/17/2018 0824   RBC 3.58 (L) 07/09/2018 0924   HGB 10.9 (L) 07/09/2018 0924   HGB 13.3 12/26/2016 1106   HGB 13.3 11/11/2014 0854   HCT 33.5 (L) 07/09/2018 0924   HCT 39.2 12/26/2016 1106   PLT 151 07/09/2018 0924   PLT 257 12/26/2016 1106   MCV 93.6 07/09/2018 0924   MCV 90 12/26/2016 1106   MCH 30.4 07/09/2018 0924   MCHC 32.5 07/09/2018 0924   RDW 12.4 07/09/2018 0924   RDW 13.9 12/26/2016 1106   LYMPHSABS 1.0 07/09/2018 0924   MONOABS 0.5 07/09/2018 0924   EOSABS 0.2 07/09/2018 0924   BASOSABS 0.0 07/09/2018 0924    CMP     Component Value Date/Time   NA 139 06/18/2018 0752   NA 142 12/26/2016 1106   K 3.7 06/18/2018 0752   CL 102 06/18/2018 0752   CO2 27 06/18/2018 0752   GLUCOSE 87 06/18/2018 0752   BUN 8 06/18/2018 0752   BUN 9 12/26/2016 1106   CREATININE 0.88 06/18/2018 0752   CREATININE 0.76 12/07/2015 1004   CALCIUM 9.4 06/18/2018 0752   PROT 7.7 06/18/2018 0752   PROT 7.1 12/26/2016 1106   ALBUMIN 3.7 06/18/2018 0752   ALBUMIN 4.4 12/26/2016 1106   AST 38 06/18/2018 0752   ALT 44 06/18/2018 0752   ALKPHOS 78 06/18/2018 0752   BILITOT 0.3 06/18/2018 0752   GFRNONAA >60 06/18/2018 0752   GFRAA >60 06/18/2018 0752         ASSESSMENT and THERAPY PLAN:   Malignant neoplasm of upper-outer quadrant of right breast in female, estrogen receptor positive ([place]) 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  Treatment plan: 1.Neoadjuvant chemotherapy with [place] Perjeta  12/04/2017-12/26/2019followed by Kadcyla x 1 year 2.breast conserving surgery with sentinel lymph node biopsy: 04/23/2018 3.Radiation therapy: to finish up on 07/15/2018 4.Adjuvant antiestrogen therapy -------------------------------------------------------------------------------------------------------------------------------- 04/23/2018:Right lumpectomy: Small residual invasive cancer multifocal 0.15 cm, grade 2, 0/3 lymph nodes all negative, ER 90%, PR 60%, HER-2 +3+ by IHC, Ki-67 20%, RCB class I, ympT1a, ypN0  Echo 03/11/2018: EF 60-65%  [place]: she is tolerating this well.  Her labs are stable, we will continue to monitor.  She is due for an echo which I ordered and we scheduled for tomorrow.  I reviewed with her activities that may help with cognitive disfunction such as mindfulness, meditation, crossword puzzles, and word searches.    She will finish up her radiation next week.  She is beginning to have some skin changes, but overall is tolerating this well.    She will return every three weeks for Kadcyla with follow up with myself or Dr. Lindi Adie every 6 weeks.     Orders Placed This Encounter  Procedures  . ECHOCARDIOGRAM COMPLETE    Standing Status:   Future    Standing Expiration Date:   10/08/2019    Order Specific Question:   Where should this test be performed    Answer:   [facility]    Order Specific Question:   Perflutren DEFINITY (image enhancing agent) should be administered unless hypersensitivity or allergy exist  Answer:   Administer Perflutren    Order Specific Question:   Reason for exam-Echo    Answer:   Chemotherapy evaluation  v87.41 / v58.11    All questions were answered. The patient knows to call the clinic with any problems, questions or concerns. We can certainly see the patient much sooner if necessary.  A total of (20) minutes of face-to-face time was spent with this patient with greater than 50% of that time in counseling and  care-coordination.  This note was electronically signed. Scot Dock, NP 07/09/2018

## 2018-07-09 NOTE — Progress Notes (Signed)
Per Wilber Bihari, NP it is ok to treat today with ANC 1.4

## 2018-07-09 NOTE — Assessment & Plan Note (Addendum)
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  Treatment plan: 1.Neoadjuvant chemotherapy with [place] Perjeta 12/04/2017-12/26/2019followed by Kadcyla x 1 year 2.breast conserving surgery with sentinel lymph node biopsy: 04/23/2018 3.Radiation therapy: to finish up on 07/15/2018 4.Adjuvant antiestrogen therapy -------------------------------------------------------------------------------------------------------------------------------- 04/23/2018:Right lumpectomy: Small residual invasive cancer multifocal 0.15 cm, grade 2, 0/3 lymph nodes all negative, ER 90%, PR 60%, HER-2 +3+ by IHC, Ki-67 20%, RCB class I, ympT1a, ypN0  Echo 03/11/2018: EF 60-65%  [place]: she is tolerating this well.  Her labs are stable, we will continue to monitor.  She is due for an echo which I ordered and we scheduled for tomorrow.  I reviewed with her activities that may help with cognitive disfunction such as mindfulness, meditation, crossword puzzles, and word searches.    She will finish up her radiation next week.  She is beginning to have some skin changes, but overall is tolerating this well.    She will return every three weeks for Kadcyla with follow up with myself or Dr. Lindi Adie every 6 weeks.

## 2018-07-10 ENCOUNTER — Other Ambulatory Visit: Payer: Self-pay

## 2018-07-10 ENCOUNTER — Ambulatory Visit (HOSPITAL_COMMUNITY)
Admission: RE | Admit: 2018-07-10 | Discharge: 2018-07-10 | Disposition: A | Discharge status: Home / Self Care | Payer: BLUE CROSS/BLUE SHIELD | Source: Ambulatory Visit | Attending: Adult Health | Admitting: Adult Health

## 2018-07-10 ENCOUNTER — Ambulatory Visit
Admission: RE | Admit: 2018-07-10 | Discharge: 2018-07-10 | Disposition: A | Discharge status: Home / Self Care | Payer: BLUE CROSS/BLUE SHIELD | Source: Ambulatory Visit | Attending: Radiation Oncology | Admitting: Radiation Oncology

## 2018-07-10 DIAGNOSIS — E785 Hyperlipidemia, unspecified: Secondary | ICD-10-CM | POA: Diagnosis not present

## 2018-07-10 DIAGNOSIS — I358 Other nonrheumatic aortic valve disorders: Secondary | ICD-10-CM | POA: Insufficient documentation

## 2018-07-10 DIAGNOSIS — C50411 Malignant neoplasm of upper-outer quadrant of right female breast: Secondary | ICD-10-CM | POA: Insufficient documentation

## 2018-07-10 DIAGNOSIS — Z510 Encounter for antineoplastic radiation therapy: Secondary | ICD-10-CM | POA: Diagnosis not present

## 2018-07-10 DIAGNOSIS — Z170 Estrogen receptor positive status [ER+]: Secondary | ICD-10-CM | POA: Diagnosis not present

## 2018-07-10 MED ORDER — RADIAPLEXRX EX GEL
Freq: Once | CUTANEOUS | Status: AC
  Administered 2018-07-10: 16:00:00 via TOPICAL

## 2018-07-10 NOTE — Progress Notes (Signed)
  Echocardiogram 2D Echocardiogram has been performed.  Darlina Sicilian M 07/10/2018, 11:43 AM

## 2018-07-13 ENCOUNTER — Ambulatory Visit
Admission: RE | Admit: 2018-07-13 | Discharge: 2018-07-13 | Disposition: A | Discharge status: Home / Self Care | Payer: BLUE CROSS/BLUE SHIELD | Source: Ambulatory Visit | Attending: Radiation Oncology | Admitting: Radiation Oncology

## 2018-07-13 ENCOUNTER — Other Ambulatory Visit: Payer: Self-pay

## 2018-07-13 DIAGNOSIS — C50411 Malignant neoplasm of upper-outer quadrant of right female breast: Secondary | ICD-10-CM | POA: Diagnosis not present

## 2018-07-13 DIAGNOSIS — Z510 Encounter for antineoplastic radiation therapy: Secondary | ICD-10-CM | POA: Diagnosis not present

## 2018-07-13 DIAGNOSIS — Z170 Estrogen receptor positive status [ER+]: Secondary | ICD-10-CM | POA: Diagnosis not present

## 2018-07-14 ENCOUNTER — Ambulatory Visit
Admission: RE | Admit: 2018-07-14 | Discharge: 2018-07-14 | Disposition: A | Discharge status: Home / Self Care | Payer: BLUE CROSS/BLUE SHIELD | Source: Ambulatory Visit | Attending: Radiation Oncology | Admitting: Radiation Oncology

## 2018-07-14 ENCOUNTER — Other Ambulatory Visit: Payer: Self-pay

## 2018-07-14 DIAGNOSIS — Z170 Estrogen receptor positive status [ER+]: Secondary | ICD-10-CM | POA: Diagnosis not present

## 2018-07-14 DIAGNOSIS — Z510 Encounter for antineoplastic radiation therapy: Secondary | ICD-10-CM | POA: Diagnosis not present

## 2018-07-14 DIAGNOSIS — C50411 Malignant neoplasm of upper-outer quadrant of right female breast: Secondary | ICD-10-CM | POA: Diagnosis not present

## 2018-07-15 ENCOUNTER — Other Ambulatory Visit: Payer: Self-pay

## 2018-07-15 ENCOUNTER — Ambulatory Visit
Admission: RE | Admit: 2018-07-15 | Discharge: 2018-07-15 | Disposition: A | Discharge status: Home / Self Care | Payer: BLUE CROSS/BLUE SHIELD | Source: Ambulatory Visit | Attending: Radiation Oncology | Admitting: Radiation Oncology

## 2018-07-15 ENCOUNTER — Telehealth: Payer: Self-pay

## 2018-07-15 ENCOUNTER — Encounter: Payer: Self-pay | Admitting: Hematology and Oncology

## 2018-07-15 ENCOUNTER — Encounter: Payer: Self-pay | Admitting: Radiation Oncology

## 2018-07-15 DIAGNOSIS — C50411 Malignant neoplasm of upper-outer quadrant of right female breast: Secondary | ICD-10-CM | POA: Diagnosis not present

## 2018-07-15 DIAGNOSIS — Z170 Estrogen receptor positive status [ER+]: Secondary | ICD-10-CM | POA: Diagnosis not present

## 2018-07-15 DIAGNOSIS — Z510 Encounter for antineoplastic radiation therapy: Secondary | ICD-10-CM | POA: Diagnosis not present

## 2018-07-15 NOTE — Telephone Encounter (Signed)
Called and given below message. She verbalized understanding. 

## 2018-07-15 NOTE — Telephone Encounter (Signed)
-----   Message from Gardenia Phlegm, NP sent at 07/15/2018  3:17 PM EDT ----- Heart function is normal, please notify patient ----- Message ----- From: Interface, [employer] Sent: 07/10/2018  12:04 PM EDT To: Gardenia Phlegm, NP

## 2018-07-16 ENCOUNTER — Telehealth: Payer: Self-pay | Admitting: Hematology and Oncology

## 2018-07-16 DIAGNOSIS — F4323 Adjustment disorder with mixed anxiety and depressed mood: Secondary | ICD-10-CM | POA: Diagnosis not present

## 2018-07-16 NOTE — Telephone Encounter (Signed)
Scheduled appt per 4/15 sch message - pt is aware of appt date and time   

## 2018-07-19 ENCOUNTER — Other Ambulatory Visit (HOSPITAL_COMMUNITY): Payer: Self-pay | Admitting: Psychiatry

## 2018-07-19 DIAGNOSIS — F411 Generalized anxiety disorder: Secondary | ICD-10-CM

## 2018-07-19 DIAGNOSIS — F330 Major depressive disorder, recurrent, mild: Secondary | ICD-10-CM

## 2018-07-20 ENCOUNTER — Other Ambulatory Visit: Payer: Self-pay

## 2018-07-20 ENCOUNTER — Ambulatory Visit (INDEPENDENT_AMBULATORY_CARE_PROVIDER_SITE_OTHER): Payer: BLUE CROSS/BLUE SHIELD | Admitting: Psychiatry

## 2018-07-20 DIAGNOSIS — F330 Major depressive disorder, recurrent, mild: Secondary | ICD-10-CM

## 2018-07-20 DIAGNOSIS — F411 Generalized anxiety disorder: Secondary | ICD-10-CM

## 2018-07-20 MED ORDER — ARIPIPRAZOLE 2 MG PO TABS: 2.0000 mg | ORAL_TABLET | Freq: Every day | ORAL | 1 refills | Status: DC

## 2018-07-20 MED ORDER — VENLAFAXINE HCL ER 150 MG PO CP24: 150.0000 mg | ORAL_CAPSULE | Freq: Every day | ORAL | 0 refills | Status: DC

## 2018-07-20 MED ORDER — TRAZODONE HCL 50 MG PO TABS: ORAL_TABLET | ORAL | 1 refills | Status: DC

## 2018-07-20 NOTE — Progress Notes (Signed)
Virtual Visit via Telephone Note  I connected with Sandra [name] on 07/20/18 at  8:40 AM EDT by telephone and verified that I am speaking with the correct person using two identifiers.   I discussed the limitations, risks, security and privacy concerns of performing an evaluation and management service by telephone and the availability of in person appointments. I also discussed with the patient that there may be a patient responsible charge related to this service. The patient expressed understanding and agreed to proceed.   History of Present Illness: Patient was evaluated through phone session.  On her last visit we increase Effexor.  She is taking Effexor 150 mg daily.  We also added low-dose trazodone.  She endorsed in the beginning half tablet did not work but then she moved to full tablet she is sleeping good.  She did not see huge improvement with increase Effexor but she started therapy.  She cut down her intake of drinking Coke.  She admitted her crying spells are less intense and less frequent.  Recently she visited her oncologist for her breast cancer and getting now radiation.  Patient had a good support system.  She lives with her husband and 44 year old daughter who sees Lutricia Horsfall for therapy.  Patient denies any paranoia, hallucination or any suicidal thoughts.  She denies any anger or mood swings.  She still have sadness and lack of energy.  Due to pandemic coronavirus she is taking extra precaution as given her history of breast cancer she does not leave the house unnecessary.  Patient denies drinking alcohol or using any illegal substances.  She reported her weight and appetite is unchanged from the past.  Past Psychiatric History: Reviewed. [facility]  Tried Paxil, Lexapro but don't remember.  Lamictal did not help and Wellbutrin caused depression.  No h/o psychosis, suicidal attempt or  Inpatient.   Recent Results (from the past 2160 hour(s))  CMP  ([place] only)     Status: Abnormal   Collection Time: 05/07/18  8:18 AM  Result Value Ref Range   Sodium 140 135 - 145 mmol/L   Potassium 3.1 (L) 3.5 - 5.1 mmol/L   Chloride 104 98 - 111 mmol/L   CO2 25 22 - 32 mmol/L   Glucose, Bld 124 (H) 70 - 99 mg/dL   BUN 12 6 - 20 mg/dL   Creatinine 0.82 0.44 - 1.00 mg/dL   Calcium 9.3 8.9 - 10.3 mg/dL   Total Protein 6.8 6.5 - 8.1 g/dL   Albumin 3.6 3.5 - 5.0 g/dL   AST 41 15 - 41 U/L   ALT 50 (H) 0 - 44 U/L   Alkaline Phosphatase 62 38 - 126 U/L   Total Bilirubin 0.3 0.3 - 1.2 mg/dL   GFR, Est Non Af Am >60 >60 mL/min   GFR, Est AFR Am >60 >60 mL/min   Anion gap 11 5 - 15    Comment: Performed at [facility], [address], [place], [place] [address]  CBC with Differential ([place] Only)     Status: Abnormal   Collection Time: 05/07/18  8:18 AM  Result Value Ref Range   WBC Count 3.9 (L) 4.0 - 10.5 K/uL   RBC 3.29 (L) 3.87 - 5.11 MIL/uL   Hemoglobin 10.8 (L) 12.0 - 15.0 g/dL   HCT 32.9 (L) 36.0 - 46.0 %   MCV 100.0 80.0 - 100.0 fL   MCH 32.8 26.0 - 34.0 pg  MCHC 32.8 30.0 - 36.0 g/dL   RDW 13.3 11.5 - 15.5 %   Platelet Count 226 150 - 400 K/uL   nRBC 0.0 0.0 - 0.2 %   Neutrophils Relative % 52 %   Neutro Abs 2.1 1.7 - 7.7 K/uL   Lymphocytes Relative 35 %   Lymphs Abs 1.4 0.7 - 4.0 K/uL   Monocytes Relative 10 %   Monocytes Absolute 0.4 0.1 - 1.0 K/uL   Eosinophils Relative 3 %   Eosinophils Absolute 0.1 0.0 - 0.5 K/uL   Basophils Relative 0 %   Basophils Absolute 0.0 0.0 - 0.1 K/uL   Immature Granulocytes 0 %   Abs Immature Granulocytes 0.00 0.00 - 0.07 K/uL    Comment: Performed at [facility], [place] [address] [place], [place] [address]  CMP ([place] only)     Status: Abnormal   Collection Time: 05/28/18  8:12 AM  Result Value Ref Range   Sodium 141 135 - 145 mmol/L   Potassium 3.2 (L) 3.5 - 5.1 mmol/L   Chloride 105 98 - 111 mmol/L   CO2 26 22 - 32  mmol/L   Glucose, Bld 88 70 - 99 mg/dL   BUN 9 6 - 20 mg/dL   Creatinine 0.82 0.44 - 1.00 mg/dL   Calcium 9.1 8.9 - 10.3 mg/dL   Total Protein 6.9 6.5 - 8.1 g/dL   Albumin 3.5 3.5 - 5.0 g/dL   AST 46 (H) 15 - 41 U/L   ALT 50 (H) 0 - 44 U/L   Alkaline Phosphatase 70 38 - 126 U/L   Total Bilirubin 0.3 0.3 - 1.2 mg/dL   GFR, Est Non Af Am >60 >60 mL/min   GFR, Est AFR Am >60 >60 mL/min   Anion gap 10 5 - 15    Comment: Performed at [facility], [address] [place], [place] [address]  CBC with Differential ([place] Only)     Status: Abnormal   Collection Time: 05/28/18  8:12 AM  Result Value Ref Range   WBC Count 2.6 (L) 4.0 - 10.5 K/uL   RBC 3.35 (L) 3.87 - 5.11 MIL/uL   Hemoglobin 10.8 (L) 12.0 - 15.0 g/dL   HCT 32.4 (L) 36.0 - 46.0 %   MCV 96.7 80.0 - 100.0 fL   MCH 32.2 26.0 - 34.0 pg   MCHC 33.3 30.0 - 36.0 g/dL   RDW 12.5 11.5 - 15.5 %   Platelet Count 157 150 - 400 K/uL   nRBC 0.0 0.0 - 0.2 %   Neutrophils Relative % 44 %   Neutro Abs 1.1 (L) 1.7 - 7.7 K/uL   Lymphocytes Relative 39 %   Lymphs Abs 1.0 0.7 - 4.0 K/uL   Monocytes Relative 13 %   Monocytes Absolute 0.3 0.1 - 1.0 K/uL   Eosinophils Relative 4 %   Eosinophils Absolute 0.1 0.0 - 0.5 K/uL   Basophils Relative 0 %   Basophils Absolute 0.0 0.0 - 0.1 K/uL   Immature Granulocytes 0 %   Abs Immature Granulocytes 0.00 0.00 - 0.07 K/uL    Comment: Performed at [facility], [place] [address], [place], [place] [address]  [place] ([place] only)     Status: None   Collection Time: 06/18/18  7:52 AM  Result Value Ref Range   Sodium 139 135 - 145 mmol/L   Potassium 3.7 3.5 - 5.1 mmol/L   Chloride 102 98 - 111  mmol/L   CO2 27 22 - 32 mmol/L   Glucose, Bld 87 70 - 99 mg/dL   BUN 8 6 - 20 mg/dL   Creatinine 0.88 0.44 - 1.00 mg/dL   Calcium 9.4 8.9 - 10.3 mg/dL   Total Protein 7.7 6.5 - 8.1 g/dL   Albumin 3.7 3.5 - 5.0 g/dL   AST 38 15 - 41 U/L   ALT 44 0  - 44 U/L   Alkaline Phosphatase 78 38 - 126 U/L   Total Bilirubin 0.3 0.3 - 1.2 mg/dL   GFR, Est Non Af Am >60 >60 mL/min   GFR, Est AFR Am >60 >60 mL/min   Anion gap 10 5 - 15    Comment: Performed at [facility], [place] [address] [place], [place] [address]  CBC with Differential ([place] Only)     Status: Abnormal   Collection Time: 06/18/18  7:52 AM  Result Value Ref Range   WBC Count 3.2 (L) 4.0 - 10.5 K/uL   RBC 3.71 (L) 3.87 - 5.11 MIL/uL   Hemoglobin 11.5 (L) 12.0 - 15.0 g/dL   HCT 35.4 (L) 36.0 - 46.0 %   MCV 95.4 80.0 - 100.0 fL   MCH 31.0 26.0 - 34.0 pg   MCHC 32.5 30.0 - 36.0 g/dL   RDW 12.3 11.5 - 15.5 %   Platelet Count 173 150 - 400 K/uL   nRBC 0.0 0.0 - 0.2 %   Neutrophils Relative % 52 %   Neutro Abs 1.7 1.7 - 7.7 K/uL   Lymphocytes Relative 33 %   Lymphs Abs 1.1 0.7 - 4.0 K/uL   Monocytes Relative 11 %   Monocytes Absolute 0.4 0.1 - 1.0 K/uL   Eosinophils Relative 3 %   Eosinophils Absolute 0.1 0.0 - 0.5 K/uL   Basophils Relative 1 %   Basophils Absolute 0.0 0.0 - 0.1 K/uL   Immature Granulocytes 0 %   Abs Immature Granulocytes 0.01 0.00 - 0.07 K/uL    Comment: Performed at [facility], [place] [address], [place], [place] [address]  CBC with Differential ([place] Only)     Status: Abnormal   Collection Time: 07/09/18  9:24 AM  Result Value Ref Range   WBC Count 3.0 (L) 4.0 - 10.5 K/uL   RBC 3.58 (L) 3.87 - 5.11 MIL/uL   Hemoglobin 10.9 (L) 12.0 - 15.0 g/dL   HCT 33.5 (L) 36.0 - 46.0 %   MCV 93.6 80.0 - 100.0 fL   MCH 30.4 26.0 - 34.0 pg   MCHC 32.5 30.0 - 36.0 g/dL   RDW 12.4 11.5 - 15.5 %   Platelet Count 151 150 - 400 K/uL   nRBC 0.0 0.0 - 0.2 %   Neutrophils Relative % 47 %   Neutro Abs 1.4 (L) 1.7 - 7.7 K/uL   Lymphocytes Relative 33 %   Lymphs Abs 1.0 0.7 - 4.0 K/uL   Monocytes Relative 15 %   Monocytes Absolute 0.5 0.1 - 1.0 K/uL   Eosinophils Relative 5 %   Eosinophils Absolute 0.2  0.0 - 0.5 K/uL   Basophils Relative 0 %   Basophils Absolute 0.0 0.0 - 0.1 K/uL   Immature Granulocytes 0 %   Abs Immature Granulocytes 0.01 0.00 - 0.07 K/uL    Comment: Performed at [facility], [place] [address], [place], [place] [address]  CMP ([place] only)     Status: Abnormal   Collection Time:  07/09/18  9:24 AM  Result Value Ref Range   Sodium 140 135 - 145 mmol/L   Potassium 3.4 (L) 3.5 - 5.1 mmol/L   Chloride 104 98 - 111 mmol/L   CO2 26 22 - 32 mmol/L   Glucose, Bld 108 (H) 70 - 99 mg/dL   BUN 11 6 - 20 mg/dL   Creatinine 0.91 0.44 - 1.00 mg/dL   Calcium 8.8 (L) 8.9 - 10.3 mg/dL   Total Protein 7.2 6.5 - 8.1 g/dL   Albumin 3.5 3.5 - 5.0 g/dL   AST 32 15 - 41 U/L   ALT 43 0 - 44 U/L   Alkaline Phosphatase 74 38 - 126 U/L   Total Bilirubin 0.3 0.3 - 1.2 mg/dL   GFR, Est Non Af Am >60 >60 mL/min   GFR, Est AFR Am >60 >60 mL/min   Anion gap 10 5 - 15    Comment: Performed at [facility], [address], [place], [place] [address]     Observations/Objective: Brief mental status examination done on the phone.  Patient described her mood okay.  Her speech is clear and coherent.  Her thought process logical, coherent and goal-directed.  There were no flight of ideas or loose association.  Her attention concentration is fair.  She denies any auditory or visual hallucination, active or passive suicidal thoughts or homicidal thought.  There were no delusions, paranoia present.  She is alert and oriented x3.  There were no grandiosity.  Her fund of knowledge is good.  Her cognition is intact.  Her insight judgment is okay.  Assessment and Plan: Major depressive disorder, recurrent.  Generalized anxiety disorder.  I reviewed blood work results.  She has low WBC count.  We are still awaiting GeneSight [facility] the [facility].  She started therapy with Joanna Puff.  She still struggle with depression and anxiety.  I  recommend to try low-dose Abilify 2 mg to help the depression anxiety symptoms.  Discussed medication side effect specially metabolic syndrome, tremors and EPS.  Encouraged to continue therapy.  Discussed current pandemic coronavirus and importance of social distancing and not leaving the house and necessary due to low WBC count.  I recommend to call us back if she has any question or any concern.  Once again emphasized decrease Coca-Cola intake to help insomnia.  I will continue trazodone 50 mg at bedtime, Effexor XR 150 mg daily and start Abilify 2 mg daily.  Discussed safety concern that anytime having suicidal thoughts or homicidal thought that she need to call 911 of the local emergency room.  Follow-up in 6 weeks.  Follow Up Instructions:    I discussed the assessment and treatment plan with the patient. The patient was provided an opportunity to ask questions and all were answered. The patient agreed with the plan and demonstrated an understanding of the instructions.   The patient was advised to call back or seek an in-person evaluation if the symptoms worsen or if the condition fails to improve as anticipated.  I provided 30 minutes of non-face-to-face time during this encounter.   Kathlee Nations, MD

## 2018-07-23 ENCOUNTER — Other Ambulatory Visit: Payer: Self-pay | Admitting: Hematology and Oncology

## 2018-07-23 ENCOUNTER — Encounter: Payer: Self-pay | Admitting: *Deleted

## 2018-07-28 ENCOUNTER — Encounter: Payer: Self-pay | Admitting: Pharmacist

## 2018-07-30 ENCOUNTER — Inpatient Hospital Stay: Payer: BLUE CROSS/BLUE SHIELD

## 2018-07-30 ENCOUNTER — Other Ambulatory Visit: Payer: Self-pay

## 2018-07-30 VITALS — BP 116/82 | HR 85 | Temp 98.9°F | Resp 18

## 2018-07-30 DIAGNOSIS — C50411 Malignant neoplasm of upper-outer quadrant of right female breast: Secondary | ICD-10-CM | POA: Diagnosis not present

## 2018-07-30 DIAGNOSIS — Z170 Estrogen receptor positive status [ER+]: Secondary | ICD-10-CM

## 2018-07-30 DIAGNOSIS — Z5112 Encounter for antineoplastic immunotherapy: Secondary | ICD-10-CM | POA: Diagnosis not present

## 2018-07-30 DIAGNOSIS — Z95828 Presence of other vascular implants and grafts: Secondary | ICD-10-CM

## 2018-07-30 LAB — CMP (CANCER CENTER ONLY)
ALT: 43 U/L (ref 0–44)
AST: 39 U/L (ref 15–41)
Albumin: 3.5 g/dL (ref 3.5–5.0)
Alkaline Phosphatase: 82 U/L (ref 38–126)
Anion gap: 11 (ref 5–15)
BUN: 14 mg/dL (ref 6–20)
CO2: 27 mmol/L (ref 22–32)
Calcium: 9.3 mg/dL (ref 8.9–10.3)
Chloride: 102 mmol/L (ref 98–111)
Creatinine: 0.96 mg/dL (ref 0.44–1.00)
GFR, Est AFR Am: >60 mL/min (ref >60)
GFR, Estimated: >60 mL/min (ref >60)
Glucose, Bld: 85 mg/dL (ref 70–99)
Potassium: 3.9 mmol/L (ref 3.5–5.1)
Sodium: 140 mmol/L (ref 135–145)
Total Bilirubin: 0.3 mg/dL (ref 0.3–1.2)
Total Protein: 7.5 g/dL (ref 6.5–8.1)

## 2018-07-30 LAB — CBC WITH DIFFERENTIAL (CANCER CENTER ONLY)
Abs Immature Granulocytes: 0.01 10*3/uL (ref 0.00–0.07)
Basophils Absolute: 0 10*3/uL (ref 0.0–0.1)
Basophils Relative: 1 %
Eosinophils Absolute: 0.2 10*3/uL (ref 0.0–0.5)
Eosinophils Relative: 6 %
HCT: 34.2 % — ABNORMAL LOW (ref 36.0–46.0)
Hemoglobin: 11.3 g/dL — ABNORMAL LOW (ref 12.0–15.0)
Immature Granulocytes: 0 %
Lymphocytes Relative: 25 %
Lymphs Abs: 1 10*3/uL (ref 0.7–4.0)
MCH: 29.7 pg (ref 26.0–34.0)
MCHC: 33 g/dL (ref 30.0–36.0)
MCV: 90 fL (ref 80.0–100.0)
Monocytes Absolute: 0.5 10*3/uL (ref 0.1–1.0)
Monocytes Relative: 13 %
Neutro Abs: 2.2 10*3/uL (ref 1.7–7.7)
Neutrophils Relative %: 55 %
Platelet Count: 168 10*3/uL (ref 150–400)
RBC: 3.8 MIL/uL — ABNORMAL LOW (ref 3.87–5.11)
RDW: 12.9 % (ref 11.5–15.5)
WBC Count: 3.9 10*3/uL — ABNORMAL LOW (ref 4.0–10.5)
nRBC: 0 % (ref 0.0–0.2)

## 2018-07-30 MED ORDER — SODIUM CHLORIDE 0.9 % IV SOLN
Freq: Once | INTRAVENOUS | Status: AC
  Administered 2018-07-30: 09:00:00 via INTRAVENOUS
  Filled 2018-07-30: qty 250

## 2018-07-30 MED ORDER — ACETAMINOPHEN 325 MG PO TABS: 650.0000 mg | ORAL_TABLET | Freq: Once | ORAL | Status: DC

## 2018-07-30 MED ORDER — DIPHENHYDRAMINE HCL 25 MG PO CAPS: 50.0000 mg | ORAL_CAPSULE | Freq: Once | ORAL | Status: DC

## 2018-07-30 MED ORDER — SODIUM CHLORIDE 0.9 % IV SOLN
3.6000 mg/kg | Freq: Once | INTRAVENOUS | Status: AC
  Administered 2018-07-30: 10:00:00 300 mg via INTRAVENOUS
  Filled 2018-07-30: qty 15

## 2018-07-30 MED ORDER — HEPARIN SOD (PORK) LOCK FLUSH 100 UNIT/ML IV SOLN
500.0000 [IU] | Freq: Once | INTRAVENOUS | Status: AC | PRN
  Administered 2018-07-30: 500 [IU]
  Filled 2018-07-30: qty 5

## 2018-07-30 MED ORDER — SODIUM CHLORIDE 0.9% FLUSH
10.0000 mL | INTRAVENOUS | Status: DC | PRN
  Administered 2018-07-30: 10 mL
  Filled 2018-07-30: qty 10

## 2018-07-30 MED ORDER — SODIUM CHLORIDE 0.9% FLUSH
10.0000 mL | INTRAVENOUS | Status: DC | PRN
  Administered 2018-07-30: 08:00:00 10 mL
  Filled 2018-07-30: qty 10

## 2018-07-30 NOTE — Patient Instructions (Signed)
[facility] Discharge Instructions for Patients Receiving Chemotherapy  Today you received the following chemotherapy agents Kadcyla  To help prevent nausea and vomiting after your treatment, we encourage you to take your nausea medication as directed   If you develop nausea and vomiting that is not controlled by your nausea medication, call the clinic.   BELOW ARE SYMPTOMS THAT SHOULD BE REPORTED IMMEDIATELY:  *FEVER GREATER THAN 100.5 F  *CHILLS WITH OR WITHOUT FEVER  NAUSEA AND VOMITING THAT IS NOT CONTROLLED WITH YOUR NAUSEA MEDICATION  *UNUSUAL SHORTNESS OF BREATH  *UNUSUAL BRUISING OR BLEEDING  TENDERNESS IN MOUTH AND THROAT WITH OR WITHOUT PRESENCE OF ULCERS  *URINARY PROBLEMS  *BOWEL PROBLEMS  UNUSUAL RASH Items with * indicate a potential emergency and should be followed up as soon as possible.  Feel free to call the clinic should you have any questions or concerns. The clinic phone number is [phone].  Please show the CHEMO ALERT CARD at check-in to the Emergency Department and triage nurse.   

## 2018-08-06 ENCOUNTER — Telehealth: Payer: Self-pay | Admitting: Radiation Oncology

## 2018-08-06 DIAGNOSIS — F4323 Adjustment disorder with mixed anxiety and depressed mood: Secondary | ICD-10-CM | POA: Diagnosis not present

## 2018-08-06 NOTE — Telephone Encounter (Signed)
  Radiation Oncology         [phone]) [phone] ________________________________  Name: Sandra [name] MRN: [id number]  Date of Service: 08/06/2018  DOB: 1966/03/20  Post Treatment Telephone Note  Diagnosis:  Stage IA, cT1cN0M0 grade 3 triple positive, invasive ductal carcinoma of the right breast.   Interval Since Last Radiation:  3 weeks   06/01/2018-07/15/2018:  The right breast was treated to 50.4 Gy in 28 fractions. The lumpectomy cavity was boosted to 10 Gy over 5 fractions to total 60.4 Gy  Narrative:  The patient was contacted today for routine follow-up. During treatment she did very well with radiotherapy and did not have significant desquamation. She reports she is doing well and her skin is starting to recover nicely. She's used aloe vera as needed since treatment ended. No other complaints are noted and she's to proceed with Kadcyla in the next two weeks.  Impression/Plan: 1. Stage IA, cT1cN0M0 grade 3 triple positive, invasive ductal carcinoma of the right breast. The patient has been doing well since completion of radiotherapy. We discussed that we would be happy to continue to follow her as needed, but she will also continue to follow up with Dr. Lindi Adie in medical oncology. She was counseled on skin care as well as measures to avoid sun exposure to this area.  2. Survivorship. We discussed the importance of survivorship evaluation and will be scheduled for this visit per Dr. Lindi Adie.     Carola Rhine, PAC

## 2018-08-13 NOTE — Assessment & Plan Note (Addendum)
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  Treatment plan: 1.Neoadjuvant chemotherapy with [place] [facility] 12/04/2017-12/26/2019followed by Kadcyla x 1 year 2.breast conserving surgery with sentinel lymph node biopsy: 04/23/2018 3.Radiation therapy:to finish up on 07/15/2018 4.Adjuvant antiestrogen therapy -------------------------------------------------------------------------------------------------------------------------------- 04/23/2018:Right lumpectomy: Small residual invasive cancer multifocal 0.15 cm, grade 2, 0/3 lymph nodes all negative, ER 90%, PR 60%, HER-2 +3+ by IHC, Ki-67 20%, RCB class I, ympT1a, ypN0  Echo 03/11/2018: EF 60-65%  [place]: she is tolerating this well.  Her labs are stable, we will continue to monitor.   She will return every three weeks for Kadcyla with follow up with myself every 6 weeks.

## 2018-08-19 NOTE — Progress Notes (Signed)
Patient Care Team: Briscoe Deutscher, DO as PCP - General (Family Medicine) Macario Carls, MD as Referring Physician (Specialist) Erroll Luna, MD as Consulting Physician (General Surgery) Nicholas Lose, MD as Consulting Physician (Hematology and Oncology) Kyung Rudd, MD as Consulting Physician (Radiation Oncology)  DIAGNOSIS:    ICD-10-CM   1. Malignant neoplasm of upper-outer quadrant of right breast in female, estrogen receptor positive ([place]) C50.411    Z17.0     SUMMARY OF ONCOLOGIC HISTORY:   Malignant neoplasm of upper-outer quadrant of right breast in female, estrogen receptor positive ([place])   11/11/2017 Initial Diagnosis    Palpable right breast mass at 10 o'clock position 1.7 cm, axilla negative, biopsy revealed grade 3 IDC ER 60%, PR 10%, Ki-67 70%, HER-2 positive, T1 CN 0 stage Ia    11/19/2017 Cancer Staging    Staging form: Breast, AJCC 8th Edition - Clinical: Stage IB (cT2, cN0, cM0, G3, ER+, PR+, HER2+) - Signed by Nicholas Lose, MD on 12/04/2017    11/25/2017 Breast MRI    4.4 x 2.8 x 2.1 cm right breast malignancy UOQ, second focus 0.8 cm (biopsy planned for 12/09/2017)    12/04/2017 - 03/26/2018 Neo-Adjuvant Chemotherapy    TCH Perjeta every 3 week x6 followed by Herceptin Perjeta maintenance    01/04/2018 Genetic Testing    Negative genetic testing on the multi-cancer panel.  The Multi-Gene Panel offered by [facility] includes sequencing and/or deletion duplication testing of the following 84 genes: AIP, ALK, APC, ATM, AXIN2,BAP1,  BARD1, BLM, BMPR1A, BRCA1, BRCA2, BRIP1, CASR, CDC73, CDH1, CDK4, CDKN1B, CDKN1C, CDKN2A (p14ARF), CDKN2A (p16INK4a), CEBPA, CHEK2, CTNNA1, DICER1, DIS3L2, EGFR (c.2369C>T, p.Thr790Met variant only), EPCAM (Deletion/duplication testing only), FH, FLCN, GATA2, GPC3, GREM1 (Promoter region deletion/duplication testing only), HOXB13 (c.251G>A, p.Gly84Glu), HRAS, KIT, MAX, MEN1, MET, MITF (c.952G>A, p.Glu318Lys variant only), MLH1, MSH2, MSH3, MSH6,  MUTYH, NBN, NF1, NF2, NTHL1, PALB2, PDGFRA, PHOX2B, PMS2, POLD1, POLE, POT1, PRKAR1A, PTCH1, PTEN, RAD50, RAD51C, RAD51D, RB1, RECQL4, RET, RUNX1, SDHAF2, SDHA (sequence changes only), SDHB, SDHC, SDHD, SMAD4, SMARCA4, SMARCB1, SMARCE1, STK11, SUFU, TERC, TERT, TMEM127, TP53, TSC1, TSC2, VHL, WRN and WT1.  The report date is January 04, 2018.    04/23/2018 Surgery    Right lumpectomy: Small residual invasive cancer multifocal 0.15 cm, grade 2, 0/3 lymph nodes all negative, ER 90%, PR 60%, HER-2 +3+ by IHC, Ki-67 20%, RCB class I, ympT1a, ypN0    05/07/2018 -  Chemotherapy    The patient had ado-trastuzumab emtansine (KADCYLA) 300 mg in sodium chloride 0.9 % 250 mL chemo infusion, 3.6 mg/kg = 300 mg, Intravenous, Once, 5 of 11 cycles Administration: 300 mg (05/07/2018), 300 mg (05/28/2018), 300 mg (07/30/2018), 300 mg (06/18/2018), 300 mg (07/09/2018)  for chemotherapy treatment.      CHIEF COMPLIANT: Follow-up of Kadcyla maintenance  INTERVAL HISTORY: Sandra [name] is a 53 y.o. with above-mentioned history of HER2 positive right breast cancer treated with neoadjuvant chemotherapy with TCHP followed by a right lumpectomy and is currently on Kadycla maintenance every 3 weeks. ECHO on 07/10/18 showed an ejection fraction in the range of 55-60%. She presents to the clinic alone today for treatment.   REVIEW OF SYSTEMS:   Constitutional: Denies fevers, chills or abnormal weight loss Eyes: Denies blurriness of vision Ears, nose, mouth, throat, and face: Denies mucositis or sore throat Respiratory: Denies cough, dyspnea or wheezes Cardiovascular: Denies palpitation, chest discomfort Gastrointestinal: Denies nausea, heartburn or change in bowel habits Skin: Denies abnormal skin rashes Lymphatics: Denies new lymphadenopathy or  easy bruising Neurological: Denies numbness, tingling or new weaknesses Behavioral/Psych: Mood is stable, no new changes  Extremities: No lower extremity edema Breast: denies any pain  or lumps or nodules in either breasts All other systems were reviewed with the patient and are negative.  I have reviewed the past medical history, past surgical history, social history and family history with the patient and they are unchanged from previous note.  ALLERGIES:  has No Known Allergies.  MEDICATIONS:  Current Outpatient Medications  Medication Sig Dispense Refill   ARIPiprazole (ABILIFY) 2 MG tablet Take 1 tablet (2 mg total) by mouth daily. 30 tablet 1   betamethasone valerate ointment (VALISONE) 0.1 % Apply bid for 1-2 weeks as needed for flare of lichen sclerosis. 15 g 1   Biotin 10 MG CAPS Take by mouth.     ibuprofen (ADVIL,MOTRIN) 800 MG tablet Take 1 tablet (800 mg total) by mouth every 8 (eight) hours as needed. 30 tablet 0   lidocaine-prilocaine (EMLA) cream Apply 1 application topically daily as needed (port use).   5   LORazepam (ATIVAN) 1 MG tablet Take 1 tablet (1 mg total) by mouth every 8 (eight) hours as needed for anxiety (or nausea). (Patient not taking: Reported on 07/09/2018) 30 tablet 3   ondansetron (ZOFRAN) 8 MG tablet TAKE 1 TABLET (8 MG TOTAL) BY MOUTH 2 (TWO) TIMES DAILY AS NEEDED (NAUSEA OR VOMITING). 30 tablet 1   pantoprazole (PROTONIX) 40 MG tablet TAKE 1 TABLET BY MOUTH EVERY DAY 30 tablet 1   prochlorperazine (COMPAZINE) 10 MG tablet Take 1 tablet (10 mg total) by mouth every 6 (six) hours as needed (Nausea or vomiting). 30 tablet 1   traZODone (DESYREL) 50 MG tablet Take 1/2 to one tab at bed time 30 tablet 1   venlafaxine XR (EFFEXOR-XR) 150 MG 24 hr capsule Take 1 capsule (150 mg total) by mouth daily with breakfast. 90 capsule 0   No current facility-administered medications for this visit.     PHYSICAL EXAMINATION: ECOG PERFORMANCE STATUS: 1 - Symptomatic but completely ambulatory  Vitals:   08/20/18 0823  BP: 112/61  Pulse: 92  Resp: 18  Temp: 98.7 F (37.1 C)  SpO2: 100%   Filed Weights   08/20/18 0823  Weight: 179  lb 11.2 oz (81.5 kg)    GENERAL: alert, no distress and comfortable SKIN: skin color, texture, turgor are normal, no rashes or significant lesions EYES: normal, Conjunctiva are pink and non-injected, sclera clear OROPHARYNX: no exudate, no erythema and lips, buccal mucosa, and tongue normal  NECK: supple, thyroid normal size, non-tender, without nodularity LYMPH: no palpable lymphadenopathy in the cervical, axillary or inguinal LUNGS: clear to auscultation and percussion with normal breathing effort HEART: regular rate & rhythm and no murmurs and no lower extremity edema ABDOMEN: abdomen soft, non-tender and normal bowel sounds MUSCULOSKELETAL: no cyanosis of digits and no clubbing  NEURO: alert & oriented x 3 with fluent speech, no focal motor/sensory deficits EXTREMITIES: No lower extremity edema  LABORATORY DATA:  I have reviewed the data as listed CMP Latest Ref Rng & Units 07/30/2018 07/09/2018 06/18/2018  Glucose 70 - 99 mg/dL 85 108(H) 87  BUN 6 - 20 mg/dL '14 11 8  ' Creatinine 0.44 - 1.00 mg/dL 0.96 0.91 0.88  Sodium 135 - 145 mmol/L 140 140 139  Potassium 3.5 - 5.1 mmol/L 3.9 3.4(L) 3.7  Chloride 98 - 111 mmol/L 102 104 102  CO2 22 - 32 mmol/L '27 26 27  ' Calcium 8.9 -  10.3 mg/dL 9.3 8.8(L) 9.4  Total Protein 6.5 - 8.1 g/dL 7.5 7.2 7.7  Total Bilirubin 0.3 - 1.2 mg/dL 0.3 0.3 0.3  Alkaline Phos 38 - 126 U/L 82 74 78  AST 15 - 41 U/L 39 32 38  ALT 0 - 44 U/L 43 43 44    Lab Results  Component Value Date   WBC 3.4 (L) 08/20/2018   HGB 11.5 (L) 08/20/2018   HCT 34.6 (L) 08/20/2018   MCV 90.1 08/20/2018   PLT 155 08/20/2018   NEUTROABS 1.6 (L) 08/20/2018    ASSESSMENT & PLAN:  Malignant neoplasm of upper-outer quadrant of right breast in female, estrogen receptor positive ([place]) 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%, PR 10%, Ki-67 70%, HER-2 positive, T1 CN 0 stage Ia Breast MRI 11/25/2017: 4.4  x 2.8 x 2.1 cm right breast malignancy UOQ, second focus 0.8 cm(biopsy plannedfor 12/09/2017), overall extent of disease is 7 cm 12/12/2017:Biopsy results of the MRI guided biopsies 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  Treatment plan: 1.Neoadjuvant chemotherapy with [place] Perjeta 12/04/2017-12/26/2019followed by Kadcyla x 1 year 2.breast conserving surgery with sentinel lymph node biopsy: 04/23/2018 3.Radiation therapy:to finish up on 07/15/2018 4.Adjuvant antiestrogen therapy -------------------------------------------------------------------------------------------------------------------------------- 04/23/2018:Right lumpectomy: Small residual invasive cancer multifocal 0.15 cm, grade 2, 0/3 lymph nodes all negative, ER 90%, PR 60%, HER-2 +3+ by IHC, Ki-67 20%, RCB class I, ympT1a, ypN0  Echo 03/11/2018: EF 60-65%  Kadycla: she is tolerating this well.  Her labs are stable, we will continue to monitor.  She feels tired for 1 to 2 days after Kadcyla but then gets better and feels normal. Dry mouth, insomnia, muscle cramps: I discussed with her that this could be the result of Abilify She will discuss this with her physician.  She will return every three weeks for Kadcyla with follow up with myself every 6 weeks.      No orders of the defined types were placed in this encounter.  The patient has a good understanding of the overall plan. she agrees with it. she will call with any problems that may develop before the next visit here.  Nicholas Lose, MD 08/20/2018  Julious Oka Dorshimer am acting as scribe for Dr. Nicholas Lose.  I have reviewed the above documentation for accuracy and completeness, and I agree with the above.

## 2018-08-20 ENCOUNTER — Inpatient Hospital Stay: Payer: BLUE CROSS/BLUE SHIELD

## 2018-08-20 ENCOUNTER — Other Ambulatory Visit: Payer: Self-pay

## 2018-08-20 ENCOUNTER — Inpatient Hospital Stay (HOSPITAL_BASED_OUTPATIENT_CLINIC_OR_DEPARTMENT_OTHER): Payer: BLUE CROSS/BLUE SHIELD | Admitting: Hematology and Oncology

## 2018-08-20 ENCOUNTER — Encounter: Payer: Self-pay | Admitting: *Deleted

## 2018-08-20 ENCOUNTER — Inpatient Hospital Stay: Payer: BLUE CROSS/BLUE SHIELD | Attending: Hematology and Oncology

## 2018-08-20 DIAGNOSIS — Z5112 Encounter for antineoplastic immunotherapy: Secondary | ICD-10-CM | POA: Insufficient documentation

## 2018-08-20 DIAGNOSIS — C50411 Malignant neoplasm of upper-outer quadrant of right female breast: Secondary | ICD-10-CM | POA: Insufficient documentation

## 2018-08-20 DIAGNOSIS — Z95828 Presence of other vascular implants and grafts: Secondary | ICD-10-CM

## 2018-08-20 DIAGNOSIS — Z170 Estrogen receptor positive status [ER+]: Secondary | ICD-10-CM | POA: Diagnosis not present

## 2018-08-20 LAB — CBC WITH DIFFERENTIAL (CANCER CENTER ONLY)
Abs Immature Granulocytes: 0.01 10*3/uL (ref 0.00–0.07)
Basophils Absolute: 0 10*3/uL (ref 0.0–0.1)
Basophils Relative: 0 %
Eosinophils Absolute: 0.2 10*3/uL (ref 0.0–0.5)
Eosinophils Relative: 7 %
HCT: 34.6 % — ABNORMAL LOW (ref 36.0–46.0)
Hemoglobin: 11.5 g/dL — ABNORMAL LOW (ref 12.0–15.0)
Immature Granulocytes: 0 %
Lymphocytes Relative: 35 %
Lymphs Abs: 1.2 10*3/uL (ref 0.7–4.0)
MCH: 29.9 pg (ref 26.0–34.0)
MCHC: 33.2 g/dL (ref 30.0–36.0)
MCV: 90.1 fL (ref 80.0–100.0)
Monocytes Absolute: 0.3 10*3/uL (ref 0.1–1.0)
Monocytes Relative: 10 %
Neutro Abs: 1.6 10*3/uL — ABNORMAL LOW (ref 1.7–7.7)
Neutrophils Relative %: 48 %
Platelet Count: 155 10*3/uL (ref 150–400)
RBC: 3.84 MIL/uL — ABNORMAL LOW (ref 3.87–5.11)
RDW: 13.5 % (ref 11.5–15.5)
WBC Count: 3.4 10*3/uL — ABNORMAL LOW (ref 4.0–10.5)
nRBC: 0 % (ref 0.0–0.2)

## 2018-08-20 LAB — CMP (CANCER CENTER ONLY)
ALT: 28 U/L (ref 0–44)
AST: 31 U/L (ref 15–41)
Albumin: 3.6 g/dL (ref 3.5–5.0)
Alkaline Phosphatase: 64 U/L (ref 38–126)
Anion gap: 9 (ref 5–15)
BUN: 15 mg/dL (ref 6–20)
CO2: 27 mmol/L (ref 22–32)
Calcium: 9.4 mg/dL (ref 8.9–10.3)
Chloride: 101 mmol/L (ref 98–111)
Creatinine: 0.92 mg/dL (ref 0.44–1.00)
GFR, Est AFR Am: >60 mL/min (ref >60)
GFR, Estimated: >60 mL/min (ref >60)
Glucose, Bld: 81 mg/dL (ref 70–99)
Potassium: 3.8 mmol/L (ref 3.5–5.1)
Sodium: 137 mmol/L (ref 135–145)
Total Bilirubin: 0.3 mg/dL (ref 0.3–1.2)
Total Protein: 7.5 g/dL (ref 6.5–8.1)

## 2018-08-20 MED ORDER — SODIUM CHLORIDE 0.9 % IV SOLN
3.6000 mg/kg | Freq: Once | INTRAVENOUS | Status: AC
  Administered 2018-08-20: 300 mg via INTRAVENOUS
  Filled 2018-08-20: qty 15

## 2018-08-20 MED ORDER — DIPHENHYDRAMINE HCL 25 MG PO CAPS: 50.0000 mg | ORAL_CAPSULE | Freq: Once | ORAL | Status: DC

## 2018-08-20 MED ORDER — SODIUM CHLORIDE 0.9% FLUSH
10.0000 mL | INTRAVENOUS | Status: DC | PRN
  Administered 2018-08-20: 08:00:00 10 mL
  Filled 2018-08-20: qty 10

## 2018-08-20 MED ORDER — ACETAMINOPHEN 325 MG PO TABS: 650.0000 mg | ORAL_TABLET | Freq: Once | ORAL | Status: DC

## 2018-08-20 MED ORDER — HEPARIN SOD (PORK) LOCK FLUSH 100 UNIT/ML IV SOLN
500.0000 [IU] | Freq: Once | INTRAVENOUS | Status: AC | PRN
  Administered 2018-08-20: 500 [IU]
  Filled 2018-08-20: qty 5

## 2018-08-20 MED ORDER — SODIUM CHLORIDE 0.9% FLUSH
10.0000 mL | INTRAVENOUS | Status: DC | PRN
  Administered 2018-08-20: 10 mL
  Filled 2018-08-20: qty 10

## 2018-08-20 MED ORDER — SODIUM CHLORIDE 0.9 % IV SOLN
Freq: Once | INTRAVENOUS | Status: AC
  Administered 2018-08-20: 09:00:00 via INTRAVENOUS
  Filled 2018-08-20: qty 250

## 2018-08-20 NOTE — Progress Notes (Signed)
Pt took own tylenol 650mg  po and benadryl 5omg po

## 2018-08-20 NOTE — Progress Notes (Signed)
  Radiation Oncology         [phone]) [phone] ________________________________  Name: Sandra [name] MRN: [id number]  Date: 07/15/2018  DOB: 06-01-1965  End of Treatment Note  Diagnosis:   53 y.o. female with Stage IA, cT1cN0M0 grade 3 triple positive, invasive ductal carcinoma of the right breast  Indication for treatment:  Curative       Radiation treatment dates:   06/01/2018 - 07/15/2018  Site/dose:   The patient initially received a dose of 50.4 Gy in 28 fractions to the right breast using whole-breast tangent fields. This was delivered using a 3-D conformal technique. The patient then received a boost to the seroma. This delivered an additional 10 Gy in 5 fractions using 6X photons with a Complex Isodose technique. The total dose was 60.4 Gy.  Narrative: The patient tolerated radiation treatment relatively well.   The patient had some expected skin irritation with diffuse erythema/hyperpigmentation as she progressed during treatment. Moist desquamation was not present at the end of treatment. She is applying Radiaplex gel to her skin. She also noted mild fatigue.  Plan: The patient has completed radiation treatment. The patient will return to radiation oncology clinic for routine followup in one month. I advised the patient to call or return sooner if they have any questions or concerns related to their recovery or treatment. ________________________________  Jodelle Gross, MD, PhD  This document serves as a record of services personally performed by Kyung Rudd, MD. It was created on his behalf by Rae Lips, a trained medical scribe. The creation of this record is based on the scribe's personal observations and the provider's statements to them. This document has been checked and approved by the attending provider.

## 2018-08-20 NOTE — Patient Instructions (Signed)
[facility] Discharge Instructions for Patients Receiving Chemotherapy  Today you received the following chemotherapy agents Kadcyla  To help prevent nausea and vomiting after your treatment, we encourage you to take your nausea medication as directed   If you develop nausea and vomiting that is not controlled by your nausea medication, call the clinic.   BELOW ARE SYMPTOMS THAT SHOULD BE REPORTED IMMEDIATELY:  *FEVER GREATER THAN 100.5 F  *CHILLS WITH OR WITHOUT FEVER  NAUSEA AND VOMITING THAT IS NOT CONTROLLED WITH YOUR NAUSEA MEDICATION  *UNUSUAL SHORTNESS OF BREATH  *UNUSUAL BRUISING OR BLEEDING  TENDERNESS IN MOUTH AND THROAT WITH OR WITHOUT PRESENCE OF ULCERS  *URINARY PROBLEMS  *BOWEL PROBLEMS  UNUSUAL RASH Items with * indicate a potential emergency and should be followed up as soon as possible.  Feel free to call the clinic should you have any questions or concerns. The clinic phone number is [phone].  Please show the CHEMO ALERT CARD at check-in to the Emergency Department and triage nurse.   

## 2018-08-21 ENCOUNTER — Telehealth: Payer: Self-pay | Admitting: Hematology and Oncology

## 2018-08-21 NOTE — Telephone Encounter (Signed)
Called regarding schedule °

## 2018-08-27 ENCOUNTER — Telehealth (HOSPITAL_COMMUNITY): Payer: Self-pay | Admitting: Psychiatry

## 2018-08-27 DIAGNOSIS — F4323 Adjustment disorder with mixed anxiety and depressed mood: Secondary | ICD-10-CM | POA: Diagnosis not present

## 2018-08-31 ENCOUNTER — Ambulatory Visit (INDEPENDENT_AMBULATORY_CARE_PROVIDER_SITE_OTHER): Payer: BLUE CROSS/BLUE SHIELD | Admitting: Psychiatry

## 2018-08-31 ENCOUNTER — Other Ambulatory Visit: Payer: Self-pay

## 2018-08-31 ENCOUNTER — Encounter (HOSPITAL_COMMUNITY): Payer: Self-pay | Admitting: Psychiatry

## 2018-08-31 DIAGNOSIS — F411 Generalized anxiety disorder: Secondary | ICD-10-CM

## 2018-08-31 DIAGNOSIS — F330 Major depressive disorder, recurrent, mild: Secondary | ICD-10-CM

## 2018-08-31 MED ORDER — ARIPIPRAZOLE 5 MG PO TABS: 5.0000 mg | ORAL_TABLET | Freq: Every day | ORAL | 1 refills | Status: DC

## 2018-08-31 MED ORDER — CLONAZEPAM 0.5 MG PO TABS: 0.5000 mg | ORAL_TABLET | Freq: Every day | ORAL | 1 refills | Status: DC

## 2018-08-31 NOTE — Progress Notes (Signed)
Virtual Visit via Telephone Note  I connected with Sandra [name] on 08/31/18 at  8:20 AM EDT by telephone and verified that I am speaking with the correct person using two identifiers.   I discussed the limitations, risks, security and privacy concerns of performing an evaluation and management service by telephone and the availability of in person appointments. I also discussed with the patient that there may be a patient responsible charge related to this service. The patient expressed understanding and agreed to proceed.   History of Present Illness: Patient was evaluated through phone session.  On her last visit we added low-dose Abilify and recommend to take trazodone full dose at bedtime.  Patient told in the beginning she felt much better she has more energy, she was able to do things but then lately she noticed her motivation, energy is go down.  She endorsed her anxiety and depression started to get come back.  She also reported leg cramps, poor sleep, dry mouth.  She even tried full dose of trazodone but that only helped for few hours of sleep.  She also endorsed some crying spells but denies any suicidal thoughts or any feeling of hopelessness or worthlessness.  She feels proud that she stopped drinking soda.  Patient has a history of breast cancer.  She lives with her husband and together they have a 58 year old daughter.  She started seeing Jake Seats for therapy.  She denies any hallucination, paranoia or any psychosis.  She reported her appetite and weight is unchanged from the past.  We did receive records from her previous psychiatrist but GeneSight testing results were not in those records.  Past Psychiatric History: Reviewed[facility] Tried Paxil, Lexapro, ativan and trazadone but don'tremember. Lamictal did not help and Wellbutrin causeddepression. No h/opsychosis, suicidal attempt or Inpatient.    Psychiatric Specialty Exam: Physical  Exam  ROS  There were no vitals taken for this visit.There is no height or weight on file to calculate BMI.  General Appearance: NA  Eye Contact:  NA  Speech:  Clear and Coherent  Volume:  Normal  Mood:  Dysphoric  Affect:  NA  Thought Process:  Goal Directed  Orientation:  Full (Time, Place, and Person)  Thought Content:  Rumination  Suicidal Thoughts:  No  Homicidal Thoughts:  No  Memory:  Immediate;   Good Recent;   Good Remote;   Good  Judgement:  Good  Insight:  Good  Psychomotor Activity:  NA  Concentration:  Concentration: Good and Attention Span: Good  Recall:  Good  Fund of Knowledge:  Good  Language:  Good  Akathisia:  NA  Handed:  Right  AIMS (if indicated):     Assets:  Communication Skills Desire for Improvement Housing Resilience Social Support  ADL's:  Intact  Cognition:  WNL  Sleep:   fair       Assessment and Plan: Major depressive disorder, recurrent.  Generalized anxiety disorder.  Discussed medication side effect specially Abilify can cause restless leg and trazodone can cause dry mouth.  I will discontinue trazodone since it is causing dry mouth and not helping her sleep.  We will try clonazepam 0.5 mg at bedtime to help her anxiety and sleep.  She like Abilify but the dose is very low.  We talked about increasing the dose and she agreed with the plan.  I reminded that Abilify sometimes does cause restlessness and tremors and if she feels that the side effects are getting  worse then she should call us immediately.  We also discussed that we did not receive GeneSight testing results and she is going to call her previous psychiatrist release GeneSight testing results to [place].  Overall she feels some improvement from the past.  I recommend to continue Effexor XR 150 mg daily and try Abilify 5 mg and clonazepam 0.5 mg at bedtime.  Encouraged to continue therapy with Joanna Puff.  Discussed medication side effects and benefits and recommended to call us  back if she has any question or any concern.  Follow-up in 2 months.  Follow Up Instructions:    I discussed the assessment and treatment plan with the patient. The patient was provided an opportunity to ask questions and all were answered. The patient agreed with the plan and demonstrated an understanding of the instructions.   The patient was advised to call back or seek an in-person evaluation if the symptoms worsen or if the condition fails to improve as anticipated.  I provided 15 minutes of non-face-to-face time during this encounter.   Kathlee Nations, MD

## 2018-09-10 ENCOUNTER — Other Ambulatory Visit: Payer: Self-pay

## 2018-09-10 ENCOUNTER — Inpatient Hospital Stay: Payer: BC Managed Care – PPO | Attending: Hematology and Oncology

## 2018-09-10 ENCOUNTER — Inpatient Hospital Stay: Payer: BC Managed Care – PPO

## 2018-09-10 VITALS — BP 128/69 | HR 74 | Temp 99.4°F | Resp 20 | Wt 181.5 lb

## 2018-09-10 DIAGNOSIS — Z170 Estrogen receptor positive status [ER+]: Secondary | ICD-10-CM

## 2018-09-10 DIAGNOSIS — Z5112 Encounter for antineoplastic immunotherapy: Secondary | ICD-10-CM | POA: Insufficient documentation

## 2018-09-10 DIAGNOSIS — C50411 Malignant neoplasm of upper-outer quadrant of right female breast: Secondary | ICD-10-CM | POA: Insufficient documentation

## 2018-09-10 DIAGNOSIS — Z95828 Presence of other vascular implants and grafts: Secondary | ICD-10-CM

## 2018-09-10 LAB — CBC WITH DIFFERENTIAL (CANCER CENTER ONLY)
Abs Immature Granulocytes: 0 10*3/uL (ref 0.00–0.07)
Basophils Absolute: 0 10*3/uL (ref 0.0–0.1)
Basophils Relative: 0 %
Eosinophils Absolute: 0.2 10*3/uL (ref 0.0–0.5)
Eosinophils Relative: 5 %
HCT: 32.6 % — ABNORMAL LOW (ref 36.0–46.0)
Hemoglobin: 10.9 g/dL — ABNORMAL LOW (ref 12.0–15.0)
Immature Granulocytes: 0 %
Lymphocytes Relative: 33 %
Lymphs Abs: 1.2 10*3/uL (ref 0.7–4.0)
MCH: 29.9 pg (ref 26.0–34.0)
MCHC: 33.4 g/dL (ref 30.0–36.0)
MCV: 89.3 fL (ref 80.0–100.0)
Monocytes Absolute: 0.4 10*3/uL (ref 0.1–1.0)
Monocytes Relative: 10 %
Neutro Abs: 1.8 10*3/uL (ref 1.7–7.7)
Neutrophils Relative %: 52 %
Platelet Count: 135 10*3/uL — ABNORMAL LOW (ref 150–400)
RBC: 3.65 MIL/uL — ABNORMAL LOW (ref 3.87–5.11)
RDW: 13.9 % (ref 11.5–15.5)
WBC Count: 3.5 10*3/uL — ABNORMAL LOW (ref 4.0–10.5)
nRBC: 0 % (ref 0.0–0.2)

## 2018-09-10 LAB — CMP (CANCER CENTER ONLY)
ALT: 31 U/L (ref 0–44)
AST: 31 U/L (ref 15–41)
Albumin: 3.5 g/dL (ref 3.5–5.0)
Alkaline Phosphatase: 68 U/L (ref 38–126)
Anion gap: 11 (ref 5–15)
BUN: 14 mg/dL (ref 6–20)
CO2: 25 mmol/L (ref 22–32)
Calcium: 9.2 mg/dL (ref 8.9–10.3)
Chloride: 103 mmol/L (ref 98–111)
Creatinine: 0.93 mg/dL (ref 0.44–1.00)
GFR, Est AFR Am: >60 mL/min (ref >60)
GFR, Estimated: >60 mL/min (ref >60)
Glucose, Bld: 81 mg/dL (ref 70–99)
Potassium: 3.8 mmol/L (ref 3.5–5.1)
Sodium: 139 mmol/L (ref 135–145)
Total Bilirubin: 0.3 mg/dL (ref 0.3–1.2)
Total Protein: 7.3 g/dL (ref 6.5–8.1)

## 2018-09-10 MED ORDER — SODIUM CHLORIDE 0.9 % IV SOLN
3.6000 mg/kg | Freq: Once | INTRAVENOUS | Status: AC
  Administered 2018-09-10: 300 mg via INTRAVENOUS
  Filled 2018-09-10: qty 15

## 2018-09-10 MED ORDER — HEPARIN SOD (PORK) LOCK FLUSH 100 UNIT/ML IV SOLN
500.0000 [IU] | Freq: Once | INTRAVENOUS | Status: AC | PRN
  Administered 2018-09-10: 500 [IU]
  Filled 2018-09-10: qty 5

## 2018-09-10 MED ORDER — ACETAMINOPHEN 325 MG PO TABS: 650.0000 mg | ORAL_TABLET | Freq: Once | ORAL | Status: DC

## 2018-09-10 MED ORDER — DIPHENHYDRAMINE HCL 25 MG PO CAPS: 50.0000 mg | ORAL_CAPSULE | Freq: Once | ORAL | Status: DC

## 2018-09-10 MED ORDER — SODIUM CHLORIDE 0.9% FLUSH
10.0000 mL | INTRAVENOUS | Status: DC | PRN
  Administered 2018-09-10: 08:00:00 10 mL
  Filled 2018-09-10: qty 10

## 2018-09-10 MED ORDER — SODIUM CHLORIDE 0.9 % IV SOLN
Freq: Once | INTRAVENOUS | Status: AC
  Administered 2018-09-10: 09:00:00 via INTRAVENOUS
  Filled 2018-09-10: qty 250

## 2018-09-10 MED ORDER — SODIUM CHLORIDE 0.9% FLUSH
10.0000 mL | INTRAVENOUS | Status: DC | PRN
  Administered 2018-09-10: 10 mL
  Filled 2018-09-10: qty 10

## 2018-09-10 NOTE — Patient Instructions (Signed)

## 2018-09-10 NOTE — Patient Instructions (Signed)
[facility] Discharge Instructions for Patients Receiving Chemotherapy  Today you received the following Immunotherapy agent: Kadcyla/Ado-trastuzumab  To help prevent nausea and vomiting after your treatment, we encourage you to take your nausea medication as directed by your MD.   If you develop nausea and vomiting that is not controlled by your nausea medication, call the clinic.   BELOW ARE SYMPTOMS THAT SHOULD BE REPORTED IMMEDIATELY:  *FEVER GREATER THAN 100.5 F  *CHILLS WITH OR WITHOUT FEVER  NAUSEA AND VOMITING THAT IS NOT CONTROLLED WITH YOUR NAUSEA MEDICATION  *UNUSUAL SHORTNESS OF BREATH  *UNUSUAL BRUISING OR BLEEDING  TENDERNESS IN MOUTH AND THROAT WITH OR WITHOUT PRESENCE OF ULCERS  *URINARY PROBLEMS  *BOWEL PROBLEMS  UNUSUAL RASH Items with * indicate a potential emergency and should be followed up as soon as possible.  Feel free to call the clinic should you have any questions or concerns. The clinic phone number is [phone].  Please show the [place] at check-in to the Emergency Department and triage nurse.  Coronavirus (COVID-19) Are you at risk?  Are you at risk for the Coronavirus (COVID-19)?  To be considered HIGH RISK for Coronavirus (COVID-19), you have to meet the following criteria:  . Traveled to [place], [place], [place], [place] or [place]; or in the [place] to [place], [place], [place], or [place]; and have fever, cough, and shortness of breath within the last 2 weeks of travel OR . Been in close contact with a person diagnosed with COVID-19 within the last 2 weeks and have fever, cough, and shortness of breath . IF YOU DO NOT MEET THESE CRITERIA, YOU ARE CONSIDERED LOW RISK FOR COVID-19.  What to do if you are HIGH RISK for COVID-19?  Marland Kitchen If you are having a medical emergency, call 911. . Seek medical care right away. Before you go to a doctor's office, urgent care or emergency department, call ahead and  tell them about your recent travel, contact with someone diagnosed with COVID-19, and your symptoms. You should receive instructions from your physician's office regarding next steps of care.  . When you arrive at healthcare provider, tell the healthcare staff immediately you have returned from visiting [place], [place], [place], [place] or [place]; or traveled in the [place] to [place], [place], [place], or [place]; in the last two weeks or you have been in close contact with a person diagnosed with COVID-19 in the last 2 weeks.   . Tell the health care staff about your symptoms: fever, cough and shortness of breath. . After you have been seen by a medical provider, you will be either: o Tested for (COVID-19) and discharged home on quarantine except to seek medical care if symptoms worsen, and asked to  - Stay home and avoid contact with others until you get your results (4-5 days)  - Avoid travel on public transportation if possible (such as bus, train, or airplane) or o Sent to the Emergency Department by EMS for evaluation, COVID-19 testing, and possible admission depending on your condition and test results.  What to do if you are LOW RISK for COVID-19?  Reduce your risk of any infection by using the same precautions used for avoiding the common cold or flu:  Marland Kitchen Wash your hands often with soap and warm water for at least 20 seconds.  If soap and water are not readily available, use an alcohol-based hand sanitizer with at least 60% alcohol.  . If  coughing or sneezing, cover your mouth and nose by coughing or sneezing into the elbow areas of your shirt or coat, into a tissue or into your sleeve (not your hands). . Avoid shaking hands with others and consider head nods or verbal greetings only. . Avoid touching your eyes, nose, or mouth with unwashed hands.  . Avoid close contact with people who are sick. . Avoid places or events with large numbers of people in one location, like  concerts or sporting events. . Carefully consider travel plans you have or are making. . If you are planning any travel outside or inside the [place], visit the CDC's Travelers' Health webpage for the latest health notices. . If you have some symptoms but not all symptoms, continue to monitor at home and seek medical attention if your symptoms worsen. . If you are having a medical emergency, call 911.   [place] / e-Visit: [email or web address]         [facility]: [facility]: [phone]                   [facility]: [phone]

## 2018-09-10 NOTE — Progress Notes (Signed)
Pt. took own home premedications Tylenol 650mg  po and Benadryl 50mg  po. Completed 30 minute post observation, tolerated treatment well.

## 2018-09-15 ENCOUNTER — Other Ambulatory Visit (HOSPITAL_COMMUNITY): Payer: Self-pay | Admitting: Psychiatry

## 2018-09-15 DIAGNOSIS — F330 Major depressive disorder, recurrent, mild: Secondary | ICD-10-CM

## 2018-09-21 ENCOUNTER — Other Ambulatory Visit: Payer: Self-pay | Admitting: Hematology and Oncology

## 2018-09-25 NOTE — Assessment & Plan Note (Signed)
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  Treatment plan: 1.Neoadjuvant chemotherapy with [place] Perjeta 12/04/2017-12/26/2019followed by Kadcyla x 1 year 2.breast conserving surgery with sentinel lymph node biopsy: 04/23/2018 3.Radiation therapy:to finish up on 07/15/2018 4.Adjuvant antiestrogen therapy -------------------------------------------------------------------------------------------------------------------------------- 04/23/2018:Right lumpectomy: Small residual invasive cancer multifocal 0.15 cm, grade 2, 0/3 lymph nodes all negative, ER 90%, PR 60%, HER-2 +3+ by IHC, Ki-67 20%, RCB class I, ympT1a, ypN0  Echo 03/11/2018: EF 60-65%  Current treatment: Kadycla: she is tolerating this well. Her labs are stable, we will continue to monitor.  She feels tired for 1 to 2 days after Kadcyla but then gets better and feels normal.  return every three weeks for Kadcyla with follow up with myself every 6 weeks

## 2018-09-29 ENCOUNTER — Telehealth: Payer: Self-pay | Admitting: *Deleted

## 2018-09-29 NOTE — Telephone Encounter (Signed)
Received msg from pt stating having feeling of burning pain to the top and balls of feet and at the connection of toes to ball of foot. This sensation started after last Kadcyla treatment.  Notes both hands are without symptoms.  Informed Dr. Lindi Adie and desk nurse. Pt scheduled to see Dr. Lindi Adie on 7/2.

## 2018-09-30 ENCOUNTER — Telehealth: Payer: Self-pay

## 2018-09-30 MED ORDER — GABAPENTIN 300 MG PO CAPS: 300.0000 mg | ORAL_CAPSULE | Freq: Every day | ORAL | 3 refills | Status: DC

## 2018-09-30 NOTE — Progress Notes (Signed)
Patient Care Team: Briscoe Deutscher, DO as PCP - General (Family Medicine) Macario Carls, MD as Referring Physician (Specialist) Erroll Luna, MD as Consulting Physician (General Surgery) Nicholas Lose, MD as Consulting Physician (Hematology and Oncology) Kyung Rudd, MD as Consulting Physician (Radiation Oncology)  DIAGNOSIS:    ICD-10-CM   1. Malignant neoplasm of upper-outer quadrant of right breast in female, estrogen receptor positive ([place])  C50.411    Z17.0     SUMMARY OF ONCOLOGIC HISTORY: Oncology History  Malignant neoplasm of upper-outer quadrant of right breast in female, estrogen receptor positive ([place])  11/11/2017 Initial Diagnosis   Palpable right breast mass at 10 o'clock position 1.7 cm, axilla negative, biopsy revealed grade 3 IDC ER 60%, PR 10%, Ki-67 70%, HER-2 positive, T1 CN 0 stage Ia   11/19/2017 Cancer Staging   Staging form: Breast, AJCC 8th Edition - Clinical: Stage IB (cT2, cN0, cM0, G3, ER+, PR+, HER2+) - Signed by Nicholas Lose, MD on 12/04/2017   11/25/2017 Breast MRI   4.4 x 2.8 x 2.1 cm right breast malignancy UOQ, second focus 0.8 cm (biopsy planned for 12/09/2017)   12/04/2017 - 03/26/2018 Neo-Adjuvant Chemotherapy   TCH Perjeta every 3 week x6 followed by Herceptin Perjeta maintenance   01/04/2018 Genetic Testing   Negative genetic testing on the multi-cancer panel.  The Multi-Gene Panel offered by [facility] includes sequencing and/or deletion duplication testing of the following 84 genes: AIP, ALK, APC, ATM, AXIN2,BAP1,  BARD1, BLM, BMPR1A, BRCA1, BRCA2, BRIP1, CASR, CDC73, CDH1, CDK4, CDKN1B, CDKN1C, CDKN2A (p14ARF), CDKN2A (p16INK4a), CEBPA, CHEK2, CTNNA1, DICER1, DIS3L2, EGFR (c.2369C>T, p.Thr790Met variant only), EPCAM (Deletion/duplication testing only), FH, FLCN, GATA2, GPC3, GREM1 (Promoter region deletion/duplication testing only), HOXB13 (c.251G>A, p.Gly84Glu), HRAS, KIT, MAX, MEN1, MET, MITF (c.952G>A, p.Glu318Lys variant only), MLH1, MSH2, MSH3,  MSH6, MUTYH, NBN, NF1, NF2, NTHL1, PALB2, PDGFRA, PHOX2B, PMS2, POLD1, POLE, POT1, PRKAR1A, PTCH1, PTEN, RAD50, RAD51C, RAD51D, RB1, RECQL4, RET, RUNX1, SDHAF2, SDHA (sequence changes only), SDHB, SDHC, SDHD, SMAD4, SMARCA4, SMARCB1, SMARCE1, STK11, SUFU, TERC, TERT, TMEM127, TP53, TSC1, TSC2, VHL, WRN and WT1.  The report date is January 04, 2018.   04/23/2018 Surgery   Right lumpectomy: Small residual invasive cancer multifocal 0.15 cm, grade 2, 0/3 lymph nodes all negative, ER 90%, PR 60%, HER-2 +3+ by IHC, Ki-67 20%, RCB class I, ympT1a, ypN0   05/07/2018 -  Chemotherapy   The patient had ado-trastuzumab emtansine (KADCYLA) 300 mg in sodium chloride 0.9 % 250 mL chemo infusion, 3.6 mg/kg = 300 mg, Intravenous, Once, 7 of 11 cycles Administration: 300 mg (05/07/2018), 300 mg (05/28/2018), 300 mg (07/30/2018), 300 mg (08/20/2018), 300 mg (09/10/2018), 300 mg (06/18/2018), 300 mg (07/09/2018)  for chemotherapy treatment.      CHIEF COMPLIANT: Follow-up of Kadcyla maintenance  INTERVAL HISTORY: Sandra [name] is a 53 y.o. with above-mentioned history of HER2 positive right breast cancer treated with neoadjuvant chemotherapy with TCHP followed by a right lumpectomy and is currently on Kadycla maintenance every 3 weeks. On 09/29/18 she contacted the clinic noting burning sensation on the top and balls of her feet and was started on gabapentin. She presents to the clinic today for treatment.   REVIEW OF SYSTEMS:   Constitutional: Denies fevers, chills or abnormal weight loss Eyes: Denies blurriness of vision Ears, nose, mouth, throat, and face: Denies mucositis or sore throat Respiratory: Denies cough, dyspnea or wheezes Cardiovascular: Denies palpitation, chest discomfort Gastrointestinal: Denies nausea, heartburn or change in bowel habits Skin: Denies abnormal skin rashes Lymphatics: Denies  new lymphadenopathy or easy bruising Neurological: Denies numbness, tingling or new weaknesses Behavioral/Psych:  Mood is stable, no new changes  Extremities: No lower extremity edema Breast: denies any pain or lumps or nodules in either breasts All other systems were reviewed with the patient and are negative.  I have reviewed the past medical history, past surgical history, social history and family history with the patient and they are unchanged from previous note.  ALLERGIES:  has No Known Allergies.  MEDICATIONS:  Current Outpatient Medications  Medication Sig Dispense Refill   ARIPiprazole (ABILIFY) 5 MG tablet Take 1 tablet (5 mg total) by mouth daily. 30 tablet 1   betamethasone valerate ointment (VALISONE) 0.1 % Apply bid for 1-2 weeks as needed for flare of lichen sclerosis. 15 g 1   Biotin 10 MG CAPS Take by mouth.     clonazePAM (KLONOPIN) 0.5 MG tablet Take 1 tablet (0.5 mg total) by mouth at bedtime. 30 tablet 1   gabapentin (NEURONTIN) 300 MG capsule Take 1 capsule (300 mg total) by mouth at bedtime. 30 capsule 3   ibuprofen (ADVIL,MOTRIN) 800 MG tablet Take 1 tablet (800 mg total) by mouth every 8 (eight) hours as needed. 30 tablet 0   lidocaine-prilocaine (EMLA) cream Apply 1 application topically daily as needed (port use).   5   LORazepam (ATIVAN) 1 MG tablet Take 1 tablet (1 mg total) by mouth every 8 (eight) hours as needed for anxiety (or nausea). (Patient not taking: Reported on 07/09/2018) 30 tablet 3   pantoprazole (PROTONIX) 40 MG tablet TAKE 1 TABLET BY MOUTH EVERY DAY 30 tablet 1   venlafaxine XR (EFFEXOR-XR) 150 MG 24 hr capsule Take 1 capsule (150 mg total) by mouth daily with breakfast. 90 capsule 0   No current facility-administered medications for this visit.     PHYSICAL EXAMINATION: ECOG PERFORMANCE STATUS: 1 - Symptomatic but completely ambulatory  Vitals:   10/01/18 1107  BP: 131/72  Pulse: (!) 107  Resp: 20  Temp: 99.6 F (37.6 C)  SpO2: 100%   Filed Weights   10/01/18 1107  Weight: 184 lb 4.8 oz (83.6 kg)    GENERAL: alert, no distress and  comfortable SKIN: skin color, texture, turgor are normal, no rashes or significant lesions EYES: normal, Conjunctiva are pink and non-injected, sclera clear OROPHARYNX: no exudate, no erythema and lips, buccal mucosa, and tongue normal  NECK: supple, thyroid normal size, non-tender, without nodularity LYMPH: no palpable lymphadenopathy in the cervical, axillary or inguinal LUNGS: clear to auscultation and percussion with normal breathing effort HEART: regular rate & rhythm and no murmurs and no lower extremity edema ABDOMEN: abdomen soft, non-tender and normal bowel sounds MUSCULOSKELETAL: no cyanosis of digits and no clubbing  NEURO: alert & oriented x 3 with fluent speech, no focal motor/sensory deficits EXTREMITIES: No lower extremity edema  LABORATORY DATA:  I have reviewed the data as listed CMP Latest Ref Rng & Units 09/10/2018 08/20/2018 07/30/2018  Glucose 70 - 99 mg/dL 81 81 85  BUN 6 - 20 mg/dL _0 Creatinine 0.44 - 1.00 mg/dL 0.93 0.92 0.96  Sodium 135 - 145 mmol/L 139 137 140  Potassium 3.5 - 5.1 mmol/L 3.8 3.8 3.9  Chloride 98 - 111 mmol/L 103 101 102  CO2 22 - 32 mmol/L _1 Calcium 8.9 - 10.3 mg/dL 9.2 9.4 9.3  Total Protein 6.5 - 8.1 g/dL 7.3 7.5 7.5  Total Bilirubin 0.3 - 1.2 mg/dL 0.3 0.3 0.3  Alkaline  Phos 38 - 126 U/L 68 64 82  AST 15 - 41 U/L 31 31 39  ALT 0 - 44 U/L 31 28 43    Lab Results  Component Value Date   WBC 4.2 10/01/2018   HGB 11.4 (L) 10/01/2018   HCT 34.6 (L) 10/01/2018   MCV 91.8 10/01/2018   PLT 156 10/01/2018   NEUTROABS 2.3 10/01/2018    ASSESSMENT & PLAN:  Malignant neoplasm of upper-outer quadrant of right breast in female, estrogen receptor positive ([place]) 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%, PR 10%, Ki-67 70%, HER-2 positive, T1 CN 0 stage Ia Breast MRI 11/25/2017: 4.4 x 2.8 x 2.1 cm right breast malignancy UOQ, second focus 0.8 cm(biopsy  plannedfor 12/09/2017), overall extent of disease is 7 cm 12/12/2017:Biopsy results of the MRI guided biopsies 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  Treatment plan: 1.Neoadjuvant chemotherapy with [place] Perjeta 12/04/2017-12/26/2019followed by Kadcyla x 1 year 2.breast conserving surgery with sentinel lymph node biopsy: 04/23/2018 3.Radiation therapy:to finish up on 07/15/2018 4.Adjuvant antiestrogen therapy -------------------------------------------------------------------------------------------------------------------------------- 04/23/2018:Right lumpectomy: Small residual invasive cancer multifocal 0.15 cm, grade 2, 0/3 lymph nodes all negative, ER 90%, PR 60%, HER-2 +3+ by IHC, Ki-67 20%, RCB class I, ympT1a, ypN0  Echo 03/11/2018: EF 60-65%  Current treatment: Kadycla: she is tolerating this well. Her labs are stable, we will continue to monitor.  She feels tired for 1 to 2 days after Kadcyla but then gets better and feels normal.  Neuropathy in her feet: Slightly getting worse.  We will reduce the dosage of her Kadcyla.  She was also started on gabapentin. I recommended that she ice her feet and hands during treatment.  return every three weeks for Kadcyla with follow up with myself every 3 weeks  No orders of the defined types were placed in this encounter.  The patient has a good understanding of the overall plan. she agrees with it. she will call with any problems that may develop before the next visit here.  Nicholas Lose, MD 10/01/2018  Julious Oka Dorshimer am acting as scribe for Dr. Nicholas Lose.  I have reviewed the above documentation for accuracy and completeness, and I agree with the above.

## 2018-09-30 NOTE — Telephone Encounter (Signed)
Per Dr. Geralyn Flash recommendation regarding neuropathy, Gabapentin 300mg  QHS and will evaluate on upcoming appointment.    RN notified patient, patient voiced understanding and agreement.  Rx sent to pharmacy.

## 2018-10-01 ENCOUNTER — Encounter: Payer: Self-pay | Admitting: *Deleted

## 2018-10-01 ENCOUNTER — Other Ambulatory Visit: Payer: Self-pay

## 2018-10-01 ENCOUNTER — Inpatient Hospital Stay (HOSPITAL_BASED_OUTPATIENT_CLINIC_OR_DEPARTMENT_OTHER): Payer: BC Managed Care – PPO | Admitting: Hematology and Oncology

## 2018-10-01 ENCOUNTER — Inpatient Hospital Stay: Payer: BC Managed Care – PPO

## 2018-10-01 ENCOUNTER — Inpatient Hospital Stay: Payer: BC Managed Care – PPO | Attending: Hematology and Oncology

## 2018-10-01 VITALS — HR 82

## 2018-10-01 DIAGNOSIS — C50411 Malignant neoplasm of upper-outer quadrant of right female breast: Secondary | ICD-10-CM

## 2018-10-01 DIAGNOSIS — Z170 Estrogen receptor positive status [ER+]: Secondary | ICD-10-CM | POA: Diagnosis not present

## 2018-10-01 DIAGNOSIS — G629 Polyneuropathy, unspecified: Secondary | ICD-10-CM | POA: Diagnosis not present

## 2018-10-01 DIAGNOSIS — Z5112 Encounter for antineoplastic immunotherapy: Secondary | ICD-10-CM | POA: Diagnosis not present

## 2018-10-01 DIAGNOSIS — Z95828 Presence of other vascular implants and grafts: Secondary | ICD-10-CM

## 2018-10-01 LAB — CMP (CANCER CENTER ONLY)
ALT: 66 U/L — ABNORMAL HIGH (ref 0–44)
AST: 51 U/L — ABNORMAL HIGH (ref 15–41)
Albumin: 3.5 g/dL (ref 3.5–5.0)
Alkaline Phosphatase: 78 U/L (ref 38–126)
Anion gap: 9 (ref 5–15)
BUN: 13 mg/dL (ref 6–20)
CO2: 27 mmol/L (ref 22–32)
Calcium: 9.2 mg/dL (ref 8.9–10.3)
Chloride: 104 mmol/L (ref 98–111)
Creatinine: 0.94 mg/dL (ref 0.44–1.00)
GFR, Est AFR Am: >60 mL/min (ref >60)
GFR, Estimated: >60 mL/min (ref >60)
Glucose, Bld: 100 mg/dL — ABNORMAL HIGH (ref 70–99)
Potassium: 3.6 mmol/L (ref 3.5–5.1)
Sodium: 140 mmol/L (ref 135–145)
Total Bilirubin: 0.3 mg/dL (ref 0.3–1.2)
Total Protein: 7.4 g/dL (ref 6.5–8.1)

## 2018-10-01 LAB — CBC WITH DIFFERENTIAL (CANCER CENTER ONLY)
Abs Immature Granulocytes: 0.01 10*3/uL (ref 0.00–0.07)
Basophils Absolute: 0 10*3/uL (ref 0.0–0.1)
Basophils Relative: 0 %
Eosinophils Absolute: 0.1 10*3/uL (ref 0.0–0.5)
Eosinophils Relative: 3 %
HCT: 34.6 % — ABNORMAL LOW (ref 36.0–46.0)
Hemoglobin: 11.4 g/dL — ABNORMAL LOW (ref 12.0–15.0)
Immature Granulocytes: 0 %
Lymphocytes Relative: 33 %
Lymphs Abs: 1.4 10*3/uL (ref 0.7–4.0)
MCH: 30.2 pg (ref 26.0–34.0)
MCHC: 32.9 g/dL (ref 30.0–36.0)
MCV: 91.8 fL (ref 80.0–100.0)
Monocytes Absolute: 0.4 10*3/uL (ref 0.1–1.0)
Monocytes Relative: 9 %
Neutro Abs: 2.3 10*3/uL (ref 1.7–7.7)
Neutrophils Relative %: 55 %
Platelet Count: 156 10*3/uL (ref 150–400)
RBC: 3.77 MIL/uL — ABNORMAL LOW (ref 3.87–5.11)
RDW: 14.7 % (ref 11.5–15.5)
WBC Count: 4.2 10*3/uL (ref 4.0–10.5)
nRBC: 0 % (ref 0.0–0.2)

## 2018-10-01 MED ORDER — SODIUM CHLORIDE 0.9% FLUSH
10.0000 mL | INTRAVENOUS | Status: DC | PRN
  Administered 2018-10-01: 10 mL
  Filled 2018-10-01: qty 10

## 2018-10-01 MED ORDER — ACETAMINOPHEN 325 MG PO TABS: 650.0000 mg | ORAL_TABLET | Freq: Once | ORAL | Status: DC

## 2018-10-01 MED ORDER — SODIUM CHLORIDE 0.9 % IV SOLN
2.4000 mg/kg | Freq: Once | INTRAVENOUS | Status: AC
  Administered 2018-10-01: 200 mg via INTRAVENOUS
  Filled 2018-10-01: qty 10

## 2018-10-01 MED ORDER — SODIUM CHLORIDE 0.9 % IV SOLN
Freq: Once | INTRAVENOUS | Status: AC
  Administered 2018-10-01: 12:00:00 via INTRAVENOUS
  Filled 2018-10-01: qty 250

## 2018-10-01 MED ORDER — HEPARIN SOD (PORK) LOCK FLUSH 100 UNIT/ML IV SOLN
500.0000 [IU] | Freq: Once | INTRAVENOUS | Status: DC | PRN
  Filled 2018-10-01: qty 5

## 2018-10-01 MED ORDER — HEPARIN SOD (PORK) LOCK FLUSH 100 UNIT/ML IV SOLN
500.0000 [IU] | Freq: Once | INTRAVENOUS | Status: AC | PRN
  Administered 2018-10-01: 500 [IU]
  Filled 2018-10-01: qty 5

## 2018-10-01 MED ORDER — DIPHENHYDRAMINE HCL 25 MG PO CAPS: 50.0000 mg | ORAL_CAPSULE | Freq: Once | ORAL | Status: DC

## 2018-10-01 NOTE — Patient Instructions (Signed)

## 2018-10-01 NOTE — Patient Instructions (Signed)
[facility] Discharge Instructions for Patients Receiving Chemotherapy  Today you received the following Immunotherapy agent: Kadcyla/Ado-trastuzumab  To help prevent nausea and vomiting after your treatment, we encourage you to take your nausea medication as directed by your MD.   If you develop nausea and vomiting that is not controlled by your nausea medication, call the clinic.   BELOW ARE SYMPTOMS THAT SHOULD BE REPORTED IMMEDIATELY:  *FEVER GREATER THAN 100.5 F  *CHILLS WITH OR WITHOUT FEVER  NAUSEA AND VOMITING THAT IS NOT CONTROLLED WITH YOUR NAUSEA MEDICATION  *UNUSUAL SHORTNESS OF BREATH  *UNUSUAL BRUISING OR BLEEDING  TENDERNESS IN MOUTH AND THROAT WITH OR WITHOUT PRESENCE OF ULCERS  *URINARY PROBLEMS  *BOWEL PROBLEMS  UNUSUAL RASH Items with * indicate a potential emergency and should be followed up as soon as possible.  Feel free to call the clinic should you have any questions or concerns. The clinic phone number is [phone].  Please show the [facility] at check-in to the Emergency Department and triage nurse.  Coronavirus (COVID-19) Are you at risk?  Are you at risk for the Coronavirus (COVID-19)?  To be considered HIGH RISK for Coronavirus (COVID-19), you have to meet the following criteria:  . Traveled to [place], [place], [place], [place] or [place]; or in the [place] to [place], [place], [place], or [place]; and have fever, cough, and shortness of breath within the last 2 weeks of travel OR . Been in close contact with a person diagnosed with COVID-19 within the last 2 weeks and have fever, cough, and shortness of breath . IF YOU DO NOT MEET THESE CRITERIA, YOU ARE CONSIDERED LOW RISK FOR COVID-19.  What to do if you are HIGH RISK for COVID-19?  Marland Kitchen If you are having a medical emergency, call 911. . Seek medical care right away. Before you go to a doctor's office, urgent care or emergency department, call ahead and  tell them about your recent travel, contact with someone diagnosed with COVID-19, and your symptoms. You should receive instructions from your physician's office regarding next steps of care.  . When you arrive at healthcare provider, tell the healthcare staff immediately you have returned from visiting [place], [place], [place], [place] or [place]; or traveled in the [place] to [place], [place], [place], or [place]; in the last two weeks or you have been in close contact with a person diagnosed with COVID-19 in the last 2 weeks.   . Tell the health care staff about your symptoms: fever, cough and shortness of breath. . After you have been seen by a medical provider, you will be either: o Tested for (COVID-19) and discharged home on quarantine except to seek medical care if symptoms worsen, and asked to  - Stay home and avoid contact with others until you get your results (4-5 days)  - Avoid travel on public transportation if possible (such as bus, train, or airplane) or o Sent to the Emergency Department by EMS for evaluation, COVID-19 testing, and possible admission depending on your condition and test results.  What to do if you are LOW RISK for COVID-19?  Reduce your risk of any infection by using the same precautions used for avoiding the common cold or flu:  Marland Kitchen Wash your hands often with soap and warm water for at least 20 seconds.  If soap and water are not readily available, use an alcohol-based hand sanitizer with at least 60% alcohol.  . If  coughing or sneezing, cover your mouth and nose by coughing or sneezing into the elbow areas of your shirt or coat, into a tissue or into your sleeve (not your hands). . Avoid shaking hands with others and consider head nods or verbal greetings only. . Avoid touching your eyes, nose, or mouth with unwashed hands.  . Avoid close contact with people who are sick. . Avoid places or events with large numbers of people in one location, like  concerts or sporting events. . Carefully consider travel plans you have or are making. . If you are planning any travel outside or inside the [place], visit the CDC's Travelers' Health webpage for the latest health notices. . If you have some symptoms but not all symptoms, continue to monitor at home and seek medical attention if your symptoms worsen. . If you are having a medical emergency, call 911.   [place] [facility] / e-Visit: [email or web address]         [facility]: [facility]: [phone]                   [facility]: [phone]

## 2018-10-01 NOTE — Progress Notes (Signed)
Spoke with patient in infusion room to schedule 12 month Upbeat Assessment. Window for this visit is 09/30/18 thru 01/28/19.  Patient agreed to study visit on 12/22/18 at 10 am.  Informed patient this visit will include fasting labs, physical functions testing, VS, neurocognitive testing and questionnaires along with medication review.  She verbalized understanding.  Thanked patient very much for her time and ongoing participation in this study.  Foye Spurling, BSN, RN Clinical Research Nurse 10/01/2018 2:01 PM

## 2018-10-05 ENCOUNTER — Telehealth: Payer: Self-pay | Admitting: Hematology and Oncology

## 2018-10-05 NOTE — Telephone Encounter (Signed)
I talk with patient regarding schedule  

## 2018-10-08 DIAGNOSIS — F4323 Adjustment disorder with mixed anxiety and depressed mood: Secondary | ICD-10-CM | POA: Diagnosis not present

## 2018-10-19 ENCOUNTER — Other Ambulatory Visit (HOSPITAL_COMMUNITY): Payer: Self-pay | Admitting: Psychiatry

## 2018-10-19 DIAGNOSIS — F330 Major depressive disorder, recurrent, mild: Secondary | ICD-10-CM

## 2018-10-22 ENCOUNTER — Inpatient Hospital Stay: Payer: BC Managed Care – PPO

## 2018-10-22 ENCOUNTER — Other Ambulatory Visit: Payer: Self-pay

## 2018-10-22 VITALS — BP 119/69 | HR 71 | Temp 98.3°F | Resp 17 | Ht 71.0 in | Wt 187.0 lb

## 2018-10-22 DIAGNOSIS — Z170 Estrogen receptor positive status [ER+]: Secondary | ICD-10-CM | POA: Diagnosis not present

## 2018-10-22 DIAGNOSIS — G629 Polyneuropathy, unspecified: Secondary | ICD-10-CM | POA: Diagnosis not present

## 2018-10-22 DIAGNOSIS — Z5112 Encounter for antineoplastic immunotherapy: Secondary | ICD-10-CM | POA: Diagnosis not present

## 2018-10-22 DIAGNOSIS — C50411 Malignant neoplasm of upper-outer quadrant of right female breast: Secondary | ICD-10-CM

## 2018-10-22 DIAGNOSIS — Z95828 Presence of other vascular implants and grafts: Secondary | ICD-10-CM

## 2018-10-22 LAB — CBC WITH DIFFERENTIAL (CANCER CENTER ONLY)
Abs Immature Granulocytes: 0 10*3/uL (ref 0.00–0.07)
Basophils Absolute: 0 10*3/uL (ref 0.0–0.1)
Basophils Relative: 0 %
Eosinophils Absolute: 0.1 10*3/uL (ref 0.0–0.5)
Eosinophils Relative: 4 %
HCT: 33.5 % — ABNORMAL LOW (ref 36.0–46.0)
Hemoglobin: 11.3 g/dL — ABNORMAL LOW (ref 12.0–15.0)
Immature Granulocytes: 0 %
Lymphocytes Relative: 35 %
Lymphs Abs: 1.3 10*3/uL (ref 0.7–4.0)
MCH: 30.5 pg (ref 26.0–34.0)
MCHC: 33.7 g/dL (ref 30.0–36.0)
MCV: 90.3 fL (ref 80.0–100.0)
Monocytes Absolute: 0.4 10*3/uL (ref 0.1–1.0)
Monocytes Relative: 10 %
Neutro Abs: 1.8 10*3/uL (ref 1.7–7.7)
Neutrophils Relative %: 51 %
Platelet Count: 136 10*3/uL — ABNORMAL LOW (ref 150–400)
RBC: 3.71 MIL/uL — ABNORMAL LOW (ref 3.87–5.11)
RDW: 14 % (ref 11.5–15.5)
WBC Count: 3.6 10*3/uL — ABNORMAL LOW (ref 4.0–10.5)
nRBC: 0 % (ref 0.0–0.2)

## 2018-10-22 LAB — CMP (CANCER CENTER ONLY)
ALT: 20 U/L (ref 0–44)
AST: 23 U/L (ref 15–41)
Albumin: 3.5 g/dL (ref 3.5–5.0)
Alkaline Phosphatase: 71 U/L (ref 38–126)
Anion gap: 10 (ref 5–15)
BUN: 14 mg/dL (ref 6–20)
CO2: 26 mmol/L (ref 22–32)
Calcium: 9.4 mg/dL (ref 8.9–10.3)
Chloride: 105 mmol/L (ref 98–111)
Creatinine: 0.92 mg/dL (ref 0.44–1.00)
GFR, Est AFR Am: >60 mL/min (ref >60)
GFR, Estimated: >60 mL/min (ref >60)
Glucose, Bld: 85 mg/dL (ref 70–99)
Potassium: 3.7 mmol/L (ref 3.5–5.1)
Sodium: 141 mmol/L (ref 135–145)
Total Bilirubin: 0.3 mg/dL (ref 0.3–1.2)
Total Protein: 7.4 g/dL (ref 6.5–8.1)

## 2018-10-22 MED ORDER — SODIUM CHLORIDE 0.9 % IV SOLN
Freq: Once | INTRAVENOUS | Status: AC
  Administered 2018-10-22: 09:00:00 via INTRAVENOUS
  Filled 2018-10-22: qty 250

## 2018-10-22 MED ORDER — SODIUM CHLORIDE 0.9% FLUSH
10.0000 mL | INTRAVENOUS | Status: DC | PRN
  Administered 2018-10-22: 10 mL
  Filled 2018-10-22: qty 10

## 2018-10-22 MED ORDER — SODIUM CHLORIDE 0.9 % IV SOLN
2.4000 mg/kg | Freq: Once | INTRAVENOUS | Status: AC
  Administered 2018-10-22: 200 mg via INTRAVENOUS
  Filled 2018-10-22: qty 10

## 2018-10-22 MED ORDER — DIPHENHYDRAMINE HCL 25 MG PO CAPS: 50.0000 mg | ORAL_CAPSULE | Freq: Once | ORAL | Status: DC

## 2018-10-22 MED ORDER — ACETAMINOPHEN 325 MG PO TABS: 650.0000 mg | ORAL_TABLET | Freq: Once | ORAL | Status: DC

## 2018-10-22 MED ORDER — HEPARIN SOD (PORK) LOCK FLUSH 100 UNIT/ML IV SOLN
500.0000 [IU] | Freq: Once | INTRAVENOUS | Status: AC | PRN
  Administered 2018-10-22: 500 [IU]
  Filled 2018-10-22: qty 5

## 2018-10-22 NOTE — Patient Instructions (Signed)
[facility] Discharge Instructions for Patients Receiving Chemotherapy  Today you received the following Immunotherapy agent: Kadcyla/Ado-trastuzumab  To help prevent nausea and vomiting after your treatment, we encourage you to take your nausea medication as directed by your MD.   If you develop nausea and vomiting that is not controlled by your nausea medication, call the clinic.   BELOW ARE SYMPTOMS THAT SHOULD BE REPORTED IMMEDIATELY:  *FEVER GREATER THAN 100.5 F  *CHILLS WITH OR WITHOUT FEVER  NAUSEA AND VOMITING THAT IS NOT CONTROLLED WITH YOUR NAUSEA MEDICATION  *UNUSUAL SHORTNESS OF BREATH  *UNUSUAL BRUISING OR BLEEDING  TENDERNESS IN MOUTH AND THROAT WITH OR WITHOUT PRESENCE OF ULCERS  *URINARY PROBLEMS  *BOWEL PROBLEMS  UNUSUAL RASH Items with * indicate a potential emergency and should be followed up as soon as possible.  Feel free to call the clinic should you have any questions or concerns. The clinic phone number is [phone].  Please show the [facility] at check-in to the Emergency Department and triage nurse.

## 2018-10-22 NOTE — Patient Instructions (Signed)

## 2018-10-23 DIAGNOSIS — F4323 Adjustment disorder with mixed anxiety and depressed mood: Secondary | ICD-10-CM | POA: Diagnosis not present

## 2018-11-05 ENCOUNTER — Other Ambulatory Visit: Payer: Self-pay

## 2018-11-05 ENCOUNTER — Ambulatory Visit (HOSPITAL_BASED_OUTPATIENT_CLINIC_OR_DEPARTMENT_OTHER)
Admission: RE | Admit: 2018-11-05 | Discharge: 2018-11-05 | Disposition: A | Discharge status: Home / Self Care | Payer: BC Managed Care – PPO | Source: Ambulatory Visit | Attending: Cardiology | Admitting: Cardiology

## 2018-11-05 ENCOUNTER — Encounter (HOSPITAL_COMMUNITY): Payer: Self-pay | Admitting: Cardiology

## 2018-11-05 ENCOUNTER — Ambulatory Visit (HOSPITAL_COMMUNITY)
Admission: RE | Admit: 2018-11-05 | Discharge: 2018-11-05 | Disposition: A | Discharge status: Home / Self Care | Payer: BC Managed Care – PPO | Source: Ambulatory Visit | Attending: Hematology and Oncology | Admitting: Hematology and Oncology

## 2018-11-05 VITALS — BP 130/82 | HR 85 | Wt 194.8 lb

## 2018-11-05 DIAGNOSIS — Z823 Family history of stroke: Secondary | ICD-10-CM | POA: Diagnosis not present

## 2018-11-05 DIAGNOSIS — Z800 Family history of malignant neoplasm of digestive organs: Secondary | ICD-10-CM | POA: Diagnosis not present

## 2018-11-05 DIAGNOSIS — C50411 Malignant neoplasm of upper-outer quadrant of right female breast: Secondary | ICD-10-CM

## 2018-11-05 DIAGNOSIS — Z9221 Personal history of antineoplastic chemotherapy: Secondary | ICD-10-CM | POA: Insufficient documentation

## 2018-11-05 DIAGNOSIS — E785 Hyperlipidemia, unspecified: Secondary | ICD-10-CM | POA: Diagnosis not present

## 2018-11-05 DIAGNOSIS — Z79899 Other long term (current) drug therapy: Secondary | ICD-10-CM | POA: Diagnosis not present

## 2018-11-05 DIAGNOSIS — C50911 Malignant neoplasm of unspecified site of right female breast: Secondary | ICD-10-CM | POA: Diagnosis not present

## 2018-11-05 DIAGNOSIS — Z170 Estrogen receptor positive status [ER+]: Secondary | ICD-10-CM | POA: Diagnosis not present

## 2018-11-05 DIAGNOSIS — F329 Major depressive disorder, single episode, unspecified: Secondary | ICD-10-CM | POA: Insufficient documentation

## 2018-11-05 DIAGNOSIS — Z923 Personal history of irradiation: Secondary | ICD-10-CM | POA: Diagnosis not present

## 2018-11-05 DIAGNOSIS — Z8249 Family history of ischemic heart disease and other diseases of the circulatory system: Secondary | ICD-10-CM | POA: Diagnosis not present

## 2018-11-05 DIAGNOSIS — Z171 Estrogen receptor negative status [ER-]: Secondary | ICD-10-CM

## 2018-11-05 NOTE — Assessment & Plan Note (Signed)
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  Treatment plan: 1.Neoadjuvant chemotherapy with [place] Perjeta 12/04/2017-12/26/2019followed by Kadcyla x 1 year 2.breast conserving surgery with sentinel lymph node biopsy: 04/23/2018 3.Radiation therapy:to finish up on 07/15/2018 4.Adjuvant antiestrogen therapy -------------------------------------------------------------------------------------------------------------------------------- 04/23/2018:Right lumpectomy: Small residual invasive cancer multifocal 0.15 cm, grade 2, 0/3 lymph nodes all negative, ER 90%, PR 60%, HER-2 +3+ by IHC, Ki-67 20%, RCB class I, ympT1a, ypN0  Echo 03/11/2018: EF 60-65%  Current treatment: Kadycla: she is tolerating this well. Her labs are stable, we will continue to monitor.She feels tired for 1 to 2 days after Kadcyla but then gets better and feels normal.  Neuropathy in her feet: Slightly getting worse.  We reduced the dosage of her Kadcyla. Also on gabapentin. I recommended that she ice her feet and hands during treatment.  return every three weeks for Kadcyla with follow up with myself every 6 weeks

## 2018-11-05 NOTE — Progress Notes (Signed)
Pt requested that labs be drawn at Dr. Geralyn Flash office. Pt needs Lipid profile. Placed order for them to pull.

## 2018-11-05 NOTE — Patient Instructions (Addendum)
Lab work do be done at Dr. Geralyn Flash office.  Please follow up with the [facility] as needed.

## 2018-11-05 NOTE — Progress Notes (Signed)
  Echocardiogram 2D Echocardiogram has been performed.  Sandra [name] 11/05/2018, 1:51 PM

## 2018-11-08 NOTE — Progress Notes (Signed)
Oncology: Dr. Lindi Adie  53 y.o. with history of depression, hyperlipidemia, and breast cancer was referred by Dr. Lindi Adie for cardio-oncology evaluation.  Right breast cancer was diagnosed in 8/19, ER+/PR+/HER2+.  She had TCH-Perjeta every 3 wks x 6 then Herceptin/Perjecta.  She had right lumpectomy in 1/20.  In 2/20, she was started on Kadcyla, planned for 11 cycles.  She completed radiation in 4/20.  She will be finishing Kadcyla on 12/03/18.    She does have a strong history of vascular disease, her mother had CVA and MI in her early 35s.  She has no history of exertional chest pain or dyspnea.  No palpitations.    PMH: 1. Depression 2. Endometriosis 3. Hyperlipidemia 4. Breast cancer:  Right breast cancer diagnosed in 8/19, ER+/PR+/HER2+.  She had TCH-Perjeta every 3 wks x 6 then Herceptin/Perjeta.  She had right lumpectomy in 1/20.  In 2/20, she was started on Kadcyla, planned for 11 cycles.  She completed radiation in 4/20.  - Echo (12/19): EF 60-65%, GLS -17.4% - Echo (4/20): EF 55-60%, GLS -15.5% - Echo (8/20): EF 60-65%, GLS -31%, diastolic function normal.   Social History   Socioeconomic History  . Marital status: Married    Spouse name: [name]  . Number of children: 2  . Years of education: Not on file  . Highest education level: Bachelor's degree (e.g., BA, AB, BS)  Occupational History  . Not on file  Social Needs  . Financial resource strain: Patient refused  . Food insecurity    Worry: Patient refused    Inability: Patient refused  . Transportation needs    Medical: No    Non-medical: No  Tobacco Use  . Smoking status: Never Smoker  . Smokeless tobacco: Never Used  Substance and Sexual Activity  . Alcohol use: No    Alcohol/week: 0.0 standard drinks  . Drug use: No  . Sexual activity: Yes    Partners: Male    Comment: Mirena inserted 05/16/16, IUD is to be removed08/29/19  Lifestyle  . Physical activity    Days per week: Patient refused    Minutes per session:  Patient refused  . Stress: Patient refused  Relationships  . Social Herbalist on phone: Patient refused    Gets together: Not on file    Attends religious service: Not on file    Active member of club or organization: Not on file    Attends meetings of clubs or organizations: Not on file    Relationship status: Not on file  . Intimate partner violence    Fear of current or ex partner: No    Emotionally abused: No    Physically abused: No    Forced sexual activity: No  Other Topics Concern  . Not on file  Social History Narrative  . Not on file   Family History  Problem Relation Age of Onset  . Stroke Mother   . Heart disease Mother   . Colon cancer Father   . Prostate cancer Father   . Hypertension Brother   . Cancer Maternal Uncle        NOS  . Breast cancer Paternal Aunt        dx in her 22s  . Prostate cancer Paternal Uncle   . Alcohol abuse Maternal Grandfather   . Breast cancer Paternal Grandmother        dx in her 42s  . Prostate cancer Paternal Grandfather   . SIDS Brother   .  Cancer Paternal Aunt   . Tuberculosis Paternal Uncle    ROS: All systems reviewed and negative except as per HPI  Current Outpatient Medications  Medication Sig Dispense Refill  . ARIPiprazole (ABILIFY) 5 MG tablet Take 1 tablet (5 mg total) by mouth daily. 30 tablet 1  . betamethasone valerate ointment (VALISONE) 0.1 % Apply bid for 1-2 weeks as needed for flare of lichen sclerosis. 15 g 1  . Biotin 10 MG CAPS Take by mouth.    . clonazePAM (KLONOPIN) 0.5 MG tablet Take 1 tablet (0.5 mg total) by mouth at bedtime. 30 tablet 1  . gabapentin (NEURONTIN) 300 MG capsule Take 1 capsule (300 mg total) by mouth at bedtime. 30 capsule 3  . lidocaine-prilocaine (EMLA) cream Apply 1 application topically daily as needed (port use).   5  . venlafaxine XR (EFFEXOR-XR) 150 MG 24 hr capsule Take 1 capsule (150 mg total) by mouth daily with breakfast. 90 capsule 0   No current  facility-administered medications for this encounter.    BP 130/82   Pulse 85   Wt 88.4 kg (194 lb 12.8 oz)   SpO2 98%   BMI 27.17 kg/m  General: NAD Neck: No JVD, no thyromegaly or thyroid nodule.  Lungs: Clear to auscultation bilaterally with normal respiratory effort. CV: Nondisplaced PMI.  Heart regular S1/S2, no S3/S4, no murmur.  No peripheral edema.  No carotid bruit.  Normal pedal pulses.  Abdomen: Soft, nontender, no hepatosplenomegaly, no distention.  Skin: Intact without lesions or rashes.  Neurologic: Alert and oriented x 3.  Psych: Normal affect. Extremities: No clubbing or cyanosis.  HEENT: Normal.   Assessment/Plan: 1. Breast cancer: We discussed the cardiac risk of Herceptin and Kadcyla, and the rationale behind echo screening.  I reviewed today's echo, this showed normal EF and strain pattern.  She will complete her last dose of Kadcyla on 12/03/18.  Based on her echo today and previous normal echoes, I do not think that she will need any further echoes unless her Kadcyla course is extended.   2. Vascular disease risk: Strong family history of CVA and MI (mother with both prior to age 33).  - I will check lipids today.  If LDL is not significantly elevated, I recommended a coronary calcium score.  If LDL is significantly elevated, I would recommend taking a statin.   Loralie Champagne 11/08/2018

## 2018-11-11 ENCOUNTER — Telehealth (HOSPITAL_COMMUNITY): Payer: Self-pay

## 2018-11-11 ENCOUNTER — Other Ambulatory Visit (HOSPITAL_COMMUNITY): Payer: Self-pay

## 2018-11-11 DIAGNOSIS — F330 Major depressive disorder, recurrent, mild: Secondary | ICD-10-CM

## 2018-11-11 DIAGNOSIS — F411 Generalized anxiety disorder: Secondary | ICD-10-CM

## 2018-11-11 MED ORDER — CLONAZEPAM 0.5 MG PO TABS: 0.5000 mg | ORAL_TABLET | Freq: Every day | ORAL | 0 refills | Status: DC

## 2018-11-11 MED ORDER — ARIPIPRAZOLE 5 MG PO TABS: 5.0000 mg | ORAL_TABLET | Freq: Every day | ORAL | 0 refills | Status: DC

## 2018-11-11 NOTE — Telephone Encounter (Signed)
Patient called requesting a refill on her Aripiprazole 5mg  and her Clonazepam 0.5mg . Patient only had 1 Aripiprazole left and is out of her Clonazepam so I sent in #[phone] refills to get her to her scheduled appointment on 12/01/18. Thank you.

## 2018-11-11 NOTE — Progress Notes (Signed)
Patient Care Team: Briscoe Deutscher, DO as PCP - General (Family Medicine) Macario Carls, MD as Referring Physician (Specialist) Erroll Luna, MD as Consulting Physician (General Surgery) Nicholas Lose, MD as Consulting Physician (Hematology and Oncology) Kyung Rudd, MD as Consulting Physician (Radiation Oncology)  DIAGNOSIS:    ICD-10-CM   1. Malignant neoplasm of upper-outer quadrant of right breast in female, estrogen receptor positive ([place])  C50.411    Z17.0     SUMMARY OF ONCOLOGIC HISTORY: Oncology History  Malignant neoplasm of upper-outer quadrant of right breast in female, estrogen receptor positive ([place])  11/11/2017 Initial Diagnosis   Palpable right breast mass at 10 o'clock position 1.7 cm, axilla negative, biopsy revealed grade 3 IDC ER 60%, PR 10%, Ki-67 70%, HER-2 positive, T1 CN 0 stage Ia   11/19/2017 Cancer Staging   Staging form: Breast, AJCC 8th Edition - Clinical: Stage IB (cT2, cN0, cM0, G3, ER+, PR+, HER2+) - Signed by Nicholas Lose, MD on 12/04/2017   11/25/2017 Breast MRI   4.4 x 2.8 x 2.1 cm right breast malignancy UOQ, second focus 0.8 cm (biopsy planned for 12/09/2017)   12/04/2017 - 03/26/2018 Neo-Adjuvant Chemotherapy   TCH Perjeta every 3 week x6 followed by Herceptin Perjeta maintenance   01/04/2018 Genetic Testing   Negative genetic testing on the multi-cancer panel.  The Multi-Gene Panel offered by [facility] includes sequencing and/or deletion duplication testing of the following 84 genes: AIP, ALK, APC, ATM, AXIN2,BAP1,  BARD1, BLM, BMPR1A, BRCA1, BRCA2, BRIP1, CASR, CDC73, CDH1, CDK4, CDKN1B, CDKN1C, CDKN2A (p14ARF), CDKN2A (p16INK4a), CEBPA, CHEK2, CTNNA1, DICER1, DIS3L2, EGFR (c.2369C>T, p.Thr790Met variant only), EPCAM (Deletion/duplication testing only), FH, FLCN, GATA2, GPC3, GREM1 (Promoter region deletion/duplication testing only), HOXB13 (c.251G>A, p.Gly84Glu), HRAS, KIT, MAX, MEN1, MET, MITF (c.952G>A, p.Glu318Lys variant only), MLH1, MSH2, MSH3,  MSH6, MUTYH, NBN, NF1, NF2, NTHL1, PALB2, PDGFRA, PHOX2B, PMS2, POLD1, POLE, POT1, PRKAR1A, PTCH1, PTEN, RAD50, RAD51C, RAD51D, RB1, RECQL4, RET, RUNX1, SDHAF2, SDHA (sequence changes only), SDHB, SDHC, SDHD, SMAD4, SMARCA4, SMARCB1, SMARCE1, STK11, SUFU, TERC, TERT, TMEM127, TP53, TSC1, TSC2, VHL, WRN and WT1.  The report date is January 04, 2018.   04/23/2018 Surgery   Right lumpectomy: Small residual invasive cancer multifocal 0.15 cm, grade 2, 0/3 lymph nodes all negative, ER 90%, PR 60%, HER-2 +3+ by IHC, Ki-67 20%, RCB class I, ympT1a, ypN0   05/07/2018 -  Chemotherapy   The patient had ado-trastuzumab emtansine (KADCYLA) 300 mg in sodium chloride 0.9 % 250 mL chemo infusion, 3.6 mg/kg = 300 mg, Intravenous, Once, 9 of 11 cycles Dose modification: 2.4 mg/kg (original dose 3.6 mg/kg, Cycle 8, Reason: Dose not tolerated) Administration: 300 mg (05/07/2018), 300 mg (05/28/2018), 300 mg (07/30/2018), 300 mg (08/20/2018), 300 mg (09/10/2018), 200 mg (10/01/2018), 200 mg (10/22/2018), 300 mg (06/18/2018), 300 mg (07/09/2018)  for chemotherapy treatment.      CHIEF COMPLIANT: Follow-up of Kadcyla maintenance  INTERVAL HISTORY: Sandra [name] is a 53 y.o. with above-mentioned history of HER2 positive right breast cancer treated with neoadjuvant chemotherapy with TCHP followed by a right lumpectomy and is currently on Kadycla maintenance every 3 weeks. Echo on 11/05/18 showed an ejection fraction of 60%. She presents to the clinic today for treatment.   REVIEW OF SYSTEMS:   Constitutional: Denies fevers, chills or abnormal weight loss Eyes: Denies blurriness of vision Ears, nose, mouth, throat, and face: Denies mucositis or sore throat Respiratory: Denies cough, dyspnea or wheezes Cardiovascular: Denies palpitation, chest discomfort Gastrointestinal: Denies nausea, heartburn or change in bowel habits  Skin: Denies abnormal skin rashes Lymphatics: Denies new lymphadenopathy or easy bruising Neurological: Denies  numbness, tingling or new weaknesses Behavioral/Psych: Mood is stable, no new changes  Extremities: No lower extremity edema Breast: denies any pain or lumps or nodules in either breasts All other systems were reviewed with the patient and are negative.  I have reviewed the past medical history, past surgical history, social history and family history with the patient and they are unchanged from previous note.  ALLERGIES:  has No Known Allergies.  MEDICATIONS:  Current Outpatient Medications  Medication Sig Dispense Refill   ARIPiprazole (ABILIFY) 5 MG tablet Take 1 tablet (5 mg total) by mouth daily. 30 tablet 0   betamethasone valerate ointment (VALISONE) 0.1 % Apply bid for 1-2 weeks as needed for flare of lichen sclerosis. 15 g 1   Biotin 10 MG CAPS Take by mouth.     clonazePAM (KLONOPIN) 0.5 MG tablet Take 1 tablet (0.5 mg total) by mouth at bedtime. 30 tablet 0   gabapentin (NEURONTIN) 300 MG capsule Take 1 capsule (300 mg total) by mouth at bedtime. 30 capsule 3   lidocaine-prilocaine (EMLA) cream Apply 1 application topically daily as needed (port use).   5   venlafaxine XR (EFFEXOR-XR) 150 MG 24 hr capsule Take 1 capsule (150 mg total) by mouth daily with breakfast. 90 capsule 0   No current facility-administered medications for this visit.     PHYSICAL EXAMINATION: ECOG PERFORMANCE STATUS: 1 - Symptomatic but completely ambulatory  Vitals:   11/12/18 1026  BP: 106/71  Pulse: 90  Resp: 17  Temp: 98 F (36.7 C)  SpO2: 100%   Filed Weights   11/12/18 1026  Weight: 190 lb 12.8 oz (86.5 kg)    GENERAL: alert, no distress and comfortable SKIN: skin color, texture, turgor are normal, no rashes or significant lesions EYES: normal, Conjunctiva are pink and non-injected, sclera clear OROPHARYNX: no exudate, no erythema and lips, buccal mucosa, and tongue normal  NECK: supple, thyroid normal size, non-tender, without nodularity LYMPH: no palpable lymphadenopathy  in the cervical, axillary or inguinal LUNGS: clear to auscultation and percussion with normal breathing effort HEART: regular rate & rhythm and no murmurs and no lower extremity edema ABDOMEN: abdomen soft, non-tender and normal bowel sounds MUSCULOSKELETAL: no cyanosis of digits and no clubbing  NEURO: alert & oriented x 3 with fluent speech, no focal motor/sensory deficits EXTREMITIES: No lower extremity edema  LABORATORY DATA:  I have reviewed the data as listed CMP Latest Ref Rng & Units 10/22/2018 10/01/2018 09/10/2018  Glucose 70 - 99 mg/dL 85 100(H) 81  BUN 6 - 20 mg/dL _0 Creatinine 0.44 - 1.00 mg/dL 0.92 0.94 0.93  Sodium 135 - 145 mmol/L 141 140 139  Potassium 3.5 - 5.1 mmol/L 3.7 3.6 3.8  Chloride 98 - 111 mmol/L 105 104 103  CO2 22 - 32 mmol/L _1 Calcium 8.9 - 10.3 mg/dL 9.4 9.2 9.2  Total Protein 6.5 - 8.1 g/dL 7.4 7.4 7.3  Total Bilirubin 0.3 - 1.2 mg/dL 0.3 0.3 0.3  Alkaline Phos 38 - 126 U/L 71 78 68  AST 15 - 41 U/L 23 51(H) 31  ALT 0 - 44 U/L 20 66(H) 31    Lab Results  Component Value Date   WBC 3.5 (L) 11/12/2018   HGB 11.8 (L) 11/12/2018   HCT 35.1 (L) 11/12/2018   MCV 91.4 11/12/2018   PLT 163 11/12/2018   NEUTROABS 1.8 11/12/2018  ASSESSMENT & PLAN:  Malignant neoplasm of upper-outer quadrant of right breast in female, estrogen receptor positive ([place]) 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  Treatment plan: 1.Neoadjuvant chemotherapy with [place] Perjeta 12/04/2017-12/26/2019followed by Kadcyla x 1 year 2.breast conserving surgery with  sentinel lymph node biopsy: 04/23/2018 3.Radiation therapy:to finish up on 07/15/2018 4.Adjuvant antiestrogen therapy -------------------------------------------------------------------------------------------------------------------------------- 04/23/2018:Right lumpectomy: Small residual invasive cancer multifocal 0.15 cm, grade 2, 0/3 lymph nodes all negative, ER 90%, PR 60%, HER-2 +3+ by IHC, Ki-67 20%, RCB class I, ympT1a, ypN0  Echo 03/11/2018: EF 60-65%  Current treatment: Kadycla: she is tolerating this well. Her labs are stable, we will continue to monitor.She feels tired for 1 to 2 days after Kadcyla but then gets better and feels normal.  Neuropathy in her feet: Slightly getting worse.  We reduced the dosage of her Kadcyla. Also on gabapentin. I recommended that she ice her feet and hands during treatment.  Return in 3 weeks for last cycle. After that we will begin antiestrogen treatment with anastrozole.  Her last menstrual cycle was over 3 years ago. I did not recommend neratinib because she is lymph node negative at diagnosis.  No orders of the defined types were placed in this encounter.  The patient has a good understanding of the overall plan. she agrees with it. she will call with any problems that may develop before the next visit here.  Nicholas Lose, MD 11/12/2018  Julious Oka Dorshimer am acting as scribe for Dr. Nicholas Lose.  I have reviewed the above documentation for accuracy and completeness, and I agree with the above.

## 2018-11-11 NOTE — Telephone Encounter (Signed)
ok 

## 2018-11-12 ENCOUNTER — Other Ambulatory Visit: Payer: Self-pay

## 2018-11-12 ENCOUNTER — Inpatient Hospital Stay: Payer: BC Managed Care – PPO

## 2018-11-12 ENCOUNTER — Inpatient Hospital Stay (HOSPITAL_BASED_OUTPATIENT_CLINIC_OR_DEPARTMENT_OTHER): Payer: BC Managed Care – PPO | Admitting: Hematology and Oncology

## 2018-11-12 ENCOUNTER — Inpatient Hospital Stay: Payer: BC Managed Care – PPO | Attending: Hematology and Oncology

## 2018-11-12 DIAGNOSIS — Z5112 Encounter for antineoplastic immunotherapy: Secondary | ICD-10-CM | POA: Diagnosis not present

## 2018-11-12 DIAGNOSIS — C50411 Malignant neoplasm of upper-outer quadrant of right female breast: Secondary | ICD-10-CM | POA: Insufficient documentation

## 2018-11-12 DIAGNOSIS — Z170 Estrogen receptor positive status [ER+]: Secondary | ICD-10-CM

## 2018-11-12 DIAGNOSIS — E7800 Pure hypercholesterolemia, unspecified: Secondary | ICD-10-CM

## 2018-11-12 DIAGNOSIS — Z95828 Presence of other vascular implants and grafts: Secondary | ICD-10-CM

## 2018-11-12 LAB — CBC WITH DIFFERENTIAL (CANCER CENTER ONLY)
Abs Immature Granulocytes: 0.01 10*3/uL (ref 0.00–0.07)
Basophils Absolute: 0 10*3/uL (ref 0.0–0.1)
Basophils Relative: 0 %
Eosinophils Absolute: 0.2 10*3/uL (ref 0.0–0.5)
Eosinophils Relative: 4 %
HCT: 35.1 % — ABNORMAL LOW (ref 36.0–46.0)
Hemoglobin: 11.8 g/dL — ABNORMAL LOW (ref 12.0–15.0)
Immature Granulocytes: 0 %
Lymphocytes Relative: 34 %
Lymphs Abs: 1.2 10*3/uL (ref 0.7–4.0)
MCH: 30.7 pg (ref 26.0–34.0)
MCHC: 33.6 g/dL (ref 30.0–36.0)
MCV: 91.4 fL (ref 80.0–100.0)
Monocytes Absolute: 0.4 10*3/uL (ref 0.1–1.0)
Monocytes Relative: 11 %
Neutro Abs: 1.8 10*3/uL (ref 1.7–7.7)
Neutrophils Relative %: 51 %
Platelet Count: 163 10*3/uL (ref 150–400)
RBC: 3.84 MIL/uL — ABNORMAL LOW (ref 3.87–5.11)
RDW: 13.8 % (ref 11.5–15.5)
WBC Count: 3.5 10*3/uL — ABNORMAL LOW (ref 4.0–10.5)
nRBC: 0 % (ref 0.0–0.2)

## 2018-11-12 LAB — CMP (CANCER CENTER ONLY)
ALT: 27 U/L (ref 0–44)
AST: 27 U/L (ref 15–41)
Albumin: 3.8 g/dL (ref 3.5–5.0)
Alkaline Phosphatase: 71 U/L (ref 38–126)
Anion gap: 7 (ref 5–15)
BUN: 17 mg/dL (ref 6–20)
CO2: 28 mmol/L (ref 22–32)
Calcium: 9.6 mg/dL (ref 8.9–10.3)
Chloride: 103 mmol/L (ref 98–111)
Creatinine: 0.93 mg/dL (ref 0.44–1.00)
GFR, Est AFR Am: >60 mL/min (ref >60)
GFR, Estimated: >60 mL/min (ref >60)
Glucose, Bld: 83 mg/dL (ref 70–99)
Potassium: 3.8 mmol/L (ref 3.5–5.1)
Sodium: 138 mmol/L (ref 135–145)
Total Bilirubin: 0.3 mg/dL (ref 0.3–1.2)
Total Protein: 7.4 g/dL (ref 6.5–8.1)

## 2018-11-12 MED ORDER — SODIUM CHLORIDE 0.9% FLUSH
10.0000 mL | INTRAVENOUS | Status: DC | PRN
  Administered 2018-11-12: 10 mL
  Filled 2018-11-12: qty 10

## 2018-11-12 MED ORDER — SODIUM CHLORIDE 0.9 % IV SOLN
2.4000 mg/kg | Freq: Once | INTRAVENOUS | Status: AC
  Administered 2018-11-12: 200 mg via INTRAVENOUS
  Filled 2018-11-12: qty 10

## 2018-11-12 MED ORDER — HEPARIN SOD (PORK) LOCK FLUSH 100 UNIT/ML IV SOLN
500.0000 [IU] | Freq: Once | INTRAVENOUS | Status: AC | PRN
  Administered 2018-11-12: 500 [IU]
  Filled 2018-11-12: qty 5

## 2018-11-12 MED ORDER — SODIUM CHLORIDE 0.9 % IV SOLN
Freq: Once | INTRAVENOUS | Status: AC
  Administered 2018-11-12: 11:00:00 via INTRAVENOUS
  Filled 2018-11-12: qty 250

## 2018-11-12 NOTE — Progress Notes (Signed)
Patient taking own benadryl and tylenol prior to treatment today.

## 2018-11-12 NOTE — Patient Instructions (Signed)
[facility] Discharge Instructions for Patients Receiving Chemotherapy  Today you received the following chemotherapy agents Kadcyla  To help prevent nausea and vomiting after your treatment, we encourage you to take your nausea medication as directed   If you develop nausea and vomiting that is not controlled by your nausea medication, call the clinic.   BELOW ARE SYMPTOMS THAT SHOULD BE REPORTED IMMEDIATELY:  *FEVER GREATER THAN 100.5 F  *CHILLS WITH OR WITHOUT FEVER  NAUSEA AND VOMITING THAT IS NOT CONTROLLED WITH YOUR NAUSEA MEDICATION  *UNUSUAL SHORTNESS OF BREATH  *UNUSUAL BRUISING OR BLEEDING  TENDERNESS IN MOUTH AND THROAT WITH OR WITHOUT PRESENCE OF ULCERS  *URINARY PROBLEMS  *BOWEL PROBLEMS  UNUSUAL RASH Items with * indicate a potential emergency and should be followed up as soon as possible.  Feel free to call the clinic should you have any questions or concerns. The clinic phone number is [phone].  Please show the CHEMO ALERT CARD at check-in to the Emergency Department and triage nurse.   

## 2018-11-13 DIAGNOSIS — F4323 Adjustment disorder with mixed anxiety and depressed mood: Secondary | ICD-10-CM | POA: Diagnosis not present

## 2018-11-19 ENCOUNTER — Other Ambulatory Visit (HOSPITAL_COMMUNITY): Payer: Self-pay | Admitting: Psychiatry

## 2018-11-19 DIAGNOSIS — F411 Generalized anxiety disorder: Secondary | ICD-10-CM

## 2018-11-26 NOTE — Assessment & Plan Note (Signed)
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  Treatment plan: 1.Neoadjuvant chemotherapy with [place] Perjeta 12/04/2017-12/26/2019followed by Kadcyla x 1 year 2.breast conserving surgery with sentinel lymph node biopsy: 04/23/2018 3.Radiation therapy:to finish up on 07/15/2018 4.Adjuvant antiestrogen therapy -------------------------------------------------------------------------------------------------------------------------------- 04/23/2018:Right lumpectomy: Small residual invasive cancer multifocal 0.15 cm, grade 2, 0/3 lymph nodes all negative, ER 90%, PR 60%, HER-2 +3+ by IHC, Ki-67 20%, RCB class I, ympT1a, ypN0  Echo 03/11/2018: EF 60-65%  Current treatment:Kadycla: she is tolerating this well. Her labs are stable, we will continue to monitor.She feels tired for 1 to 2 days after Kadcyla but then gets better and feels normal.  Neuropathy in her feet: Slightly getting worse. We reduced the dosage of her Kadcyla. Also on gabapentin. I recommended that she ice her feet and hands during treatment.  Today is her last cycle of chemotherapy.  Treatment plan: Begin antiestrogen treatment with anastrozole.  Her last menstrual cycle was over 3 years ago. I did not recommend neratinib because she is lymph node negative at diagnosis.  Anastrozole counseling:We discussed the risks and benefits of anti-estrogen therapy with aromatase inhibitors. These include but not limited to insomnia, hot flashes, mood changes, vaginal dryness, bone density loss, and weight gain. We strongly believe that  the benefits far outweigh the risks. Patient understands these risks and consented to starting treatment. Planned treatment duration is 7 years.  Return to clinic in 3 months for survivorship care plan visit

## 2018-11-27 DIAGNOSIS — F4323 Adjustment disorder with mixed anxiety and depressed mood: Secondary | ICD-10-CM | POA: Diagnosis not present

## 2018-12-01 ENCOUNTER — Ambulatory Visit (INDEPENDENT_AMBULATORY_CARE_PROVIDER_SITE_OTHER): Payer: BC Managed Care – PPO | Admitting: Psychiatry

## 2018-12-01 ENCOUNTER — Other Ambulatory Visit: Payer: Self-pay

## 2018-12-01 ENCOUNTER — Telehealth (HOSPITAL_COMMUNITY): Payer: Self-pay | Admitting: Psychiatry

## 2018-12-01 ENCOUNTER — Encounter (HOSPITAL_COMMUNITY): Payer: Self-pay | Admitting: Psychiatry

## 2018-12-01 DIAGNOSIS — F330 Major depressive disorder, recurrent, mild: Secondary | ICD-10-CM

## 2018-12-01 DIAGNOSIS — F411 Generalized anxiety disorder: Secondary | ICD-10-CM

## 2018-12-01 MED ORDER — CLONAZEPAM 1 MG PO TABS: 1.0000 mg | ORAL_TABLET | Freq: Every day | ORAL | 1 refills | Status: DC

## 2018-12-01 MED ORDER — FLUOXETINE HCL 10 MG PO CAPS: 10.0000 mg | ORAL_CAPSULE | Freq: Every day | ORAL | 1 refills | Status: DC

## 2018-12-01 MED ORDER — VENLAFAXINE HCL ER 150 MG PO CP24: 150.0000 mg | ORAL_CAPSULE | Freq: Every day | ORAL | 0 refills | Status: DC

## 2018-12-01 MED ORDER — ARIPIPRAZOLE 5 MG PO TABS: 2.5000 mg | ORAL_TABLET | Freq: Every day | ORAL | 0 refills | Status: DC

## 2018-12-01 NOTE — Progress Notes (Signed)
Virtual Visit via Telephone Note  I connected with Sandra [name] on 12/01/18 at  8:40 AM EDT by telephone and verified that I am speaking with the correct person using two identifiers.   I discussed the limitations, risks, security and privacy concerns of performing an evaluation and management service by telephone and the availability of in person appointments. I also discussed with the patient that there may be a patient responsible charge related to this service. The patient expressed understanding and agreed to proceed.   History of Present Illness: Patient was evaluated through phone session.  On her last visit we increase Abilify, added low-dose Klonopin and trazodone discontinued as patient complaining of insomnia and lack of energy.  Patient did not see any improvement with increase Abilify.  She remains very tired, depressed and anxious.  She also started to have symptoms which she believe due to menopause.  She gets very emotional, tearful and having crying spells.  Though she able to sleep with the Klonopin but does not sleep all night.  Next week she is getting last infusion and her doctor recommended that she may need estrogen for the symptoms of menopause.  She is tolerating her medication and reported no side effects.  She has no tremors shakes.  She started therapy with Jake Seats.  She lives with her 24 year old daughter and her husband.  Her husband is very supportive but daughter started now school combination with a virtual and in person.  She admitted some stress since her daughter started school.  She denies any feeling of hopelessness or worthlessness but admitted increased anxiety, feeling tired, lack of motivation to do things.  She denies any hallucination, paranoia or any suicidal thoughts.  She has no tremors.  We are still awaiting records from Gene[employer] testing results which we have requested and [facility] but we did not receive GeneSight testing  results.  Patient admitted weight gain more than 10 pounds in past 2 months.  She feels proud that she stopped the soda but not able to lose weight.  She denies drinking or using any illegal substances.  Patient has blood work last week.  Her CBC and chemistry is stable.   Past Psychiatric History:Reviewed. [facility] Tried Paxil, Lexapro, ativan and trazadone but don'tremember. Lamictal did not help and Wellbutrin causeddepression. No h/opsychosis, suicidal attempt or Inpatient.  Recent Results (from the past 2160 hour(s))  CBC with Differential ([facility] Only)     Status: Abnormal   Collection Time: 09/10/18  7:58 AM  Result Value Ref Range   WBC Count 3.5 (L) 4.0 - 10.5 K/uL   RBC 3.65 (L) 3.87 - 5.11 MIL/uL   Hemoglobin 10.9 (L) 12.0 - 15.0 g/dL   HCT 32.6 (L) 36.0 - 46.0 %   MCV 89.3 80.0 - 100.0 fL   MCH 29.9 26.0 - 34.0 pg   MCHC 33.4 30.0 - 36.0 g/dL   RDW 13.9 11.5 - 15.5 %   Platelet Count 135 (L) 150 - 400 K/uL   nRBC 0.0 0.0 - 0.2 %   Neutrophils Relative % 52 %   Neutro Abs 1.8 1.7 - 7.7 K/uL   Lymphocytes Relative 33 %   Lymphs Abs 1.2 0.7 - 4.0 K/uL   Monocytes Relative 10 %   Monocytes Absolute 0.4 0.1 - 1.0 K/uL   Eosinophils Relative 5 %   Eosinophils Absolute 0.2 0.0 - 0.5 K/uL   Basophils Relative 0 %  Basophils Absolute 0.0 0.0 - 0.1 K/uL   Immature Granulocytes 0 %   Abs Immature Granulocytes 0.00 0.00 - 0.07 K/uL    Comment: Performed at [facility], [address], [place], [place] [address]  CMP ([place] only)     Status: None   Collection Time: 09/10/18  7:58 AM  Result Value Ref Range   Sodium 139 135 - 145 mmol/L   Potassium 3.8 3.5 - 5.1 mmol/L   Chloride 103 98 - 111 mmol/L   CO2 25 22 - 32 mmol/L   Glucose, Bld 81 70 - 99 mg/dL   BUN 14 6 - 20 mg/dL   Creatinine 0.93 0.44 - 1.00 mg/dL   Calcium 9.2 8.9 - 10.3 mg/dL   Total Protein 7.3 6.5 - 8.1 g/dL   Albumin 3.5 3.5  - 5.0 g/dL   AST 31 15 - 41 U/L   ALT 31 0 - 44 U/L   Alkaline Phosphatase 68 38 - 126 U/L   Total Bilirubin 0.3 0.3 - 1.2 mg/dL   GFR, Est Non Af Am >60 >60 mL/min   GFR, Est AFR Am >60 >60 mL/min   Anion gap 11 5 - 15    Comment: Performed at [facility], [place] [address] [place], [place] [address]  CMP ([place] only)     Status: Abnormal   Collection Time: 10/01/18 11:00 AM  Result Value Ref Range   Sodium 140 135 - 145 mmol/L   Potassium 3.6 3.5 - 5.1 mmol/L   Chloride 104 98 - 111 mmol/L   CO2 27 22 - 32 mmol/L   Glucose, Bld 100 (H) 70 - 99 mg/dL   BUN 13 6 - 20 mg/dL   Creatinine 0.94 0.44 - 1.00 mg/dL   Calcium 9.2 8.9 - 10.3 mg/dL   Total Protein 7.4 6.5 - 8.1 g/dL   Albumin 3.5 3.5 - 5.0 g/dL   AST 51 (H) 15 - 41 U/L   ALT 66 (H) 0 - 44 U/L   Alkaline Phosphatase 78 38 - 126 U/L   Total Bilirubin 0.3 0.3 - 1.2 mg/dL   GFR, Est Non Af Am >60 >60 mL/min   GFR, Est AFR Am >60 >60 mL/min   Anion gap 9 5 - 15    Comment: Performed at [facility], [address], [place], [place] [address]  CBC with Differential ([place] Only)     Status: Abnormal   Collection Time: 10/01/18 11:00 AM  Result Value Ref Range   WBC Count 4.2 4.0 - 10.5 K/uL   RBC 3.77 (L) 3.87 - 5.11 MIL/uL   Hemoglobin 11.4 (L) 12.0 - 15.0 g/dL   HCT 34.6 (L) 36.0 - 46.0 %   MCV 91.8 80.0 - 100.0 fL   MCH 30.2 26.0 - 34.0 pg   MCHC 32.9 30.0 - 36.0 g/dL   RDW 14.7 11.5 - 15.5 %   Platelet Count 156 150 - 400 K/uL   nRBC 0.0 0.0 - 0.2 %   Neutrophils Relative % 55 %   Neutro Abs 2.3 1.7 - 7.7 K/uL   Lymphocytes Relative 33 %   Lymphs Abs 1.4 0.7 - 4.0 K/uL   Monocytes Relative 9 %   Monocytes Absolute 0.4 0.1 - 1.0 K/uL   Eosinophils Relative 3 %   Eosinophils Absolute 0.1 0.0 - 0.5 K/uL   Basophils Relative 0 %   Basophils Absolute 0.0 0.0 - 0.1 K/uL  Immature Granulocytes 0 %   Abs Immature Granulocytes 0.01 0.00 - 0.07 K/uL     Comment: Performed at [facility], [place] [address] [place], [place] [address]  CMP ([place] only)     Status: None   Collection Time: 10/22/18  8:11 AM  Result Value Ref Range   Sodium 141 135 - 145 mmol/L   Potassium 3.7 3.5 - 5.1 mmol/L   Chloride 105 98 - 111 mmol/L   CO2 26 22 - 32 mmol/L   Glucose, Bld 85 70 - 99 mg/dL   BUN 14 6 - 20 mg/dL   Creatinine 0.92 0.44 - 1.00 mg/dL   Calcium 9.4 8.9 - 10.3 mg/dL   Total Protein 7.4 6.5 - 8.1 g/dL   Albumin 3.5 3.5 - 5.0 g/dL   AST 23 15 - 41 U/L   ALT 20 0 - 44 U/L   Alkaline Phosphatase 71 38 - 126 U/L   Total Bilirubin 0.3 0.3 - 1.2 mg/dL   GFR, Est Non Af Am >60 >60 mL/min   GFR, Est AFR Am >60 >60 mL/min   Anion gap 10 5 - 15    Comment: Performed at [facility], [place] [address], [place], [place] [address]  CBC with Differential ([place] Only)     Status: Abnormal   Collection Time: 10/22/18  8:11 AM  Result Value Ref Range   WBC Count 3.6 (L) 4.0 - 10.5 K/uL   RBC 3.71 (L) 3.87 - 5.11 MIL/uL   Hemoglobin 11.3 (L) 12.0 - 15.0 g/dL   HCT 33.5 (L) 36.0 - 46.0 %   MCV 90.3 80.0 - 100.0 fL   MCH 30.5 26.0 - 34.0 pg   MCHC 33.7 30.0 - 36.0 g/dL   RDW 14.0 11.5 - 15.5 %   Platelet Count 136 (L) 150 - 400 K/uL   nRBC 0.0 0.0 - 0.2 %   Neutrophils Relative % 51 %   Neutro Abs 1.8 1.7 - 7.7 K/uL   Lymphocytes Relative 35 %   Lymphs Abs 1.3 0.7 - 4.0 K/uL   Monocytes Relative 10 %   Monocytes Absolute 0.4 0.1 - 1.0 K/uL   Eosinophils Relative 4 %   Eosinophils Absolute 0.1 0.0 - 0.5 K/uL   Basophils Relative 0 %   Basophils Absolute 0.0 0.0 - 0.1 K/uL   Immature Granulocytes 0 %   Abs Immature Granulocytes 0.00 0.00 - 0.07 K/uL    Comment: Performed at [facility], [place] [address] [place],  [address]  CMP ([place] only)     Status: None   Collection Time: 11/12/18 10:18 AM  Result Value Ref Range   Sodium 138 135 - 145  mmol/L   Potassium 3.8 3.5 - 5.1 mmol/L   Chloride 103 98 - 111 mmol/L   CO2 28 22 - 32 mmol/L   Glucose, Bld 83 70 - 99 mg/dL   BUN 17 6 - 20 mg/dL   Creatinine 0.93 0.44 - 1.00 mg/dL   Calcium 9.6 8.9 - 10.3 mg/dL   Total Protein 7.4 6.5 - 8.1 g/dL   Albumin 3.8 3.5 - 5.0 g/dL   AST 27 15 - 41 U/L   ALT 27 0 - 44 U/L   Alkaline Phosphatase 71 38 - 126 U/L   Total Bilirubin 0.3 0.3 - 1.2 mg/dL   GFR, Est Non Af Am >60 >60 mL/min   GFR, Est AFR Am >60 >60 mL/min  Anion gap 7 5 - 15    Comment: Performed at [facility], [address], [place], [place] [address]  CBC with Differential ([place] Only)     Status: Abnormal   Collection Time: 11/12/18 10:18 AM  Result Value Ref Range   WBC Count 3.5 (L) 4.0 - 10.5 K/uL   RBC 3.84 (L) 3.87 - 5.11 MIL/uL   Hemoglobin 11.8 (L) 12.0 - 15.0 g/dL   HCT 35.1 (L) 36.0 - 46.0 %   MCV 91.4 80.0 - 100.0 fL   MCH 30.7 26.0 - 34.0 pg   MCHC 33.6 30.0 - 36.0 g/dL   RDW 13.8 11.5 - 15.5 %   Platelet Count 163 150 - 400 K/uL   nRBC 0.0 0.0 - 0.2 %   Neutrophils Relative % 51 %   Neutro Abs 1.8 1.7 - 7.7 K/uL   Lymphocytes Relative 34 %   Lymphs Abs 1.2 0.7 - 4.0 K/uL   Monocytes Relative 11 %   Monocytes Absolute 0.4 0.1 - 1.0 K/uL   Eosinophils Relative 4 %   Eosinophils Absolute 0.2 0.0 - 0.5 K/uL   Basophils Relative 0 %   Basophils Absolute 0.0 0.0 - 0.1 K/uL   Immature Granulocytes 0 %   Abs Immature Granulocytes 0.01 0.00 - 0.07 K/uL    Comment: Performed at [facility], [place] [address] [place], [place] [address]       Psychiatric Specialty Exam: Physical Exam  Review of Systems  Constitutional: Positive for malaise/fatigue. Negative for weight loss.  Psychiatric/Behavioral: Positive for depression. The patient is nervous/anxious.     There were no vitals taken for this visit.There is no height or weight on file to calculate BMI.  General Appearance: NA  Eye Contact:   NA  Speech:  Clear and Coherent  Volume:  Normal  Mood:  Anxious, Depressed and Dysphoric  Affect:  NA  Thought Process:  Goal Directed  Orientation:  Full (Time, Place, and Person)  Thought Content:  Rumination  Suicidal Thoughts:  No  Homicidal Thoughts:  No  Memory:  Immediate;   Good Recent;   Good Remote;   Good  Judgement:  Good  Insight:  Good  Psychomotor Activity:  Decreased  Concentration:  Concentration: Good and Attention Span: Good  Recall:  Good  Fund of Knowledge:  Good  Language:  Good  Akathisia:  No  Handed:  Right  AIMS (if indicated):     Assets:  Communication Skills Desire for Improvement Housing Resilience Social Support  ADL's:  Intact  Cognition:  WNL  Sleep:   fair      Assessment and Plan: Major depressive disorder, recurrent.  Generalized anxiety disorder.  Discussed her current medication.  Recommend to reduce Abilify to 2.5 mg as she is feeling more tired and unmotivated to do things.  Recommend to increase Klonopin 1 mg at bedtime to help insomnia and anxiety.  Continue Effexor 150 mg daily and we will add low-dose Prozac 10 mg to help her residual depression and anxiety.  For now she will continue Prozac and Effexor combination however in the future we will reduce Effexor dose and gradually increase the Prozac dose.  We will consider either adding Viibryd or Trintellix if patient do not see any improvement with addition of Prozac.  Encouraged to continue therapy with Nehemiah Massed.  One more time we will call [facility] to get [facility]Sight testing results.  Discussed medication side effects and benefits.  Especially tremors and shakes with taken 2 antidepressant.  Recommended to call us back if she is any question or any concern.  Follow-up in 6 weeks.  Time spent 20 minutes.  Follow Up Instructions:    I discussed the assessment and treatment plan with the patient. The patient was provided an opportunity to ask questions and all were answered.  The patient agreed with the plan and demonstrated an understanding of the instructions.   The patient was advised to call back or seek an in-person evaluation if the symptoms worsen or if the condition fails to improve as anticipated.  I provided 20 minutes of non-face-to-face time during this encounter.   Kathlee Nations, MD

## 2018-12-01 NOTE — Telephone Encounter (Signed)
Received GeneSight testing results.  Effexor and Prozac is in favorable section.  We will not change treatment plan which we discussed this morning with the patient.  However if patient do not improve then we will consider adjusting medication on her next appointment.

## 2018-12-02 NOTE — Progress Notes (Signed)
Patient Care Team: Briscoe Deutscher, DO as PCP - General (Family Medicine) Macario Carls, MD as Referring Physician (Specialist) Erroll Luna, MD as Consulting Physician (General Surgery) Nicholas Lose, MD as Consulting Physician (Hematology and Oncology) Kyung Rudd, MD as Consulting Physician (Radiation Oncology)  DIAGNOSIS:    ICD-10-CM   1. Malignant neoplasm of upper-outer quadrant of right breast in female, estrogen receptor positive ([place])  C50.411    Z17.0     SUMMARY OF ONCOLOGIC HISTORY: Oncology History  Malignant neoplasm of upper-outer quadrant of right breast in female, estrogen receptor positive ([place])  11/11/2017 Initial Diagnosis   Palpable right breast mass at 10 o'clock position 1.7 cm, axilla negative, biopsy revealed grade 3 IDC ER 60%, PR 10%, Ki-67 70%, HER-2 positive, T1 CN 0 stage Ia   11/19/2017 Cancer Staging   Staging form: Breast, AJCC 8th Edition - Clinical: Stage IB (cT2, cN0, cM0, G3, ER+, PR+, HER2+) - Signed by Nicholas Lose, MD on 12/04/2017   11/25/2017 Breast MRI   4.4 x 2.8 x 2.1 cm right breast malignancy UOQ, second focus 0.8 cm (biopsy planned for 12/09/2017)   12/04/2017 - 03/26/2018 Neo-Adjuvant Chemotherapy   TCH Perjeta every 3 week x6 followed by Herceptin Perjeta maintenance   01/04/2018 Genetic Testing   Negative genetic testing on the multi-cancer panel.  The Multi-Gene Panel offered by [facility] includes sequencing and/or deletion duplication testing of the following 84 genes: AIP, ALK, APC, ATM, AXIN2,BAP1,  BARD1, BLM, BMPR1A, BRCA1, BRCA2, BRIP1, CASR, CDC73, CDH1, CDK4, CDKN1B, CDKN1C, CDKN2A (p14ARF), CDKN2A (p16INK4a), CEBPA, CHEK2, CTNNA1, DICER1, DIS3L2, EGFR (c.2369C>T, p.Thr790Met variant only), EPCAM (Deletion/duplication testing only), FH, FLCN, GATA2, GPC3, GREM1 (Promoter region deletion/duplication testing only), HOXB13 (c.251G>A, p.Gly84Glu), HRAS, KIT, MAX, MEN1, MET, MITF (c.952G>A, p.Glu318Lys variant only), MLH1, MSH2, MSH3,  MSH6, MUTYH, NBN, NF1, NF2, NTHL1, PALB2, PDGFRA, PHOX2B, PMS2, POLD1, POLE, POT1, PRKAR1A, PTCH1, PTEN, RAD50, RAD51C, RAD51D, RB1, RECQL4, RET, RUNX1, SDHAF2, SDHA (sequence changes only), SDHB, SDHC, SDHD, SMAD4, SMARCA4, SMARCB1, SMARCE1, STK11, SUFU, TERC, TERT, TMEM127, TP53, TSC1, TSC2, VHL, WRN and WT1.  The report date is January 04, 2018.   04/23/2018 Surgery   Right lumpectomy: Small residual invasive cancer multifocal 0.15 cm, grade 2, 0/3 lymph nodes all negative, ER 90%, PR 60%, HER-2 +3+ by IHC, Ki-67 20%, RCB class I, ympT1a, ypN0   05/07/2018 -  Chemotherapy   The patient had ado-trastuzumab emtansine (KADCYLA) 300 mg in sodium chloride 0.9 % 250 mL chemo infusion, 3.6 mg/kg = 300 mg, Intravenous, Once, 10 of 11 cycles Dose modification: 2.4 mg/kg (original dose 3.6 mg/kg, Cycle 8, Reason: Dose not tolerated) Administration: 300 mg (05/07/2018), 300 mg (05/28/2018), 300 mg (07/30/2018), 300 mg (08/20/2018), 300 mg (09/10/2018), 200 mg (10/01/2018), 200 mg (10/22/2018), 200 mg (11/12/2018), 300 mg (06/18/2018), 300 mg (07/09/2018)  for chemotherapy treatment.      CHIEF COMPLIANT: Follow-up of Kadcyla maintenance  INTERVAL HISTORY: Sandra [name] is a 53 y.o. with above-mentioned history of HER2 positive right breast cancer treated with neoadjuvant chemotherapy with TCHP followed by a right lumpectomy and is currently on Kadycla maintenance every 3 weeks. She presents to the clinic today for treatment.   REVIEW OF SYSTEMS:   Constitutional: Denies fevers, chills or abnormal weight loss Eyes: Denies blurriness of vision Ears, nose, mouth, throat, and face: Denies mucositis or sore throat Respiratory: Denies cough, dyspnea or wheezes Cardiovascular: Denies palpitation, chest discomfort Gastrointestinal: Denies nausea, heartburn or change in bowel habits Skin: Denies abnormal skin rashes Lymphatics:  Denies new lymphadenopathy or easy bruising Neurological: Denies numbness, tingling or new  weaknesses Behavioral/Psych: Mood is stable, no new changes  Extremities: No lower extremity edema Breast: denies any pain or lumps or nodules in either breasts All other systems were reviewed with the patient and are negative.  I have reviewed the past medical history, past surgical history, social history and family history with the patient and they are unchanged from previous note.  ALLERGIES:  has No Known Allergies.  MEDICATIONS:  Current Outpatient Medications  Medication Sig Dispense Refill  . ARIPiprazole (ABILIFY) 5 MG tablet Take 0.5 tablets (2.5 mg total) by mouth daily. 30 tablet 0  . betamethasone valerate ointment (VALISONE) 0.1 % Apply bid for 1-2 weeks as needed for flare of lichen sclerosis. 15 g 1  . Biotin 10 MG CAPS Take by mouth.    . clonazePAM (KLONOPIN) 1 MG tablet Take 1 tablet (1 mg total) by mouth at bedtime. 30 tablet 1  . FLUoxetine (PROZAC) 10 MG capsule Take 1 capsule (10 mg total) by mouth daily. 30 capsule 1  . gabapentin (NEURONTIN) 300 MG capsule Take 1 capsule (300 mg total) by mouth at bedtime. 30 capsule 3  . lidocaine-prilocaine (EMLA) cream Apply 1 application topically daily as needed (port use).   5  . venlafaxine XR (EFFEXOR-XR) 150 MG 24 hr capsule Take 1 capsule (150 mg total) by mouth daily with breakfast. 90 capsule 0   No current facility-administered medications for this visit.     PHYSICAL EXAMINATION: ECOG PERFORMANCE STATUS: 1 - Symptomatic but completely ambulatory  Vitals:   12/03/18 0946  BP: 129/73  Pulse: 84  Resp: 18  Temp: 97.8 F (36.6 C)  SpO2: 100%   Filed Weights   12/03/18 0946  Weight: 193 lb 1.6 oz (87.6 kg)    GENERAL: alert, no distress and comfortable SKIN: skin color, texture, turgor are normal, no rashes or significant lesions EYES: normal, Conjunctiva are pink and non-injected, sclera clear OROPHARYNX: no exudate, no erythema and lips, buccal mucosa, and tongue normal  NECK: supple, thyroid normal  size, non-tender, without nodularity LYMPH: no palpable lymphadenopathy in the cervical, axillary or inguinal LUNGS: clear to auscultation and percussion with normal breathing effort HEART: regular rate & rhythm and no murmurs and no lower extremity edema ABDOMEN: abdomen soft, non-tender and normal bowel sounds MUSCULOSKELETAL: no cyanosis of digits and no clubbing  NEURO: alert & oriented x 3 with fluent speech, no focal motor/sensory deficits EXTREMITIES: No lower extremity edema  LABORATORY DATA:  I have reviewed the data as listed CMP Latest Ref Rng & Units 12/03/2018 11/12/2018 10/22/2018  Glucose 70 - 99 mg/dL 82 83 85  BUN 6 - 20 mg/dL '17 17 14  ' Creatinine 0.44 - 1.00 mg/dL 0.89 0.93 0.92  Sodium 135 - 145 mmol/L 139 138 141  Potassium 3.5 - 5.1 mmol/L 3.8 3.8 3.7  Chloride 98 - 111 mmol/L 104 103 105  CO2 22 - 32 mmol/L '26 28 26  ' Calcium 8.9 - 10.3 mg/dL 9.4 9.6 9.4  Total Protein 6.5 - 8.1 g/dL 7.6 7.4 7.4  Total Bilirubin 0.3 - 1.2 mg/dL 0.3 0.3 0.3  Alkaline Phos 38 - 126 U/L 68 71 71  AST 15 - 41 U/L '29 27 23  ' ALT 0 - 44 U/L '26 27 20    ' Lab Results  Component Value Date   WBC 4.4 12/03/2018   HGB 12.2 12/03/2018   HCT 36.1 12/03/2018   MCV 89.8 12/03/2018  PLT 132 (L) 12/03/2018   NEUTROABS 2.7 12/03/2018    ASSESSMENT & PLAN:  Malignant neoplasm of upper-outer quadrant of right breast in female, estrogen receptor positive ([place]) 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  Treatment plan: 1.Neoadjuvant chemotherapy with [place] Perjeta 12/04/2017-12/26/2019followed by  Kadcyla x 1 year 2.breast conserving surgery with sentinel lymph node biopsy: 04/23/2018 3.Radiation therapy:to finish up on 07/15/2018 4.Adjuvant antiestrogen therapy -------------------------------------------------------------------------------------------------------------------------------- 04/23/2018:Right lumpectomy: Small residual invasive cancer multifocal 0.15 cm, grade 2, 0/3 lymph nodes all negative, ER 90%, PR 60%, HER-2 +3+ by IHC, Ki-67 20%, RCB class I, ympT1a, ypN0  Echo 03/11/2018: EF 60-65%  Current treatment:Kadycla: she is tolerating this well. Her labs are stable, we will continue to monitor.She feels tired for 1 to 2 days after Kadcyla but then gets better and feels normal.  Neuropathy in her feet: Slightly getting worse. We reduced the dosage of her Kadcyla. Also on gabapentin. I recommended that she ice her feet and hands during treatment.  Today is her last cycle of chemotherapy.  Treatment plan: Begin antiestrogen treatment with anastrozole.  Her last menstrual cycle was over 3 years ago.  However she was on Mirena at that time.  Therefore I will send for [facility] and estradiol today.  If she is menopausal then we will send for anastrozole.  If she is not menopausal then we will prescribe tamoxifen. I will call her with the results of this test and inform her of the medication. I did not recommend neratinib because she is lymph node negative at diagnosis.  Anastrozole counseling:We discussed the risks and benefits of anti-estrogen therapy with aromatase inhibitors. These include but not limited to insomnia, hot flashes, mood changes, vaginal dryness, bone density loss, and weight gain. We strongly believe that the benefits far outweigh the risks. Patient understands these risks and consented to starting treatment. Planned treatment duration is 7 years.  Return to clinic in 3 months for survivorship care plan visit     No orders of the defined types were  placed in this encounter.  The patient has a good understanding of the overall plan. she agrees with it. she will call with any problems that may develop before the next visit here.  Nicholas Lose, MD 12/03/2018  Julious Oka Dorshimer am acting as scribe for Dr. Nicholas Lose.  I have reviewed the above documentation for accuracy and completeness, and I agree with the above.

## 2018-12-03 ENCOUNTER — Inpatient Hospital Stay: Payer: BC Managed Care – PPO

## 2018-12-03 ENCOUNTER — Inpatient Hospital Stay: Payer: BC Managed Care – PPO | Attending: Hematology and Oncology

## 2018-12-03 ENCOUNTER — Telehealth: Payer: Self-pay | Admitting: *Deleted

## 2018-12-03 ENCOUNTER — Other Ambulatory Visit: Payer: Self-pay

## 2018-12-03 ENCOUNTER — Inpatient Hospital Stay (HOSPITAL_BASED_OUTPATIENT_CLINIC_OR_DEPARTMENT_OTHER): Payer: BC Managed Care – PPO | Admitting: Hematology and Oncology

## 2018-12-03 DIAGNOSIS — E7800 Pure hypercholesterolemia, unspecified: Secondary | ICD-10-CM | POA: Insufficient documentation

## 2018-12-03 DIAGNOSIS — Z5112 Encounter for antineoplastic immunotherapy: Secondary | ICD-10-CM | POA: Insufficient documentation

## 2018-12-03 DIAGNOSIS — Z170 Estrogen receptor positive status [ER+]: Secondary | ICD-10-CM | POA: Diagnosis not present

## 2018-12-03 DIAGNOSIS — Z95828 Presence of other vascular implants and grafts: Secondary | ICD-10-CM

## 2018-12-03 DIAGNOSIS — C50411 Malignant neoplasm of upper-outer quadrant of right female breast: Secondary | ICD-10-CM | POA: Diagnosis not present

## 2018-12-03 DIAGNOSIS — E785 Hyperlipidemia, unspecified: Secondary | ICD-10-CM

## 2018-12-03 LAB — CMP (CANCER CENTER ONLY)
ALT: 26 U/L (ref 0–44)
AST: 29 U/L (ref 15–41)
Albumin: 3.9 g/dL (ref 3.5–5.0)
Alkaline Phosphatase: 68 U/L (ref 38–126)
Anion gap: 9 (ref 5–15)
BUN: 17 mg/dL (ref 6–20)
CO2: 26 mmol/L (ref 22–32)
Calcium: 9.4 mg/dL (ref 8.9–10.3)
Chloride: 104 mmol/L (ref 98–111)
Creatinine: 0.89 mg/dL (ref 0.44–1.00)
GFR, Est AFR Am: >60 mL/min (ref >60)
GFR, Estimated: >60 mL/min (ref >60)
Glucose, Bld: 82 mg/dL (ref 70–99)
Potassium: 3.8 mmol/L (ref 3.5–5.1)
Sodium: 139 mmol/L (ref 135–145)
Total Bilirubin: 0.3 mg/dL (ref 0.3–1.2)
Total Protein: 7.6 g/dL (ref 6.5–8.1)

## 2018-12-03 LAB — CBC WITH DIFFERENTIAL (CANCER CENTER ONLY)
Abs Immature Granulocytes: 0.01 10*3/uL (ref 0.00–0.07)
Basophils Absolute: 0 10*3/uL (ref 0.0–0.1)
Basophils Relative: 1 %
Eosinophils Absolute: 0.2 10*3/uL (ref 0.0–0.5)
Eosinophils Relative: 4 %
HCT: 36.1 % (ref 36.0–46.0)
Hemoglobin: 12.2 g/dL (ref 12.0–15.0)
Immature Granulocytes: 0 %
Lymphocytes Relative: 27 %
Lymphs Abs: 1.2 10*3/uL (ref 0.7–4.0)
MCH: 30.3 pg (ref 26.0–34.0)
MCHC: 33.8 g/dL (ref 30.0–36.0)
MCV: 89.8 fL (ref 80.0–100.0)
Monocytes Absolute: 0.4 10*3/uL (ref 0.1–1.0)
Monocytes Relative: 8 %
Neutro Abs: 2.7 10*3/uL (ref 1.7–7.7)
Neutrophils Relative %: 60 %
Platelet Count: 132 10*3/uL — ABNORMAL LOW (ref 150–400)
RBC: 4.02 MIL/uL (ref 3.87–5.11)
RDW: 13.2 % (ref 11.5–15.5)
WBC Count: 4.4 10*3/uL (ref 4.0–10.5)
nRBC: 0 % (ref 0.0–0.2)

## 2018-12-03 LAB — LIPID PANEL
Cholesterol: 241 mg/dL — ABNORMAL HIGH (ref 0–200)
HDL: 59 mg/dL (ref >40)
LDL Cholesterol: 165 mg/dL — ABNORMAL HIGH (ref 0–99)
Total CHOL/HDL Ratio: 4.1 RATIO
Triglycerides: 87 mg/dL (ref <150)
VLDL: 17 mg/dL (ref 0–40)

## 2018-12-03 MED ORDER — ACETAMINOPHEN 325 MG PO TABS: 650.0000 mg | ORAL_TABLET | Freq: Once | ORAL | Status: DC

## 2018-12-03 MED ORDER — SODIUM CHLORIDE 0.9% FLUSH
10.0000 mL | INTRAVENOUS | Status: DC | PRN
  Administered 2018-12-03: 10 mL
  Filled 2018-12-03: qty 10

## 2018-12-03 MED ORDER — SODIUM CHLORIDE 0.9 % IV SOLN
Freq: Once | INTRAVENOUS | Status: AC
  Administered 2018-12-03: 11:00:00 via INTRAVENOUS
  Filled 2018-12-03: qty 250

## 2018-12-03 MED ORDER — DIPHENHYDRAMINE HCL 25 MG PO CAPS: 50.0000 mg | ORAL_CAPSULE | Freq: Once | ORAL | Status: DC

## 2018-12-03 MED ORDER — HEPARIN SOD (PORK) LOCK FLUSH 100 UNIT/ML IV SOLN
500.0000 [IU] | Freq: Once | INTRAVENOUS | Status: AC | PRN
  Administered 2018-12-03: 500 [IU]
  Filled 2018-12-03: qty 5

## 2018-12-03 MED ORDER — SODIUM CHLORIDE 0.9 % IV SOLN
2.4000 mg/kg | Freq: Once | INTRAVENOUS | Status: AC
  Administered 2018-12-03: 200 mg via INTRAVENOUS
  Filled 2018-12-03: qty 10

## 2018-12-03 NOTE — Patient Instructions (Signed)
Coronavirus (COVID-19) Are you at risk?  Are you at risk for the Coronavirus (COVID-19)?  To be considered HIGH RISK for Coronavirus (COVID-19), you have to meet the following criteria:  . Traveled to [place], [place], [place], [place] or [place]; or in the [place] to [place], [place], [place], or [place]; and have fever, cough, and shortness of breath within the last 2 weeks of travel OR . Been in close contact with a person diagnosed with COVID-19 within the last 2 weeks and have fever, cough, and shortness of breath . IF YOU DO NOT MEET THESE CRITERIA, YOU ARE CONSIDERED LOW RISK FOR COVID-19.  What to do if you are HIGH RISK for COVID-19?  . If you are having a medical emergency, call 911. . Seek medical care right away. Before you go to a doctor's office, urgent care or emergency department, call ahead and tell them about your recent travel, contact with someone diagnosed with COVID-19, and your symptoms. You should receive instructions from your physician's office regarding next steps of care.  . When you arrive at healthcare provider, tell the healthcare staff immediately you have returned from visiting [place], [place], [place], [place] or [place]; or traveled in the [place] to [place], [place], [place], or [place]; in the last two weeks or you have been in close contact with a person diagnosed with COVID-19 in the last 2 weeks.   . Tell the health care staff about your symptoms: fever, cough and shortness of breath. . After you have been seen by a medical provider, you will be either: o Tested for (COVID-19) and discharged home on quarantine except to seek medical care if symptoms worsen, and asked to  - Stay home and avoid contact with others until you get your results (4-5 days)  - Avoid travel on public transportation if possible (such as bus, train, or airplane) or o Sent to the Emergency Department by EMS for evaluation, COVID-19 testing, and possible  admission depending on your condition and test results.  What to do if you are LOW RISK for COVID-19?  Reduce your risk of any infection by using the same precautions used for avoiding the common cold or flu:  . Wash your hands often with soap and warm water for at least 20 seconds.  If soap and water are not readily available, use an alcohol-based hand sanitizer with at least 60% alcohol.  . If coughing or sneezing, cover your mouth and nose by coughing or sneezing into the elbow areas of your shirt or coat, into a tissue or into your sleeve (not your hands). . Avoid shaking hands with others and consider head nods or verbal greetings only. . Avoid touching your eyes, nose, or mouth with unwashed hands.  . Avoid close contact with people who are sick. . Avoid places or events with large numbers of people in one location, like concerts or sporting events. . Carefully consider travel plans you have or are making. . If you are planning any travel outside or inside the US, visit the CDC's Travelers' Health webpage for the latest health notices. . If you have some symptoms but not all symptoms, continue to monitor at home and seek medical attention if your symptoms worsen. . If you are having a medical emergency, call 911.   ADDITIONAL HEALTHCARE OPTIONS FOR PATIENTS  [facility] / e-Visit: [email or web address]         [facility]: [phone]  [facility]: [phone]                   [facility]: [phone]   [facility] Discharge Instructions for Patients Receiving Chemotherapy  Today you received the following chemotherapy agents [place]cy[place]   To help prevent nausea and vomiting after your treatment, we encourage you to take your nausea medication as directed.    If you develop nausea and vomiting that is not controlled by your nausea medication, call the clinic.   BELOW ARE  SYMPTOMS THAT SHOULD BE REPORTED IMMEDIATELY:  *FEVER GREATER THAN 100.5 F  *CHILLS WITH OR WITHOUT FEVER  NAUSEA AND VOMITING THAT IS NOT CONTROLLED WITH YOUR NAUSEA MEDICATION  *UNUSUAL SHORTNESS OF BREATH  *UNUSUAL BRUISING OR BLEEDING  TENDERNESS IN MOUTH AND THROAT WITH OR WITHOUT PRESENCE OF ULCERS  *URINARY PROBLEMS  *BOWEL PROBLEMS  UNUSUAL RASH Items with * indicate a potential emergency and should be followed up as soon as possible.  Feel free to call the clinic should you have any questions or concerns. The clinic phone number is [phone].  Please show the CHEMO ALERT CARD at check-in to the Emergency Department and triage nurse.   

## 2018-12-03 NOTE — Telephone Encounter (Signed)
Called pt to congratulate on completion of final treatment today. Denies questions or needs at this time. Msg sent for port removal. Encourage pt to call with needs. Received verbal understanding.

## 2018-12-04 ENCOUNTER — Telehealth: Payer: Self-pay | Admitting: Hematology and Oncology

## 2018-12-04 ENCOUNTER — Other Ambulatory Visit (HOSPITAL_COMMUNITY): Payer: Self-pay | Admitting: Psychiatry

## 2018-12-04 DIAGNOSIS — F330 Major depressive disorder, recurrent, mild: Secondary | ICD-10-CM

## 2018-12-04 LAB — FOLLICLE STIMULATING HORMONE: FSH: 81.9 m[IU]/mL

## 2018-12-04 NOTE — Telephone Encounter (Signed)
I left a message regarding schedule  

## 2018-12-08 ENCOUNTER — Other Ambulatory Visit: Payer: Self-pay | Admitting: Hematology and Oncology

## 2018-12-08 MED ORDER — ANASTROZOLE 1 MG PO TABS: 1.0000 mg | ORAL_TABLET | Freq: Every day | ORAL | 3 refills | Status: DC

## 2018-12-08 NOTE — Progress Notes (Signed)
[place] level came back in the menopausal range. I sent a prescription for anastrozole to her pharmacy.

## 2018-12-10 LAB — ESTRADIOL, ULTRA SENS: Estradiol, Sensitive: <2.5 pg/mL

## 2018-12-11 DIAGNOSIS — F4323 Adjustment disorder with mixed anxiety and depressed mood: Secondary | ICD-10-CM | POA: Diagnosis not present

## 2018-12-15 ENCOUNTER — Telehealth: Payer: Self-pay | Admitting: *Deleted

## 2018-12-16 ENCOUNTER — Telehealth (HOSPITAL_COMMUNITY): Payer: Self-pay

## 2018-12-16 ENCOUNTER — Other Ambulatory Visit: Payer: Self-pay | Admitting: *Deleted

## 2018-12-16 DIAGNOSIS — Z006 Encounter for examination for normal comparison and control in clinical research program: Secondary | ICD-10-CM

## 2018-12-16 DIAGNOSIS — F330 Major depressive disorder, recurrent, mild: Secondary | ICD-10-CM

## 2018-12-16 DIAGNOSIS — F411 Generalized anxiety disorder: Secondary | ICD-10-CM

## 2018-12-16 MED ORDER — DULOXETINE HCL 20 MG PO CPEP: ORAL_CAPSULE | ORAL | 1 refills | Status: DC

## 2018-12-16 NOTE — Telephone Encounter (Signed)
Patient emailed this research nurse to move her study visit on 12/22/18 up earlier in the morning. Scheduling request sent to change appointment to 8:30 am for lab and 9 am for research nurse.  Foye Spurling, BSN, RN Clinical Research Nurse 12/16/2018 9:59 AM

## 2018-12-16 NOTE — Telephone Encounter (Signed)
Error

## 2018-12-16 NOTE — Telephone Encounter (Signed)
Patient was taking the Prozac and she states that she felt like she was going curl up in a ball and cry all day. She stopped the Prozac a few days ago and feels much better. Patient wants to know if she should continue on her other medications. Patient also had a Gene sight test done, it is in the chart under the media tab ( 08/11/2018) is the date it was scanned. Please review and advise, thank you

## 2018-12-16 NOTE — Telephone Encounter (Signed)
I returned patient's phone call.  She stopped the Effexor really was not recommended to do when she started Prozac.  She noticed having crying spells, more anxiety and depression.  I explained it could be due to withdrawal from Effexor and should not have stopped cold [place].  However now she like to try a different medication since Prozac did not work for her and she had stopped taking the Effexor.  I reviewed GeneSight testing results with her.  Prozac Effexor and Cymbalta is in favorable section.  She has never tried Cymbalta and I discussed with her to consider and she agreed with the plan.  We will start Cymbalta 20 mg daily for 1 week and then twice a day.  She will continue present dose of Abilify 2.5 mg.  She has stopped taking Effexor on her own and recently tried Prozac which she stopped due to side effects.  I encouraged that if she has any confusion about medication then she should call us back for medication dosage and directions.  She agreed with the plan.

## 2018-12-16 NOTE — Telephone Encounter (Signed)
Patient called regarding her

## 2018-12-17 ENCOUNTER — Other Ambulatory Visit: Payer: Self-pay | Admitting: *Deleted

## 2018-12-22 ENCOUNTER — Inpatient Hospital Stay: Payer: BC Managed Care – PPO

## 2018-12-22 ENCOUNTER — Other Ambulatory Visit: Payer: Self-pay

## 2018-12-22 ENCOUNTER — Inpatient Hospital Stay: Payer: BC Managed Care – PPO | Admitting: *Deleted

## 2018-12-22 VITALS — BP 110/63 | HR 70 | Temp 98.4°F | Resp 18 | Ht 70.25 in | Wt 195.3 lb

## 2018-12-22 DIAGNOSIS — C50411 Malignant neoplasm of upper-outer quadrant of right female breast: Secondary | ICD-10-CM | POA: Diagnosis not present

## 2018-12-22 DIAGNOSIS — Z5112 Encounter for antineoplastic immunotherapy: Secondary | ICD-10-CM | POA: Diagnosis not present

## 2018-12-22 DIAGNOSIS — Z170 Estrogen receptor positive status [ER+]: Secondary | ICD-10-CM

## 2018-12-22 DIAGNOSIS — E7800 Pure hypercholesterolemia, unspecified: Secondary | ICD-10-CM | POA: Diagnosis not present

## 2018-12-22 DIAGNOSIS — Z006 Encounter for examination for normal comparison and control in clinical research program: Secondary | ICD-10-CM

## 2018-12-22 LAB — CMP (CANCER CENTER ONLY)
ALT: 38 U/L (ref 0–44)
AST: 38 U/L (ref 15–41)
Albumin: 3.9 g/dL (ref 3.5–5.0)
Alkaline Phosphatase: 68 U/L (ref 38–126)
Anion gap: 9 (ref 5–15)
BUN: 14 mg/dL (ref 6–20)
CO2: 29 mmol/L (ref 22–32)
Calcium: 9.6 mg/dL (ref 8.9–10.3)
Chloride: 103 mmol/L (ref 98–111)
Creatinine: 0.99 mg/dL (ref 0.44–1.00)
GFR, Est AFR Am: >60 mL/min (ref >60)
GFR, Estimated: >60 mL/min (ref >60)
Glucose, Bld: 88 mg/dL (ref 70–99)
Potassium: 4.3 mmol/L (ref 3.5–5.1)
Sodium: 141 mmol/L (ref 135–145)
Total Bilirubin: 0.4 mg/dL (ref 0.3–1.2)
Total Protein: 7.4 g/dL (ref 6.5–8.1)

## 2018-12-22 LAB — CBC WITH DIFFERENTIAL (CANCER CENTER ONLY)
Abs Immature Granulocytes: 0 10*3/uL (ref 0.00–0.07)
Basophils Absolute: 0 10*3/uL (ref 0.0–0.1)
Basophils Relative: 0 %
Eosinophils Absolute: 0.1 10*3/uL (ref 0.0–0.5)
Eosinophils Relative: 4 %
HCT: 36.3 % (ref 36.0–46.0)
Hemoglobin: 12.2 g/dL (ref 12.0–15.0)
Immature Granulocytes: 0 %
Lymphocytes Relative: 34 %
Lymphs Abs: 1.2 10*3/uL (ref 0.7–4.0)
MCH: 30.5 pg (ref 26.0–34.0)
MCHC: 33.6 g/dL (ref 30.0–36.0)
MCV: 90.8 fL (ref 80.0–100.0)
Monocytes Absolute: 0.4 10*3/uL (ref 0.1–1.0)
Monocytes Relative: 11 %
Neutro Abs: 1.7 10*3/uL (ref 1.7–7.7)
Neutrophils Relative %: 51 %
Platelet Count: 121 10*3/uL — ABNORMAL LOW (ref 150–400)
RBC: 4 MIL/uL (ref 3.87–5.11)
RDW: 13.2 % (ref 11.5–15.5)
WBC Count: 3.4 10*3/uL — ABNORMAL LOW (ref 4.0–10.5)
nRBC: 0 % (ref 0.0–0.2)

## 2018-12-22 LAB — RESEARCH LABS

## 2018-12-22 NOTE — Research (Signed)
[id number] Upbeat 12 Months Visit:  Patient into clinic by herself this morning to complete activities for Upbeat study.  PROs; Given to patient to complete in lobby prior to lab appointment.  Research nurse collected and checked for completeness and accuracy. Patient clarified section C-4 that the check marks represent her answers. Patient scored 16 on section E depression screening, which indicated depression. She states she is currently under the care of a therapist and taking medication. She denies need to speak with one of our counselors. Dr. Lindi Adie notified.  Labs; Collected per protocol.  Patient confirmed she had been fasting for more than 8 hours.  VS; Collected per protocol after resting for 5 minutes.  Height and weight without shoes. See VS flowsheet Neurocognitive testing; Completed by Farris Has, research assistant.  Medication review; Reviewed medication list with patient and updated in EMR.  Waist measurement;  38 inches, measured per instructions on CRF14, page 1.  Physical Functions testing; Completed per protocol without difficulty and patient tolerated well.  Gift Card/ Tote Bag; $25 [employer] gift card and Upbeat tote bag given to patient for completing study activities for this time point. Plan;  Thanked patient for her participation in this study. Informed patient next study visit due in one year and will include cardiac MRI. Research nurse will contact patient aproximately 2 months in advance to schedule.  She verbalized understanding.  Foye Spurling, BSN, RN Clinical Research Nurse 12/22/2018 11:03 AM

## 2018-12-25 ENCOUNTER — Ambulatory Visit: Payer: Self-pay | Admitting: Surgery

## 2018-12-25 DIAGNOSIS — F4323 Adjustment disorder with mixed anxiety and depressed mood: Secondary | ICD-10-CM | POA: Diagnosis not present

## 2018-12-25 DIAGNOSIS — Z95828 Presence of other vascular implants and grafts: Secondary | ICD-10-CM | POA: Diagnosis not present

## 2018-12-25 DIAGNOSIS — Z853 Personal history of malignant neoplasm of breast: Secondary | ICD-10-CM | POA: Diagnosis not present

## 2018-12-25 NOTE — H&P (Signed)
Sandra [name] Documented: 12/25/2018 11:01 AM Location: [facility] Patient #: [id number] DOB: [birth year] Married / Language: Sandra [name] / Race: White Female  History of Present Illness Sandra Moores A. Petra Sargeant MD; 12/25/2018 12:50 PM) Patient words: Patient returns for follow-up of her right breast cancer. She has completed chemotherapy once her port removed. She has done well with treatment and she has no complaints.  The patient is a 53 year old female.   Allergies Sandra [name], [place]; 12/25/2018 11:01 AM) No Known Allergies [11/17/2017]: No Known Drug Allergies [04/06/2018]: Allergies Reconciled  Medication History Sandra [name], CMA; 12/25/2018 11:06 AM) clonazePAM (0.5MG  Tablet, Oral) Active. Anastrozole (1MG  Tablet, Oral) Active. ARIPiprazole (5MG  Tablet, Oral) Active. DULoxetine HCl (20MG  Capsule DR Part, Oral) Active. Biotin (5MG  Tablet, Oral) Active. Medications Reconciled    Vitals Sandra [name] CMA; 12/25/2018 11:01 AM) 12/25/2018 11:01 AM Weight: 197 lb Height: 70in Body Surface Area: 2.07 m Body Mass Index: 28.27 kg/m  Temp.: 97.41F  Pulse: 111 (Regular)  BP: 118/78 (Sitting, Left Arm, Standard)        Physical Exam (Sandra Digiulio A. Garry Nicolini MD; 12/25/2018 12:51 PM)  General Mental Status-Alert. General Appearance-Consistent with stated age. Hydration-Well hydrated. Voice-Normal.  Head and Neck Head-normocephalic, atraumatic with no lesions or palpable masses. Trachea-midline. Thyroid Gland Characteristics - normal size and consistency.  Eye Eyeball - Bilateral-Extraocular movements intact. Sclera/Conjunctiva - Bilateral-No scleral icterus.  Breast Note: Right-sided port intact. Postoperative changes noted right breast without significant mass lesion or abnormality. Mild volume loss right upper quadrant.  Lymphatic Head & Neck  General Head & Neck Lymphatics: Bilateral - Description -  Normal. Axillary  General Axillary Region: Bilateral - Description - Normal. Tenderness - Non Tender.    Assessment & Plan (Sandra Topel A. Saron Vanorman MD; 12/25/2018 12:51 PM)  HISTORY OF BREAST CANCER (Z85.3) Impression: Stable without acute disease.   PORT-A-CATH IN PLACE [phone]) Impression: Scheduled for port removal. Risk of bleeding, infection, catheter migration, catheter embolization, clot formation, organ injury, death, and wound complications discussed  Current Plans I recommended surgery to remove the catheter. I explained the technique of removal with use of local anesthesia & possible need for more aggressive sedation/anesthesia for patient comfort.  Risks such as bleeding, infection, and other risks were discussed. Post-operative dressing/incision care was discussed. I noted a good likelihood this will help address the problem. We will work to minimize complications. Questions were answered. The patient expresses understanding & wishes to proceed with surgery.  Pt Education - [facility] Free Text Education/Instructions: discussed with patient and provided information.

## 2019-01-08 DIAGNOSIS — F4323 Adjustment disorder with mixed anxiety and depressed mood: Secondary | ICD-10-CM | POA: Diagnosis not present

## 2019-01-12 ENCOUNTER — Encounter (HOSPITAL_COMMUNITY): Payer: Self-pay | Admitting: Psychiatry

## 2019-01-12 ENCOUNTER — Other Ambulatory Visit (HOSPITAL_COMMUNITY): Payer: Self-pay | Admitting: Psychiatry

## 2019-01-12 ENCOUNTER — Ambulatory Visit (INDEPENDENT_AMBULATORY_CARE_PROVIDER_SITE_OTHER): Payer: BC Managed Care – PPO | Admitting: Psychiatry

## 2019-01-12 DIAGNOSIS — F411 Generalized anxiety disorder: Secondary | ICD-10-CM | POA: Diagnosis not present

## 2019-01-12 DIAGNOSIS — F330 Major depressive disorder, recurrent, mild: Secondary | ICD-10-CM

## 2019-01-12 MED ORDER — DULOXETINE HCL 30 MG PO CPEP: ORAL_CAPSULE | ORAL | 1 refills | Status: DC

## 2019-01-12 MED ORDER — ARIPIPRAZOLE 5 MG PO TABS: 2.5000 mg | ORAL_TABLET | Freq: Every day | ORAL | 0 refills | Status: DC

## 2019-01-12 NOTE — Progress Notes (Signed)
Virtual Visit via Telephone Note  I connected with Sandra [name] on 01/12/19 at  8:40 AM EDT by telephone and verified that I am speaking with the correct person using two identifiers.   I discussed the limitations, risks, security and privacy concerns of performing an evaluation and management service by telephone and the availability of in person appointments. I also discussed with the patient that there may be a patient responsible charge related to this service. The patient expressed understanding and agreed to proceed.   History of Present Illness: Patient was evaluated by phone session.  She is now taking Cymbalta 20 mg twice a day.  On her last visit we recommended to try Prozac and continue her Effexor.  He also recommend to reduce Abilify from 5 mg to 2.5 mg.  Patient called few weeks later stating that Prozac is causing more crying spells but she also had stopped the Effexor which was not recommended.  She wanted to try a different medication and restart Cymbalta 20 mg twice a day.  She admitted Cymbalta help somewhat as she is no longer having crying spells but she still have fatigue, lack of motivation to do things.  She is taking Abilify 2.5 mg but she is not taking Klonopin because it was making her very sleepy.  She still sleeps at least 9 hours a day.  Now she is looking for a job to keep her self busy.  She is tolerating Cymbalta without any side effects.  She denies any feeling of hopelessness or worthlessness.  She denies any suicidal thoughts but her biggest concern is lack of motivation, fatigue.  Her energy level is fair.  We have reviewed GeneSight testing results with her.  She is no longer getting any infusion.  She lives with her husband who is very supportive and 63 year old daughter who started virtual schooling.  She admitted some stress because of that but she is handling better than she anticipated.  She denies drinking or using any illegal substances.  Past Psychiatric  History:[facility] Tried Paxil, Lexapro, ativan and trazadonebut don'tremember. Lamictal did not help and Wellbutrin causeddepression. We tried Effexor but stopped when switch to Prozac.  Prozac did not help.  No h/opsychosis, suicidal attempt or Inpatient.   Psychiatric Specialty Exam: Physical Exam  ROS  There were no vitals taken for this visit.There is no height or weight on file to calculate BMI.  General Appearance: NA  Eye Contact:  NA  Speech:  Clear and Coherent  Volume:  Normal  Mood:  Anxious and tired  Affect:  NA  Thought Process:  Goal Directed  Orientation:  Full (Time, Place, and Person)  Thought Content:  WDL and Logical  Suicidal Thoughts:  No  Homicidal Thoughts:  No  Memory:  Immediate;   Good Recent;   Good Remote;   Good  Judgement:  Good  Insight:  Present  Psychomotor Activity:  NA  Concentration:  Concentration: Good and Attention Span: Good  Recall:  Good  Fund of Knowledge:  Good  Language:  Good  Akathisia:  No  Handed:  Right  AIMS (if indicated):     Assets:  Communication Skills Desire for Improvement Housing Resilience Social Support  ADL's:  Intact  Cognition:  WNL  Sleep:   9 hours      Assessment and Plan: Major depressive disorder, recurrent.  Generalized anxiety disorder.  Patient is now taking Cymbalta 20 mg twice a day.  I recommend to  try increasing Cymbalta 30 mg twice a day.  Patient is not taking Klonopin because she is sleeping 9 hours.  I recommend to continue Abilify 2.5 mg for now and we will consider taking her off on her next appointment.  Patient still have residual anxiety, depression but her biggest concern is lack of motivation and fatigue.  She is hoping to find a job to keep herself busy.  Encouraged to continue therapy with Nehemiah Massed.  Discussed medication side effects and benefits.  Patient has no tremors, shakes or any EPS.  Recommended to call us back if she has  any question or any concern.  Follow-up in 6 weeks.  Follow Up Instructions:    I discussed the assessment and treatment plan with the patient. The patient was provided an opportunity to ask questions and all were answered. The patient agreed with the plan and demonstrated an understanding of the instructions.   The patient was advised to call back or seek an in-person evaluation if the symptoms worsen or if the condition fails to improve as anticipated.  I provided 20 minutes of non-face-to-face time during this encounter.   Kathlee Nations, MD

## 2019-01-20 ENCOUNTER — Encounter (HOSPITAL_BASED_OUTPATIENT_CLINIC_OR_DEPARTMENT_OTHER): Payer: Self-pay | Admitting: *Deleted

## 2019-01-20 ENCOUNTER — Other Ambulatory Visit: Payer: Self-pay

## 2019-01-23 ENCOUNTER — Other Ambulatory Visit (HOSPITAL_COMMUNITY)
Admission: RE | Admit: 2019-01-23 | Discharge: 2019-01-23 | Disposition: A | Discharge status: Home / Self Care | Payer: BC Managed Care – PPO | Source: Ambulatory Visit | Attending: Surgery | Admitting: Surgery

## 2019-01-23 DIAGNOSIS — Z20828 Contact with and (suspected) exposure to other viral communicable diseases: Secondary | ICD-10-CM | POA: Diagnosis not present

## 2019-01-23 DIAGNOSIS — Z01812 Encounter for preprocedural laboratory examination: Secondary | ICD-10-CM | POA: Insufficient documentation

## 2019-01-24 LAB — NOVEL CORONAVIRUS, NAA (HOSP ORDER, SEND-OUT TO REF LAB; TAT 18-24 HRS): SARS-CoV-2, NAA: NOT DETECTED

## 2019-01-27 ENCOUNTER — Ambulatory Visit (HOSPITAL_BASED_OUTPATIENT_CLINIC_OR_DEPARTMENT_OTHER): Payer: BC Managed Care – PPO | Admitting: Anesthesiology

## 2019-01-27 ENCOUNTER — Other Ambulatory Visit: Payer: Self-pay

## 2019-01-27 ENCOUNTER — Encounter (HOSPITAL_BASED_OUTPATIENT_CLINIC_OR_DEPARTMENT_OTHER)
Admission: RE | Disposition: A | Discharge status: Home / Self Care | Payer: Self-pay | Source: Home / Self Care | Attending: Surgery

## 2019-01-27 ENCOUNTER — Ambulatory Visit (HOSPITAL_BASED_OUTPATIENT_CLINIC_OR_DEPARTMENT_OTHER)
Admission: RE | Admit: 2019-01-27 | Discharge: 2019-01-27 | Disposition: A | Discharge status: Home / Self Care | Payer: BC Managed Care – PPO | Attending: Surgery | Admitting: Surgery

## 2019-01-27 DIAGNOSIS — Z452 Encounter for adjustment and management of vascular access device: Secondary | ICD-10-CM | POA: Diagnosis not present

## 2019-01-27 DIAGNOSIS — Z170 Estrogen receptor positive status [ER+]: Secondary | ICD-10-CM | POA: Diagnosis not present

## 2019-01-27 DIAGNOSIS — F329 Major depressive disorder, single episode, unspecified: Secondary | ICD-10-CM | POA: Diagnosis not present

## 2019-01-27 DIAGNOSIS — C50411 Malignant neoplasm of upper-outer quadrant of right female breast: Secondary | ICD-10-CM | POA: Diagnosis not present

## 2019-01-27 DIAGNOSIS — E7849 Other hyperlipidemia: Secondary | ICD-10-CM | POA: Diagnosis not present

## 2019-01-27 DIAGNOSIS — E559 Vitamin D deficiency, unspecified: Secondary | ICD-10-CM | POA: Diagnosis not present

## 2019-01-27 DIAGNOSIS — Z79899 Other long term (current) drug therapy: Secondary | ICD-10-CM | POA: Insufficient documentation

## 2019-01-27 DIAGNOSIS — E7800 Pure hypercholesterolemia, unspecified: Secondary | ICD-10-CM | POA: Diagnosis not present

## 2019-01-27 DIAGNOSIS — Z79811 Long term (current) use of aromatase inhibitors: Secondary | ICD-10-CM | POA: Diagnosis not present

## 2019-01-27 HISTORY — PX: PORT-A-CATH REMOVAL: SHX5289

## 2019-01-27 SURGERY — REMOVAL PORT-A-CATH
Anesthesia: Monitor Anesthesia Care | Site: Chest | Laterality: Right

## 2019-01-27 MED ORDER — ONDANSETRON HCL 4 MG/2ML IJ SOLN
INTRAMUSCULAR | Status: DC | PRN
  Administered 2019-01-27: 4 mg via INTRAVENOUS

## 2019-01-27 MED ORDER — PROMETHAZINE HCL 25 MG/ML IJ SOLN: 6.2500 mg | INTRAMUSCULAR | Status: DC | PRN

## 2019-01-27 MED ORDER — IBUPROFEN 800 MG PO TABS: 800.0000 mg | ORAL_TABLET | Freq: Three times a day (TID) | ORAL | 0 refills | Status: DC | PRN

## 2019-01-27 MED ORDER — CHLORHEXIDINE GLUCONATE CLOTH 2 % EX PADS: 6.0000 | MEDICATED_PAD | Freq: Once | CUTANEOUS | Status: DC

## 2019-01-27 MED ORDER — PROPOFOL 10 MG/ML IV BOLUS
INTRAVENOUS | Status: DC | PRN
  Administered 2019-01-27: 30 mg via INTRAVENOUS
  Administered 2019-01-27: 20 mg via INTRAVENOUS

## 2019-01-27 MED ORDER — MIDAZOLAM HCL 2 MG/2ML IJ SOLN
INTRAMUSCULAR | Status: DC | PRN
  Administered 2019-01-27: 2 mg via INTRAVENOUS

## 2019-01-27 MED ORDER — PROPOFOL 500 MG/50ML IV EMUL
INTRAVENOUS | Status: DC | PRN
  Administered 2019-01-27: 25 ug/kg/min via INTRAVENOUS

## 2019-01-27 MED ORDER — MEPERIDINE HCL 25 MG/ML IJ SOLN: 6.2500 mg | INTRAMUSCULAR | Status: DC | PRN

## 2019-01-27 MED ORDER — ACETAMINOPHEN 500 MG PO TABS
ORAL_TABLET | ORAL | Status: AC
  Filled 2019-01-27: qty 2

## 2019-01-27 MED ORDER — LIDOCAINE HCL (CARDIAC) PF 100 MG/5ML IV SOSY
PREFILLED_SYRINGE | INTRAVENOUS | Status: DC | PRN
  Administered 2019-01-27: 60 mg via INTRATRACHEAL

## 2019-01-27 MED ORDER — MIDAZOLAM HCL 2 MG/2ML IJ SOLN
INTRAMUSCULAR | Status: AC
  Filled 2019-01-27: qty 2

## 2019-01-27 MED ORDER — OXYCODONE HCL 5 MG PO TABS: 5.0000 mg | ORAL_TABLET | Freq: Once | ORAL | Status: DC | PRN

## 2019-01-27 MED ORDER — GABAPENTIN 300 MG PO CAPS
300.0000 mg | ORAL_CAPSULE | ORAL | Status: AC
  Administered 2019-01-27: 300 mg via ORAL

## 2019-01-27 MED ORDER — CELECOXIB 200 MG PO CAPS
ORAL_CAPSULE | ORAL | Status: AC
  Filled 2019-01-27: qty 1

## 2019-01-27 MED ORDER — FENTANYL CITRATE (PF) 100 MCG/2ML IJ SOLN
INTRAMUSCULAR | Status: AC
  Filled 2019-01-27: qty 2

## 2019-01-27 MED ORDER — LACTATED RINGERS IV SOLN
INTRAVENOUS | Status: DC
  Administered 2019-01-27 (×2): via INTRAVENOUS

## 2019-01-27 MED ORDER — GABAPENTIN 300 MG PO CAPS
ORAL_CAPSULE | ORAL | Status: AC
  Filled 2019-01-27: qty 1

## 2019-01-27 MED ORDER — CELECOXIB 200 MG PO CAPS
200.0000 mg | ORAL_CAPSULE | ORAL | Status: AC
  Administered 2019-01-27: 200 mg via ORAL

## 2019-01-27 MED ORDER — ACETAMINOPHEN 500 MG PO TABS
1000.0000 mg | ORAL_TABLET | ORAL | Status: AC
  Administered 2019-01-27: 1000 mg via ORAL

## 2019-01-27 MED ORDER — FENTANYL CITRATE (PF) 100 MCG/2ML IJ SOLN
INTRAMUSCULAR | Status: DC | PRN
  Administered 2019-01-27 (×2): 50 ug via INTRAVENOUS

## 2019-01-27 MED ORDER — CEFAZOLIN SODIUM-DEXTROSE 2-4 GM/100ML-% IV SOLN
2.0000 g | INTRAVENOUS | Status: AC
  Administered 2019-01-27: 2 g via INTRAVENOUS

## 2019-01-27 MED ORDER — FENTANYL CITRATE (PF) 100 MCG/2ML IJ SOLN: 25.0000 ug | INTRAMUSCULAR | Status: DC | PRN

## 2019-01-27 MED ORDER — OXYCODONE HCL 5 MG/5ML PO SOLN: 5.0000 mg | Freq: Once | ORAL | Status: DC | PRN

## 2019-01-27 MED ORDER — PROPOFOL 500 MG/50ML IV EMUL
INTRAVENOUS | Status: AC
  Filled 2019-01-27: qty 50

## 2019-01-27 MED ORDER — BUPIVACAINE HCL (PF) 0.25 % IJ SOLN
INTRAMUSCULAR | Status: DC | PRN
  Administered 2019-01-27: 10 mL

## 2019-01-27 MED ORDER — CEFAZOLIN SODIUM-DEXTROSE 2-4 GM/100ML-% IV SOLN
INTRAVENOUS | Status: AC
  Filled 2019-01-27: qty 100

## 2019-01-27 SURGICAL SUPPLY — 35 items
ADH SKN CLS APL DERMABOND .7 (GAUZE/BANDAGES/DRESSINGS) ×1
APL PRP STRL LF DISP 70% ISPRP (MISCELLANEOUS) ×1
APL SKNCLS STERI-STRIP NONHPOA (GAUZE/BANDAGES/DRESSINGS)
BENZOIN TINCTURE PRP APPL 2/3 (GAUZE/BANDAGES/DRESSINGS) IMPLANT
BLADE SURG 15 STRL LF DISP TIS (BLADE) ×1 IMPLANT
BLADE SURG 15 STRL SS (BLADE) ×2
CHLORAPREP W/TINT 26 (MISCELLANEOUS) ×2 IMPLANT
COVER BACK TABLE REUSABLE LG (DRAPES) ×2 IMPLANT
COVER MAYO STAND REUSABLE (DRAPES) ×2 IMPLANT
DECANTER SPIKE VIAL GLASS SM (MISCELLANEOUS) ×2 IMPLANT
DERMABOND ADVANCED (GAUZE/BANDAGES/DRESSINGS) ×1
DERMABOND ADVANCED .7 DNX12 (GAUZE/BANDAGES/DRESSINGS) ×1 IMPLANT
DRAPE LAPAROTOMY 100X72 PEDS (DRAPES) ×2 IMPLANT
DRAPE UTILITY XL STRL (DRAPES) ×2 IMPLANT
ELECT REM PT RETURN 9FT ADLT (ELECTROSURGICAL) ×2
ELECTRODE REM PT RTRN 9FT ADLT (ELECTROSURGICAL) ×1 IMPLANT
GLOVE BIO SURGEON STRL SZ 6.5 (GLOVE) ×1 IMPLANT
GLOVE BIOGEL PI IND STRL 7.0 (GLOVE) IMPLANT
GLOVE BIOGEL PI IND STRL 8 (GLOVE) ×1 IMPLANT
GLOVE BIOGEL PI INDICATOR 7.0 (GLOVE) ×1
GLOVE BIOGEL PI INDICATOR 8 (GLOVE) ×1
GLOVE ECLIPSE 8.0 STRL XLNG CF (GLOVE) ×2 IMPLANT
GOWN STRL REUS W/ TWL LRG LVL3 (GOWN DISPOSABLE) ×2 IMPLANT
GOWN STRL REUS W/TWL LRG LVL3 (GOWN DISPOSABLE) ×4
NDL HYPO 25X1 1.5 SAFETY (NEEDLE) ×1 IMPLANT
NEEDLE HYPO 25X1 1.5 SAFETY (NEEDLE) ×2 IMPLANT
NS IRRIG 1000ML POUR BTL (IV SOLUTION) ×2 IMPLANT
PACK BASIN DAY SURGERY FS (CUSTOM PROCEDURE TRAY) ×2 IMPLANT
SLEEVE SCD COMPRESS KNEE MED (MISCELLANEOUS) IMPLANT
SPONGE LAP 4X18 RFD (DISPOSABLE) ×2 IMPLANT
STRIP CLOSURE SKIN 1/2X4 (GAUZE/BANDAGES/DRESSINGS) IMPLANT
SUT MON AB 4-0 PC3 18 (SUTURE) ×2 IMPLANT
SUT VICRYL 3-0 CR8 SH (SUTURE) ×2 IMPLANT
SYR CONTROL 10ML LL (SYRINGE) ×2 IMPLANT
TOWEL GREEN STERILE FF (TOWEL DISPOSABLE) ×2 IMPLANT

## 2019-01-27 NOTE — Discharge Instructions (Signed)
GENERAL SURGERY: POST OP INSTRUCTIONS  ######################################################################  EAT Gradually transition to a high fiber diet with a fiber supplement over the next few weeks after discharge.  Start with a pureed / full liquid diet (see below)  WALK Walk an hour a day.  Control your pain to do that.    CONTROL PAIN Control pain so that you can walk, sleep, tolerate sneezing/coughing, go up/down stairs.  HAVE A BOWEL MOVEMENT DAILY Keep your bowels regular to avoid problems.  OK to try a laxative to override constipation.  OK to use an antidairrheal to slow down diarrhea.  Call if not better after 2 tries  CALL IF YOU HAVE PROBLEMS/CONCERNS Call if you are still struggling despite following these instructions. Call if you have concerns not answered by these instructions  ######################################################################    1. DIET: Follow a light bland diet & liquids the first 24 hours after arrival home, such as soup, liquids, starches, etc.  Be sure to drink plenty of fluids.  Quickly advance to a usual solid diet within a few days.  Avoid fast food or heavy meals as your are more likely to get nauseated or have irregular bowels.  A low-fat, high-fiber diet for the rest of your life is ideal.   2. Take your usually prescribed home medications unless otherwise directed. 3. PAIN CONTROL: No Tylenol or Ibuprofen until 5:00pm if needed a. Pain is best controlled by a usual combination of three different methods TOGETHER: i. Ice/Heat ii. Over the counter pain medication iii. Prescription pain medication b. Most patients will experience some swelling and bruising around the incisions.  Ice packs or heating pads (30-60 minutes up to 6 times a day) will help. Use ice for the first few days to help decrease swelling and bruising, then switch to heat to help relax tight/sore spots and speed recovery.  Some people prefer to use ice alone, heat  alone, alternating between ice & heat.  Experiment to what works for you.  Swelling and bruising can take several weeks to resolve.   c. It is helpful to take an over-the-counter pain medication regularly for the first few weeks.  Choose one of the following that works best for you: i. Naproxen (Aleve, etc)  Two 220mg  tabs twice a day ii. Ibuprofen (Advil, etc) Three 200mg  tabs four times a day (every meal & bedtime) iii. Acetaminophen (Tylenol, etc) 500-650mg  four times a day (every meal & bedtime) d. A  prescription for pain medication (such as oxycodone, hydrocodone, etc) should be given to you upon discharge.  Take your pain medication as prescribed.  i. If you are having problems/concerns with the prescription medicine (does not control pain, nausea, vomiting, rash, itching, etc), please call us [phone] to see if we need to switch you to a different pain medicine that will work better for you and/or control your side effect better. ii. If you need a refill on your pain medication, please contact your pharmacy.  They will contact our office to request authorization. Prescriptions will not be filled after 5 pm or on week-ends. 4. Avoid getting constipated.  Between the surgery and the pain medications, it is common to experience some constipation.  Increasing fluid intake and taking a fiber supplement (such as Metamucil, Citrucel, FiberCon, MiraLax, etc) 1-2 times a day regularly will usually help prevent this problem from occurring.  A mild laxative (prune juice, Milk of Magnesia, MiraLax, etc) should be taken according to package directions if there are no bowel movements  after 48 hours.   5. Wash / shower every day.  You may shower over the dressings as they are waterproof.  Continue to shower over incision(s) after the dressing is off. 6. Remove your waterproof bandages 5 days after surgery.  You may leave the incision open to air.  You may have skin tapes (Steri Strips) covering the  incision(s).  Leave them on until one week, then remove.  You may replace a dressing/Band-Aid to cover the incision for comfort if you wish.      7. ACTIVITIES as tolerated:   a. You may resume regular (light) daily activities beginning the next day--such as daily self-care, walking, climbing stairs--gradually increasing activities as tolerated.  If you can walk 30 minutes without difficulty, it is safe to try more intense activity such as jogging, treadmill, bicycling, low-impact aerobics, swimming, etc. b. Save the most intensive and strenuous activity for last such as sit-ups, heavy lifting, contact sports, etc  Refrain from any heavy lifting or straining until you are off narcotics for pain control.   c. DO NOT PUSH THROUGH PAIN.  Let pain be your guide: If it hurts to do something, don't do it.  Pain is your body warning you to avoid that activity for another week until the pain goes down. d. You may drive when you are no longer taking prescription pain medication, you can comfortably wear a seatbelt, and you can safely maneuver your car and apply brakes. e. Dennis Bast may have sexual intercourse when it is comfortable.  8. FOLLOW UP in our office a. Please call [facility] at [phone] to set up an appointment to see your surgeon in the office for a follow-up appointment approximately 2-3 weeks after your surgery. b. Make sure that you call for this appointment the day you arrive home to insure a convenient appointment time. 9. IF YOU HAVE DISABILITY OR FAMILY LEAVE FORMS, BRING THEM TO THE OFFICE FOR PROCESSING.  DO NOT GIVE THEM TO YOUR DOCTOR.   WHEN TO CALL us [phone]: 1. Poor pain control 2. Reactions / problems with new medications (rash/itching, nausea, etc)  3. Fever over 101.5 F (38.5 C) 4. Worsening swelling or bruising 5. Continued bleeding from incision. 6. Increased pain, redness, or drainage from the incision 7. Difficulty breathing / swallowing   The clinic staff is  available to answer your questions during regular business hours (8:30am-5pm).  Please dont hesitate to call and ask to speak to one of our nurses for clinical concerns.   If you have a medical emergency, go to the nearest emergency room or call 911.  A surgeon from [facility] is always on call at the [facility], [place], [place], [place], [place]  [address] ? MAIN: [phone] ? TOLL FREE: [phone] ?  FAX [phone] [email or web address]   Post Anesthesia Home Care Instructions  Activity: Get plenty of rest for the remainder of the day. A responsible individual must stay with you for 24 hours following the procedure.  For the next 24 hours, DO NOT: -Drive a car -Paediatric nurse -Drink alcoholic beverages -Take any medication unless instructed by your physician -Make any legal decisions or sign important papers.  Meals: Start with liquid foods such as gelatin or soup. Progress to regular foods as tolerated. Avoid greasy, spicy, heavy foods. If nausea and/or vomiting occur, drink only clear liquids until the nausea and/or vomiting subsides. Call your physician if vomiting continues.  Special  Instructions/Symptoms: Your throat may feel dry or sore from the anesthesia or the breathing tube placed in your throat during surgery. If this causes discomfort, gargle with warm salt water. The discomfort should disappear within 24 hours.  If you had a scopolamine patch placed behind your ear for the management of post- operative nausea and/or vomiting:  1. The medication in the patch is effective for 72 hours, after which it should be removed.  Wrap patch in a tissue and discard in the trash. Wash hands thoroughly with soap and water. 2. You may remove the patch earlier than 72 hours if you experience unpleasant side effects which may include dry mouth, dizziness or visual disturbances. 3. Avoid touching the patch.  Wash your hands with soap and water after contact with the patch.

## 2019-01-27 NOTE — Interval H&P Note (Signed)
History and Physical Interval Note:  01/27/2019 10:58 AM  Sandra [name]  has presented today for surgery, with the diagnosis of PORT IN PLACE.  The various methods of treatment have been discussed with the patient and family. After consideration of risks, benefits and other options for treatment, the patient has consented to  Procedure(s): PORT REMOVAL (N/A) as a surgical intervention.  The patient's history has been reviewed, patient examined, no change in status, stable for surgery.  I have reviewed the patient's chart and labs.  Questions were answered to the patient's satisfaction.     [place]

## 2019-01-27 NOTE — Anesthesia Preprocedure Evaluation (Signed)
Anesthesia Evaluation  Patient identified by MRN, date of birth, ID band Patient awake    Reviewed: Allergy & Precautions, NPO status , Patient's Chart, lab work & pertinent test results  Airway Mallampati: II  TM Distance: >3 FB Neck ROM: Full    Dental no notable dental hx.    Pulmonary neg pulmonary ROS,    Pulmonary exam normal breath sounds clear to auscultation       Cardiovascular negative cardio ROS Normal cardiovascular exam Rhythm:Regular Rate:Normal  ECHO: Left ventricle: The cavity size was normal. Systolic function was normal. The estimated ejection fraction was in the range of 55% to 60%. Wall motion was normal; there were no regional wall motion abnormalities. Left ventricular diastolic function parameters were normal.    Neuro/Psych  Headaches, PSYCHIATRIC DISORDERS Depression    GI/Hepatic negative GI ROS, Neg liver ROS,   Endo/Other  negative endocrine ROS  Renal/GU negative Renal ROS     Musculoskeletal negative musculoskeletal ROS (+)   Abdominal (+) + obese,   Peds  Hematology HLD   Anesthesia Other Findings breast cancer  Reproductive/Obstetrics                             Anesthesia Physical  Anesthesia Plan  ASA: III  Anesthesia Plan: MAC   Post-op Pain Management:  Regional for Post-op pain   Induction: Intravenous  PONV Risk Score and Plan: 3 and Ondansetron, Dexamethasone, Midazolam and Treatment may vary due to age or medical condition  Airway Management Planned: Natural Airway  Additional Equipment:   Intra-op Plan:   Post-operative Plan:   Informed Consent: I have reviewed the patients History and Physical, chart, labs and discussed the procedure including the risks, benefits and alternatives for the proposed anesthesia with the patient or authorized representative who has indicated his/her understanding and acceptance.     Dental advisory  given  Plan Discussed with: CRNA  Anesthesia Plan Comments:         Anesthesia Quick Evaluation

## 2019-01-27 NOTE — Anesthesia Postprocedure Evaluation (Signed)
Anesthesia Post Note  Patient: Sandra [name]  Procedure(s) Performed: PORT REMOVAL (Right Chest)     Patient location during evaluation: PACU Anesthesia Type: MAC Level of consciousness: awake and alert Pain management: pain level controlled Vital Signs Assessment: post-procedure vital signs reviewed and stable Respiratory status: spontaneous breathing Cardiovascular status: stable Anesthetic complications: no    Last Vitals:  Vitals:   01/27/19 1245 01/27/19 1315  BP: 99/63 115/69  Pulse: 64 65  Resp: 13 16  Temp:  36.9 C  SpO2: 98% 97%    Last Pain:  Vitals:   01/27/19 1315  TempSrc:   PainSc: 0-No pain                 Nolon Nations

## 2019-01-27 NOTE — H&P (Signed)
Sandra [name] Documented: 12/25/2018 11:01 AM Location: [facility] Patient #: [id number] DOB: [birth year] Married / Language: Sandra [name] / Race: White Female  History of Present Illness [name] A. [name] MD; 12/25/2018 12:50 PM) Patient words: Patient returns for follow-up of her right breast cancer. She has completed chemotherapy once her port removed. She has done well with treatment and she has no complaints.  The patient is a 53 year old female.   Allergies [name], [place]; 12/25/2018 11:01 AM) No Known Allergies [11/17/2017]: No Known Drug Allergies [04/06/2018]: Allergies Reconciled  Medication History [name], CMA; 12/25/2018 11:06 AM) clonazePAM (0.5MG  Tablet, Oral) Active. Anastrozole (1MG  Tablet, Oral) Active. ARIPiprazole (5MG  Tablet, Oral) Active. DULoxetine HCl (20MG  Capsule DR Part, Oral) Active. Biotin (5MG  Tablet, Oral) Active. Medications Reconciled    Vitals [name] CMA; 12/25/2018 11:01 AM) 12/25/2018 11:01 AM Weight: 197 lb Height: 70in Body Surface Area: 2.07 m Body Mass Index: 28.27 kg/m  Temp.: 97.52F  Pulse: 111 (Regular)  BP: 118/78 (Sitting, Left Arm, Standard)        Physical Exam ([name] A. Siris Hoos MD; 12/25/2018 12:51 PM)  General Mental Status-Alert. General Appearance-Consistent with stated age. Hydration-Well hydrated. Voice-Normal.  Head and Neck Head-normocephalic, atraumatic with no lesions or palpable masses. Trachea-midline. Thyroid Gland Characteristics - normal size and consistency.  Eye Eyeball - Bilateral-Extraocular movements intact. Sclera/Conjunctiva - Bilateral-No scleral icterus.  Breast Note: Right-sided port intact. Postoperative changes noted right breast without significant mass lesion or abnormality. Mild volume loss right upper quadrant.  Lymphatic Head & Neck  General Head & Neck Lymphatics: Bilateral -  Description - Normal. Axillary  General Axillary Region: Bilateral - Description - Normal. Tenderness - Non Tender.    Assessment & Plan ([name] A. Amaria Mundorf MD; 12/25/2018 12:51 PM)  HISTORY OF BREAST CANCER (Z85.3) Impression: Stable without acute disease.   PORT-A-CATH IN PLACE [id number]) Impression: Scheduled for port removal. Risk of bleeding, infection, catheter migration, catheter embolization, clot formation, organ injury, death, and wound complications discussed  Current Plans I recommended surgery to remove the catheter. I explained the technique of removal with use of local anesthesia & possible need for more aggressive sedation/anesthesia for patient comfort.  Risks such as bleeding, infection, and other risks were discussed. Post-operative dressing/incision care was discussed. I noted a good likelihood this will help address the problem. We will work to minimize complications. Questions were answered. The patient expresses understanding & wishes to proceed with surgery.  Pt Education - C[facility] Free Text Education/Instructions: discussed with patient and provided information.

## 2019-01-27 NOTE — Op Note (Signed)

## 2019-01-27 NOTE — Transfer of Care (Signed)
Immediate Anesthesia Transfer of Care Note  Patient: Sandra [name]  Procedure(s) Performed: PORT REMOVAL (Right Chest)  Patient Location: PACU  Anesthesia Type:MAC  Level of Consciousness: awake, alert  and oriented  Airway & Oxygen Therapy: Patient Spontanous Breathing and Patient connected to face mask oxygen  Post-op Assessment: Report given to RN, Post -op Vital signs reviewed and stable, Patient moving all extremities X 4 and Patient able to stick tongue midline  Post vital signs: Reviewed and stable  Last Vitals:  Vitals Value Taken Time  BP 81/53 01/27/19 1230  Temp    Pulse 71 01/27/19 1231  Resp 10 01/27/19 1231  SpO2 100 % 01/27/19 1231  Vitals shown include unvalidated device data.  Last Pain:  Vitals:   01/27/19 1101  TempSrc: Oral  PainSc: 0-No pain      Patients Stated Pain Goal: 6 (03/83/33 8329)  Complications: No apparent anesthesia complications

## 2019-01-28 ENCOUNTER — Encounter (HOSPITAL_BASED_OUTPATIENT_CLINIC_OR_DEPARTMENT_OTHER): Payer: Self-pay | Admitting: Surgery

## 2019-01-29 ENCOUNTER — Ambulatory Visit: Payer: BLUE CROSS/BLUE SHIELD | Admitting: Obstetrics and Gynecology

## 2019-01-29 DIAGNOSIS — F4323 Adjustment disorder with mixed anxiety and depressed mood: Secondary | ICD-10-CM | POA: Diagnosis not present

## 2019-02-10 ENCOUNTER — Ambulatory Visit: Payer: BC Managed Care – PPO | Admitting: Obstetrics and Gynecology

## 2019-02-10 ENCOUNTER — Other Ambulatory Visit (HOSPITAL_COMMUNITY): Payer: Self-pay | Admitting: Psychiatry

## 2019-02-10 ENCOUNTER — Encounter: Payer: Self-pay | Admitting: Obstetrics and Gynecology

## 2019-02-10 ENCOUNTER — Other Ambulatory Visit: Payer: Self-pay

## 2019-02-10 VITALS — BP 112/70 | HR 76 | Temp 97.8°F | Resp 14 | Ht 70.0 in | Wt 199.6 lb

## 2019-02-10 DIAGNOSIS — F411 Generalized anxiety disorder: Secondary | ICD-10-CM

## 2019-02-10 DIAGNOSIS — F330 Major depressive disorder, recurrent, mild: Secondary | ICD-10-CM

## 2019-02-10 DIAGNOSIS — Z01419 Encounter for gynecological examination (general) (routine) without abnormal findings: Secondary | ICD-10-CM

## 2019-02-10 NOTE — Progress Notes (Signed)
53 y.o. G72P2002 Married Caucasian female here for annual exam.    Considering a new psychiatrist.  Would like some suggestions for this.   Having some razor blade pain in her right breast - nipple area and the breast in general.  Pain lasted 2 days and is now gone.  Had her port removed on 01/27/19.  FU in December with oncology.   Some hot flashes.   Reports her lichen sclerosus is under control.  She did take some steroids during her chemotherapy.   PCP: Was Briscoe Deutscher, DO   No LMP recorded. (Menstrual status: Chemotherapy).           Sexually active: No.  The current method of family planning is Abstinence.    Exercising: No.  The patient does not participate in regular exercise at present. Smoker:  no  Health Maintenance: Pap: 01/19/18- normal pap and negative HR HPV, 11-18-14 Neg:Neg HR HPV, 10-24-11 Neg:Neg HR HPV History of abnormal Pap:  no MMG: Hx Rt.Br.CA/03-27-18 Bil.Br. MRI/Lt.Br.Neg/Rt.Br.No residual enhancement is identified/BiRads6 Colonoscopy:  2018 normal;next 5 years. BMD:   n/a  Result  N/a.  TDaP: 12-07-15 Gardasil:   no HIV:Neg in preg Hep C:Unsure Screening Labs:  Oncology.  Flu vaccine:  Recommended.   reports that she has never smoked. She has never used smokeless tobacco. She reports that she does not drink alcohol or use drugs.  Past Medical History:  Diagnosis Date  . Breast cancer ([place]) 11/2017   rt.Br.  . Depression   . Endometriosis, diagnosis via laparoscopy   . Family history of breast cancer   . Family history of breast cancer   . Family history of colon cancer   . Family history of prostate cancer   . Hyperlipidemia 2017  . Lichen sclerosus, vulva   . Low serum vitamin D 2017    Past Surgical History:  Procedure Laterality Date  . APPENDECTOMY  2005  . BREAST LUMPECTOMY WITH RADIOACTIVE SEED AND SENTINEL LYMPH NODE BIOPSY Right 04/23/2018   Procedure: RIGHT BREAST WITH RADIOACTIVE SEED LUMPECTOMY x3 AND SENTINEL LYMPH NODE  MAPPING;  Surgeon: Erroll Luna, MD;  Location: [facility];  Service: General;  Laterality: Right;  . CESAREAN SECTION  2002  . MANDIBLE RECONSTRUCTION  1985   wires inside jaw according to pt  . PELVIC LAPAROSCOPY  2004  . PORT-A-CATH REMOVAL Right 01/27/2019   Procedure: PORT REMOVAL;  Surgeon: Erroll Luna, MD;  Location: [place];  Service: General;  Laterality: Right;  . PORTACATH PLACEMENT Right 12/03/2017   Procedure: INSERTION PORT-A-CATH;  Surgeon: Erroll Luna, MD;  Location: [facility];  Service: General;  Laterality: Right;  . [facility] SURGERY  1998    Current Outpatient Medications  Medication Sig Dispense Refill  . anastrozole (ARIMIDEX) 1 MG tablet Take 1 tablet (1 mg total) by mouth daily. 90 tablet 3  . ARIPiprazole (ABILIFY) 5 MG tablet Take 0.5 tablets (2.5 mg total) by mouth daily. 30 tablet 0  . betamethasone valerate ointment (VALISONE) 0.1 % Apply bid for 1-2 weeks as needed for flare of lichen sclerosis. 15 g 1  . Biotin 10 MG CAPS Take by mouth.    . DULoxetine (CYMBALTA) 20 MG capsule Take 1 capsule by mouth 2 (two) times daily.    Marland Kitchen ibuprofen (ADVIL) 800 MG tablet Take 1 tablet (800 mg total) by mouth every 8 (eight) hours as needed. 30 tablet 0   No current facility-administered medications for this visit.  Family History  Problem Relation Age of Onset  . Stroke Mother   . Heart disease Mother   . Colon cancer Father   . Prostate cancer Father   . Hypertension Brother   . Cancer Maternal Uncle        NOS  . Breast cancer Paternal Aunt        dx in her 48s  . Prostate cancer Paternal Uncle   . Alcohol abuse Maternal Grandfather   . Breast cancer Paternal Grandmother        dx in her 18s  . Prostate cancer Paternal Grandfather   . SIDS Brother   . Cancer Paternal Aunt   . Tuberculosis Paternal Uncle     Review of Systems  All other systems reviewed and are negative.   Exam:   BP 112/70 (Cuff Size: Large)    Pulse 76   Temp 97.8 F (36.6 C) (Temporal)   Resp 14   Ht 5\' 10"  (1.778 m)   Wt 199 lb 9.6 oz (90.5 kg)   BMI 28.64 kg/m     General appearance: alert, cooperative and appears stated age Head: normocephalic, without obvious abnormality, atraumatic Neck: no adenopathy, supple, symmetrical, trachea midline and thyroid normal to inspection and palpation Lungs: clear to auscultation bilaterally Breasts: left - normal appearance, no masses or tenderness, No nipple retraction or dimpling, No nipple discharge or bleeding, No axillary adenopathy Right with general induration and scar of the right lateral quadrant.  No masses.  No nipple retraction or dimpling, No nipple discharge or bleeding, No axillary adenopathy Heart: regular rate and rhythm Abdomen: soft, non-tender; no masses, no organomegaly Extremities: extremities normal, atraumatic, no cyanosis or edema Skin: skin color, texture, turgor normal. No rashes or lesions Lymph nodes: cervical, supraclavicular, and axillary nodes normal. Neurologic: grossly normal  Pelvic: External genitalia:  Hypopigmentation of the perianal region.  Intact skin.               No abnormal inguinal nodes palpated.              Urethra:  normal appearing urethra with no masses, tenderness or lesions              Bartholins and Skenes: normal                 Vagina: normal appearing vagina with normal color and discharge, no lesions              Cervix: no lesions              Pap taken: No. Bimanual Exam:  Uterus:  normal size, contour, position, consistency, mobility, non-tender              Adnexa: no mass, fullness, tenderness              Rectal exam: Yes.  .  Confirms.              Anus:  normal sphincter tone, no lesions  Chaperone was present for exam.  Assessment:   Well woman visit with normal exam. Right breast cancer.  Negative genetic testing.  Status post lumpectomy, XRT, and chemotherapy.  Status post Mirena IUD removal. Menopausal  female. Lichen sclerosus.  Controlled. FH colon cancer.  Hx low vit D. Hx elevated LDL cholesterol. FH CVD. Depression.  On Abilify and Cymbalta.   Plan: Mammogram screening discussed. Self breast awareness reviewed. Pap and HR HPV as above. Guidelines for Calcium, Vitamin D, regular exercise program  including cardiovascular and weight bearing exercise. List of psychiatrists to patient.  Follow up annually and prn.   After visit summary provided.

## 2019-02-10 NOTE — Patient Instructions (Signed)

## 2019-02-23 ENCOUNTER — Encounter (HOSPITAL_COMMUNITY): Payer: Self-pay | Admitting: Psychiatry

## 2019-02-23 ENCOUNTER — Ambulatory Visit (INDEPENDENT_AMBULATORY_CARE_PROVIDER_SITE_OTHER): Payer: BC Managed Care – PPO | Admitting: Psychiatry

## 2019-02-23 ENCOUNTER — Other Ambulatory Visit: Payer: Self-pay

## 2019-02-23 DIAGNOSIS — F411 Generalized anxiety disorder: Secondary | ICD-10-CM

## 2019-02-23 DIAGNOSIS — F330 Major depressive disorder, recurrent, mild: Secondary | ICD-10-CM

## 2019-02-23 MED ORDER — ARIPIPRAZOLE 5 MG PO TABS: 2.5000 mg | ORAL_TABLET | Freq: Every day | ORAL | 0 refills | Status: DC

## 2019-02-23 MED ORDER — DULOXETINE HCL 30 MG PO CPEP: 30.0000 mg | ORAL_CAPSULE | Freq: Two times a day (BID) | ORAL | 2 refills | Status: DC

## 2019-02-23 NOTE — Progress Notes (Signed)
Virtual Visit via Telephone Note  I connected with Sandra [name] on 02/23/19 at  8:40 AM EST by telephone and verified that I am speaking with the correct person using two identifiers.   I discussed the limitations, risks, security and privacy concerns of performing an evaluation and management service by telephone and the availability of in person appointments. I also discussed with the patient that there may be a patient responsible charge related to this service. The patient expressed understanding and agreed to proceed.   History of Present Illness: Patient was evaluated by phone session.  She is doing very well since we increase the dose to 30 mg twice a day.  She has no tremors, shakes or any EPS.  She is taking Abilify 2.5 that is helping her depression, anxiety and crying spells.  She is sleeping better.  Recently she had a small procedure and her port was removed.  Patient lives with her husband and 67 year old daughter.  Patient denies any paranoia, hallucination, mania or any psychosis.  She like to continue current medication.  Past Psychiatric History[facility] Tried Paxil, Lexapro, ativan and trazadonebut don'tremember. Lamictal did not help and Wellbutrin causeddepression. We tried Effexor and Prozac but did not help.  No h/o psychosis, suicidal attempt or Inpatient.   Psychiatric Specialty Exam: Physical Exam  ROS  There were no vitals taken for this visit.There is no height or weight on file to calculate BMI.  General Appearance: NA  Eye Contact:  NA  Speech:  Clear and Coherent  Volume:  Normal  Mood:  Euthymic  Affect:  NA  Thought Process:  Goal Directed  Orientation:  Full (Time, Place, and Person)  Thought Content:  WDL and Logical  Suicidal Thoughts:  No  Homicidal Thoughts:  No  Memory:  Immediate;   Good Recent;   Good Remote;   Good  Judgement:  Good  Insight:  Good  Psychomotor Activity:  NA  Concentration:   Concentration: Good and Attention Span: Good  Recall:  Good  Fund of Knowledge:  Good  Language:  Good  Akathisia:  No  Handed:  Right  AIMS (if indicated):     Assets:  Communication Skills Desire for Improvement Housing Resilience Social Support  ADL's:  Intact  Cognition:  WNL  Sleep:   good      Assessment and Plan: Major depressive disorder, recurrent.  Generalized anxiety disorder.  Patient doing better since Cymbalta dose increased to 30 mg twice a day and Abilify 2.5 mg.  We will consider getting her off from Abilify in the future.  Discussed medication side effects and benefits.  Recommended to call us back if she is any question of any concern.  She is hoping to find a job to keep herself busy.  Recommended to continue therapy with Nehemiah Massed.  Recommended to call us back if she has any question or any concern.  Follow-up in 3 months.  Follow Up Instructions:    I discussed the assessment and treatment plan with the patient. The patient was provided an opportunity to ask questions and all were answered. The patient agreed with the plan and demonstrated an understanding of the instructions.   The patient was advised to call back or seek an in-person evaluation if the symptoms worsen or if the condition fails to improve as anticipated.  I provided 20 minutes of non-face-to-face time during this encounter.   Kathlee Nations, MD

## 2019-03-05 DIAGNOSIS — F4323 Adjustment disorder with mixed anxiety and depressed mood: Secondary | ICD-10-CM | POA: Diagnosis not present

## 2019-03-17 ENCOUNTER — Other Ambulatory Visit (HOSPITAL_COMMUNITY): Payer: Self-pay

## 2019-03-17 DIAGNOSIS — F330 Major depressive disorder, recurrent, mild: Secondary | ICD-10-CM

## 2019-03-17 MED ORDER — ARIPIPRAZOLE 5 MG PO TABS: 2.5000 mg | ORAL_TABLET | Freq: Every day | ORAL | 0 refills | Status: DC

## 2019-03-18 ENCOUNTER — Encounter: Payer: Self-pay | Admitting: Adult Health

## 2019-03-18 ENCOUNTER — Inpatient Hospital Stay: Payer: BC Managed Care – PPO | Attending: Adult Health | Admitting: Adult Health

## 2019-03-18 DIAGNOSIS — Z170 Estrogen receptor positive status [ER+]: Secondary | ICD-10-CM | POA: Diagnosis not present

## 2019-03-18 DIAGNOSIS — E2839 Other primary ovarian failure: Secondary | ICD-10-CM

## 2019-03-18 DIAGNOSIS — C50411 Malignant neoplasm of upper-outer quadrant of right female breast: Secondary | ICD-10-CM | POA: Diagnosis not present

## 2019-03-18 NOTE — Progress Notes (Signed)
SURVIVORSHIP VIRTUAL VISIT:  I connected with Sandra [name] on 03/18/19 at  2:00 PM EST by my chart video and verified that I am speaking with the correct person using two identifiers.  I discussed the limitations, risks, security and privacy concerns of performing an evaluation and management service virtually and the availability of in person appointments. I also discussed with the patient that there may be a patient responsible charge related to this service. The patient expressed understanding and agreed to proceed.   Patient location: At home, in private location Provider location: [facility], in office  BRIEF ONCOLOGIC HISTORY:  Oncology History  Malignant neoplasm of upper-outer quadrant of right breast in female, estrogen receptor positive ([place])  11/11/2017 Initial Diagnosis   Palpable right breast mass at 10 o'clock position 1.7 cm, axilla negative, biopsy revealed grade 3 IDC ER 60%, PR 10%, Ki-67 70%, HER-2 positive, T1 CN 0 stage Ia   11/19/2017 Cancer Staging   Staging form: Breast, AJCC 8th Edition - Clinical: Stage IB (cT2, cN0, cM0, G3, ER+, PR+, HER2+) - Signed by Nicholas Lose, MD on 12/04/2017   11/25/2017 Breast MRI   4.4 x 2.8 x 2.1 cm right breast malignancy UOQ, second focus 0.8 cm (biopsy planned for 12/09/2017)   12/04/2017 - 03/26/2018 Neo-Adjuvant Chemotherapy   TCH Perjeta every 3 week x6 followed by Herceptin Perjeta maintenance   01/04/2018 Genetic Testing   Negative genetic testing on the multi-cancer panel.  The Multi-Gene Panel offered by [facility] includes sequencing and/or deletion duplication testing of the following 84 genes: AIP, ALK, APC, ATM, AXIN2,BAP1,  BARD1, BLM, BMPR1A, BRCA1, BRCA2, BRIP1, CASR, CDC73, CDH1, CDK4, CDKN1B, CDKN1C, CDKN2A (p14ARF), CDKN2A (p16INK4a), CEBPA, CHEK2, CTNNA1, DICER1, DIS3L2, EGFR (c.2369C>T, p.Thr790Met variant only), EPCAM (Deletion/duplication testing only), FH, FLCN, GATA2, GPC3, GREM1 (Promoter region deletion/duplication testing  only), HOXB13 (c.251G>A, p.Gly84Glu), HRAS, KIT, MAX, MEN1, MET, MITF (c.952G>A, p.Glu318Lys variant only), MLH1, MSH2, MSH3, MSH6, MUTYH, NBN, NF1, NF2, NTHL1, PALB2, PDGFRA, PHOX2B, PMS2, POLD1, POLE, POT1, PRKAR1A, PTCH1, PTEN, RAD50, RAD51C, RAD51D, RB1, RECQL4, RET, RUNX1, SDHAF2, SDHA (sequence changes only), SDHB, SDHC, SDHD, SMAD4, SMARCA4, SMARCB1, SMARCE1, STK11, SUFU, TERC, TERT, TMEM127, TP53, TSC1, TSC2, VHL, WRN and WT1.  The report date is January 04, 2018.   04/23/2018 Surgery   Right lumpectomy ([place]) [phone]): Small residual invasive cancer multifocal 0.15 cm, grade 2, 0/3 lymph nodes all negative, ER 90%, PR 60%, HER-2 +3+ by IHC, Ki-67 20%, RCB class I, ympT1a, ypN0   05/07/2018 -  Chemotherapy   The patient had ado-trastuzumab emtansine (KADCYLA) 300 mg in sodium chloride 0.9 % 250 mL chemo infusion, 3.6 mg/kg = 300 mg, Intravenous, Once, 11 of 11 cycles Dose modification: 2.4 mg/kg (original dose 3.6 mg/kg, Cycle 8, Reason: Dose not tolerated) Administration: 300 mg (05/07/2018), 300 mg (05/28/2018), 300 mg (07/30/2018), 300 mg (08/20/2018), 300 mg (09/10/2018), 200 mg (10/01/2018), 200 mg (10/22/2018), 200 mg (11/12/2018), 200 mg (12/03/2018), 300 mg (06/18/2018), 300 mg (07/09/2018)  for chemotherapy treatment.    06/01/2018 - 07/15/2018 Radiation Therapy   The patient initially received a dose of 50.4 Gy in 28 fractions to the right breast using whole-breast tangent fields. This was delivered using a 3-D conformal technique. The patient then received a boost to the seroma. This delivered an additional 10 Gy in 5 fractions using 6X photons with a Complex Isodose technique. The total dose was 60.4 Gy.   12/2018 - 12/2023 Anti-estrogen oral therapy   Anastrozole     INTERVAL HISTORY:  Sandra [name]  to review her survivorship care plan detailing her treatment course for breast cancer, as well as monitoring long-term side effects of that treatment, education regarding health maintenance,  screening, and overall wellness and health promotion.     Overall, Sandra [name] reports feeling quite well.  Sandra [name] is taking anastrozole daily.  She is having hot flashes, but the ese are three to four times per week.  She has vaginal dryness and this is a chronic issue for her.  She has some arthralgias, but this is tolerable.    REVIEW OF SYSTEMS:  Review of Systems  Constitutional: Negative for appetite change, chills and fatigue.  HENT:   Negative for hearing loss, lump/mass and sore throat.   Eyes: Negative for eye problems and icterus.  Respiratory: Negative for chest tightness, cough and shortness of breath.   Cardiovascular: Negative for chest pain, leg swelling and palpitations.  Gastrointestinal: Negative for abdominal distention, abdominal pain, constipation, diarrhea, nausea and vomiting.  Endocrine: Negative for hot flashes.  Genitourinary: Negative for difficulty urinating.   Musculoskeletal: Positive for arthralgias (mild arthralgias).  Skin: Negative for itching and rash.  Neurological: Negative for dizziness, extremity weakness, headaches and numbness.  Hematological: Negative for adenopathy. Does not bruise/bleed easily.  Psychiatric/Behavioral: Negative for depression. The patient is not nervous/anxious.    Breast: Denies any new nodularity, masses, tenderness, nipple changes, or nipple discharge.      ONCOLOGY TREATMENT TEAM:  1. Surgeon:  Dr. Brantley Stage at [facility] 2. Medical Oncologist: Dr. Lindi Adie  3. Radiation Oncologist: Dr. Lisbeth Renshaw    PAST MEDICAL/SURGICAL HISTORY:  Past Medical History:  Diagnosis Date  . Breast cancer ([place]) 11/2017   rt.Br.  . Depression   . Endometriosis, diagnosis via laparoscopy   . Family history of breast cancer   . Family history of breast cancer   . Family history of colon cancer   . Family history of prostate cancer   . Hyperlipidemia 2017  . Lichen sclerosus, vulva   . Low serum vitamin D 2017   Past Surgical  History:  Procedure Laterality Date  . APPENDECTOMY  2005  . BREAST LUMPECTOMY WITH RADIOACTIVE SEED AND SENTINEL LYMPH NODE BIOPSY Right 04/23/2018   Procedure: RIGHT BREAST WITH RADIOACTIVE SEED LUMPECTOMY x3 AND SENTINEL LYMPH NODE MAPPING;  Surgeon: Erroll Luna, MD;  Location: [facility];  Service: General;  Laterality: Right;  . CESAREAN SECTION  2002  . MANDIBLE RECONSTRUCTION  1985   wires inside jaw according to pt  . PELVIC LAPAROSCOPY  2004  . PORT-A-CATH REMOVAL Right 01/27/2019   Procedure: PORT REMOVAL;  Surgeon: Erroll Luna, MD;  Location: [facility];  Service: General;  Laterality: Right;  . PORTACATH PLACEMENT Right 12/03/2017   Procedure: INSERTION PORT-A-CATH;  Surgeon: Erroll Luna, MD;  Location: [facility];  Service: General;  Laterality: Right;  . SHIN SURGERY  1998     ALLERGIES:  No Known Allergies   CURRENT MEDICATIONS:  Outpatient Encounter Medications as of 03/18/2019  Medication Sig  . anastrozole (ARIMIDEX) 1 MG tablet Take 1 tablet (1 mg total) by mouth daily.  . ARIPiprazole (ABILIFY) 5 MG tablet Take 0.5 tablets (2.5 mg total) by mouth daily.  . betamethasone valerate ointment (VALISONE) 0.1 % Apply bid for 1-2 weeks as needed for flare of lichen sclerosis.  . Biotin 10 MG CAPS Take by mouth.  . DULoxetine (CYMBALTA) 30 MG capsule Take 1 capsule (30 mg total) by mouth 2 (two) times daily.  Marland Kitchen ibuprofen (  ADVIL) 800 MG tablet Take 1 tablet (800 mg total) by mouth every 8 (eight) hours as needed.  . [DISCONTINUED] ARIPiprazole (ABILIFY) 5 MG tablet Take 0.5 tablets (2.5 mg total) by mouth daily.   No facility-administered encounter medications on file as of 03/18/2019.     ONCOLOGIC FAMILY HISTORY:  Family History  Problem Relation Age of Onset  . Stroke Mother   . Heart disease Mother   . Colon cancer Father   . Prostate cancer Father   . Hypertension Brother   . Cancer Maternal Uncle        NOS  . Breast  cancer Paternal Aunt        dx in her 68s  . Prostate cancer Paternal Uncle   . Alcohol abuse Maternal Grandfather   . Breast cancer Paternal Grandmother        dx in her 38s  . Prostate cancer Paternal Grandfather   . SIDS Brother   . Cancer Paternal Aunt   . Tuberculosis Paternal Uncle      GENETIC COUNSELING/TESTING: See above  SOCIAL HISTORY:  Social History   Socioeconomic History  . Marital status: Married    Spouse name: [name]  . Number of children: 2  . Years of education: Not on file  . Highest education level: Bachelor's degree (e.g., BA, AB, BS)  Occupational History  . Not on file  Tobacco Use  . Smoking status: Never Smoker  . Smokeless tobacco: Never Used  Substance and Sexual Activity  . Alcohol use: No    Alcohol/week: 0.0 standard drinks  . Drug use: No  . Sexual activity: Not Currently    Partners: Male    Comment: Mirena inserted 05/16/16, IUD is to be removed08/29/19  Other Topics Concern  . Not on file  Social History Narrative  . Not on file   Social Determinants of Health   Financial Resource Strain: Unknown  . Difficulty of Paying Living Expenses: Patient refused  Food Insecurity: Unknown  . Worried About Charity fundraiser in the Last Year: Patient refused  . Ran Out of Food in the Last Year: Patient refused  Transportation Needs: No Transportation Needs  . Lack of Transportation (Medical): No  . Lack of Transportation (Non-Medical): No  Physical Activity: Unknown  . Days of Exercise per Week: Patient refused  . Minutes of Exercise per Session: Patient refused  Stress: Unknown  . Feeling of Stress : Patient refused  Social Connections: Unknown  . Frequency of Communication with Friends and Family: Patient refused  . Frequency of Social Gatherings with Friends and Family: Not on file  . Attends Religious Services: Not on file  . Active Member of Clubs or Organizations: Not on file  . Attends Archivist Meetings: Not on  file  . Marital Status: Not on file  Intimate Partner Violence: Not At Risk  . Fear of Current or Ex-Partner: No  . Emotionally Abused: No  . Physically Abused: No  . Sexually Abused: No     OBSERVATIONS/OBJECTIVE:   Patient appears well.  She is in no apparent distress.  Her breathing is non labored.  Skin visualized without rash or lesion.  Mood and behavior are normal.     LABORATORY DATA:  None for this visit.  DIAGNOSTIC IMAGING:  None for this visit.      ASSESSMENT AND PLAN:  Ms.. [name] is a pleasant 53 y.o. female with Stage IB right breast invasive ductal carcinoma, ER+/PR+/HER2+, diagnosed in 10/2017, treated  with neoadjuvant chemotherapy, lumpectomy, adjuvant Kadcyla, adjuvant radiation therapy, and anti-estrogen therapy with Anastrozole beginning in 12/2018.  She presents to the Survivorship Clinic for our initial meeting and routine follow-up post-completion of treatment for breast cancer.    1. Stage IB right breast cancer:  Sandra [name] is continuing to recover from definitive treatment for breast cancer. She will follow-up with her medical oncologist, Dr. Lindi Adie in 6 motnhs with history and physical exam per surveillance protocol.  She will continue her anti-estrogen therapy with Anastrozole. Thus far, she is tolerating the Anastrozole well, with minimal side effects. She was instructed to make Dr. Lindi Adie or myself aware if she begins to experience any worsening side effects of the medication and I could see her back in clinic to help manage those side effects, as needed. Her mammogram is due; orders placed today. Today, a comprehensive survivorship care plan and treatment summary was reviewed with the patient today detailing her breast cancer diagnosis, treatment course, potential late/long-term effects of treatment, appropriate follow-up care with recommendations for the future, and patient education resources.  A copy of this summary, along with a letter will be sent to  the patient's primary care provider via mail/fax/In Basket message after today's visit.    2. Bone health:  Given Sandra [name]'s age/history of breast cancer and her current treatment regimen including anti-estrogen therapy with Anastrozole, she is at risk for bone demineralization.  She has not undergone bone density testing, and I placed orders for her to undergo this at the breast center when available.  In the meantime, she was encouraged to increase her consumption of foods rich in calcium, as well as increase her weight-bearing activities.  She was given education on specific activities to promote bone health.  3. Cancer screening:  Due to Sandra [name]'s history and her age, she should receive screening for skin cancers, colon cancer, and gynecologic cancers.  The information and recommendations are listed on the patient's comprehensive care plan/treatment summary and were reviewed in detail with the patient.    4. Health maintenance and wellness promotion: Sandra [name] was encouraged to consume 5-7 servings of fruits and vegetables per day. We reviewed the "Nutrition Rainbow" handout, as well as the handout "Take Control of Your Health and Reduce Your Cancer Risk" from the [employer].  She was also encouraged to engage in moderate to vigorous exercise for 30 minutes per day most days of the week. We discussed the [employer] fitness program, which is designed for cancer survivors to help them become more physically fit after cancer treatments.  She was instructed to limit her alcohol consumption and continue to abstain from tobacco use.     5. Support services/counseling: It is not uncommon for this period of the patient's cancer care trajectory to be one of many emotions and stressors.  We discussed how this can be increasingly difficult during the times of quarantine and social distancing due to the COVID-19 pandemic.   She was given information regarding our available services and  encouraged to contact me with any questions or for help enrolling in any of our support group/programs.    Follow up instructions:    -Return to cancer center in 6 months for f/u with Dr. Lindi Adie  -Mammogram due  -DEXA due -Follow up with surgery in 05/2019 -She is welcome to return back to the Survivorship Clinic at any time; no additional follow-up needed at this time.  -Consider referral back to survivorship as a long-term survivor for  continued surveillance  The patient was provided an opportunity to ask questions and all were answered. The patient agreed with the plan and demonstrated an understanding of the instructions.   The patient was advised to call back or seek an in-person evaluation if the symptoms worsen or if the condition fails to improve as anticipated.   I provided 25 minutes of face-to-face video visit time during this encounter, and > 50% was spent counseling as documented under my assessment & plan.  Scot Dock, NP

## 2019-04-01 ENCOUNTER — Other Ambulatory Visit: Payer: Self-pay

## 2019-04-01 ENCOUNTER — Ambulatory Visit
Admission: RE | Admit: 2019-04-01 | Discharge: 2019-04-01 | Disposition: A | Discharge status: Home / Self Care | Payer: BC Managed Care – PPO | Source: Ambulatory Visit | Attending: Adult Health | Admitting: Adult Health

## 2019-04-01 DIAGNOSIS — R928 Other abnormal and inconclusive findings on diagnostic imaging of breast: Secondary | ICD-10-CM | POA: Diagnosis not present

## 2019-04-01 DIAGNOSIS — Z170 Estrogen receptor positive status [ER+]: Secondary | ICD-10-CM

## 2019-04-01 DIAGNOSIS — C50411 Malignant neoplasm of upper-outer quadrant of right female breast: Secondary | ICD-10-CM

## 2019-04-01 HISTORY — DX: Personal history of irradiation: Z92.3

## 2019-04-15 ENCOUNTER — Other Ambulatory Visit (HOSPITAL_COMMUNITY): Payer: Self-pay | Admitting: Psychiatry

## 2019-04-15 DIAGNOSIS — F330 Major depressive disorder, recurrent, mild: Secondary | ICD-10-CM

## 2019-04-15 DIAGNOSIS — F411 Generalized anxiety disorder: Secondary | ICD-10-CM

## 2019-04-20 ENCOUNTER — Ambulatory Visit
Admission: RE | Admit: 2019-04-20 | Discharge: 2019-04-20 | Disposition: A | Discharge status: Home / Self Care | Payer: BC Managed Care – PPO | Source: Ambulatory Visit | Attending: Adult Health | Admitting: Adult Health

## 2019-04-20 ENCOUNTER — Other Ambulatory Visit: Payer: Self-pay

## 2019-04-20 ENCOUNTER — Telehealth: Payer: Self-pay

## 2019-04-20 DIAGNOSIS — Z13820 Encounter for screening for osteoporosis: Secondary | ICD-10-CM | POA: Diagnosis not present

## 2019-04-20 DIAGNOSIS — Z780 Asymptomatic menopausal state: Secondary | ICD-10-CM | POA: Diagnosis not present

## 2019-04-20 DIAGNOSIS — C50411 Malignant neoplasm of upper-outer quadrant of right female breast: Secondary | ICD-10-CM

## 2019-04-20 DIAGNOSIS — E2839 Other primary ovarian failure: Secondary | ICD-10-CM

## 2019-04-20 DIAGNOSIS — Z170 Estrogen receptor positive status [ER+]: Secondary | ICD-10-CM

## 2019-04-20 NOTE — Telephone Encounter (Signed)
Spoke with patient to inform of normal bone density results.  Patient voiced understanding and thanks for call. 

## 2019-04-20 NOTE — Telephone Encounter (Signed)
-----   Message from Gardenia Phlegm, NP sent at 04/20/2019  9:24 AM EST ----- Good news.  Patient bone density is normal.  Please notify patient.   ----- Message ----- From: Interface, Rad Results In Sent: 04/20/2019   8:12 AM EST To: Gardenia Phlegm, NP

## 2019-04-26 DIAGNOSIS — F4323 Adjustment disorder with mixed anxiety and depressed mood: Secondary | ICD-10-CM | POA: Diagnosis not present

## 2019-05-10 DIAGNOSIS — F331 Major depressive disorder, recurrent, moderate: Secondary | ICD-10-CM | POA: Diagnosis not present

## 2019-05-17 DIAGNOSIS — Z853 Personal history of malignant neoplasm of breast: Secondary | ICD-10-CM | POA: Diagnosis not present

## 2019-05-24 DIAGNOSIS — F411 Generalized anxiety disorder: Secondary | ICD-10-CM | POA: Diagnosis not present

## 2019-05-24 DIAGNOSIS — F331 Major depressive disorder, recurrent, moderate: Secondary | ICD-10-CM | POA: Diagnosis not present

## 2019-05-25 ENCOUNTER — Ambulatory Visit (INDEPENDENT_AMBULATORY_CARE_PROVIDER_SITE_OTHER): Payer: Self-pay | Admitting: Psychiatry

## 2019-05-25 ENCOUNTER — Encounter (HOSPITAL_COMMUNITY): Payer: Self-pay | Admitting: Psychiatry

## 2019-05-25 ENCOUNTER — Other Ambulatory Visit: Payer: Self-pay

## 2019-05-25 DIAGNOSIS — F330 Major depressive disorder, recurrent, mild: Secondary | ICD-10-CM

## 2019-05-25 DIAGNOSIS — F411 Generalized anxiety disorder: Secondary | ICD-10-CM

## 2019-05-25 MED ORDER — DULOXETINE HCL 30 MG PO CPEP: 30.0000 mg | ORAL_CAPSULE | Freq: Two times a day (BID) | ORAL | 0 refills | Status: DC

## 2019-05-25 MED ORDER — ARIPIPRAZOLE 5 MG PO TABS: 2.5000 mg | ORAL_TABLET | Freq: Every day | ORAL | 0 refills | Status: DC

## 2019-05-25 NOTE — Progress Notes (Signed)
Virtual Visit via Telephone Note  I connected with Sandra [name] on 05/25/19 at  8:40 AM EST by telephone and verified that I am speaking with the correct person using two identifiers.   I discussed the limitations, risks, security and privacy concerns of performing an evaluation and management service by telephone and the availability of in person appointments. I also discussed with the patient that there may be a patient responsible charge related to this service. The patient expressed understanding and agreed to proceed.   History of Present Illness: Patient was evaluated by phone session.  She reported not doing well because she is very stressed about her daughter who recently post KYS kill your cell phone social media.  Patient told her 96 year old daughter has borderline personality traits.  She is seeing therapist but lately her post because a lot of stress and anxiety.  She has difficulty sleeping.  She admitted to having crying spells, severe anxiety and depression.  She sleeps on and off.  She admitted noncompliant with Abilify for more than few months because she thought she does not need it.  She recall Abilify was working very well and we had discussed at some point take her off but she understood that she can stop Abilify.  She is compliant with Cymbalta 30 mg twice a day.  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  She admitted lately not able to feel that she is productive.  She has difficulty attention, concentrating and doing her job.  She is not sure if she can keep the job.  She had a support from her husband.  Physically she is better.  She denies any suicidal thoughts or homicidal thoughts.  She denies any paranoia or any hallucination.   Past Psychiatric History: [facility] Tried Paxil, Lexapro, ativan and trazadonebut don'tremember. Lamictal did not help and Wellbutrin  causeddepression. We tried Effexor and Prozac but did not help. No h/o psychosis, suicidal attempt or Inpatient.    Psychiatric Specialty Exam: Physical Exam  Review of Systems  There were no vitals taken for this visit.There is no height or weight on file to calculate BMI.  General Appearance: NA  Eye Contact:  NA  Speech:  Normal Rate  Volume:  Decreased  Mood:  Anxious, Depressed and Dysphoric  Affect:  NA  Thought Process:  Descriptions of Associations: Intact  Orientation:  Full (Time, Place, and Person)  Thought Content:  Rumination  Suicidal Thoughts:  No  Homicidal Thoughts:  No  Memory:  Immediate;   Good Recent;   Good Remote;   Good  Judgement:  Fair  Insight:  Present  Psychomotor Activity:  NA  Concentration:  Concentration: Fair and Attention Span: Fair  Recall:  [place] of Knowledge:  Good  Language:  Good  Akathisia:  No  Handed:  Right  AIMS (if indicated):     Assets:  Communication Skills Desire for Improvement Housing Resilience Social Support  ADL's:  Intact  Cognition:  WNL  Sleep:   fair      Assessment and Plan: Major depressive disorder, recurrent.  Generalized anxiety disorder.  Discussed current stress related to her daughter.  Recommended to go back on Abilify 2.5 mg which was helping her mood.  However if she do not feel any improvement with Abilify then we will consider increasing Cymbalta dose.  So far she is tolerating her medication without  any side effects.  She is in therapy with Nehemiah Massed and she had a good support from her friends and husband.  Recommended to call us back if she has any question or any concern.  Follow-up in 4 weeks.  Follow Up Instructions:    I discussed the assessment and treatment plan with the patient. The patient was provided an opportunity to ask questions and all were answered. The patient agreed with the plan and demonstrated an understanding of the instructions.   The patient was advised  to call back or seek an in-person evaluation if the symptoms worsen or if the condition fails to improve as anticipated.  I provided 20 minutes of non-face-to-face time during this encounter.   Kathlee Nations, MD

## 2019-06-07 DIAGNOSIS — F331 Major depressive disorder, recurrent, moderate: Secondary | ICD-10-CM | POA: Diagnosis not present

## 2019-06-22 ENCOUNTER — Encounter (HOSPITAL_COMMUNITY): Payer: Self-pay | Admitting: Psychiatry

## 2019-06-22 ENCOUNTER — Ambulatory Visit (INDEPENDENT_AMBULATORY_CARE_PROVIDER_SITE_OTHER): Payer: Self-pay | Admitting: Psychiatry

## 2019-06-22 ENCOUNTER — Other Ambulatory Visit: Payer: Self-pay

## 2019-06-22 DIAGNOSIS — F330 Major depressive disorder, recurrent, mild: Secondary | ICD-10-CM

## 2019-06-22 DIAGNOSIS — F411 Generalized anxiety disorder: Secondary | ICD-10-CM

## 2019-06-22 MED ORDER — ARIPIPRAZOLE 5 MG PO TABS: 2.5000 mg | ORAL_TABLET | Freq: Every day | ORAL | 1 refills | Status: DC

## 2019-06-22 MED ORDER — DULOXETINE HCL 30 MG PO CPEP: 30.0000 mg | ORAL_CAPSULE | Freq: Two times a day (BID) | ORAL | 1 refills | Status: DC

## 2019-06-22 NOTE — Progress Notes (Signed)
Virtual Visit via Telephone Note  I connected with Sandra [name] on 06/22/19 at  8:40 AM EDT by telephone and verified that I am speaking with the correct person using two identifiers.   I discussed the limitations, risks, security and privacy concerns of performing an evaluation and management service by telephone and the availability of in person appointments. I also discussed with the patient that there may be a patient responsible charge related to this service. The patient expressed understanding and agreed to proceed.   History of Present Illness: Patient was evaluated by phone session. We started her on Abilify and she is taking 2.5 mg every day. She noticed much improvement in her anxiety and depression. She does not have any crying spells. Her daughter is also doing good and she is seeing therapist and recently change the medicine help her. She reported that since taking the Abilify her sleep is interrupted and does not sleep all night. However she does not want to change Abilify because she noticed much improvement in her motivation, depression, crying spells. She is more hopeful. She endorsed gained a few pounds because she is not watching her calorie intake and not doing exercise. She is compliant with Cymbalta. She has no tremors, shakes or any EPS. She is working but sometimes job is challenging but so far she is handling better. She denies any paranoia or any hallucination. She took over-the-counter melatonin some nights which works. She is in therapy with Nehemiah Massed.  Past Psychiatric History: [facility] Tried Paxil, Lexapro, ativan and trazadonebut don'tremember. Lamictal did not help and Wellbutrin causeddepression. We tried EffexorandProzac butdid not help. Noh/opsychosis, suicidal attempt or Inpatient.   Psychiatric Specialty Exam: Physical Exam  Review of Systems  Weight 210 lb (95.3 kg).There is no height or weight on file to  calculate BMI.  General Appearance: NA  Eye Contact:  NA  Speech:  Normal Rate  Volume:  Normal  Mood:  Euthymic  Affect:  NA  Thought Process:  Goal Directed  Orientation:  Full (Time, Place, and Person)  Thought Content:  WDL  Suicidal Thoughts:  No  Homicidal Thoughts:  No  Memory:  Immediate;   Good Recent;   Good Remote;   Good  Judgement:  Good  Insight:  Present  Psychomotor Activity:  NA  Concentration:  Concentration: Fair and Attention Span: Good  Recall:  Good  Fund of Knowledge:  Good  Language:  Good  Akathisia:  No  Handed:  Right  AIMS (if indicated):     Assets:  Communication Skills Desire for Improvement Housing Resilience Social Support Transportation  ADL's:  Intact  Cognition:  WNL  Sleep:   interrupted sleep      Assessment and Plan: Major depressive disorder, recurrent. Generalized anxiety disorder.  Patient doing better since started taking Abilify 2.5 mg. So far she is tolerating other than sleep interruption and increased appetite. Recommend to try taking Abilify at night if that helps her sleep otherwise she will continue over-the-counter melatonin which works. Encouraged to continue therapy with Nehemiah Massed. Continue Cymbalta 30 mg twice a day. Encourage healthy eating and regular exercise to avoid weight gain. She agreed with the plan. Recommended to call us back if she has any question of any concern. Follow-up in 2 months.  Follow Up Instructions:    I discussed the assessment and treatment plan with the patient. The patient was provided an opportunity to ask questions and all were answered. The patient agreed  with the plan and demonstrated an understanding of the instructions.   The patient was advised to call back or seek an in-person evaluation if the symptoms worsen or if the condition fails to improve as anticipated.  I provided 20 minutes of non-face-to-face time during this encounter.   Kathlee Nations, MD

## 2019-07-12 DIAGNOSIS — F411 Generalized anxiety disorder: Secondary | ICD-10-CM | POA: Diagnosis not present

## 2019-07-12 DIAGNOSIS — F331 Major depressive disorder, recurrent, moderate: Secondary | ICD-10-CM | POA: Diagnosis not present

## 2019-07-23 ENCOUNTER — Other Ambulatory Visit (HOSPITAL_COMMUNITY): Payer: Self-pay | Admitting: *Deleted

## 2019-07-23 ENCOUNTER — Telehealth (HOSPITAL_COMMUNITY): Payer: Self-pay | Admitting: *Deleted

## 2019-07-23 DIAGNOSIS — F330 Major depressive disorder, recurrent, mild: Secondary | ICD-10-CM

## 2019-07-23 MED ORDER — ARIPIPRAZOLE 5 MG PO TABS: 2.5000 mg | ORAL_TABLET | Freq: Every day | ORAL | 1 refills | Status: DC

## 2019-07-23 NOTE — Telephone Encounter (Signed)
She is taking Abilify 2.5 mg daily.  Please call Abilify 5 mg #45 which should be enough for 90-day supply.

## 2019-07-23 NOTE — Telephone Encounter (Signed)
Received fax from [facility] requesting 90 day supply of pt Abilify 5mg . Taking 1/2 tab daily. Please review and advise.

## 2019-07-26 ENCOUNTER — Other Ambulatory Visit (HOSPITAL_COMMUNITY): Payer: Self-pay | Admitting: Psychiatry

## 2019-07-26 DIAGNOSIS — F411 Generalized anxiety disorder: Secondary | ICD-10-CM

## 2019-07-26 DIAGNOSIS — F331 Major depressive disorder, recurrent, moderate: Secondary | ICD-10-CM | POA: Diagnosis not present

## 2019-07-26 DIAGNOSIS — F330 Major depressive disorder, recurrent, mild: Secondary | ICD-10-CM

## 2019-08-18 ENCOUNTER — Ambulatory Visit (HOSPITAL_COMMUNITY): Payer: Self-pay | Admitting: Psychiatry

## 2019-08-18 ENCOUNTER — Telehealth: Payer: Self-pay | Admitting: Hematology and Oncology

## 2019-08-18 NOTE — Telephone Encounter (Signed)
Scheduled per 5/18 sch message. Pt is aware of appt on 6/17.

## 2019-08-19 ENCOUNTER — Encounter (HOSPITAL_COMMUNITY): Payer: Self-pay | Admitting: Psychiatry

## 2019-08-19 ENCOUNTER — Telehealth (INDEPENDENT_AMBULATORY_CARE_PROVIDER_SITE_OTHER): Payer: Self-pay | Admitting: Psychiatry

## 2019-08-19 ENCOUNTER — Other Ambulatory Visit: Payer: Self-pay

## 2019-08-19 DIAGNOSIS — F330 Major depressive disorder, recurrent, mild: Secondary | ICD-10-CM

## 2019-08-19 DIAGNOSIS — F411 Generalized anxiety disorder: Secondary | ICD-10-CM

## 2019-08-19 MED ORDER — DULOXETINE HCL 60 MG PO CPEP: 60.0000 mg | ORAL_CAPSULE | Freq: Every day | ORAL | 0 refills | Status: DC

## 2019-08-19 NOTE — Progress Notes (Signed)
Virtual Visit via Telephone Note  I connected with Sandra [name] on 08/19/19 at  9:20 AM EDT by telephone and verified that I am speaking with the correct person using two identifiers.  Patient Location: Patient office   I discussed the limitations, risks, security and privacy concerns of performing an evaluation and management service by telephone and the availability of in person appointments. I also discussed with the patient that there may be a patient responsible charge related to this service. The patient expressed understanding and agreed to proceed.   History of Present Illness: Patient is evaluated by phone session.  She is stating her Abilify 2.5 mg and Cymbalta 60 mg.  She needed early prescription because she uses [facility] coupon and sometimes she will look around.  She can get a better price.  Overall she feels her depression and anxiety is stable but she still concerned about her daughter who does not have friends in some time feels isolated.  Her daughter is seeing therapist and taking medication.  Patient sleeping better.  Sometimes she feels tired but denies any crying spells or any feeling of hopelessness or suicidal thoughts.  She admitted weight gain but not sure since she does not check the weight recently.  She has upcoming appointment with Dr. Payton Mccallum in a few weeks.  She denies any paranoia, tremors, shakes or any EPS.  She takes over-the-counter melatonin which also works well for her sleep.  She is in therapy with Nehemiah Massed..  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  Her job is going well.  She like to keep her current medication.     Past Psychiatric History: [facility] Tried Paxil, Lexapro, ativan and trazadonebut don'tremember. Lamictal did not help and Wellbutrin causeddepression. We tried EffexorandProzac butdid not help. Noh/opsychosis, suicidal attempt or  Inpatient.   Psychiatric Specialty Exam: Physical Exam  Review of Systems  There were no vitals taken for this visit.There is no height or weight on file to calculate BMI.  General Appearance: NA  Eye Contact:  NA  Speech:  Clear and Coherent  Volume:  Normal  Mood:  Euthymic  Affect:  NA  Thought Process:  Coherent  Orientation:  Full (Time, Place, and Person)  Thought Content:  WDL  Suicidal Thoughts:  No  Homicidal Thoughts:  No  Memory:  Immediate;   Good Recent;   Good Remote;   Good  Judgement:  Good  Insight:  Present  Psychomotor Activity:  NA  Concentration:  Concentration: Good and Attention Span: Good  Recall:  Good  Fund of Knowledge:  Good  Language:  Good  Akathisia:  No  Handed:  Right  AIMS (if indicated):     Assets:  Communication Skills Desire for Improvement Housing Resilience  ADL's:  Intact  Cognition:  WNL  Sleep:   7 hrs      Assessment and Plan: Major depressive disorder, recurrent.  Generalized anxiety disorder.  Patient is a stable on low-dose Abilify.  I recommend to continue Abilify 2.5 mg daily and try to consolidate her Cymbalta rather than taking 30 mg twice daily to 60 mg daily to avoid fatigue.  She agreed with the plan.  Encouraged to continue therapy with Nehemiah Massed.  She like to have her prescription sent to [facility] for Cymbalta as recently she picked up Abilify from [facility] as she gets cheaper and less expensive  medication through [facility].  I recommend to call us back if she has any questions or any concerns.  Follow-up in 3 months.  Follow Up Instructions:    I discussed the assessment and treatment plan with the patient. The patient was provided an opportunity to ask questions and all were answered. The patient agreed with the plan and demonstrated an understanding of the instructions.   The patient was advised to call back or seek an in-person evaluation if the symptoms worsen or if the condition fails to improve as  anticipated.  I provided 15 minutes of non-face-to-face time during this encounter.   Kathlee Nations, MD

## 2019-09-01 ENCOUNTER — Telehealth: Payer: Self-pay | Admitting: Hematology and Oncology

## 2019-09-01 NOTE — Telephone Encounter (Signed)
Rescheduled appt on 6/17 to 6/22. Provider on PAL. Unable to reach pt. Left voicemail.

## 2019-09-16 ENCOUNTER — Ambulatory Visit: Payer: Self-pay | Admitting: Hematology and Oncology

## 2019-09-20 NOTE — Progress Notes (Signed)
Patient Care Team: Patient, No Pcp Per as PCP - General (General Practice) Macario Carls, MD as Referring Physician (Specialist) Erroll Luna, MD as Consulting Physician (General Surgery) Nicholas Lose, MD as Consulting Physician (Hematology and Oncology) Kyung Rudd, MD as Consulting Physician (Radiation Oncology)  DIAGNOSIS:    ICD-10-CM   1. Malignant neoplasm of upper-outer quadrant of right breast in female, estrogen receptor positive ([place])  C50.411    Z17.0     SUMMARY OF ONCOLOGIC HISTORY: Oncology History  Malignant neoplasm of upper-outer quadrant of right breast in female, estrogen receptor positive ([place])  11/11/2017 Initial Diagnosis   Palpable right breast mass at 10 o'clock position 1.7 cm, axilla negative, biopsy revealed grade 3 IDC ER 60%, PR 10%, Ki-67 70%, HER-2 positive, T1 CN 0 stage Ia   11/19/2017 Cancer Staging   Staging form: Breast, AJCC 8th Edition - Clinical: Stage IB (cT2, cN0, cM0, G3, ER+, PR+, HER2+) - Signed by Nicholas Lose, MD on 12/04/2017   11/25/2017 Breast MRI   4.4 x 2.8 x 2.1 cm right breast malignancy UOQ, second focus 0.8 cm (biopsy planned for 12/09/2017)   12/04/2017 - 03/26/2018 Neo-Adjuvant Chemotherapy   TCH Perjeta every 3 week x6 followed by Herceptin Perjeta maintenance   01/04/2018 Genetic Testing   Negative genetic testing on the multi-cancer panel.  The Multi-Gene Panel offered by [facility] includes sequencing and/or deletion duplication testing of the following 84 genes: AIP, ALK, APC, ATM, AXIN2,BAP1,  BARD1, BLM, BMPR1A, BRCA1, BRCA2, BRIP1, CASR, CDC73, CDH1, CDK4, CDKN1B, CDKN1C, CDKN2A (p14ARF), CDKN2A (p16INK4a), CEBPA, CHEK2, CTNNA1, DICER1, DIS3L2, EGFR (c.2369C>T, p.Thr790Met variant only), EPCAM (Deletion/duplication testing only), FH, FLCN, GATA2, GPC3, GREM1 (Promoter region deletion/duplication testing only), HOXB13 (c.251G>A, p.Gly84Glu), HRAS, KIT, MAX, MEN1, MET, MITF (c.952G>A, p.Glu318Lys variant only), MLH1, MSH2,  MSH3, MSH6, MUTYH, NBN, NF1, NF2, NTHL1, PALB2, PDGFRA, PHOX2B, PMS2, POLD1, POLE, POT1, PRKAR1A, PTCH1, PTEN, RAD50, RAD51C, RAD51D, RB1, RECQL4, RET, RUNX1, SDHAF2, SDHA (sequence changes only), SDHB, SDHC, SDHD, SMAD4, SMARCA4, SMARCB1, SMARCE1, STK11, SUFU, TERC, TERT, TMEM127, TP53, TSC1, TSC2, VHL, WRN and WT1.  The report date is January 04, 2018.   04/23/2018 Surgery   Right lumpectomy ([facility]) [phone]): Small residual invasive cancer multifocal 0.15 cm, grade 2, 0/3 lymph nodes all negative, ER 90%, PR 60%, HER-2 +3+ by IHC, Ki-67 20%, RCB class I, ympT1a, ypN0   05/07/2018 -  Chemotherapy   The patient had ado-trastuzumab emtansine (KADCYLA) 300 mg in sodium chloride 0.9 % 250 mL chemo infusion, 3.6 mg/kg = 300 mg, Intravenous, Once, 11 of 11 cycles Dose modification: 2.4 mg/kg (original dose 3.6 mg/kg, Cycle 8, Reason: Dose not tolerated) Administration: 300 mg (05/07/2018), 300 mg (05/28/2018), 300 mg (07/30/2018), 300 mg (08/20/2018), 300 mg (09/10/2018), 200 mg (10/01/2018), 200 mg (10/22/2018), 200 mg (11/12/2018), 200 mg (12/03/2018), 300 mg (06/18/2018), 300 mg (07/09/2018)  for chemotherapy treatment.    06/01/2018 - 07/15/2018 Radiation Therapy   The patient initially received a dose of 50.4 Gy in 28 fractions to the right breast using whole-breast tangent fields. This was delivered using a 3-D conformal technique. The patient then received a boost to the seroma. This delivered an additional 10 Gy in 5 fractions using 6X photons with a Complex Isodose technique. The total dose was 60.4 Gy.   12/2018 - 12/2023 Anti-estrogen oral therapy   Anastrozole     CHIEF COMPLIANT: Follow-up of right breast cancer on anastrozole  INTERVAL HISTORY: Sandra [name] is a 54 y.o. with above-mentioned history of HER-2  positive right breast cancer treated with neoadjuvant chemotherapy with [place] Perjeta, right lumpectomy, Kadycla maintenance, and is currently on antiestrogen therapy with anastrozole. Mammogram on  03/31/20 showed no evidence of malignancy bilaterally. Bone density scan on 04/20/19 showed normal bone density with a T-score of -0.3 at the right femur. She presents to the clinic todayfor follow-up.  Over the past 2 months she has been experiencing lots of side effects to anastrozole therapy.  She has been feeling extremely tired to the point that she is not doing any activities.  She sleeps all day and she also has diffuse achiness in most of her joints.  ALLERGIES:  has No Known Allergies.  MEDICATIONS:  Current Outpatient Medications  Medication Sig Dispense Refill  . anastrozole (ARIMIDEX) 1 MG tablet Take 1 tablet (1 mg total) by mouth daily. 90 tablet 3  . ARIPiprazole (ABILIFY) 5 MG tablet Take 0.5 tablets (2.5 mg total) by mouth daily. 45 tablet 1  . betamethasone valerate ointment (VALISONE) 0.1 % Apply bid for 1-2 weeks as needed for flare of lichen sclerosis. 15 g 1  . Biotin 10 MG CAPS Take by mouth.    . DULoxetine (CYMBALTA) 60 MG capsule Take 1 capsule (60 mg total) by mouth daily. 90 capsule 0  . ibuprofen (ADVIL) 800 MG tablet Take 1 tablet (800 mg total) by mouth every 8 (eight) hours as needed. 30 tablet 0   No current facility-administered medications for this visit.    PHYSICAL EXAMINATION: ECOG PERFORMANCE STATUS: 2 - Symptomatic, <50% confined to bed  Vitals:   09/21/19 1046  BP: 117/79  Pulse: 95  Resp: 17  Temp: 98.7 F (37.1 C)  SpO2: 97%   Filed Weights   09/21/19 1046  Weight: 225 lb (102.1 kg)      LABORATORY DATA:  I have reviewed the data as listed CMP Latest Ref Rng & Units 12/22/2018 12/03/2018 11/12/2018  Glucose 70 - 99 mg/dL 88 82 83  BUN 6 - 20 mg/dL '14 17 17  ' Creatinine 0.44 - 1.00 mg/dL 0.99 0.89 0.93  Sodium 135 - 145 mmol/L 141 139 138  Potassium 3.5 - 5.1 mmol/L 4.3 3.8 3.8  Chloride 98 - 111 mmol/L 103 104 103  CO2 22 - 32 mmol/L '29 26 28  ' Calcium 8.9 - 10.3 mg/dL 9.6 9.4 9.6  Total Protein 6.5 - 8.1 g/dL 7.4 7.6 7.4  Total  Bilirubin 0.3 - 1.2 mg/dL 0.4 0.3 0.3  Alkaline Phos 38 - 126 U/L 68 68 71  AST 15 - 41 U/L 38 29 27  ALT 0 - 44 U/L 38 26 27    Lab Results  Component Value Date   WBC 3.4 (L) 12/22/2018   HGB 12.2 12/22/2018   HCT 36.3 12/22/2018   MCV 90.8 12/22/2018   PLT 121 (L) 12/22/2018   NEUTROABS 1.7 12/22/2018    ASSESSMENT & PLAN:  Malignant neoplasm of upper-outer quadrant of right breast in female, estrogen receptor positive ([place]) 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  Treatment plan: 1.Neoadjuvant chemotherapy with [place] Perjeta 12/04/2017-12/26/2019followed by Kadcyla x 1 year completed 12/03/2018 2.breast conserving  surgery with sentinel lymph node biopsy: 04/23/2018 3.Radiation therapy: Completed 07/15/2018 4.Adjuvant antiestrogen therapy started 12/15/2018 -------------------------------------------------------------------------------------------------------------------------------- 04/23/2018:Right lumpectomy: Small residual invasive cancer multifocal 0.15 cm, grade 2, 0/3 lymph nodes all negative, ER 90%, PR 60%, HER-2 +3+ by IHC, Ki-67 20%, RCB class I, ympT1a, ypN0  Echo 03/11/2018: EF 60-65%  Current treatment: Anastrozole 1 mg daily to be stopped 09/21/2019 Anastrozole toxicities: 1.  Severe fatigue to the point that she is sleeping all day 2.  Severe joint pains making it difficult for her to do any activities  Based on the symptoms I discussed with her about stopping anastrozole therapy. I would like to see her back with a virtual visit in 3 weeks. At that time we can  decide to switch her from anastrozole to letrozole and start her at half a tablet daily.  Breast cancer surveillance: Mammogram 04/01/2019: No evidence of malignancy breast density category B Bone density January 2021: T score -0.3: Normal   Return to clinic in 3 weeks for a MyChart virtual visit to discuss antiestrogen treatment plan.    No orders of the defined types were placed in this encounter.  The patient has a good understanding of the overall plan. she agrees with it. she will call with any problems that may develop before the next visit here.  Total time spent:  mins including face to face time and time spent for planning, charting and coordination of care  Nicholas Lose, MD 09/21/2019  I, Cloyde Reams Dorshimer, am acting as scribe for Dr. Nicholas Lose.  I have reviewed the above documentation for accuracy and completeness, and I agree with the above.

## 2019-09-21 ENCOUNTER — Inpatient Hospital Stay: Payer: BC Managed Care – PPO | Attending: Hematology and Oncology | Admitting: Hematology and Oncology

## 2019-09-21 ENCOUNTER — Other Ambulatory Visit: Payer: Self-pay

## 2019-09-21 DIAGNOSIS — Z170 Estrogen receptor positive status [ER+]: Secondary | ICD-10-CM

## 2019-09-21 DIAGNOSIS — M2550 Pain in unspecified joint: Secondary | ICD-10-CM | POA: Diagnosis not present

## 2019-09-21 DIAGNOSIS — R5383 Other fatigue: Secondary | ICD-10-CM | POA: Diagnosis not present

## 2019-09-21 DIAGNOSIS — Z9221 Personal history of antineoplastic chemotherapy: Secondary | ICD-10-CM | POA: Insufficient documentation

## 2019-09-21 DIAGNOSIS — Z923 Personal history of irradiation: Secondary | ICD-10-CM | POA: Insufficient documentation

## 2019-09-21 DIAGNOSIS — C50411 Malignant neoplasm of upper-outer quadrant of right female breast: Secondary | ICD-10-CM

## 2019-09-21 NOTE — Assessment & Plan Note (Signed)
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  Treatment plan: 1.Neoadjuvant chemotherapy with [place] Perjeta 12/04/2017-12/26/2019followed by Kadcyla x 1 year completed 12/03/2018 2.breast conserving surgery with sentinel lymph node biopsy: 04/23/2018 3.Radiation therapy: Completed 07/15/2018 4.Adjuvant antiestrogen therapy started 12/15/2018 -------------------------------------------------------------------------------------------------------------------------------- 04/23/2018:Right lumpectomy: Small residual invasive cancer multifocal 0.15 cm, grade 2, 0/3 lymph nodes all negative, ER 90%, PR 60%, HER-2 +3+ by IHC, Ki-67 20%, RCB class I, ympT1a, ypN0  Echo 03/11/2018: EF 60-65%  Current treatment: Anastrozole 1 mg daily Anastrozole toxicities:  Breast cancer surveillance: Mammogram 04/01/2019: No evidence of malignancy breast density category B Bone density January 2021: T score -0.3: Normal   Return to clinic in 1 year for follow-up

## 2019-09-23 ENCOUNTER — Telehealth: Payer: Self-pay | Admitting: Hematology and Oncology

## 2019-09-23 NOTE — Telephone Encounter (Signed)
Scheduled per 6/22 los. Called patient no answer and unable to leave a msg. Mailing appt letter and calendar printout

## 2019-09-27 ENCOUNTER — Telehealth: Payer: Self-pay | Admitting: *Deleted

## 2019-09-27 NOTE — Telephone Encounter (Signed)
Upbeat Study;  Called patient to schedule her 24 months assessments for the Upbeat Study. Informed patient the window to complete this visit is from now until 01/23/20.  Reviewed the activities required including cardiac MRI, fasting labs, neurocognitive testing and questionnaires. Offered patient the option of completing study questionnaires by email and patient opted to complete them in clinic. Patient agreed to schedule this visit for Thursday 11/25/19 in the morning.  Thanked patient and let her know I will call her back with the exact time after cardiac MRI is scheduled. Patient verbalized understanding.  Foye Spurling, BSN, RN Clinical Research Nurse 09/27/2019 4:36 PM

## 2019-09-28 ENCOUNTER — Telehealth: Payer: Self-pay | Admitting: *Deleted

## 2019-09-28 ENCOUNTER — Other Ambulatory Visit (HOSPITAL_COMMUNITY): Payer: Self-pay | Admitting: Hematology and Oncology

## 2019-09-28 DIAGNOSIS — Z006 Encounter for examination for normal comparison and control in clinical research program: Secondary | ICD-10-CM

## 2019-09-28 NOTE — Telephone Encounter (Signed)
Upbeat Study; LVM for pt informing MRI scheduled for 10 am on 11/25/19 and she will need to arrive by 9:30 am.  Lab will be scheduled for 11 am, so nothing to eat or drink (except water) after 8 am.  Research appointment will follow.  Asked patient to call back if she cannot make this appointment, otherwise research nurse will call her again closer to 11/25/19 to remind her of visit and instructions.  Foye Spurling, BSN, RN Clinical Research Nurse 09/28/2019 11:12 AM

## 2019-10-18 NOTE — Progress Notes (Signed)
HEMATOLOGY-ONCOLOGY MYCHART VIDEO VISIT PROGRESS NOTE  I connected with Sandra [name] on 10/19/2019 at 10:00 AM EDT by MyChart video conference and verified that I am speaking with the correct person using two identifiers.  I discussed the limitations, risks, security and privacy concerns of performing an evaluation and management service by MyChart and the availability of in person appointments.  I also discussed with the patient that there may be a patient responsible charge related to this service. The patient expressed understanding and agreed to proceed.  Patient's Location: Home Physician Location: Clinic  CHIEF COMPLIANT: Follow-up of right breast cancer to discuss alternative antiestrogen therapy  INTERVAL HISTORY: Sandra [name] is a 54 y.o. female with above-mentioned history of HER-2 positive right breast cancer treated with neoadjuvant chemotherapy, right lumpectomy, Kadycla maintenance, and was on antiestrogen therapy with anastrozole, but it was discontinued due severe fatigue and joint pains. She presents over MyChart todayfor follow-up.  Since stopping anastrozole therapy, she feels markedly better.  Fatigue is still there but it is not as severe.  The muscle aches and pains are better.  Her fogginess in the mind is also improving.  She can still go to sleep and not wake up for a day easily.  Oncology History  Malignant neoplasm of upper-outer quadrant of right breast in female, estrogen receptor positive ([place])  11/11/2017 Initial Diagnosis   Palpable right breast mass at 10 o'clock position 1.7 cm, axilla negative, biopsy revealed grade 3 IDC ER 60%, PR 10%, Ki-67 70%, HER-2 positive, T1 CN 0 stage Ia   11/19/2017 Cancer Staging   Staging form: Breast, AJCC 8th Edition - Clinical: Stage IB (cT2, cN0, cM0, G3, ER+, PR+, HER2+) - Signed by Nicholas Lose, MD on 12/04/2017   11/25/2017 Breast MRI   4.4 x 2.8 x 2.1 cm right breast malignancy UOQ, second focus 0.8 cm (biopsy  planned for 12/09/2017)   12/04/2017 - 03/26/2018 Neo-Adjuvant Chemotherapy   TCH Perjeta every 3 week x6 followed by Herceptin Perjeta maintenance   01/04/2018 Genetic Testing   Negative genetic testing on the multi-cancer panel.  The Multi-Gene Panel offered by [facility] includes sequencing and/or deletion duplication testing of the following 84 genes: AIP, ALK, APC, ATM, AXIN2,BAP1,  BARD1, BLM, BMPR1A, BRCA1, BRCA2, BRIP1, CASR, CDC73, CDH1, CDK4, CDKN1B, CDKN1C, CDKN2A (p14ARF), CDKN2A (p16INK4a), CEBPA, CHEK2, CTNNA1, DICER1, DIS3L2, EGFR (c.2369C>T, p.Thr790Met variant only), EPCAM (Deletion/duplication testing only), FH, FLCN, GATA2, GPC3, GREM1 (Promoter region deletion/duplication testing only), HOXB13 (c.251G>A, p.Gly84Glu), HRAS, KIT, MAX, MEN1, MET, MITF (c.952G>A, p.Glu318Lys variant only), MLH1, MSH2, MSH3, MSH6, MUTYH, NBN, NF1, NF2, NTHL1, PALB2, PDGFRA, PHOX2B, PMS2, POLD1, POLE, POT1, PRKAR1A, PTCH1, PTEN, RAD50, RAD51C, RAD51D, RB1, RECQL4, RET, RUNX1, SDHAF2, SDHA (sequence changes only), SDHB, SDHC, SDHD, SMAD4, SMARCA4, SMARCB1, SMARCE1, STK11, SUFU, TERC, TERT, TMEM127, TP53, TSC1, TSC2, VHL, WRN and WT1.  The report date is January 04, 2018.   04/23/2018 Surgery   Right lumpectomy ([facility]) [phone]): Small residual invasive cancer multifocal 0.15 cm, grade 2, 0/3 lymph nodes all negative, ER 90%, PR 60%, HER-2 +3+ by IHC, Ki-67 20%, RCB class I, ympT1a, ypN0   05/07/2018 -  Chemotherapy   The patient had ado-trastuzumab emtansine (KADCYLA) 300 mg in sodium chloride 0.9 % 250 mL chemo infusion, 3.6 mg/kg = 300 mg, Intravenous, Once, 11 of 11 cycles Dose modification: 2.4 mg/kg (original dose 3.6 mg/kg, Cycle 8, Reason: Dose not tolerated) Administration: 300 mg (05/07/2018), 300 mg (05/28/2018), 300 mg (07/30/2018), 300 mg (08/20/2018), 300 mg (09/10/2018), 200 mg (  10/01/2018), 200 mg (10/22/2018), 200 mg (11/12/2018), 200 mg (12/03/2018), 300 mg (06/18/2018), 300 mg (07/09/2018)  for chemotherapy  treatment.    06/01/2018 - 07/15/2018 Radiation Therapy   The patient initially received a dose of 50.4 Gy in 28 fractions to the right breast using whole-breast tangent fields. This was delivered using a 3-D conformal technique. The patient then received a boost to the seroma. This delivered an additional 10 Gy in 5 fractions using 6X photons with a Complex Isodose technique. The total dose was 60.4 Gy.   12/2018 - 12/2023 Anti-estrogen oral therapy   Anastrozole     Observations/Objective:  There were no vitals filed for this visit. There is no height or weight on file to calculate BMI.  I have reviewed the data as listed CMP Latest Ref Rng & Units 12/22/2018 12/03/2018 11/12/2018  Glucose 70 - 99 mg/dL 88 82 83  BUN 6 - 20 mg/dL '14 17 17  ' Creatinine 0.44 - 1.00 mg/dL 0.99 0.89 0.93  Sodium 135 - 145 mmol/L 141 139 138  Potassium 3.5 - 5.1 mmol/L 4.3 3.8 3.8  Chloride 98 - 111 mmol/L 103 104 103  CO2 22 - 32 mmol/L '29 26 28  ' Calcium 8.9 - 10.3 mg/dL 9.6 9.4 9.6  Total Protein 6.5 - 8.1 g/dL 7.4 7.6 7.4  Total Bilirubin 0.3 - 1.2 mg/dL 0.4 0.3 0.3  Alkaline Phos 38 - 126 U/L 68 68 71  AST 15 - 41 U/L 38 29 27  ALT 0 - 44 U/L 38 26 27    Lab Results  Component Value Date   WBC 3.4 (L) 12/22/2018   HGB 12.2 12/22/2018   HCT 36.3 12/22/2018   MCV 90.8 12/22/2018   PLT 121 (L) 12/22/2018   NEUTROABS 1.7 12/22/2018      Assessment Plan:  Malignant neoplasm of upper-outer quadrant of right breast in female, estrogen receptor positive ([place]) 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%, PR 10%, Ki-67 70%, HER-2 positive, T1 CN 0 stage Ia Breast MRI 11/25/2017: 4.4 x 2.8 x 2.1 cm right breast malignancy UOQ, second focus 0.8 cm(biopsy plannedfor 12/09/2017), overall extent of disease is 7 cm 12/12/2017:Biopsy results of the MRI guided biopsies came back positive for triple positive invasive ductal  carcinoma  Treatment plan: 1.Neoadjuvant chemotherapy with [place] Perjeta 12/04/2017-12/26/2019followed by Kadcyla x 1 year completed 12/03/2018 2.breast conserving surgery with sentinel lymph node biopsy: 04/23/2018 3.Radiation therapy: Completed 07/15/2018 4.Adjuvant antiestrogen therapy started 12/15/2018 -------------------------------------------------------------------------------------------------------------------------------- 04/23/2018:Right lumpectomy: Small residual invasive cancer multifocal 0.15 cm, grade 2, 0/3 lymph nodes all negative, ER 90%, PR 60%, HER-2 +3+ by IHC, Ki-67 20%, RCB class I, ympT1a, ypN0  Echo 03/11/2018: EF 60-65%  Current treatment: Anastrozole 1 mg daily stopped 09/21/2019 because of severe fatigue and severe joint pains Fatigue is still persistent although it is improving therefore I discussed holding off on taking any antiestrogen therapy for another month and starting on half a tablet of letrozole daily from 11/19/2019.  Breast cancer surveillance: Mammogram 04/01/2019: No evidence of malignancy breast density category B Bone density January 2021: T score -0.3: Normal   MyChart virtual visit in September to discuss tolerability to letrozole.  I discussed the assessment and treatment plan with the patient. The patient was provided an opportunity to ask questions and all were answered. The patient agreed with the plan and demonstrated an understanding of the instructions. The patient was advised to call back or seek  an in-person evaluation if the symptoms worsen or if the condition fails to improve as anticipated.   I provided 20 minutes of face-to-face MyChart video visit time during this encounter.    Rulon Eisenmenger, MD 10/19/2019   I, Molly Dorshimer, am acting as scribe for Nicholas Lose, MD.  I have reviewed the above documentation for accuracy and completeness, and I agree with the above.

## 2019-10-19 ENCOUNTER — Inpatient Hospital Stay: Payer: BC Managed Care – PPO | Attending: Hematology and Oncology | Admitting: Hematology and Oncology

## 2019-10-19 DIAGNOSIS — C50411 Malignant neoplasm of upper-outer quadrant of right female breast: Secondary | ICD-10-CM | POA: Diagnosis not present

## 2019-10-19 DIAGNOSIS — Z170 Estrogen receptor positive status [ER+]: Secondary | ICD-10-CM

## 2019-10-19 MED ORDER — LETROZOLE 2.5 MG PO TABS: 1.2500 mg | ORAL_TABLET | Freq: Every day | ORAL | 0 refills | Status: DC

## 2019-10-19 NOTE — Assessment & Plan Note (Signed)
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  Treatment plan: 1.Neoadjuvant chemotherapy with [place] Perjeta 12/04/2017-12/26/2019followed by Kadcyla x 1 year completed 12/03/2018 2.breast conserving surgery with sentinel lymph node biopsy: 04/23/2018 3.Radiation therapy: Completed 07/15/2018 4.Adjuvant antiestrogen therapy started 12/15/2018 -------------------------------------------------------------------------------------------------------------------------------- 04/23/2018:Right lumpectomy: Small residual invasive cancer multifocal 0.15 cm, grade 2, 0/3 lymph nodes all negative, ER 90%, PR 60%, HER-2 +3+ by IHC, Ki-67 20%, RCB class I, ympT1a, ypN0  Echo 03/11/2018: EF 60-65%  Current treatment: Anastrozole 1 mg daily stopped 09/21/2019 because of severe fatigue and severe joint pains  At that time we can decide to switch her from anastrozole to letrozole and start her at half a tablet daily.  Breast cancer surveillance: Mammogram 04/01/2019: No evidence of malignancy breast density category B Bone density January 2021: T score -0.3: Normal

## 2019-10-21 ENCOUNTER — Telehealth: Payer: Self-pay | Admitting: Hematology and Oncology

## 2019-10-21 NOTE — Telephone Encounter (Signed)
Scheduled per 7/20 los. Called and left a msg, mailing appt letter and calendar printout

## 2019-10-28 ENCOUNTER — Telehealth: Payer: Self-pay | Admitting: Hematology and Oncology

## 2019-10-28 NOTE — Telephone Encounter (Signed)
Called pt per 7/29 sch msg - unable to reach pt . Left message for patient to call back to reschedule.

## 2019-11-15 ENCOUNTER — Other Ambulatory Visit (HOSPITAL_COMMUNITY): Payer: Self-pay | Admitting: Psychiatry

## 2019-11-15 DIAGNOSIS — F330 Major depressive disorder, recurrent, mild: Secondary | ICD-10-CM

## 2019-11-15 DIAGNOSIS — F411 Generalized anxiety disorder: Secondary | ICD-10-CM

## 2019-11-18 ENCOUNTER — Telehealth (HOSPITAL_COMMUNITY): Payer: Self-pay | Admitting: Psychiatry

## 2019-11-18 ENCOUNTER — Telehealth: Payer: BC Managed Care – PPO | Admitting: Hematology and Oncology

## 2019-11-24 ENCOUNTER — Telehealth: Payer: Self-pay | Admitting: *Deleted

## 2019-11-24 NOTE — Telephone Encounter (Signed)
Called patient to confirm her research appointments scheduled for tomorrow. Reminded patient to fast for at least 3 hours prior to lab appt scheduled at 11 am.  Asked patient to arrive to lobby at [facility] a little before 9:30 am and I will plan to meet patient in lobby and walk her over to the Radiology department to check in for the MRI.  Patient verbalized understanding and agreed to this plan. Thanked patient for her time and look forward to seeing her tomorrow.  Foye Spurling, BSN, RN Clinical Research Nurse 11/24/2019 10:13 AM

## 2019-11-25 ENCOUNTER — Ambulatory Visit (HOSPITAL_COMMUNITY)
Admission: RE | Admit: 2019-11-25 | Discharge: 2019-11-25 | Disposition: A | Discharge status: Home / Self Care | Payer: Self-pay | Source: Ambulatory Visit | Attending: Hematology and Oncology | Admitting: Hematology and Oncology

## 2019-11-25 ENCOUNTER — Other Ambulatory Visit: Payer: Self-pay

## 2019-11-25 ENCOUNTER — Inpatient Hospital Stay: Payer: Self-pay

## 2019-11-25 ENCOUNTER — Encounter: Payer: Self-pay | Admitting: *Deleted

## 2019-11-25 ENCOUNTER — Inpatient Hospital Stay: Payer: Self-pay | Attending: Hematology and Oncology | Admitting: *Deleted

## 2019-11-25 DIAGNOSIS — C50411 Malignant neoplasm of upper-outer quadrant of right female breast: Secondary | ICD-10-CM

## 2019-11-25 DIAGNOSIS — Z006 Encounter for examination for normal comparison and control in clinical research program: Secondary | ICD-10-CM

## 2019-11-25 DIAGNOSIS — Z170 Estrogen receptor positive status [ER+]: Secondary | ICD-10-CM

## 2019-11-25 LAB — RESEARCH LABS

## 2019-11-25 NOTE — Research (Signed)
[id number] Upbeat 24 Months Visit:  Patient into clinic by herself this morning to complete activities for Upbeat study.  PROs; Given to patient to complete in lobby prior to other appointments. Research nurse collected and checked for completeness and accuracy.   Cardiac MRI; Completed per protocol. Labs; Collected per protocol.  Patient confirmed she had been fasting for more than 3 hours.  VS; Collected per protocol after resting for 5 minutes.  Height and weight without shoes. See VS flowsheet Neurocognitive testing; Completed by Farris Has, research assistant.  Medication review; Reviewed medication list with patient and updated in EMR.  Waist measurement;  41.5 inches, measured per instructions on CRF15. Physical Functions testing; Completed per protocol without difficulty and patient tolerated well.  Cardiovascular Events; Patient denies any hospitalizations or cardiovascular events since last study visit.  Gift Card; [phone] [employer] gift card given to patient for completing study activities for this time point. Plan;  Thanked patient for her participation in this study. Reminded patient this was the last in person study visit. She will receive phone calls from our research department every year for the next 9 years to complete the study. Research nurse will contact patient with any abnormal lab or MRI results. She verbalized understanding.  Foye Spurling, BSN, RN Clinical Research Nurse 11/25/2019

## 2019-11-29 ENCOUNTER — Telehealth: Payer: Self-pay | Admitting: *Deleted

## 2019-11-29 NOTE — Telephone Encounter (Signed)
Upbeat Lab results; Informed patient of research lab results show elevated total cholesterol and LDL levels. This is not changed significantly from baseline study labs 2 years ago. Dr Lindi Adie has reviewed. Copies of labs placed in mail to patient for her records and to share with PCP.   Informed patient that research will notify her if any abnormal findings are reported on the cardiac MRI. Patient verbalized understanding.  Foye Spurling, BSN, RN Clinical Research Nurse 11/29/2019 12:12 PM

## 2019-12-05 ENCOUNTER — Other Ambulatory Visit: Payer: Self-pay | Admitting: Hematology and Oncology

## 2019-12-08 NOTE — Progress Notes (Signed)
HEMATOLOGY-ONCOLOGY MYCHART VIDEO VISIT PROGRESS NOTE  I connected with Cleora Fleet Wesenberg on 12/09/2019 at  1:45 PM EDT by MyChart video conference and verified that I am speaking with the correct person using two identifiers.  I discussed the limitations, risks, security and privacy concerns of performing an evaluation and management service by MyChart and the availability of in person appointments.  I also discussed with the patient that there may be a patient responsible charge related to this service. The patient expressed understanding and agreed to proceed.  Patient's Location: Home Physician Location: Clinic  CHIEF COMPLIANT: Follow-up of right breast cancer to discuss alternative antiestrogen therapy  INTERVAL HISTORY: Sandra [name] is a 54 y.o. female with above-mentioned history of HER-2 positive right breast cancer treated with neoadjuvant chemotherapy with [place], right lumpectomy, Kadycla maintenance, and who discontinued antiestrogen therapy with anastrozole due to severe fatigue and joint pain. She presents over MyChart todayfor follow-up.  She tells me that she is tolerating half a tablet of letrozole much better.  She no longer has muscle aches and pains.  She however continues to suffer from severe fatigue.  Because of this she has not been very active and she thinks that is the reason why she has been gaining weight as well.  Oncology History  Malignant neoplasm of upper-outer quadrant of right breast in female, estrogen receptor positive ([place])  11/11/2017 Initial Diagnosis   Palpable right breast mass at 10 o'clock position 1.7 cm, axilla negative, biopsy revealed grade 3 IDC ER 60%, PR 10%, Ki-67 70%, HER-2 positive, T1 CN 0 stage Ia   11/19/2017 Cancer Staging   Staging form: Breast, AJCC 8th Edition - Clinical: Stage IB (cT2, cN0, cM0, G3, ER+, PR+, HER2+) - Signed by Nicholas Lose, MD on 12/04/2017   11/25/2017 Breast MRI   4.4 x 2.8 x 2.1 cm right breast  malignancy UOQ, second focus 0.8 cm (biopsy planned for 12/09/2017)   12/04/2017 - 03/26/2018 Neo-Adjuvant Chemotherapy   TCH Perjeta every 3 week x6 followed by Herceptin Perjeta maintenance   01/04/2018 Genetic Testing   Negative genetic testing on the multi-cancer panel.  The Multi-Gene Panel offered by [facility] includes sequencing and/or deletion duplication testing of the following 84 genes: AIP, ALK, APC, ATM, AXIN2,BAP1,  BARD1, BLM, BMPR1A, BRCA1, BRCA2, BRIP1, CASR, CDC73, CDH1, CDK4, CDKN1B, CDKN1C, CDKN2A (p14ARF), CDKN2A (p16INK4a), CEBPA, CHEK2, CTNNA1, DICER1, DIS3L2, EGFR (c.2369C>T, p.Thr790Met variant only), EPCAM (Deletion/duplication testing only), FH, FLCN, GATA2, GPC3, GREM1 (Promoter region deletion/duplication testing only), HOXB13 (c.251G>A, p.Gly84Glu), HRAS, KIT, MAX, MEN1, MET, MITF (c.952G>A, p.Glu318Lys variant only), MLH1, MSH2, MSH3, MSH6, MUTYH, NBN, NF1, NF2, NTHL1, PALB2, PDGFRA, PHOX2B, PMS2, POLD1, POLE, POT1, PRKAR1A, PTCH1, PTEN, RAD50, RAD51C, RAD51D, RB1, RECQL4, RET, RUNX1, SDHAF2, SDHA (sequence changes only), SDHB, SDHC, SDHD, SMAD4, SMARCA4, SMARCB1, SMARCE1, STK11, SUFU, TERC, TERT, TMEM127, TP53, TSC1, TSC2, VHL, WRN and WT1.  The report date is January 04, 2018.   04/23/2018 Surgery   Right lumpectomy ([place]) [phone]): Small residual invasive cancer multifocal 0.15 cm, grade 2, 0/3 lymph nodes all negative, ER 90%, PR 60%, HER-2 +3+ by IHC, Ki-67 20%, RCB class I, ympT1a, ypN0   05/07/2018 - 12/03/2018 Chemotherapy   The patient had ado-trastuzumab emtansine (KADCYLA) 300 mg in sodium chloride 0.9 % 250 mL chemo infusion, 3.6 mg/kg = 300 mg, Intravenous, Once, 11 of 11 cycles Dose modification: 2.4 mg/kg (original dose 3.6 mg/kg, Cycle 8, Reason: Dose not tolerated) Administration: 300 mg (05/07/2018), 300 mg (05/28/2018), 300 mg (07/30/2018), 300  mg (08/20/2018), 300 mg (09/10/2018), 200 mg (10/01/2018), 200 mg (10/22/2018), 200 mg (11/12/2018), 200 mg (12/03/2018), 300 mg  (06/18/2018), 300 mg (07/09/2018)  for chemotherapy treatment.    06/01/2018 - 07/15/2018 Radiation Therapy   The patient initially received a dose of 50.4 Gy in 28 fractions to the right breast using whole-breast tangent fields. This was delivered using a 3-D conformal technique. The patient then received a boost to the seroma. This delivered an additional 10 Gy in 5 fractions using 6X photons with a Complex Isodose technique. The total dose was 60.4 Gy.   12/2018 - 12/2023 Anti-estrogen oral therapy   Anastrozole     Observations/Objective:  There were no vitals filed for this visit. There is no height or weight on file to calculate BMI.  I have reviewed the data as listed CMP Latest Ref Rng & Units 12/22/2018 12/03/2018 11/12/2018  Glucose 70 - 99 mg/dL 88 82 83  BUN 6 - 20 mg/dL '14 17 17  ' Creatinine 0.44 - 1.00 mg/dL 0.99 0.89 0.93  Sodium 135 - 145 mmol/L 141 139 138  Potassium 3.5 - 5.1 mmol/L 4.3 3.8 3.8  Chloride 98 - 111 mmol/L 103 104 103  CO2 22 - 32 mmol/L '29 26 28  ' Calcium 8.9 - 10.3 mg/dL 9.6 9.4 9.6  Total Protein 6.5 - 8.1 g/dL 7.4 7.6 7.4  Total Bilirubin 0.3 - 1.2 mg/dL 0.4 0.3 0.3  Alkaline Phos 38 - 126 U/L 68 68 71  AST 15 - 41 U/L 38 29 27  ALT 0 - 44 U/L 38 26 27    Lab Results  Component Value Date   WBC 3.4 (L) 12/22/2018   HGB 12.2 12/22/2018   HCT 36.3 12/22/2018   MCV 90.8 12/22/2018   PLT 121 (L) 12/22/2018   NEUTROABS 1.7 12/22/2018      Assessment Plan:  Malignant neoplasm of upper-outer quadrant of right breast in female, estrogen receptor positive ([place]) 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%, PR 10%, Ki-67 70%, HER-2 positive, T1 CN 0 stage Ia Breast MRI 11/25/2017: 4.4 x 2.8 x 2.1 cm right breast malignancy UOQ, second focus 0.8 cm(biopsy plannedfor 12/09/2017), overall extent of disease is 7 cm 12/12/2017:Biopsy results of the MRI guided biopsies came back positive for  triple positive invasive ductal carcinoma  Treatment plan: 1.Neoadjuvant chemotherapy with [place] Perjeta 12/04/2017-12/26/2019followed by Kadcyla x 1 yearcompleted 12/03/2018 2.breast conserving surgery with sentinel lymph node biopsy: 04/23/2018 3.Radiation therapy:Completed4/15/2020 4.Adjuvant antiestrogen therapystarted 12/15/2018 -------------------------------------------------------------------------------------------------------------------------------- 04/23/2018:Right lumpectomy: Small residual invasive cancer multifocal 0.15 cm, grade 2, 0/3 lymph nodes all negative, ER 90%, PR 60%, HER-2 +3+ by IHC, Ki-67 20%, RCB class I, ympT1a, ypN0  Echo 03/11/2018: EF 60-65%  Current treatment:Anastrozole 1 mg daily stopped 09/21/2019 because of severe fatigue and severe joint pains Fatigue is still persistent although it is improving therefore I discussed holding off on taking any antiestrogen therapy for another month and starting on half a tablet of letrozole daily from 11/19/2019.  Letrozole toxicities: Muscle aches and pains have disappeared at half a tablet daily Continues to suffer from fatigue issues.  Breast cancer surveillance: Mammogram 04/01/2019: No evidence of malignancy breast density category B Bone density January 2021: T score -0.3: Normal   Weight issues: Patient currently weighs 227 pounds and she wants to aggressively work on losing weight.  I gave her several options and she will make up her decision and when we meet again  in 6 months she wants to sterilize some improvement in her weight.  Follow-up in 6 months   I discussed the assessment and treatment plan with the patient. The patient was provided an opportunity to ask questions and all were answered. The patient agreed with the plan and demonstrated an understanding of the instructions. The patient was advised to call back or seek an in-person evaluation if the symptoms worsen or if the condition fails to  improve as anticipated.   I provided 20 minutes of face-to-face MyChart video visit time along with charting and documentation and coordination of care during this encounter.    Rulon Eisenmenger, MD 12/09/2019   I, Molly Dorshimer, am acting as scribe for Nicholas Lose, MD.  I have reviewed the above documentation for accuracy and completeness, and I agree with the above.

## 2019-12-09 ENCOUNTER — Inpatient Hospital Stay: Payer: BC Managed Care – PPO | Attending: Hematology and Oncology | Admitting: Hematology and Oncology

## 2019-12-09 DIAGNOSIS — C50411 Malignant neoplasm of upper-outer quadrant of right female breast: Secondary | ICD-10-CM

## 2019-12-09 DIAGNOSIS — Z170 Estrogen receptor positive status [ER+]: Secondary | ICD-10-CM | POA: Diagnosis not present

## 2019-12-09 NOTE — Assessment & Plan Note (Signed)
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  Treatment plan: 1.Neoadjuvant chemotherapy with [place] Perjeta 12/04/2017-12/26/2019followed by Kadcyla x 1 yearcompleted 12/03/2018 2.breast conserving surgery with sentinel lymph node biopsy: 04/23/2018 3.Radiation therapy:Completed4/15/2020 4.Adjuvant antiestrogen therapystarted 12/15/2018 -------------------------------------------------------------------------------------------------------------------------------- 04/23/2018:Right lumpectomy: Small residual invasive cancer multifocal 0.15 cm, grade 2, 0/3 lymph nodes all negative, ER 90%, PR 60%, HER-2 +3+ by IHC, Ki-67 20%, RCB class I, ympT1a, ypN0  Echo 03/11/2018: EF 60-65%  Current treatment:Anastrozole 1 mg daily stopped 09/21/2019 because of severe fatigue and severe joint pains Fatigue is still persistent although it is improving therefore I discussed holding off on taking any antiestrogen therapy for another month and starting on half a tablet of letrozole daily from 11/19/2019.  Letrozole toxicities:   Breast cancer surveillance: Mammogram 04/01/2019: No evidence of malignancy breast density category B Bone density January 2021: T score -0.3: Normal   MyChart virtual visit in September to discuss tolerability to letrozole.

## 2019-12-10 ENCOUNTER — Encounter (HOSPITAL_COMMUNITY): Payer: Self-pay | Admitting: Psychiatry

## 2019-12-10 ENCOUNTER — Telehealth (INDEPENDENT_AMBULATORY_CARE_PROVIDER_SITE_OTHER): Payer: BC Managed Care – PPO | Admitting: Psychiatry

## 2019-12-10 ENCOUNTER — Other Ambulatory Visit: Payer: Self-pay

## 2019-12-10 DIAGNOSIS — F411 Generalized anxiety disorder: Secondary | ICD-10-CM | POA: Diagnosis not present

## 2019-12-10 DIAGNOSIS — F330 Major depressive disorder, recurrent, mild: Secondary | ICD-10-CM

## 2019-12-10 MED ORDER — DULOXETINE HCL 60 MG PO CPEP: 60.0000 mg | ORAL_CAPSULE | Freq: Every day | ORAL | 0 refills | Status: DC

## 2019-12-10 NOTE — Progress Notes (Signed)
Virtual Visit via Telephone Note  I connected with Sandra [name] on 12/10/19 at 10:20 AM EDT by telephone and verified that I am speaking with the correct person using two identifiers.  Location: Patient: home Provider:home office   I discussed the limitations, risks, security and privacy concerns of performing an evaluation and management service by telephone and the availability of in person appointments. I also discussed with the patient that there may be a patient responsible charge related to this service. The patient expressed understanding and agreed to proceed.   History of Present Illness: Patient is evaluated by phone session.  She is taking her medication but sometimes she feels it is making her zombie.  She has no feelings.  She has no energy, motivation to do things.  Even when she went to [facility] she did not enjoy as much.  She is also very concerned about weight gain.  Patient reported that she works, eat and sleep.  She does not feel happy with her life.  She is in therapy with Cloyde Reams breeze twice a month.  She had stopped taking over-the-counter melatonin because it was making her very groggy.  She was taking 10 mg melatonin.  Patient denies any paranoia, hallucination, suicidal thoughts, but admitted not happy with her current life because she does not have any motivation to do things.  Her daughter is doing better.  Her daughter is seeing therapist and taking medication.  She started in person in school.  Patient's daughter is also going well.  September was liked month for her.  She is working in person.  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  She has no tremors or shakes.  She recently had a visit with Dr. Lindi Adie.  She is compliant with Cymbalta and Abilify 2.5 mg.    Past Psychiatric History: [facility] Tried Paxil, Lexapro, ativan and trazadonebut don'tremember. Lamictal did not help and Wellbutrin causeddepression. We tried  EffexorandProzac butdid not help. Noh/opsychosis, suicidal attempt or Inpatient.  Psychiatric Specialty Exam: Physical Exam  Review of Systems  Weight 227 lb (103 kg).There is no height or weight on file to calculate BMI.  General Appearance: NA  Eye Contact:  NA  Speech:  Slow  Volume:  Decreased  Mood:  Dysphoric and Hopeless  Affect:  NA  Thought Process:  Descriptions of Associations: Intact  Orientation:  Full (Time, Place, and Person)  Thought Content:  Rumination  Suicidal Thoughts:  No  Homicidal Thoughts:  No  Memory:  Immediate;   Good Recent;   Good Remote;   Good  Judgement:  Intact  Insight:  Present  Psychomotor Activity:  NA  Concentration:  Concentration: Good and Attention Span: Good  Recall:  Good  Fund of Knowledge:  Good  Language:  Good  Akathisia:  No  Handed:  Right  AIMS (if indicated):     Assets:  Communication Skills Desire for Improvement Housing Talents/Skills  ADL's:  Intact  Cognition:  WNL  Sleep:   10hrs    Assessment and Plan: Major depressive disorder, recurrent.  Generalized anxiety disorder.  I had a long discussion with the patient about medication side effects.  We discussed Abilify can cause zombie feeling, weight gain but also helping her depression.  After some discussion we agreed to stop the Abilify to see if her feeling of tiredness and zombie feeling goes away.  I also recommend to try taking Cymbalta 60 mg in the morning instead of nighttime and recommend to  try low-dose melatonin since she feels it does work but it make her very sleepy next day.  In the past she had tried Paxil, Prozac, Effexor, Wellbutrin, Lamictal, trazodone and Lexapro.  If patient feel her depression is coming back we may consider switching to low-dose Latuda or Rexulti.  Recommended to call us back if there is any question or any concern.  Follow-up in 4 weeks.  Follow Up Instructions:    I discussed the assessment and treatment plan with the  patient. The patient was provided an opportunity to ask questions and all were answered. The patient agreed with the plan and demonstrated an understanding of the instructions.   The patient was advised to call back or seek an in-person evaluation if the symptoms worsen or if the condition fails to improve as anticipated.  I provided 30 minutes of non-face-to-face time during this encounter.   Kathlee Nations, MD

## 2019-12-11 IMAGING — MR MR BILATERAL BREAST WITHOUT AND WITH CONTRAST
5 of 14 series · 18 of 48 positions shown · IV contrast (Yes)
Comparison: Previous mammogram, ultrasound and biopsy examinations.

ADDENDUM:
I spoke with Tang Brubaker and she reported that Dr. Fell would like
to have the anterior extent of disease of the biopsy-proven
malignancy documented as well as biopsy of the 0.8 cm mass seen more
anteriorly in the upper outer quadrant of the right breast.
Therefore, MR guided core needle biopsy of the 0.8 cm mass more
anteriorly in the upper outer quadrant of the breast is recommended
as well as MR guided core needle biopsy of a more focal anterior,
lateral component of the biopsy-proven malignancy. This component is
located in the midportion of the upper-outer quadrant of the right
breast and measures 1.1 cm in maximum diameter on PACS image number
129 of series 03130 at table position [phone].
CLINICAL DATA: Recently diagnosed grade 3 invasive ductal carcinoma
and high-grade ductal carcinoma in situ in the 10 o'clock position
of the right breast.

LABS:  None obtained today.
EXAM:
BILATERAL BREAST MRI WITH AND WITHOUT CONTRAST
TECHNIQUE: Multiplanar, multisequence MR images of both breasts were obtained
prior to and following the intravenous administration of 19 ml of
MultiHance.
Three-dimensional MR images were rendered by post-processing the
original MR data using the DynaCAD thin client. The 3D MR images are
interpreted and the findings are included in the complete MRI report
below.

[Series 4: ax ir · axial · 3.0mm · 0.70mm/px · 1 of 75 slices shown (1 of 2)]
[im 1/75]
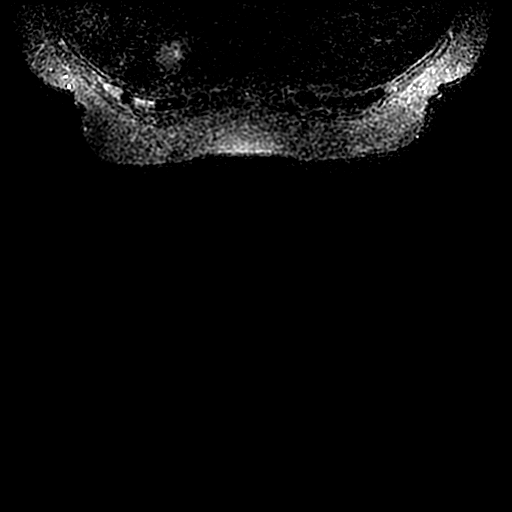

[Series 5: ax ir · axial · 3.0mm · 0.70mm/px · 1 of 75 slices shown (2 of 2)]
[im 1/75]
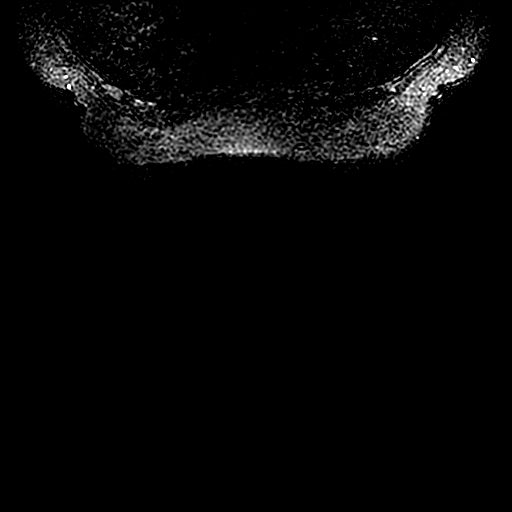

[Series 600: vibrant mph +c · axial · 1.8mm · 0.70mm/px · z∈[-123,+99]mm · 5 of 247 slices shown]
[im 1/247]
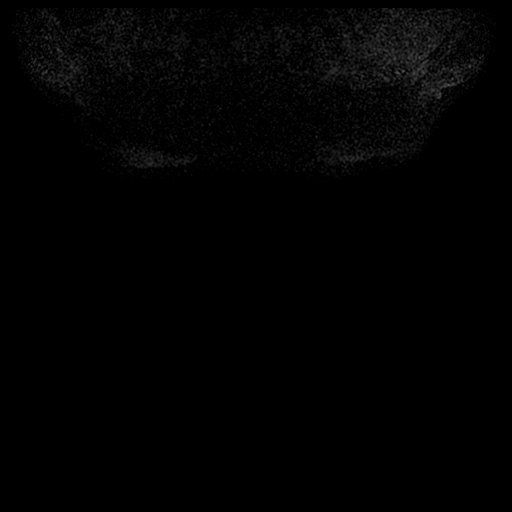
[im 62/247]
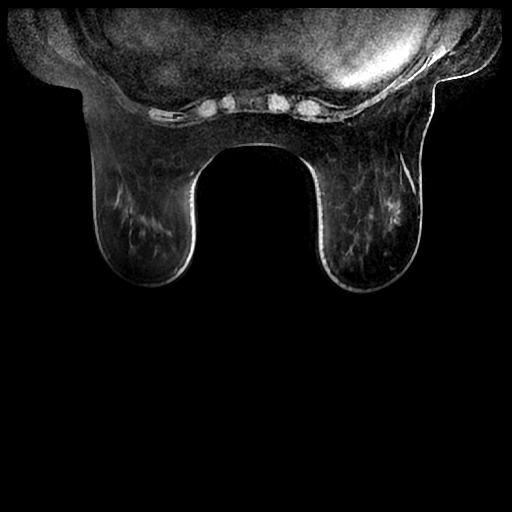
[im 124/247]
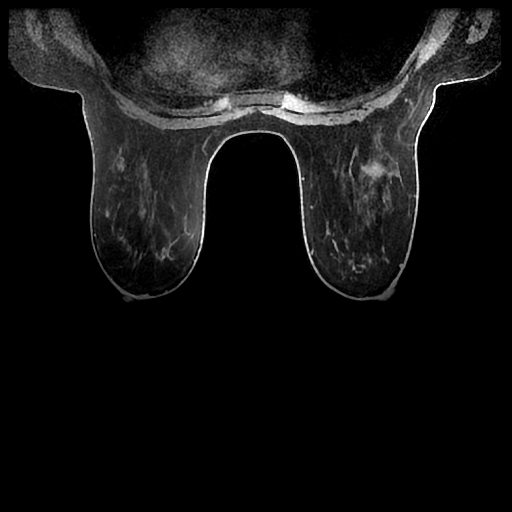
[im 185/247]
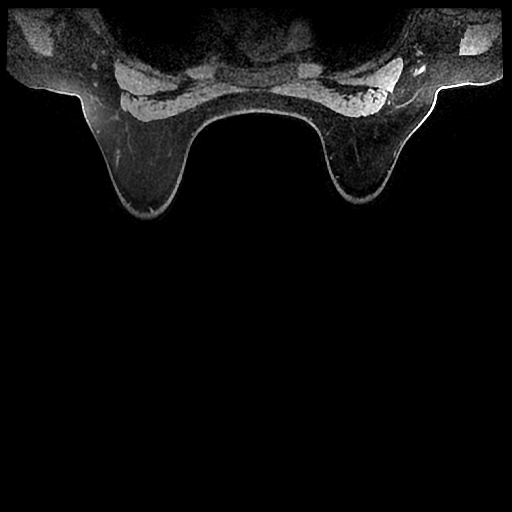
[im 247/247]
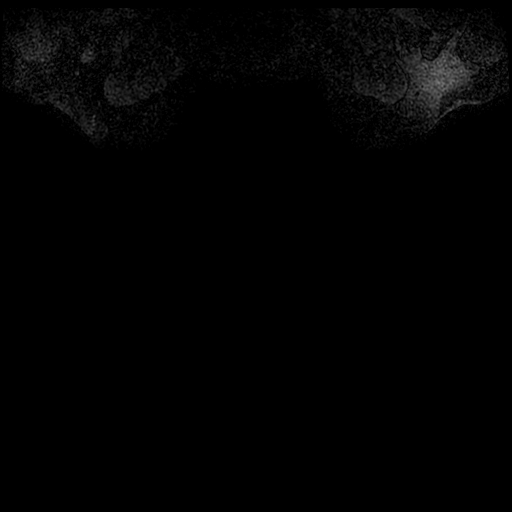

[Series 601: ph1/ vibrant mph · axial · 1.8mm · 0.70mm/px · z∈[-123,+99]mm · 5 of 248 slices shown]
[im 1/248]
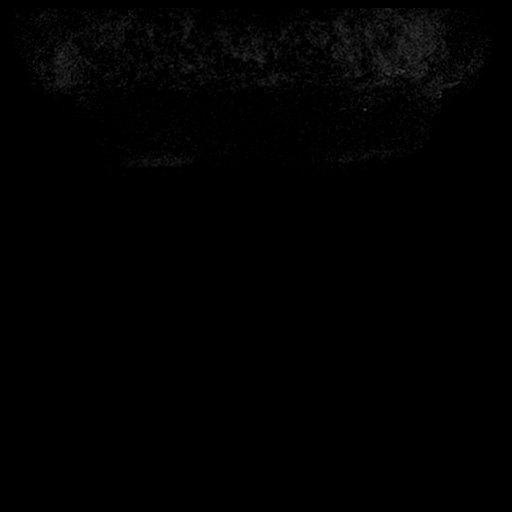
[im 62/248]
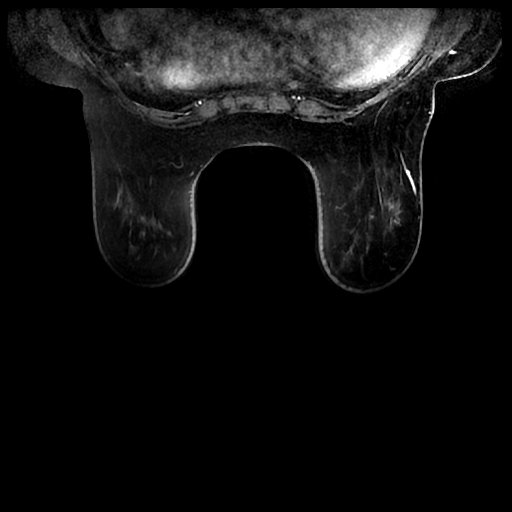
[im 124/248]
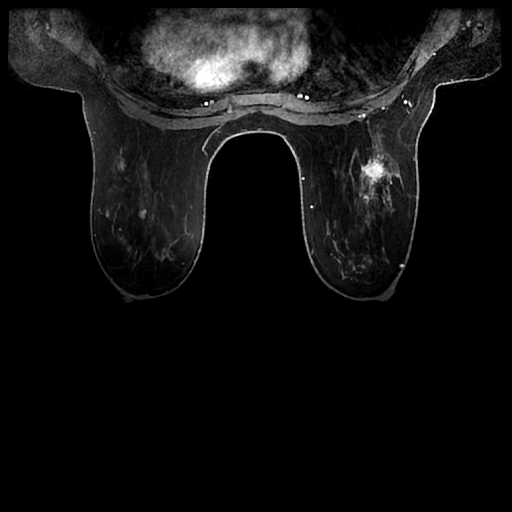
[im 186/248]
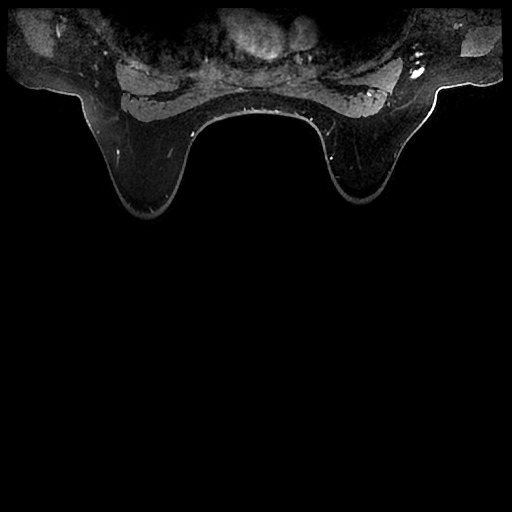
[im 248/248]
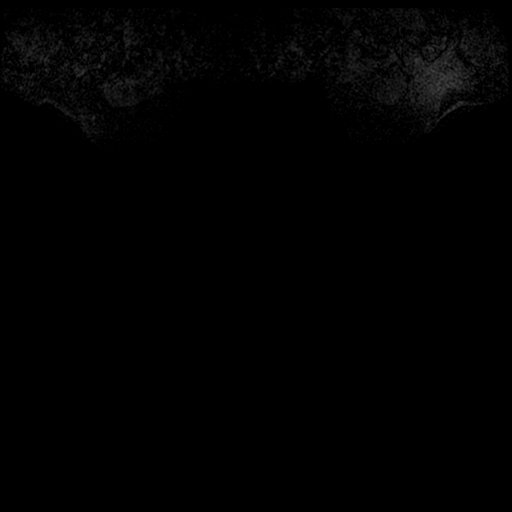

[Series 602: ph2/ vibrant mph · axial · 1.8mm · 0.70mm/px · z∈[-123,+99]mm · 6 of 248 slices shown]
[im 1/248]
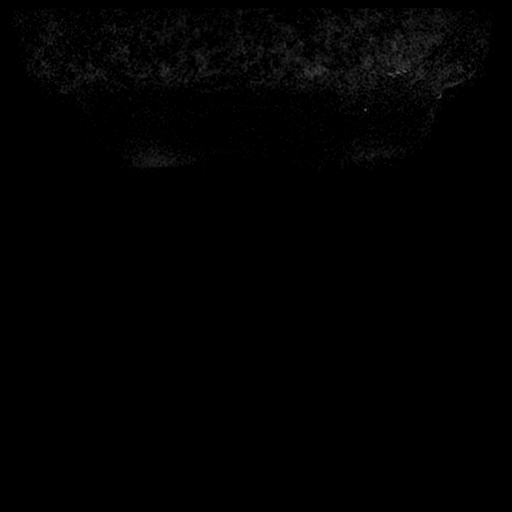
[im 50/248]
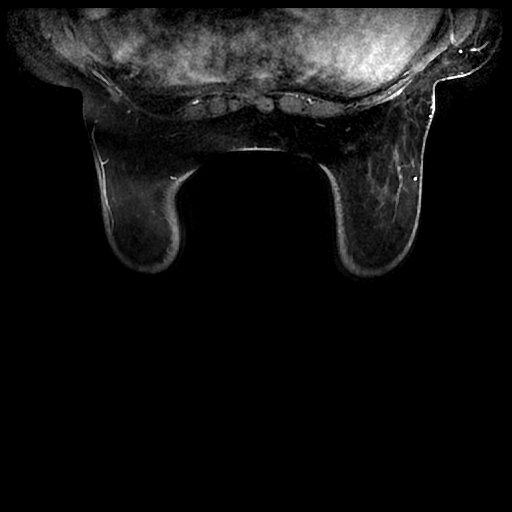
[im 99/248]
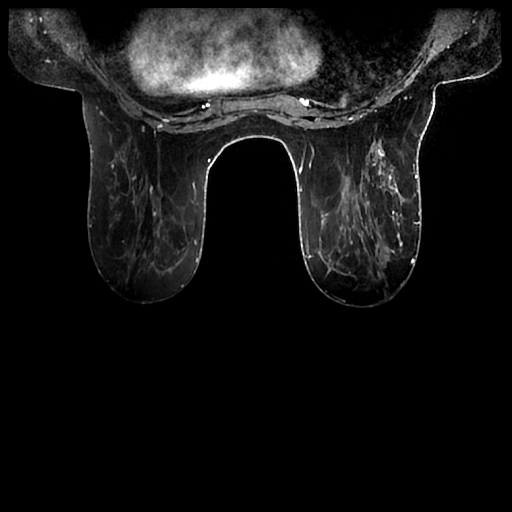
[im 149/248]
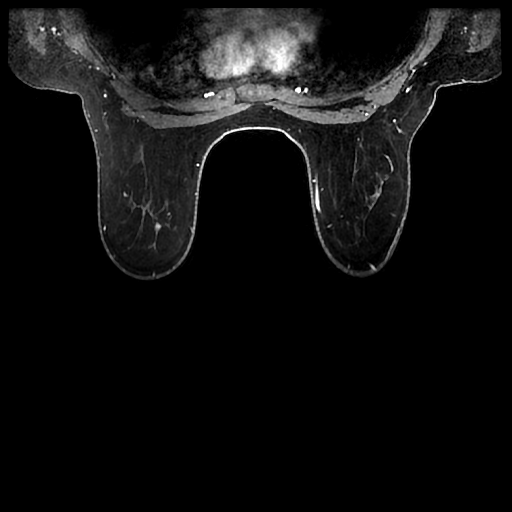
[im 198/248]
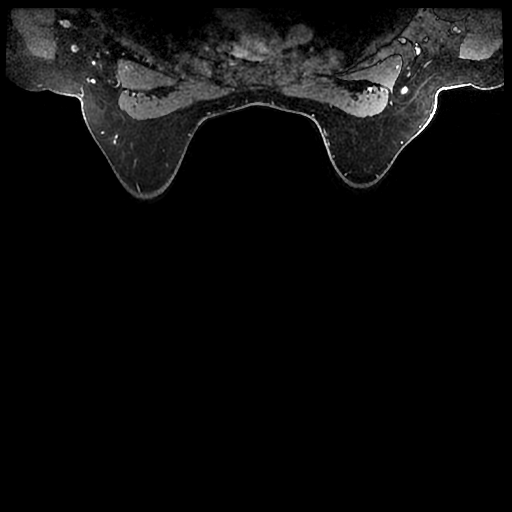
[im 248/248]
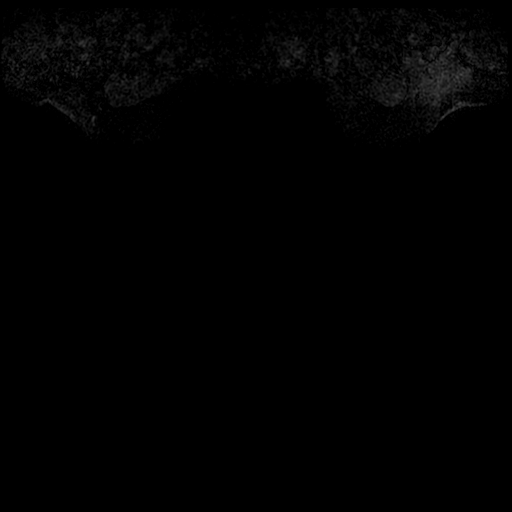

[18 of 48 positions shown; findings below may reference images not displayed]

FINDINGS: Breast composition: b. Scattered fibroglandular tissue.

Background parenchymal enhancement: Mild.

Right breast: 4.4 x 2.8 x 2.1 cm irregular area of mass and non mass
enhancement in the posterior aspect of the upper outer right breast
demonstrating predominantly plateau enhancement kinetics. A biopsy
marker clip artifact is demonstrated in the central portion of the
area of mass enhancement.

3.0 cm more anteriorly in the upper outer quadrant of the right
breast, there is a 0.8 x 0.6 x 0.5 cm similar-appearing and
similarly enhancing mass. Together, these span an area measuring
cm.

Multiple small areas of nodular background parenchymal enhancement
no additional findings suspicious for malignancy.

Left breast: Multiple small areas of nodular background parenchymal
enhancement with no dominant nodules suspicious for malignancy.

Lymph nodes: No abnormal appearing lymph nodes.

Ancillary findings:  None.
IMPRESSION: 1. 4.4 x 2.8 x 2.1 cm area of biopsy-proven malignancy in the
posterior aspect of the upper-outer quadrant of the right breast.
2. 3.0 cm more anteriorly, there is a 0.8 x 0.6 x 0.5 cm
similar-appearing, mildly irregular mass. This is concerning for a
2nd focus of malignancy.
3. No evidence of malignancy on the left and no adenopathy.

RECOMMENDATION:
Targeted ultrasound of the anterior aspect of the upper-outer right
breast 2 terminate if the 0.8 cm mass can be localized for
ultrasound-guided biopsy purposes. If this cannot be seen with
ultrasound, MR guided core needle biopsy of this mass would be
recommended.

BI-RADS CATEGORY  4: Suspicious.

## 2019-12-16 ENCOUNTER — Telehealth: Payer: BC Managed Care – PPO | Admitting: Hematology and Oncology

## 2019-12-17 ENCOUNTER — Other Ambulatory Visit: Payer: Self-pay | Admitting: Hematology and Oncology

## 2019-12-28 ENCOUNTER — Telehealth: Payer: Self-pay

## 2019-12-28 DIAGNOSIS — C50411 Malignant neoplasm of upper-outer quadrant of right female breast: Secondary | ICD-10-CM

## 2019-12-28 DIAGNOSIS — Z170 Estrogen receptor positive status [ER+]: Secondary | ICD-10-CM

## 2019-12-28 NOTE — Telephone Encounter (Signed)
UPBEAT [id number] - UNDERSTANDING and PREDICTING BREAST CANCER EVENTS AFTER TREATMENT   12/28/2019 13:37PM   OUTGOING CALL: Outgoing call to [facility]: we spoke for approximately five minutes after confirming that I was speaking with the correct person. I introduce myself as a clinical research nurse for [facility], seeking to inform her of a new 70-month questionnaire recently added to the UPBEAT study and the option to send questionnaires via email should she desire to do so. Saara verbally consents to completing the new questionnaire and consents to having it sent via email at the email address on file with [facility]. Jennene is thanked for her time and continued participation in the [place] study and is encouraged to contact myself or Foye Spurling for any needs: she verbalizes understanding. Addendum consent and email address are added to the [employer] system for electronic distribution of social determinants questionnaire and clinical research nurse [name] is notified of consent. Cameo will follow to verify questionnaire is completed.  Dionne Bucy. Sharlett Iles, BSN, RN, CIC 12/28/2019 1:46 PM

## 2019-12-31 ENCOUNTER — Telehealth: Payer: Self-pay

## 2019-12-31 DIAGNOSIS — Z170 Estrogen receptor positive status [ER+]: Secondary | ICD-10-CM

## 2019-12-31 DIAGNOSIS — C50411 Malignant neoplasm of upper-outer quadrant of right female breast: Secondary | ICD-10-CM

## 2019-12-31 NOTE — Telephone Encounter (Signed)
UPBEAT [id number] - UNDERSTANDING and PREDICTING BREAST CANCER EVENTS AFTER TREATMENT   12/31/2019 13:00PM   OUTGOING CALL: Outgoing call to [facility]: we spoke for approximately five minutes after confirming that I was speaking with the correct person. Sandra [name] completed the new "Social Determinants of Health" questionnaire via email (following consent - see previous note) but the last question remains unanswered. I ask Sandra [name] the question over the phone and receive her verbal response which is entered into the [employer] system. Sandra [name] reports that since responding to the questionnaire, she is receiving repeated messages to complete the survey but when she tries to open it, it is "locked for editing." When Sandra [name]'s response is entered into the [employer] system, there is no option to "complete" the survey. An email is forwarded to the study Environmental consultant for resolution and [name] is notified of the above. Before ending our discussion, Sandra [name] is thanked for her time and continued participation in the [name] study and is encouraged to contact [name] for any needs: she verbalizes understanding.   Dionne Bucy. Sharlett Iles, BSN, RN, CIC 12/31/2019 1:24 PM

## 2020-01-04 ENCOUNTER — Other Ambulatory Visit: Payer: Self-pay | Admitting: Hematology and Oncology

## 2020-01-18 ENCOUNTER — Ambulatory Visit (INDEPENDENT_AMBULATORY_CARE_PROVIDER_SITE_OTHER): Payer: BC Managed Care – PPO | Admitting: Psychiatry

## 2020-01-18 ENCOUNTER — Other Ambulatory Visit: Payer: Self-pay

## 2020-01-18 ENCOUNTER — Encounter (HOSPITAL_COMMUNITY): Payer: Self-pay | Admitting: Psychiatry

## 2020-01-18 VITALS — Wt 230.0 lb

## 2020-01-18 DIAGNOSIS — F330 Major depressive disorder, recurrent, mild: Secondary | ICD-10-CM | POA: Diagnosis not present

## 2020-01-18 DIAGNOSIS — F419 Anxiety disorder, unspecified: Secondary | ICD-10-CM

## 2020-01-18 MED ORDER — VORTIOXETINE HBR 10 MG PO TABS: ORAL_TABLET | ORAL | 0 refills | Status: DC

## 2020-01-18 NOTE — Progress Notes (Signed)
Virtual Visit via Telephone Note  I connected with Cleora Fleet Sen on 01/18/20 at  9:40 AM EDT by telephone and verified that I am speaking with the correct person using two identifiers.  Location: Patient: work Provider: home office   I discussed the limitations, risks, security and privacy concerns of performing an evaluation and management service by telephone and the availability of in person appointments. I also discussed with the patient that there may be a patient responsible charge related to this service. The patient expressed understanding and agreed to proceed.   History of Present Illness: Patient is evaluated by phone session.  She is very anxious, emotional and tearful.  Patient told she had beach vacation trip with the family and she forgot to take her Cymbalta and had a rough time.Marland Kitchen  She was emotional, tearful, feels zombie and did not enjoy the vacation.  She had a second trip from the work to attend conference and she again did not took her Cymbalta but it was not as bad.  Now she is not taking Cymbalta at all and she felt in the beginning good and she was able to laugh and enjoy the life however lately she feels again very emotional, depressed, either sleeping too much or too little.  She has no motivation to do things.  She gets easily tearful and not motivated to do things.  She is very forgetful and also noticed that her job is very challenging.  She has difficulty completing the task.  She works for [employer].  She is not taking melatonin.  She had stopped Abilify which was recommended as she was complaining of weight gain and dizziness.  However she continues to have dizziness and she gained another 3 pounds since she stopped Abilify and now Cymbalta.  She admitted some time hopelessness but denies any suicidal thoughts, paranoia or any hallucination.  She is working in person.  Her daughter is also seeing a therapist and sometimes she is concerned about her because she  spent too much time on video games.  Patient is in therapy with Cloy[facility] Reams breeze twice a month.  She is open to try different medication.  Past Psychiatric History: [facility] Tried Paxil, Lexapro, ativan and trazadonebut don'tremember. Lamictal did not help and Wellbutrin causeddepression. We tried EffexorandProzac butdid not help. Noh/opsychosis, suicidal attempt or Inpatient.   Psychiatric Specialty Exam: Physical Exam  Review of Systems  Weight 230 lb (104.3 kg).There is no height or weight on file to calculate BMI.  General Appearance: NA  Eye Contact:  NA  Speech:  Slow  Volume:  Decreased  Mood:  Anxious, Depressed and emotional  Affect:  NA  Thought Process:  Descriptions of Associations: Intact  Orientation:  Full (Time, Place, and Person)  Thought Content:  Rumination  Suicidal Thoughts:  No  Homicidal Thoughts:  No  Memory:  Immediate;   Good Recent;   Good Remote;   Good  Judgement:  Fair  Insight:  Shallow  Psychomotor Activity:  NA  Concentration:  Concentration: Fair and Attention Span: Fair  Recall:  [place] of Knowledge:  Good  Language:  Good  Akathisia:  No  Handed:  Right  AIMS (if indicated):     Assets:  Communication Skills Desire for Improvement Resilience Social Support  ADL's:  Intact  Cognition:  WNL  Sleep:         Assessment and Plan: Major depressive disorder, recurrent.  Anxiety.  I had a long  discussion with the patient about abruptly stopping the Cymbalta as it may cause significant withdrawal symptoms.  Though she did not bring her medicine to her family vacation and suffers from withdrawal symptoms but then withdrawal symptoms over she felt better for a little while and now she again slipping into depression and anxiety.  In the past she had tried Lamictal, Wellbutrin, Effexor, Prozac, Paxil, Lexapro, trazodone and recently Cymbalta and Abilify.  We talked about trying Trintellix since she has  never tried before.  She agreed to give a try.  We will start Trintellix 5 mg daily for 1 week and then 10 mg daily.  We will provide samples and also I will call in a 30-day supply to her local pharmacy.  Recommend to call us back if she has any question, concern or if she feels worsening of the symptoms or having any side effects from the medication.  Encouraged to continue therapy with [facility] breeze.  Follow-up in 6 weeks.  Discussed safety concerns and anytime having active suicidal thoughts or homicidal thoughts continue to call 911 or go to local emergency room.  Follow Up Instructions:    I discussed the assessment and treatment plan with the patient. The patient was provided an opportunity to ask questions and all were answered. The patient agreed with the plan and demonstrated an understanding of the instructions.   The patient was advised to call back or seek an in-person evaluation if the symptoms worsen or if the condition fails to improve as anticipated.  I provided 25 minutes of non-face-to-face time during this encounter.   Kathlee Nations, MD

## 2020-01-24 ENCOUNTER — Telehealth (HOSPITAL_COMMUNITY): Payer: Self-pay | Admitting: Psychiatry

## 2020-01-24 ENCOUNTER — Telehealth (HOSPITAL_COMMUNITY): Payer: Self-pay | Admitting: *Deleted

## 2020-01-24 NOTE — Telephone Encounter (Signed)
Thanks for update

## 2020-01-24 NOTE — Telephone Encounter (Signed)
Pt called concerned about not being able to afford Trintellix after speaking with her pharmacy who tells her medication needs a PA. So pt does not want to start with the samples if she will not be able to afford. Pt encouraged to call insurance company and get more information. Pt verbalizes understanding.

## 2020-01-27 ENCOUNTER — Telehealth (HOSPITAL_COMMUNITY): Payer: Self-pay | Admitting: *Deleted

## 2020-01-27 NOTE — Telephone Encounter (Signed)
PA for Trintellix 20mg  tabs approved by [employer] [place] from 01/24/20 thru 01/23/23. Refernce #  [id number].

## 2020-02-03 DIAGNOSIS — F331 Major depressive disorder, recurrent, moderate: Secondary | ICD-10-CM | POA: Diagnosis not present

## 2020-02-14 DIAGNOSIS — F331 Major depressive disorder, recurrent, moderate: Secondary | ICD-10-CM | POA: Diagnosis not present

## 2020-02-14 DIAGNOSIS — F411 Generalized anxiety disorder: Secondary | ICD-10-CM | POA: Diagnosis not present

## 2020-02-14 NOTE — Progress Notes (Signed)
54 y.o. G58P2002 Married Caucasian female here for annual exam.    Intercourse is painful.  Denies vaginal bleeding.   Just starting Trintellix for depression.   May pursue nonmedical therapy.  No flu vaccine yet.  Vaccinated against Covid.  PCP:  None   No LMP recorded. (Menstrual status: Chemotherapy).           Sexually active: No.  The current method of family planning is none--hx chemo.    Exercising: No.  The patient does not participate in regular exercise at present. Smoker:  no  Health Maintenance: Pap: 01-19-18 Neg:Neg HR HPV,  11-18-14 Neg:Neg HR HPV, 10-24-11 Neg:Neg HR HPV History of abnormal Pap:  no MMG: Hx Rt.Br.CA/04-01-19 Diag.Bil/Neg/density B/BiRads2  Colonoscopy: 2018 normal;next 5 years BMD: 04-20-19  Result :Normal TDaP:  12-07-15 Gardasil:   no HIV: Neg in preg Hep C:Unsure Screening Labs:  Will do with oncology.    reports that she has never smoked. She has never used smokeless tobacco. She reports that she does not drink alcohol and does not use drugs.  Past Medical History:  Diagnosis Date  . Breast cancer ([place]) 11/2017   rt.Br.  . Depression   . Endometriosis, diagnosis via laparoscopy   . Family history of breast cancer   . Family history of breast cancer   . Family history of colon cancer   . Family history of prostate cancer   . Hyperlipidemia 2017  . Lichen sclerosus, vulva   . Low serum vitamin D 2017  . Personal history of radiation therapy     Past Surgical History:  Procedure Laterality Date  . APPENDECTOMY  2005  . BREAST LUMPECTOMY    . BREAST LUMPECTOMY WITH RADIOACTIVE SEED AND SENTINEL LYMPH NODE BIOPSY Right 04/23/2018   Procedure: RIGHT BREAST WITH RADIOACTIVE SEED LUMPECTOMY x3 AND SENTINEL LYMPH NODE MAPPING;  Surgeon: Erroll Luna, MD;  Location: [place];  Service: General;  Laterality: Right;  . CESAREAN SECTION  2002  . MANDIBLE RECONSTRUCTION  1985   wires inside jaw according to pt  . PELVIC LAPAROSCOPY  2004  .  PORT-A-CATH REMOVAL Right 01/27/2019   Procedure: PORT REMOVAL;  Surgeon: Erroll Luna, MD;  Location: [name];  Service: General;  Laterality: Right;  . PORTACATH PLACEMENT Right 12/03/2017   Procedure: INSERTION PORT-A-CATH;  Surgeon: Erroll Luna, MD;  Location: [facility];  Service: General;  Laterality: Right;  . [facility] SURGERY  1998    Current Outpatient Medications  Medication Sig Dispense Refill  . betamethasone valerate ointment (VALISONE) 0.1 % Apply bid for 1-2 weeks as needed for flare of lichen sclerosis. 15 g 1  . letrozole (FEMARA) 2.5 MG tablet TAKE 0.5 TABLETS (1.25 MG TOTAL) BY MOUTH DAILY. 90 tablet 1  . vortioxetine HBr (TRINTELLIX) 10 MG TABS tablet Take one tab daily 30 tablet 0   No current facility-administered medications for this visit.    Family History  Problem Relation Age of Onset  . Stroke Mother   . Heart disease Mother   . Colon cancer Father   . Prostate cancer Father   . Hypertension Brother   . Cancer Maternal Uncle        NOS  . Breast cancer Paternal Aunt        dx in her 68s  . Prostate cancer Paternal Uncle   . Alcohol abuse Maternal Grandfather   . Breast cancer Paternal Grandmother        dx in her 10s  .  Prostate cancer Paternal Grandfather   . SIDS Brother   . Cancer Paternal Aunt   . Tuberculosis Paternal Uncle     Review of Systems  All other systems reviewed and are negative.   Exam:   BP 130/72 (Cuff Size: Large)   Pulse 74   Ht 5\' 9"  (1.753 m)   Wt 225 lb (102.1 kg)   SpO2 99%   BMI 33.23 kg/m     General appearance: alert, cooperative and appears stated age Head: normocephalic, without obvious abnormality, atraumatic Neck: no adenopathy, supple, symmetrical, trachea midline and thyroid normal to inspection and palpation Lungs: clear to auscultation bilaterally Breasts: left - normal appearance, no masses or tenderness, No nipple retraction or dimpling, No nipple discharge or  bleeding, No axillary adenopathy Right - radiation changes changes and scar noted.  No masses or tenderness.  No nipple discharge or bleeding.  No axillary adenopathy.  Heart: regular rate and rhythm Abdomen: soft, non-tender; no masses, no organomegaly Extremities: extremities normal, atraumatic, no cyanosis or edema Skin: skin color, texture, turgor normal. No rashes or lesions Lymph nodes: cervical, supraclavicular, and axillary nodes normal. Neurologic: grossly normal  Pelvic: External genitalia:  Hypopigmentation of vulva and perianal region.               No abnormal inguinal nodes palpated.              Urethra:  normal appearing urethra with no masses, tenderness or lesions              Bartholins and Skenes: normal                 Vagina: normal appearing vagina with normal color and discharge, no lesions              Cervix: no lesions              Pap taken: No. Bimanual Exam:  Uterus:  normal size, contour, position, consistency, mobility, non-tender              Adnexa: no mass, fullness, tenderness              Rectal exam: Yes.  .  Confirms.              Anus:  normal sphincter tone, no lesions  Chaperone was present for exam.  Assessment:   Well woman visit with normal exam. Right breast cancer.  Negative genetic testing.  Status post lumpectomy, XRT, and chemotherapy.  On Femara.  Menopausal female. Atrophy.  Lichen sclerosus.  Controlled. FH colon cancer.  Hx low vit D. Hx elevated LDL cholesterol. FH CVD. Depression.    Plan: Mammogram screening discussed. Self breast awareness reviewed. Pap and HR HPV as above. Guidelines for Calcium, Vitamin D, regular exercise program including cardiovascular and weight bearing exercise. Cooking oils and vit E discussed for vaginal hydration.  Refill of Valisone ointment.  Labs with oncology.  We discussed Covid booster. Follow up annually and prn.

## 2020-02-15 ENCOUNTER — Encounter: Payer: Self-pay | Admitting: Obstetrics and Gynecology

## 2020-02-15 ENCOUNTER — Ambulatory Visit: Payer: BC Managed Care – PPO | Admitting: Obstetrics and Gynecology

## 2020-02-15 ENCOUNTER — Other Ambulatory Visit: Payer: Self-pay

## 2020-02-15 VITALS — BP 130/72 | HR 74 | Ht 69.0 in | Wt 225.0 lb

## 2020-02-15 DIAGNOSIS — Z01419 Encounter for gynecological examination (general) (routine) without abnormal findings: Secondary | ICD-10-CM | POA: Diagnosis not present

## 2020-02-15 MED ORDER — BETAMETHASONE VALERATE 0.1 % EX OINT
TOPICAL_OINTMENT | CUTANEOUS | 1 refills | Status: DC
Start: 2020-02-15 — End: 2021-01-16

## 2020-02-15 NOTE — Patient Instructions (Signed)

## 2020-02-23 ENCOUNTER — Other Ambulatory Visit (HOSPITAL_COMMUNITY): Payer: Self-pay | Admitting: Psychiatry

## 2020-02-23 DIAGNOSIS — F330 Major depressive disorder, recurrent, mild: Secondary | ICD-10-CM

## 2020-02-23 DIAGNOSIS — F419 Anxiety disorder, unspecified: Secondary | ICD-10-CM

## 2020-02-28 ENCOUNTER — Telehealth (HOSPITAL_COMMUNITY): Payer: Self-pay | Admitting: Psychiatry

## 2020-02-28 ENCOUNTER — Telehealth (INDEPENDENT_AMBULATORY_CARE_PROVIDER_SITE_OTHER): Payer: BC Managed Care – PPO | Admitting: Psychiatry

## 2020-02-28 ENCOUNTER — Encounter (HOSPITAL_COMMUNITY): Payer: Self-pay | Admitting: Psychiatry

## 2020-02-28 ENCOUNTER — Other Ambulatory Visit: Payer: Self-pay

## 2020-02-28 DIAGNOSIS — F330 Major depressive disorder, recurrent, mild: Secondary | ICD-10-CM

## 2020-02-28 DIAGNOSIS — F411 Generalized anxiety disorder: Secondary | ICD-10-CM | POA: Diagnosis not present

## 2020-02-28 DIAGNOSIS — F419 Anxiety disorder, unspecified: Secondary | ICD-10-CM

## 2020-02-28 DIAGNOSIS — F332 Major depressive disorder, recurrent severe without psychotic features: Secondary | ICD-10-CM | POA: Diagnosis not present

## 2020-02-28 MED ORDER — VORTIOXETINE HBR 20 MG PO TABS: ORAL_TABLET | ORAL | 1 refills | Status: DC

## 2020-02-28 NOTE — Telephone Encounter (Signed)
D:  Dr. Adele Schilder referred pt to [place].  A:  Placed call to orient pt and provide her with a start date, but there was no answer.  Left her a vm to call case manager back.  Inform Dr. Adele Schilder.

## 2020-02-28 NOTE — Progress Notes (Signed)
Virtual Visit via Telephone Note  I connected with Sandra [name] on 02/28/20 at  9:40 AM EST by telephone and verified that I am speaking with the correct person using two identifiers.  Location: Patient: Work Provider: Biomedical scientist   I discussed the limitations, risks, security and privacy concerns of performing an evaluation and management service by telephone and the availability of in person appointments. I also discussed with the patient that there may be a patient responsible charge related to this service. The patient expressed understanding and agreed to proceed.   History of Present Illness: Patient is evaluated by phone session.  Patient presently at work.  We have tried Trintellix and she is taking 10 mg but she has not seen any improvement.  She continues to struggle with anxiety, depression, crying spells.  She has no motivation to do things.  She feels struggling at work at some time she is behind her work.  She remains very emotional and she appeared very depressed but denies any suicidal thoughts.  She has no more dizziness.  She is in therapy with Nicholes Stairs regularly.  She is working for [employer].  She feels a job is very overwhelming and challenging.  We have tried recently Abilify, Cymbalta and now Trintellix.  She has not seen any improvement with Trintellix.  She is taking melatonin for sleep.  She reported her appetite is low and she only eats 1 meal a day.  However she did not have checked her weight and not sure if she lost weight.  She is talking to her therapist to consider nonpharmaceutical approach.  She is interested to explore more options for her treatment.  Past Psychiatric History: [facility] Tried Paxil, Lexapro, ativan and trazadonebut don'tremember. Lamictal did not help and Wellbutrin causeddepression. We tried EffexorandProzac butdid not help. Noh/opsychosis, suicidal attempt or Inpatient.   Psychiatric  Specialty Exam: Physical Exam  Review of Systems  There were no vitals taken for this visit.There is no height or weight on file to calculate BMI.  General Appearance: NA  Eye Contact:  NA  Speech:  Slow  Volume:  Decreased  Mood:  Anxious, Depressed and Emotional  Affect:  NA  Thought Process:  Descriptions of Associations: Intact  Orientation:  Full (Time, Place, and Person)  Thought Content:  Rumination  Suicidal Thoughts:  No  Homicidal Thoughts:  No  Memory:  Immediate;   Good Recent;   Good Remote;   Good  Judgement:  Intact  Insight:  Present  Psychomotor Activity:  NA  Concentration:  Concentration: Fair and Attention Span: Fair  Recall:  Good  Fund of Knowledge:  Good  Language:  Good  Akathisia:  No  Handed:  Right  AIMS (if indicated):     Assets:  Communication Skills Desire for Improvement Housing Resilience Transportation  ADL's:  Intact  Cognition:  WNL  Sleep:   Okay with melatonin      Assessment and Plan: Major depressive disorder, recurrent.  Anxiety.  I discuss with the patient about her medication.  In recent months we have tried lamotrigine, Wellbutrin, Effexor, Abilify, Cymbalta and now she is taking Trintellix.  I discussed that we can consider [place], IOP and Janyth Contes testing.  Patient had a gene site testing in the past that moved center in [place] but we never received any records.  We will try 1 more time to get the [facility] the [facility].  I will refer her [place] evaluation  and IOP.  However in the meantime I recommend to try Trintellix 20 mg daily since patient has no side effects so far.  We will contact Dellia Nims, IOP coordinator.  Patient is willing to try [place] and IOP.  I discussed safety concerns and anytime having active suicidal thoughts or homicidal [place] need to call 911 or go to local emergency room.  Follow-up in 4 to 6 weeks, after IOP and TMS treatment.    Follow Up Instructions:    I discussed the assessment and  treatment plan with the patient. The patient was provided an opportunity to ask questions and all were answered. The patient agreed with the plan and demonstrated an understanding of the instructions.   The patient was advised to call back or seek an in-person evaluation if the symptoms worsen or if the condition fails to improve as anticipated.  I provided 19 minutes of non-face-to-face time during this encounter.   Kathlee Nations, MD

## 2020-02-29 ENCOUNTER — Other Ambulatory Visit (HOSPITAL_COMMUNITY): Payer: Self-pay | Admitting: Psychiatry

## 2020-02-29 DIAGNOSIS — F330 Major depressive disorder, recurrent, mild: Secondary | ICD-10-CM

## 2020-03-01 ENCOUNTER — Telehealth (HOSPITAL_COMMUNITY): Payer: Self-pay | Admitting: Psychiatry

## 2020-03-09 ENCOUNTER — Telehealth (HOSPITAL_COMMUNITY): Payer: Self-pay

## 2020-03-13 ENCOUNTER — Other Ambulatory Visit: Payer: Self-pay | Admitting: Adult Health

## 2020-03-13 DIAGNOSIS — Z9889 Other specified postprocedural states: Secondary | ICD-10-CM

## 2020-03-13 DIAGNOSIS — F322 Major depressive disorder, single episode, severe without psychotic features: Secondary | ICD-10-CM | POA: Diagnosis not present

## 2020-03-13 DIAGNOSIS — F411 Generalized anxiety disorder: Secondary | ICD-10-CM | POA: Diagnosis not present

## 2020-03-15 NOTE — Telephone Encounter (Signed)
Opened in error

## 2020-03-17 NOTE — Telephone Encounter (Signed)
This Probation officer has tried to contact patient multiple times in regards to a [place] referral. Numerous messages have been left on the voicemail. Patient has not returned any calls.

## 2020-04-10 ENCOUNTER — Other Ambulatory Visit: Payer: Self-pay

## 2020-04-10 ENCOUNTER — Telehealth (INDEPENDENT_AMBULATORY_CARE_PROVIDER_SITE_OTHER): Payer: BC Managed Care – PPO | Admitting: Psychiatry

## 2020-04-10 ENCOUNTER — Encounter (HOSPITAL_COMMUNITY): Payer: Self-pay | Admitting: Psychiatry

## 2020-04-10 DIAGNOSIS — F330 Major depressive disorder, recurrent, mild: Secondary | ICD-10-CM | POA: Diagnosis not present

## 2020-04-10 DIAGNOSIS — F419 Anxiety disorder, unspecified: Secondary | ICD-10-CM

## 2020-04-10 MED ORDER — VORTIOXETINE HBR 20 MG PO TABS: ORAL_TABLET | ORAL | 1 refills | Status: DC

## 2020-04-10 NOTE — Progress Notes (Addendum)
Virtual Visit via Telephone Note  I connected with Sandra [name] on 04/10/20 at 10:20 AM EST by telephone and verified that I am speaking with the correct person using two identifiers.  Location: Patient: In Car Provider: Home Office   I discussed the limitations, risks, security and privacy concerns of performing an evaluation and management service by telephone and the availability of in person appointments. I also discussed with the patient that there may be a patient responsible charge related to this service. The patient expressed understanding and agreed to proceed.   History of Present Illness: Patient is evaluated by phone session.  She is on the phone by herself.  The last visit we recommended IOP and [place] however patient interested in person IOP.  She also tried to contact Clinical cytogeneticist and she apologized missing her call and not able to get in touch.  She still interested for [place] evaluation.  She feels her depression is somewhat better.  We have increased Trintellix 20 mg on the last visit.  She also had trip to [place] to visit her father and brother.  Patient feel since Christmas is over and things are somewhat better.  She does not have as much crying spells.  She denies any suicidal thoughts but is still sometimes feel very emotional.  She like to consider [place] again.  Her appetite is same and her weight is unchanged from the past.  She is in therapy with Nicholes Stairs regularly.  She is working in person and job is going okay.  She is working for [employer].  So far she is tolerating Trintellix 20 mg and reported no tremors, shakes or any EPS.  Today she feels a little bit sore throat and wondering if she needs to be tested for COVID.  She denies any breathing issues, fever or chills.  Past Psychiatric History: [facility] Tried Paxil, Lexapro, ativan and trazadonebut don'tremember. Lamictal did not help and Wellbutrin causeddepression. We  tried EffexorandProzac butdid not help. Noh/opsychosis, suicidal attempt or Inpatient.   Psychiatric Specialty Exam: Physical Exam  Review of Systems  Weight 225 lb (102.1 kg).There is no height or weight on file to calculate BMI.  General Appearance: NA  Eye Contact:  NA  Speech:  Clear and Coherent  Volume:  Normal  Mood:  Dysphoric  Affect:  NA  Thought Process:  Goal Directed  Orientation:  Full (Time, Place, and Person)  Thought Content:  Rumination  Suicidal Thoughts:  No  Homicidal Thoughts:  No  Memory:  Immediate;   Good Recent;   Good Remote;   Good  Judgement:  Intact  Insight:  Present  Psychomotor Activity:  NA  Concentration:  Concentration: Fair and Attention Span: Fair  Recall:  Good  Fund of Knowledge:  Good  Language:  Good  Akathisia:  No  Handed:  Right  AIMS (if indicated):     Assets:  Communication Skills Desire for Improvement Housing Resilience Social Support  ADL's:  Intact  Cognition:  WNL  Sleep:   better     Assessment and Plan: Major depressive disorder, recurrent.  Anxiety.  Patient is not interested in remote IOP for now but like to think again for [place] evaluation.  We will send a message to [place] coordinator to contact her.  Patient told they have been playing phone tag and now she like to establish contact.  She feels increased Trintellix and since Christmas is over she is doing better.  Continue Trintellix  20 mg daily, continue therapy with Nicholes Stairs regularly.  Recommended to call us back if there is any question or any concern.  Follow-up in 2 months.    Follow Up Instructions:    I discussed the assessment and treatment plan with the patient. The patient was provided an opportunity to ask questions and all were answered. The patient agreed with the plan and demonstrated an understanding of the instructions.   The patient was advised to call back or seek an in-person evaluation if the symptoms worsen or if the condition  fails to improve as anticipated.  I provided 15 minutes of non-face-to-face time during this encounter.   Kathlee Nations, MD

## 2020-04-11 DIAGNOSIS — Z1152 Encounter for screening for COVID-19: Secondary | ICD-10-CM | POA: Diagnosis not present

## 2020-04-21 ENCOUNTER — Other Ambulatory Visit: Payer: Self-pay

## 2020-04-21 ENCOUNTER — Ambulatory Visit
Admission: RE | Admit: 2020-04-21 | Discharge: 2020-04-21 | Disposition: A | Discharge status: Home / Self Care | Payer: BC Managed Care – PPO | Source: Ambulatory Visit | Attending: Adult Health | Admitting: Adult Health

## 2020-04-21 DIAGNOSIS — Z9889 Other specified postprocedural states: Secondary | ICD-10-CM

## 2020-04-21 DIAGNOSIS — R922 Inconclusive mammogram: Secondary | ICD-10-CM | POA: Diagnosis not present

## 2020-04-21 HISTORY — DX: Personal history of antineoplastic chemotherapy: Z92.21

## 2020-05-24 DIAGNOSIS — C50911 Malignant neoplasm of unspecified site of right female breast: Secondary | ICD-10-CM | POA: Diagnosis not present

## 2020-06-02 ENCOUNTER — Telehealth (HOSPITAL_COMMUNITY): Payer: BC Managed Care – PPO | Admitting: Psychiatry

## 2020-06-06 ENCOUNTER — Telehealth (INDEPENDENT_AMBULATORY_CARE_PROVIDER_SITE_OTHER): Payer: BC Managed Care – PPO | Admitting: Psychiatry

## 2020-06-06 ENCOUNTER — Other Ambulatory Visit: Payer: Self-pay

## 2020-06-06 ENCOUNTER — Encounter (HOSPITAL_COMMUNITY): Payer: Self-pay | Admitting: Psychiatry

## 2020-06-06 DIAGNOSIS — F330 Major depressive disorder, recurrent, mild: Secondary | ICD-10-CM

## 2020-06-06 DIAGNOSIS — F419 Anxiety disorder, unspecified: Secondary | ICD-10-CM

## 2020-06-06 MED ORDER — VORTIOXETINE HBR 20 MG PO TABS: ORAL_TABLET | ORAL | 1 refills | Status: DC

## 2020-06-06 NOTE — Progress Notes (Signed)
Virtual Visit via Telephone Note  I connected with Sandra [name] on 06/06/20 at  9:40 AM EST by telephone and verified that I am speaking with the correct person using two identifiers.  Location: Patient: Work Provider: Biomedical scientist   I discussed the limitations, risks, security and privacy concerns of performing an evaluation and management service by telephone and the availability of in person appointments. I also discussed with the patient that there may be a patient responsible charge related to this service. The patient expressed understanding and agreed to proceed.   History of Present Illness: Patient is evaluated by phone session.  She reported since February she has been doing well.  There is less stress at work and she has not has depressed anxious and not having any crying spells or any feeling of hopelessness.  She is sleeping better and not taking melatonin.  Her therapist Cloyde Reams left the practice and now she is waiting for a new therapist.  She like to consider [facility] and we had referred her for evaluation but no one had contact.  She had a good response with [facility].  She is afraid that she may relapse into depression again and she like to consider TMS evaluation so if needed then she should be ready.  She is tolerating her medication and reported no side effects.  She is working for [employer] and things are going well.  Her appetite is okay.  Her weight is unchanged from the past.  She has no tremors shakes or any EPS.  She like to keep the Trintellix since she felt it helped.   Past Psychiatric History: [facility]. Tried Paxil, Lexapro, ativan and trazadonebut don'tremember. Lamictal did not help and Wellbutrin causeddepression. We tried EffexorandProzac butdid not help. Noh/opsychosis, suicidal attempt or Inpatient.  Psychiatric Specialty Exam: Physical Exam  Review of Systems  Weight 227 lb (103 kg).There is no height or weight on  file to calculate BMI.  General Appearance: NA  Eye Contact:  NA  Speech:  Normal Rate  Volume:  Normal  Mood:  Euthymic  Affect:  NA  Thought Process:  Goal Directed  Orientation:  Full (Time, Place, and Person)  Thought Content:  WDL  Suicidal Thoughts:  No  Homicidal Thoughts:  No  Memory:  Immediate;   Good Recent;   Good Remote;   Good  Judgement:  Intact  Insight:  Present  Psychomotor Activity:  NA  Concentration:  Concentration: Good and Attention Span: Good  Recall:  Good  Fund of Knowledge:  Good  Language:  Good  Akathisia:  No  Handed:  Right  AIMS (if indicated):     Assets:  Communication Skills Desire for Improvement Housing Resilience Social Support Transportation  ADL's:  Intact  Cognition:  WNL  Sleep:   better      Assessment and Plan: Major depressive disorder, recurrent.  Anxiety.  Patient doing better with the Trintellix but like to have [place] evaluation so in case she needed for the future.  She is afraid her symptoms may come back but currently she is feeling good with medication.  Her therapist left and she like to get appointment and information about therapist in our office.  She like to keep the appointment in 2 months.  We will email [facility] coordinator for evaluation.  Continue Trintellix 20 mg daily.  We will also provide information about therapist in our office if she consider.  Discussed medication side effects and benefits.  Recommended  to call us back if she is any question or any concern.  Follow-up in 2 months.  Follow Up Instructions:    I discussed the assessment and treatment plan with the patient. The patient was provided an opportunity to ask questions and all were answered. The patient agreed with the plan and demonstrated an understanding of the instructions.   The patient was advised to call back or seek an in-person evaluation if the symptoms worsen or if the condition fails to improve as anticipated.  I provided 19 minutes of  non-face-to-face time during this encounter.   Kathlee Nations, MD

## 2020-06-07 ENCOUNTER — Telehealth (HOSPITAL_COMMUNITY): Payer: BC Managed Care – PPO | Admitting: Psychiatry

## 2020-06-29 ENCOUNTER — Encounter (HOSPITAL_COMMUNITY): Payer: Self-pay | Admitting: Psychiatry

## 2020-06-29 ENCOUNTER — Other Ambulatory Visit: Payer: Self-pay

## 2020-06-29 ENCOUNTER — Ambulatory Visit (INDEPENDENT_AMBULATORY_CARE_PROVIDER_SITE_OTHER): Payer: BC Managed Care – PPO | Admitting: Psychiatry

## 2020-06-29 VITALS — BP 140/82 | HR 78 | Temp 98.9°F | Wt 224.0 lb

## 2020-06-29 DIAGNOSIS — F332 Major depressive disorder, recurrent severe without psychotic features: Secondary | ICD-10-CM

## 2020-06-29 DIAGNOSIS — F411 Generalized anxiety disorder: Secondary | ICD-10-CM | POA: Diagnosis not present

## 2020-06-29 NOTE — Progress Notes (Signed)
BH MD/PA/NP OP Progress Note  06/29/2020 2:24 PM [place]  MRN:  [id number]  Chief Complaint:  Chief Complaint    TMS Consult     HPI: Patient is a 55 year old who presents for psychiatric evaluation in regards to [place].  Patient reports that she has been seeing Dr. Adele Schilder since 2019, prior to that [facility] and has not seen any benefit with medications in regards to her depression.  Patient reports that she has been depressed for about 10 years now, states that she has tried various psychotropic medications, adds that they have not help with her depression.  She also reports she had genetic testing done at the [facility] treatment center, to help see if she would respond to any medications but has had no benefit.  Patient reports that she feels tired a lot, has difficulty with day-to-day activities, feels slowed down, feels hopeless and helpless, struggles with her sleep, has problems with completing tasks, does not seem to enjoy anything and wants to feel better.  Patient states that she started therapy in college, was seeing a therapist on and off and over the past 10 years, has seen a psychiatrist and a therapist regularly.  On being questioned if any of the medications help, patient stated no.  Patient reports that she is currently on Trintellix and in the past has tried Abilify, Klonopin, Cymbalta, fluoxetine, Lamictal, Paxil and Effexor XR.  She states on a scale of 0-10 with 0 being no symptoms and 10 being the worst, her depression stays mostly an 8 out of 10.  She however denies having had suicidal thoughts, any psychiatric admissions, any thoughts of ending her life.  Patient states that she is employed, is married, has 2 kids aged 100 and 18.  She has that she wants to feel better so wants to try [place] as she has not been successful on medications.  Patient currently denies any aggravating or relieving factors.  She also reports that she is in remission in  regards to her breast cancer.  She denies any other medical history Visit Diagnosis:    ICD-10-CM   1. Severe episode of recurrent major depressive disorder, without psychotic features ([place])  F33.2   2. GAD (generalized anxiety disorder)  F41.1     Past Psychiatric History: has been seeing a therapist on and off since college, regularly for past 10 years. Was seen at [facility] till 2019 for medications, since then follows up with DR Arfeen  Currently on Trintellix. Has tried Abilify, Prozac, Lamictal, Paxil, Cymbalta and Klonopin  Past Medical History:  Past Medical History:  Diagnosis Date  . Breast cancer ([place]) 11/2017   rt.Br.  . Depression   . Endometriosis, diagnosis via laparoscopy   . Family history of breast cancer   . Family history of breast cancer   . Family history of colon cancer   . Family history of prostate cancer   . Hyperlipidemia 2017  . Lichen sclerosus, vulva   . Low serum vitamin D 2017  . Personal history of chemotherapy   . Personal history of radiation therapy     Past Surgical History:  Procedure Laterality Date  . APPENDECTOMY  2005  . BREAST BIOPSY Right 11/11/2017  . BREAST BIOPSY Right 12/12/2017   x2  . BREAST LUMPECTOMY Right 04/23/2018  . BREAST LUMPECTOMY WITH RADIOACTIVE SEED AND SENTINEL LYMPH NODE BIOPSY Right 04/23/2018   Procedure: RIGHT BREAST WITH RADIOACTIVE SEED LUMPECTOMY x3 AND  SENTINEL LYMPH NODE MAPPING;  Surgeon: Erroll Luna, MD;  Location: [facility];  Service: General;  Laterality: Right;  . CESAREAN SECTION  2002  . MANDIBLE RECONSTRUCTION  1985   wires inside jaw according to pt  . PELVIC LAPAROSCOPY  2004  . PORT-A-CATH REMOVAL Right 01/27/2019   Procedure: PORT REMOVAL;  Surgeon: Erroll Luna, MD;  Location: [facility];  Service: General;  Laterality: Right;  . PORTACATH PLACEMENT Right 12/03/2017   Procedure: INSERTION PORT-A-CATH;  Surgeon: Erroll Luna, MD;  Location: [facility];  Service: General;  Laterality: Right;  . SHIN SURGERY  1998    Family Psychiatric History: brother, depression Family History:  Family History  Problem Relation Age of Onset  . Stroke Mother   . Heart disease Mother   . Colon cancer Father   . Prostate cancer Father   . Hypertension Brother   . Cancer Maternal Uncle        NOS  . Breast cancer Paternal Aunt        dx in her 72s  . Prostate cancer Paternal Uncle   . Alcohol abuse Maternal Grandfather   . Breast cancer Paternal Grandmother        dx in her 18s  . Prostate cancer Paternal Grandfather   . SIDS Brother   . Cancer Paternal Aunt   . Tuberculosis Paternal Uncle     Social History:  Social History   Socioeconomic History  . Marital status: Married    Spouse name: [name]  . Number of children: 2  . Years of education: Not on file  . Highest education level: Bachelor's degree (e.g., BA, AB, BS)  Occupational History  . Not on file  Tobacco Use  . Smoking status: Never Smoker  . Smokeless tobacco: Never Used  Vaping Use  . Vaping Use: Never used  Substance and Sexual Activity  . Alcohol use: No    Alcohol/week: 0.0 standard drinks  . Drug use: No  . Sexual activity: Not Currently    Partners: Male    Comment: Mirena inserted 05/16/16, IUD is to be removed08/29/19  Other Topics Concern  . Not on file  Social History Narrative  . Not on file   Social Determinants of Health   Financial Resource Strain: Not on file  Food Insecurity: Not on file  Transportation Needs: Not on file  Physical Activity: Not on file  Stress: Not on file  Social Connections: Not on file    Allergies: No Known Allergies  Metabolic Disorder Labs: Lab Results  Component Value Date   HGBA1C 5.4 04/21/2017   No results found for: PROLACTIN Lab Results  Component Value Date   CHOL 241 (H) 12/03/2018   TRIG 87 12/03/2018   HDL 59 12/03/2018   CHOLHDL 4.1 12/03/2018   VLDL 17 12/03/2018   LDLCALC 165 (H)  12/03/2018   LDLCALC 163 (H) 04/21/2017   Lab Results  Component Value Date   TSH 1.43 04/21/2017   TSH 1.113 11/11/2014    Therapeutic Level Labs: No results found for: LITHIUM No results found for: VALPROATE No components found for:  CBMZ  Current Medications: Current Outpatient Medications  Medication Sig Dispense Refill  . betamethasone valerate ointment (VALISONE) 0.1 % Apply bid for 1-2 weeks as needed for flare of lichen sclerosis. 15 g 1  . letrozole (FEMARA) 2.5 MG tablet TAKE 0.5 TABLETS (1.25 MG TOTAL) BY MOUTH DAILY. 90 tablet 1  . Melatonin 10 MG TABS Take  10 mg by mouth.    . vortioxetine HBr (TRINTELLIX) 20 MG TABS tablet Take one tab daily 30 tablet 1   No current facility-administered medications for this visit.      Musculoskeletal: Strength & Muscle Tone: within normal limits Gait & Station: normal Patient leans: N/A  Psychiatric Specialty Exam: Review of Systems  Constitutional: Positive for activity change, appetite change and fatigue. Negative for chills and diaphoresis.  HENT: Negative.  Negative for congestion, sore throat and voice change.   Eyes: Negative.  Negative for redness and visual disturbance.  Respiratory: Negative.  Negative for cough and wheezing.   Cardiovascular: Negative.  Negative for palpitations.  Allergic/Immunologic: Positive for immunocompromised state.  Neurological: Negative.  Negative for tremors, seizures, syncope, light-headedness and headaches.  Psychiatric/Behavioral: Positive for dysphoric mood and sleep disturbance. Negative for confusion, hallucinations, self-injury and suicidal ideas. The patient is nervous/anxious. The patient is not hyperactive.     There were no vitals taken for this visit.There is no height or weight on file to calculate BMI.  General Appearance: Casual  Eye Contact:  Fair  Speech:  Clear and Coherent and Normal Rate  Volume:  Normal  Mood:  Depressed, Dysphoric, Hopeless and Worthless   Affect:  Congruent, Depressed and Restricted  Thought Process:  Coherent, Goal Directed and Descriptions of Associations: Intact  Orientation:  Full (Time, Place, and Person)  Thought Content: Logical and Rumination   Suicidal Thoughts:  No  Homicidal Thoughts:  No  Memory:  Immediate;   Fair Recent;   Fair Remote;   Fair  Judgement:  Impaired  Insight:  Shallow  Psychomotor Activity:  Mannerisms  Concentration:  Concentration: Fair and Attention Span: Fair  Recall:  [employer] of Knowledge: Fair  Language: Fair  Akathisia:  No  Handed:  Right  AIMS (if indicated): not done  Assets:  Communication Skills Desire for Improvement Financial Resources/Insurance Housing Intimacy Social Support Talents/Skills Transportation  ADL's:  Intact  Cognition: WNL  Sleep:  Poor   Screenings: PHQ2-9   [facility] Office Visit from 06/29/2020 in [facility] Video Visit from 06/06/2020 in [facility] Office Visit from 04/21/2017 in [facility]  PHQ-2 Total Score 6 1 0  PHQ-9 Total Score 22 -- --       Assessment and Plan: Patient does have major depressive disorder, her PHQ 9 score is 22.  Patient has not responded to medications over the past 10 years, has tried various psychotropic medications and is a candidate for [place].  50% of this visit was spent in discussing treatment options, various medications, along with discussing ECT.  Also discussed coping strategies, nutrition, exercise as ways to help with patient's depression along with medication.  Discussed TMS, its benefits, the whole process for treatment  This visit was a 40-minute visit.  Hampton Abbot, MD 06/29/2020, 2:24 PM

## 2020-07-27 ENCOUNTER — Ambulatory Visit (INDEPENDENT_AMBULATORY_CARE_PROVIDER_SITE_OTHER): Payer: BC Managed Care – PPO | Admitting: Psychiatry

## 2020-07-27 ENCOUNTER — Other Ambulatory Visit: Payer: Self-pay

## 2020-07-27 DIAGNOSIS — F332 Major depressive disorder, recurrent severe without psychotic features: Secondary | ICD-10-CM | POA: Diagnosis not present

## 2020-07-27 NOTE — Progress Notes (Signed)
Pt reported to [name] for cortical mapping and motor threshold determination for Repetitive Transcranial Magnetic Stimulation treatment for Major Depressive Disorder. Prior to procedure, pt signed an informed consent agreement for [place] treatment. Pt's treatment area was found by applying single pulses to her left motor cortex, hunting along the anterior/posterior plane and along the superior oblique angle until the best motor response was elicited from the pt's right thumb. The best response was observed at 5cm A/P and 30 degrees SOA, with a coil angle of 5 degrees. Pt's motor threshold was calculated using the [employer]'s proprietary MT Assist algorithm, which produced a calculated motor threshold of 1.33 SMT. Per these findings, pt's treatment parameters are as follows: A/P -- 10.5 cm, SOA -- 30 degrees, Coil Angle --  +5 degrees, Motor Threshold -- 1.27 SMT. With these parameters, the pt will receive 36 sessions of TMS according to the following protocol: 3000 pulses per session, with stimulation in bursts of pulses lasting 4 seconds at a frequency of 10 Hz, separated by 26 seconds of rest. After determining pt's tx parameters, coil was moved to the treatment location, and the first burst of pulses was applied at a reduced power of 80% MT. Pt reported no complaints, and stated that the stimulation was tolerable. Upon completion of mapping, pt completed a few treatment intervals for observation of side effects. Pt tolerated tx well. Pt departed from clinic without issue.

## 2020-07-28 ENCOUNTER — Other Ambulatory Visit: Payer: Self-pay

## 2020-07-28 ENCOUNTER — Other Ambulatory Visit (HOSPITAL_COMMUNITY): Payer: BC Managed Care – PPO | Attending: Psychiatry

## 2020-07-28 DIAGNOSIS — F332 Major depressive disorder, recurrent severe without psychotic features: Secondary | ICD-10-CM | POA: Diagnosis not present

## 2020-07-28 NOTE — Progress Notes (Addendum)
Patient reported to [facility] for Repetitive Transcranial Magnetic Stimulation treatment for severe episode of recurrent major depressive disorder, without psychotic features. Patient presented with appropriate affect, level mood and denied any suicidal or homicidal ideations. Patient denies any other current symptoms and remains optimistic with continued [place] treatment. Patient reported no change in alcohol/substance use, caffeine consumption, sleep pattern or metal implant status since previous tx. Pt watched TV. Power was titrated to 90% for the duration of tx and will remain there until patient gets adjusted. Patient reported no complaints or discomfort. Patient departed post-treatment with no concerns or complaints. Pt completed a PHQ-9 with a score of 18 (moderately severe depression). Pt also completed a Beck's Depression Inventory with a score of 38 (severe depression).

## 2020-08-01 ENCOUNTER — Other Ambulatory Visit (HOSPITAL_COMMUNITY): Payer: BC Managed Care – PPO | Attending: Psychiatry

## 2020-08-01 ENCOUNTER — Other Ambulatory Visit: Payer: Self-pay

## 2020-08-01 DIAGNOSIS — F332 Major depressive disorder, recurrent severe without psychotic features: Secondary | ICD-10-CM | POA: Diagnosis not present

## 2020-08-01 NOTE — Progress Notes (Signed)
Patient reported to [facility] for Repetitive Transcranial Magnetic Stimulation treatment for severe episode of recurrent major depressive disorder, without psychotic features. Patient presented with appropriate affect, level mood and denied any suicidal or homicidal ideations. Patient denies any other current symptoms and remains optimistic with continued [place] treatment. Patient reported no change in alcohol/substance use, caffeine consumption, sleep pattern or metal implant status since previous tx. Power was titrated to 100% for the duration of tx and will remain there until patient gets adjusted. Patient reported no complaints or discomfort. Patient departed post-treatment with no concerns or complaints.

## 2020-08-02 ENCOUNTER — Other Ambulatory Visit: Payer: Self-pay

## 2020-08-02 ENCOUNTER — Other Ambulatory Visit (INDEPENDENT_AMBULATORY_CARE_PROVIDER_SITE_OTHER): Payer: BC Managed Care – PPO

## 2020-08-02 DIAGNOSIS — F332 Major depressive disorder, recurrent severe without psychotic features: Secondary | ICD-10-CM

## 2020-08-02 NOTE — Progress Notes (Signed)
Patient reported to [facility] for Repetitive Transcranial Magnetic Stimulation treatment for severe episode of recurrent major depressive disorder, without psychotic features. Patient presented with appropriate affect, level mood and denied any suicidal or homicidal ideations. Patient denies any other current symptoms and remains optimistic with continued [place] treatment. Patient reported no change in alcohol/substance use, caffeine consumption, sleep pattern or metal implant status since previous tx. Power was titrated to110% for the duration of tx and will remain there until patient gets adjusted. Patient reported no complaints or discomfort. Patient departed post-treatment with no concerns or complaints.

## 2020-08-03 ENCOUNTER — Other Ambulatory Visit (INDEPENDENT_AMBULATORY_CARE_PROVIDER_SITE_OTHER): Payer: BC Managed Care – PPO

## 2020-08-03 ENCOUNTER — Other Ambulatory Visit: Payer: Self-pay

## 2020-08-03 DIAGNOSIS — F332 Major depressive disorder, recurrent severe without psychotic features: Secondary | ICD-10-CM

## 2020-08-03 NOTE — Progress Notes (Signed)
Patient reported to [facility] for Repetitive Transcranial Magnetic Stimulation treatment for severe episode of recurrent major depressive disorder, without psychotic features. Patient presented with appropriate affect, level mood and denied any suicidal or homicidal ideations. Patient denies any other current symptoms and remains optimistic with continued [place] treatment. Patient reported no change in alcohol/substance use, caffeine consumption, sleep pattern or metal implant status since previous tx. Power was titrated to120% for the duration of tx and will remain there until patient gets adjusted. Patient reported no complaints or discomfort. Patient departed post-treatment with no concerns or complaints.

## 2020-08-04 ENCOUNTER — Other Ambulatory Visit: Payer: Self-pay

## 2020-08-04 ENCOUNTER — Other Ambulatory Visit (INDEPENDENT_AMBULATORY_CARE_PROVIDER_SITE_OTHER): Payer: BC Managed Care – PPO

## 2020-08-04 DIAGNOSIS — F332 Major depressive disorder, recurrent severe without psychotic features: Secondary | ICD-10-CM

## 2020-08-04 NOTE — Progress Notes (Addendum)
Patient reported to [facility] for Repetitive Transcranial Magnetic Stimulation treatment for severe episode of recurrent major depressive disorder, without psychotic features. Patient presented with appropriate affect, level mood and denied any suicidal or homicidal ideations. Patient denies any other current symptoms and remains optimistic with continued [place] treatment. Patient reported no change in alcohol/substance use, caffeine consumption, sleep pattern or metal implant status since previous tx. Power was titrated to120% for the duration of tx and will remain there until patient gets adjusted. Patient reported no complaints or discomfort. Patient departed post-treatment with no concerns or complaints. Pt completed a PHQ-9 with a score of 18.

## 2020-08-07 ENCOUNTER — Other Ambulatory Visit (INDEPENDENT_AMBULATORY_CARE_PROVIDER_SITE_OTHER): Payer: BC Managed Care – PPO

## 2020-08-07 ENCOUNTER — Encounter (HOSPITAL_COMMUNITY): Payer: Self-pay | Admitting: Psychiatry

## 2020-08-07 ENCOUNTER — Other Ambulatory Visit: Payer: Self-pay

## 2020-08-07 ENCOUNTER — Telehealth (INDEPENDENT_AMBULATORY_CARE_PROVIDER_SITE_OTHER): Payer: BC Managed Care – PPO | Admitting: Psychiatry

## 2020-08-07 DIAGNOSIS — F330 Major depressive disorder, recurrent, mild: Secondary | ICD-10-CM

## 2020-08-07 DIAGNOSIS — F419 Anxiety disorder, unspecified: Secondary | ICD-10-CM | POA: Diagnosis not present

## 2020-08-07 DIAGNOSIS — F332 Major depressive disorder, recurrent severe without psychotic features: Secondary | ICD-10-CM | POA: Diagnosis not present

## 2020-08-07 MED ORDER — VORTIOXETINE HBR 20 MG PO TABS: ORAL_TABLET | ORAL | 1 refills | Status: DC

## 2020-08-07 NOTE — Progress Notes (Signed)
Virtual Visit via Telephone Note  I connected with Sandra [name] on 08/07/20 at  9:40 AM EDT by telephone and verified that I am speaking with the correct person using two identifiers.  Location: Patient: Work Provider: Biomedical scientist   I discussed the limitations, risks, security and privacy concerns of performing an evaluation and management service by telephone and the availability of in person appointments. I also discussed with the patient that there may be a patient responsible charge related to this service. The patient expressed understanding and agreed to proceed.   History of Present Illness: Patient is evaluated by phone session.  She is started [place] treatment and so far she had 7 sessions.  She had not seen a huge improvement but endorsed her sleep is better.  She is no longer taking melatonin.  She still have chronic symptoms of depression which she described ruminative and negative thoughts but no suicidal thoughts.  She denies any hallucination, paranoia.  She still have feelings of hopelessness and lack of motivation to do things.  She used to enjoy making cards and crafts which she has not done in a while.  She is hoping further treatment of [place] may help her depression.  Her appetite is fair.  Her weight fluctuates.  She is taking Trintellix which had better response than previous antidepressant.  Patient works for [employer].  Recently there are changes at work and that helped her stress.  She like to keep food current dose of Trintellix and [place] treatment.  Past Psychiatric History: [facility]. Tried Paxil, Lexapro, ativan and trazadonebut don'tremember. Lamictal did not help and Wellbutrin causeddepression. We tried EffexorandProzac butdid not help. Noh/opsychosis, suicidal attempt or Inpatient.   Psychiatric Specialty Exam: Physical Exam  Review of Systems  Weight 220 lb (99.8 kg).There is no height or weight on file to  calculate BMI.  General Appearance: NA  Eye Contact:  NA  Speech:  Normal Rate  Volume:  Normal  Mood:  Dysphoric  Affect:  NA  Thought Process:  Goal Directed  Orientation:  Full (Time, Place, and Person)  Thought Content:  Rumination  Suicidal Thoughts:  No  Homicidal Thoughts:  No  Memory:  Immediate;   Good Recent;   Good Remote;   Good  Judgement:  Intact  Insight:  Present  Psychomotor Activity:  NA  Concentration:  Concentration: Fair and Attention Span: Fair  Recall:  [place] of Knowledge:  Good  Language:  Good  Akathisia:  No  Handed:  Right  AIMS (if indicated):     Assets:  Communication Skills Desire for Improvement Financial Resources/Insurance Housing Social Support Talents/Skills Transportation  ADL's:  Intact  Cognition:  WNL  Sleep:   8 hrs     Assessment and Plan: Major depressive disorder, recurrent.  Anxiety.  Patient like to keep the Trintellix 20 mg and like to continue [place] evaluation as she only had 7 sessions.  I encourage that she need to follow up with her therapist appointment as her previous therapist had left and she has not called back to the office to get a new therapist.  She agreed with the plan.  So far patient has no concerns from the Trintellix.  Discussed medication side effects and benefits.  Patient like to follow up in 2 months.  Her PHQ is 7.  Recommended to call us back if she is any question or any concern.  Follow-up in 2 months.  Follow Up Instructions:  I discussed the assessment and treatment plan with the patient. The patient was provided an opportunity to ask questions and all were answered. The patient agreed with the plan and demonstrated an understanding of the instructions.   The patient was advised to call back or seek an in-person evaluation if the symptoms worsen or if the condition fails to improve as anticipated.  I provided 19 minutes of non-face-to-face time during this encounter.   Kathlee Nations,  MD

## 2020-08-07 NOTE — Progress Notes (Signed)
Patient reported to [facility] for Repetitive Transcranial Magnetic Stimulation treatment for severe episode of recurrent major depressive disorder, without psychotic features. Patient presented with appropriate affect, level mood and denied any suicidal or homicidal ideations. Patient denies any other current symptoms and remains optimistic with continued [place] treatment. Patient reported no change in alcohol/substance use, caffeine consumption, sleep pattern or metal implant status since previous tx. Power was titrated to120% for the duration of tx and will remain there until patient gets adjusted. Patient reported no complaints or discomfort. Patient departed post-treatment with no concerns or complaints.

## 2020-08-08 ENCOUNTER — Other Ambulatory Visit: Payer: Self-pay

## 2020-08-08 ENCOUNTER — Other Ambulatory Visit (INDEPENDENT_AMBULATORY_CARE_PROVIDER_SITE_OTHER): Payer: BC Managed Care – PPO

## 2020-08-08 DIAGNOSIS — F332 Major depressive disorder, recurrent severe without psychotic features: Secondary | ICD-10-CM | POA: Diagnosis not present

## 2020-08-08 NOTE — Progress Notes (Signed)
Patient reported to [facility] for Repetitive Transcranial Magnetic Stimulation treatment for severe episode of recurrent major depressive disorder, without psychotic features. Patient presented with appropriate affect, level mood and denied any suicidal or homicidal ideations. Patient denies any other current symptoms and remains optimistic with continued [place] treatment. Patient reported no change in alcohol/substance use, caffeine consumption, sleep pattern or metal implant status since previous tx. Power was titrated to120% for the duration of tx and will remain there until patient gets adjusted. Patient reported no complaints or discomfort. Patient departed post-treatment with no concerns or complaints.

## 2020-08-09 ENCOUNTER — Other Ambulatory Visit (INDEPENDENT_AMBULATORY_CARE_PROVIDER_SITE_OTHER): Payer: BC Managed Care – PPO

## 2020-08-09 ENCOUNTER — Other Ambulatory Visit: Payer: Self-pay

## 2020-08-09 DIAGNOSIS — F332 Major depressive disorder, recurrent severe without psychotic features: Secondary | ICD-10-CM

## 2020-08-09 NOTE — Progress Notes (Signed)
Patient reported to [facility] for Repetitive Transcranial Magnetic Stimulation treatment for severe episode of recurrent major depressive disorder, without psychotic features. Patient presented with appropriate affect, level mood and denied any suicidal or homicidal ideations. Patient denies any other current symptoms and remains optimistic with continued [place] treatment. Patient reported no change in alcohol/substance use, caffeine consumption, sleep pattern or metal implant status since previous tx. Power was titrated to120% for the duration of tx and will remain there until patient gets adjusted. Patient reported no complaints or discomfort. Patient departed post-treatment with no concerns or complaints.

## 2020-08-10 ENCOUNTER — Other Ambulatory Visit: Payer: Self-pay

## 2020-08-10 ENCOUNTER — Other Ambulatory Visit (INDEPENDENT_AMBULATORY_CARE_PROVIDER_SITE_OTHER): Payer: BC Managed Care – PPO

## 2020-08-10 DIAGNOSIS — F332 Major depressive disorder, recurrent severe without psychotic features: Secondary | ICD-10-CM

## 2020-08-10 NOTE — Progress Notes (Signed)
Patient reported to [facility] for Repetitive Transcranial Magnetic Stimulation treatment for severe episode of recurrent major depressive disorder, without psychotic features. Patient presented with appropriate affect, level mood and denied any suicidal or homicidal ideations. Patient denies any other current symptoms and remains optimistic with continued [place] treatment. Patient reported no change in alcohol/substance use, caffeine consumption, sleep pattern or metal implant status since previous tx. Power was titrated to120% for the duration of tx and will remain there until patient gets adjusted. Patient reported no complaints or discomfort. Patient departed post-treatment with no concerns or complaints.

## 2020-08-11 ENCOUNTER — Other Ambulatory Visit: Payer: Self-pay

## 2020-08-11 ENCOUNTER — Other Ambulatory Visit (INDEPENDENT_AMBULATORY_CARE_PROVIDER_SITE_OTHER): Payer: BC Managed Care – PPO

## 2020-08-11 DIAGNOSIS — F332 Major depressive disorder, recurrent severe without psychotic features: Secondary | ICD-10-CM | POA: Diagnosis not present

## 2020-08-11 NOTE — Progress Notes (Signed)
Patient reported to [facility] for Repetitive Transcranial Magnetic Stimulation treatment for severe episode of recurrent major depressive disorder, without psychotic features. Patient presented with appropriate affect, level mood and denied any suicidal or homicidal ideations. Patient denies any other current symptoms and remains optimistic with continued [place] treatment. Patient reported no change in alcohol/substance use, caffeine consumption, sleep pattern or metal implant status since previous tx. Power was titrated to120% for the duration of tx and will remain there until patient gets adjusted. Patient reported no complaints or discomfort. Patient departed post-treatment with no concerns or complaints. Pt completed a PHQ-9 with a score of 14.

## 2020-08-14 ENCOUNTER — Other Ambulatory Visit: Payer: Self-pay

## 2020-08-14 ENCOUNTER — Other Ambulatory Visit (INDEPENDENT_AMBULATORY_CARE_PROVIDER_SITE_OTHER): Payer: BC Managed Care – PPO

## 2020-08-14 DIAGNOSIS — F332 Major depressive disorder, recurrent severe without psychotic features: Secondary | ICD-10-CM | POA: Diagnosis not present

## 2020-08-14 NOTE — Progress Notes (Signed)
Patient reported to [facility] for Repetitive Transcranial Magnetic Stimulation treatment for severe episode of recurrent major depressive disorder, without psychotic features. Patient presented with appropriate affect, level mood and denied any suicidal or homicidal ideations. Patient denies any other current symptoms and remains optimistic with continued [place] treatment. Patient reported no change in alcohol/substance use, caffeine consumption, sleep pattern or metal implant status since previous tx. Power was titrated to120% for the duration of tx and will remain there until patient gets adjusted. Patient reported no complaints or discomfort. Patient departed post-treatment with no concerns or complaints.

## 2020-08-15 ENCOUNTER — Other Ambulatory Visit (INDEPENDENT_AMBULATORY_CARE_PROVIDER_SITE_OTHER): Payer: BC Managed Care – PPO

## 2020-08-15 ENCOUNTER — Other Ambulatory Visit: Payer: Self-pay

## 2020-08-15 DIAGNOSIS — F332 Major depressive disorder, recurrent severe without psychotic features: Secondary | ICD-10-CM | POA: Diagnosis not present

## 2020-08-15 NOTE — Progress Notes (Signed)
Patient reported to [facility] for Repetitive Transcranial Magnetic Stimulation treatment for severe episode of recurrent major depressive disorder, without psychotic features. Patient presented with appropriate affect, level mood and denied any suicidal or homicidal ideations. Patient denies any other current symptoms and remains optimistic with continued [place] treatment. Patient reported no change in alcohol/substance use, caffeine consumption, sleep pattern or metal implant status since previous tx. Power was titrated to120% for the duration of tx and will remain there until patient gets adjusted. Patient reported no complaints or discomfort. Patient departed post-treatment with no concerns or complaints.

## 2020-08-16 ENCOUNTER — Other Ambulatory Visit (INDEPENDENT_AMBULATORY_CARE_PROVIDER_SITE_OTHER): Payer: BC Managed Care – PPO

## 2020-08-16 ENCOUNTER — Other Ambulatory Visit: Payer: Self-pay

## 2020-08-16 DIAGNOSIS — F332 Major depressive disorder, recurrent severe without psychotic features: Secondary | ICD-10-CM

## 2020-08-16 NOTE — Progress Notes (Signed)
Patient reported to [facility] for Repetitive Transcranial Magnetic Stimulation treatment for severe episode of recurrent major depressive disorder, without psychotic features. Patient presented with appropriate affect, level mood and denied any suicidal or homicidal ideations. Patient denies any other current symptoms and remains optimistic with continued [place] treatment. Patient reported no change in alcohol/substance use, caffeine consumption, sleep pattern or metal implant status since previous tx. Power was titrated to120% for the duration of tx and will remain there until patient gets adjusted. Patient reported no complaints or discomfort. Patient departed post-treatment with no concerns or complaints.

## 2020-08-17 ENCOUNTER — Other Ambulatory Visit (INDEPENDENT_AMBULATORY_CARE_PROVIDER_SITE_OTHER): Payer: BC Managed Care – PPO

## 2020-08-17 ENCOUNTER — Other Ambulatory Visit: Payer: Self-pay

## 2020-08-17 DIAGNOSIS — F332 Major depressive disorder, recurrent severe without psychotic features: Secondary | ICD-10-CM | POA: Diagnosis not present

## 2020-08-17 NOTE — Progress Notes (Signed)
Patient reported to [facility] for Repetitive Transcranial Magnetic Stimulation treatment for severe episode of recurrent major depressive disorder, without psychotic features. Patient presented with appropriate affect, level mood and denied any suicidal or homicidal ideations. Patient denies any other current symptoms and remains optimistic with continued [place] treatment. Patient reported no change in alcohol/substance use, caffeine consumption, sleep pattern or metal implant status since previous tx. Power was titrated to120% for the duration of tx and will remain there until patient gets adjusted. Patient reported no complaints or discomfort. Patient departed post-treatment with no concerns or complaints.

## 2020-08-18 ENCOUNTER — Other Ambulatory Visit (INDEPENDENT_AMBULATORY_CARE_PROVIDER_SITE_OTHER): Payer: BC Managed Care – PPO

## 2020-08-18 ENCOUNTER — Other Ambulatory Visit: Payer: Self-pay

## 2020-08-18 DIAGNOSIS — F332 Major depressive disorder, recurrent severe without psychotic features: Secondary | ICD-10-CM

## 2020-08-18 NOTE — Progress Notes (Signed)
Patient reported to [facility] for Repetitive Transcranial Magnetic Stimulation treatment for severe episode of recurrent major depressive disorder, without psychotic features. Patient presented with appropriate affect, level mood and denied any suicidal or homicidal ideations. Patient denies any other current symptoms and remains optimistic with continued [place] treatment. Patient reported no change in alcohol/substance use, caffeine consumption, sleep pattern or metal implant status since previous tx. Power was titrated to120% for the duration of tx and will remain there until patient gets adjusted. Patient reported no complaints or discomfort. Patient departed post-treatment with no concerns or complaints.Pt completed a PHQ-9 with a score of 13.

## 2020-08-21 ENCOUNTER — Other Ambulatory Visit: Payer: Self-pay

## 2020-08-21 ENCOUNTER — Other Ambulatory Visit (INDEPENDENT_AMBULATORY_CARE_PROVIDER_SITE_OTHER): Payer: BC Managed Care – PPO

## 2020-08-21 DIAGNOSIS — F331 Major depressive disorder, recurrent, moderate: Secondary | ICD-10-CM | POA: Diagnosis not present

## 2020-08-21 DIAGNOSIS — F332 Major depressive disorder, recurrent severe without psychotic features: Secondary | ICD-10-CM | POA: Diagnosis not present

## 2020-08-21 NOTE — Progress Notes (Signed)
Patient reported to [facility] for Repetitive Transcranial Magnetic Stimulation treatment for severe episode of recurrent major depressive disorder, without psychotic features. Patient presented with appropriate affect, level mood and denied any suicidal or homicidal ideations. Patient denies any other current symptoms and remains optimistic with continued [place] treatment. Patient reported no change in alcohol/substance use, caffeine consumption, sleep pattern or metal implant status since previous tx. Power was titrated to120% for the duration of tx and will remain there until patient gets adjusted. Patient reported no complaints or discomfort. Patient departed post-treatment with no concerns or complaints.Pt states she feels more energetic since starting [place] therapy.

## 2020-08-22 ENCOUNTER — Other Ambulatory Visit: Payer: Self-pay

## 2020-08-22 ENCOUNTER — Other Ambulatory Visit (INDEPENDENT_AMBULATORY_CARE_PROVIDER_SITE_OTHER): Payer: BC Managed Care – PPO

## 2020-08-22 DIAGNOSIS — F332 Major depressive disorder, recurrent severe without psychotic features: Secondary | ICD-10-CM

## 2020-08-22 NOTE — Progress Notes (Signed)
Patient reported to [facility] for Repetitive Transcranial Magnetic Stimulation treatment for severe episode of recurrent major depressive disorder, without psychotic features. Patient presented with appropriate affect, level mood and denied any suicidal or homicidal ideations. Patient denies any other current symptoms and remains optimistic with continued [place] treatment. Patient reported no change in alcohol/substance use, caffeine consumption, sleep pattern or metal implant status since previous tx. Power was titrated to120% for the duration of tx and will remain there until patient gets adjusted. Patient reported no complaints or discomfort. Patient departed post-treatment with no concerns or complaints.

## 2020-08-23 ENCOUNTER — Other Ambulatory Visit (INDEPENDENT_AMBULATORY_CARE_PROVIDER_SITE_OTHER): Payer: BC Managed Care – PPO

## 2020-08-23 ENCOUNTER — Other Ambulatory Visit: Payer: Self-pay

## 2020-08-23 DIAGNOSIS — F332 Major depressive disorder, recurrent severe without psychotic features: Secondary | ICD-10-CM | POA: Diagnosis not present

## 2020-08-23 NOTE — Progress Notes (Signed)
Patient reported to [facility] for Repetitive Transcranial Magnetic Stimulation treatment for severe episode of recurrent major depressive disorder, without psychotic features. Patient presented with appropriate affect, level mood and denied any suicidal or homicidal ideations. Patient denies any other current symptoms and remains optimistic with continued [place] treatment. Patient reported no change in alcohol/substance use, caffeine consumption, sleep pattern or metal implant status since previous tx. Power was titrated to120% for the duration of tx and will remain there until patient gets adjusted. Patient reported no complaints or discomfort. Patient departed post-treatment with no concerns or complaints.

## 2020-08-24 ENCOUNTER — Other Ambulatory Visit: Payer: Self-pay

## 2020-08-24 ENCOUNTER — Other Ambulatory Visit (INDEPENDENT_AMBULATORY_CARE_PROVIDER_SITE_OTHER): Payer: BC Managed Care – PPO

## 2020-08-24 DIAGNOSIS — F332 Major depressive disorder, recurrent severe without psychotic features: Secondary | ICD-10-CM | POA: Diagnosis not present

## 2020-08-24 NOTE — Progress Notes (Signed)
Patient reported to [facility] for Repetitive Transcranial Magnetic Stimulation treatment for severe episode of recurrent major depressive disorder, without psychotic features. Patient presented with appropriate affect, level mood and denied any suicidal or homicidal ideations. Patient denies any other current symptoms and remains optimistic with continued [place] treatment. Patient reported no change in alcohol/substance use, caffeine consumption, sleep pattern or metal implant status since previous tx. Power was titrated to120% for the duration of tx and will remain there until patient gets adjusted. Patient reported no complaints or discomfort. Patient departed post-treatment with no concerns or complaints.

## 2020-08-25 ENCOUNTER — Other Ambulatory Visit: Payer: Self-pay

## 2020-08-25 ENCOUNTER — Other Ambulatory Visit (INDEPENDENT_AMBULATORY_CARE_PROVIDER_SITE_OTHER): Payer: BC Managed Care – PPO

## 2020-08-25 ENCOUNTER — Encounter (HOSPITAL_COMMUNITY): Payer: BC Managed Care – PPO

## 2020-08-25 DIAGNOSIS — F332 Major depressive disorder, recurrent severe without psychotic features: Secondary | ICD-10-CM

## 2020-08-25 NOTE — Progress Notes (Signed)
Patient reported to [facility] for Repetitive Transcranial Magnetic Stimulation treatment for severe episode of recurrent major depressive disorder, without psychotic features. Patient presented with appropriate affect, level mood and denied any suicidal or homicidal ideations. Patient denies any other current symptoms and remains optimistic with continued [place] treatment. Patient reported no change in alcohol/substance use, caffeine consumption, sleep pattern or metal implant status since previous tx. Power was titrated to120% for the duration of tx and will remain there until patient gets adjusted. Patient reported no complaints or discomfort. Patient departed post-treatment with no concerns or complaints.Pt completed a PHQ-9 with a score of 20.

## 2020-08-30 ENCOUNTER — Other Ambulatory Visit: Payer: Self-pay

## 2020-08-30 ENCOUNTER — Other Ambulatory Visit (HOSPITAL_COMMUNITY): Payer: BC Managed Care – PPO | Attending: Psychiatry

## 2020-08-30 DIAGNOSIS — F332 Major depressive disorder, recurrent severe without psychotic features: Secondary | ICD-10-CM

## 2020-08-30 NOTE — Progress Notes (Signed)
Patient reported to [facility] for Repetitive Transcranial Magnetic Stimulation treatment for severe episode of recurrent major depressive disorder, without psychotic features. Patient presented with appropriate affect, level mood and denied any suicidal or homicidal ideations. Patient denies any other current symptoms and remains optimistic with continued [place] treatment. Patient reported no change in alcohol/substance use, caffeine consumption, sleep pattern or metal implant status since previous tx. Power was titrated to120% for the duration of tx and will remain there until patient gets adjusted. Patient reported no complaints or discomfort. Patient departed post-treatment with no concerns or complaints.

## 2020-08-31 ENCOUNTER — Other Ambulatory Visit: Payer: Self-pay

## 2020-08-31 ENCOUNTER — Other Ambulatory Visit (INDEPENDENT_AMBULATORY_CARE_PROVIDER_SITE_OTHER): Payer: BC Managed Care – PPO

## 2020-08-31 DIAGNOSIS — F331 Major depressive disorder, recurrent, moderate: Secondary | ICD-10-CM | POA: Diagnosis not present

## 2020-08-31 DIAGNOSIS — F332 Major depressive disorder, recurrent severe without psychotic features: Secondary | ICD-10-CM | POA: Diagnosis not present

## 2020-08-31 NOTE — Progress Notes (Signed)
Patient reported to [facility] for Repetitive Transcranial Magnetic Stimulation treatment for severe episode of recurrent major depressive disorder, without psychotic features. Patient presented with appropriate affect, level mood and denied any suicidal or homicidal ideations. Patient denies any other current symptoms and remains optimistic with continued [place] treatment. Patient reported no change in alcohol/substance use, caffeine consumption, sleep pattern or metal implant status since previous tx. Power was titrated to120% for the duration of tx and will remain there until patient gets adjusted. Patient reported no complaints or discomfort. Patient departed post-treatment with no concerns or complaints.

## 2020-09-01 ENCOUNTER — Other Ambulatory Visit: Payer: Self-pay

## 2020-09-01 ENCOUNTER — Other Ambulatory Visit (INDEPENDENT_AMBULATORY_CARE_PROVIDER_SITE_OTHER): Payer: BC Managed Care – PPO

## 2020-09-01 DIAGNOSIS — F332 Major depressive disorder, recurrent severe without psychotic features: Secondary | ICD-10-CM | POA: Diagnosis not present

## 2020-09-01 NOTE — Progress Notes (Signed)
Patient reported to [facility] for Repetitive Transcranial Magnetic Stimulation treatment for severe episode of recurrent major depressive disorder, without psychotic features. Patient presented with appropriate affect, level mood and denied any suicidal or homicidal ideations. Patient denies any other current symptoms and remains optimistic with continued [place] treatment. Patient reported no change in alcohol/substance use, caffeine consumption, sleep pattern or metal implant status since previous tx. Power was titrated to120% for the duration of tx and will remain there until patient gets adjusted. Patient reported no complaints or discomfort. Patient departed post-treatment with no concerns or complaints. Pt completed a PHQ-9 with a score of 18.

## 2020-09-04 ENCOUNTER — Other Ambulatory Visit (INDEPENDENT_AMBULATORY_CARE_PROVIDER_SITE_OTHER): Payer: BC Managed Care – PPO

## 2020-09-04 ENCOUNTER — Other Ambulatory Visit: Payer: Self-pay

## 2020-09-04 DIAGNOSIS — F332 Major depressive disorder, recurrent severe without psychotic features: Secondary | ICD-10-CM

## 2020-09-04 NOTE — Progress Notes (Signed)
Patient reported to [facility] for Repetitive Transcranial Magnetic Stimulation treatment for severe episode of recurrent major depressive disorder, without psychotic features. Patient presented with appropriate affect, level mood and denied any suicidal or homicidal ideations. Patient denies any other current symptoms and remains optimistic with continued [place] treatment. Patient reported no change in alcohol/substance use, caffeine consumption, sleep pattern or metal implant status since previous tx. Power was titrated to120% for the duration of tx and will remain there until patient gets adjusted. Patient reported no complaints or discomfort. Patient departed post-treatment with no concerns or complaints.

## 2020-09-05 ENCOUNTER — Other Ambulatory Visit: Payer: Self-pay

## 2020-09-05 ENCOUNTER — Other Ambulatory Visit (INDEPENDENT_AMBULATORY_CARE_PROVIDER_SITE_OTHER): Payer: BC Managed Care – PPO

## 2020-09-05 DIAGNOSIS — F332 Major depressive disorder, recurrent severe without psychotic features: Secondary | ICD-10-CM

## 2020-09-05 NOTE — Progress Notes (Signed)
Patient reported to [facility] for Repetitive Transcranial Magnetic Stimulation treatment for severe episode of recurrent major depressive disorder, without psychotic features. Patient presented with appropriate affect, level mood and denied any suicidal or homicidal ideations. Patient denies any other current symptoms and remains optimistic with continued [place] treatment. Patient reported no change in alcohol/substance use, caffeine consumption, sleep pattern or metal implant status since previous tx. Power was titrated to120% for the duration of tx and will remain there until patient gets adjusted. Patient reported no complaints or discomfort. Patient departed post-treatment with no concerns or complaints.

## 2020-09-06 ENCOUNTER — Other Ambulatory Visit: Payer: Self-pay

## 2020-09-06 ENCOUNTER — Other Ambulatory Visit (INDEPENDENT_AMBULATORY_CARE_PROVIDER_SITE_OTHER): Payer: BC Managed Care – PPO

## 2020-09-06 DIAGNOSIS — F332 Major depressive disorder, recurrent severe without psychotic features: Secondary | ICD-10-CM

## 2020-09-06 NOTE — Progress Notes (Signed)
Patient reported to [facility] for Repetitive Transcranial Magnetic Stimulation treatment for severe episode of recurrent major depressive disorder, without psychotic features. Patient presented with appropriate affect, level mood and denied any suicidal or homicidal ideations. Patient denies any other current symptoms and remains optimistic with continued [place] treatment. Patient reported no change in alcohol/substance use, caffeine consumption, sleep pattern or metal implant status since previous tx. Power was titrated to120% for the duration of tx and will remain there until patient gets adjusted. Patient reported no complaints or discomfort. Patient departed post-treatment with no concerns or complaints.

## 2020-09-07 ENCOUNTER — Other Ambulatory Visit: Payer: Self-pay

## 2020-09-07 ENCOUNTER — Other Ambulatory Visit (INDEPENDENT_AMBULATORY_CARE_PROVIDER_SITE_OTHER): Payer: BC Managed Care – PPO

## 2020-09-07 DIAGNOSIS — F332 Major depressive disorder, recurrent severe without psychotic features: Secondary | ICD-10-CM

## 2020-09-07 DIAGNOSIS — F331 Major depressive disorder, recurrent, moderate: Secondary | ICD-10-CM | POA: Diagnosis not present

## 2020-09-07 NOTE — Progress Notes (Signed)
Patient reported to [facility] for Repetitive Transcranial Magnetic Stimulation treatment for severe episode of recurrent major depressive disorder, without psychotic features. Patient presented with appropriate affect, level mood and denied any suicidal or homicidal ideations. Patient denies any other current symptoms and remains optimistic with continued [place] treatment. Patient reported no change in alcohol/substance use, caffeine consumption, sleep pattern or metal implant status since previous tx. Power was titrated to 120% for the duration of tx and will remain there until patient gets adjusted. Patient reported no complaints or discomfort. Patient departed post-treatment with no concerns or complaints.

## 2020-09-08 ENCOUNTER — Other Ambulatory Visit (INDEPENDENT_AMBULATORY_CARE_PROVIDER_SITE_OTHER): Payer: BC Managed Care – PPO

## 2020-09-08 ENCOUNTER — Other Ambulatory Visit: Payer: Self-pay

## 2020-09-08 DIAGNOSIS — F332 Major depressive disorder, recurrent severe without psychotic features: Secondary | ICD-10-CM | POA: Diagnosis not present

## 2020-09-08 NOTE — Progress Notes (Signed)
Patient reported to [facility] for Repetitive Transcranial Magnetic Stimulation treatment for severe episode of recurrent major depressive disorder, without psychotic features. Patient presented with appropriate affect, level mood and denied any suicidal or homicidal ideations. Patient denies any other current symptoms and remains optimistic with continued [place] treatment. Patient reported no change in alcohol/substance use, caffeine consumption, sleep pattern or metal implant status since previous tx. Power was titrated to 120% for the duration of tx and will remain there until patient gets adjusted. Patient reported no complaints or discomfort. Patient departed post-treatment with no concerns or complaints. Pt completed a PHQ-9 with a score of 11.

## 2020-09-11 ENCOUNTER — Other Ambulatory Visit (INDEPENDENT_AMBULATORY_CARE_PROVIDER_SITE_OTHER): Payer: BC Managed Care – PPO

## 2020-09-11 ENCOUNTER — Other Ambulatory Visit: Payer: Self-pay

## 2020-09-11 DIAGNOSIS — F332 Major depressive disorder, recurrent severe without psychotic features: Secondary | ICD-10-CM | POA: Diagnosis not present

## 2020-09-11 NOTE — Progress Notes (Signed)
Patient reported to [facility] for Repetitive Transcranial Magnetic Stimulation treatment for severe episode of recurrent major depressive disorder, without psychotic features. Patient presented with appropriate affect, level mood and denied any suicidal or homicidal ideations. Patient denies any other current symptoms and remains optimistic with continued [place] treatment. Patient reported no change in alcohol/substance use, caffeine consumption, sleep pattern or metal implant status since previous tx. Power was titrated to 120% for the duration of tx and will remain there until patient gets adjusted. Patient reported no complaints or discomfort. Patient departed post-treatment with no concerns or complaints.

## 2020-09-13 ENCOUNTER — Other Ambulatory Visit: Payer: Self-pay

## 2020-09-13 ENCOUNTER — Other Ambulatory Visit (INDEPENDENT_AMBULATORY_CARE_PROVIDER_SITE_OTHER): Payer: BC Managed Care – PPO

## 2020-09-13 DIAGNOSIS — F332 Major depressive disorder, recurrent severe without psychotic features: Secondary | ICD-10-CM

## 2020-09-13 NOTE — Progress Notes (Signed)
Patient reported to [facility] for Repetitive Transcranial Magnetic Stimulation treatment for severe episode of recurrent major depressive disorder, without psychotic features. Patient presented with appropriate affect, level mood and denied any suicidal or homicidal ideations. Patient denies any other current symptoms and remains optimistic with continued [place] treatment. Patient reported no change in alcohol/substance use, caffeine consumption, sleep pattern or metal implant status since previous tx. Power was titrated to 120% for the duration of tx and will remain there until patient gets adjusted. Patient reported no complaints or discomfort. Patient departed post-treatment with no concerns or complaints.

## 2020-09-14 ENCOUNTER — Other Ambulatory Visit (INDEPENDENT_AMBULATORY_CARE_PROVIDER_SITE_OTHER): Payer: BC Managed Care – PPO

## 2020-09-14 DIAGNOSIS — F331 Major depressive disorder, recurrent, moderate: Secondary | ICD-10-CM | POA: Diagnosis not present

## 2020-09-14 DIAGNOSIS — F332 Major depressive disorder, recurrent severe without psychotic features: Secondary | ICD-10-CM | POA: Diagnosis not present

## 2020-09-14 NOTE — Progress Notes (Signed)
Patient reported to [facility] for Repetitive Transcranial Magnetic Stimulation treatment for severe episode of recurrent major depressive disorder, without psychotic features. Patient presented with appropriate affect, level mood and denied any suicidal or homicidal ideations. Patient denies any other current symptoms and remains optimistic with continued [place] treatment. Patient reported no change in alcohol/substance use, caffeine consumption, sleep pattern or metal implant status since previous tx. Power was titrated to 120% for the duration of tx and will remain there until patient gets adjusted. Patient reported no complaints or discomfort. Patient departed post-treatment with no concerns or complaints. Pt completed a PHQ-9 with a score of 10.

## 2020-09-18 ENCOUNTER — Other Ambulatory Visit: Payer: Self-pay

## 2020-09-18 ENCOUNTER — Other Ambulatory Visit (INDEPENDENT_AMBULATORY_CARE_PROVIDER_SITE_OTHER): Payer: BC Managed Care – PPO

## 2020-09-18 DIAGNOSIS — F332 Major depressive disorder, recurrent severe without psychotic features: Secondary | ICD-10-CM

## 2020-09-18 NOTE — Progress Notes (Signed)
Patient reported to [facility] for Repetitive Transcranial Magnetic Stimulation treatment for severe episode of recurrent major depressive disorder, without psychotic features. Patient presented with appropriate affect, level mood and denied any suicidal or homicidal ideations. Patient denies any other current symptoms and remains optimistic with continued [place] treatment. Patient reported no change in alcohol/substance use, caffeine consumption, sleep pattern or metal implant status since previous tx. Power was titrated to 120% for the duration of tx and will remain there until patient gets adjusted. Patient reported no complaints or discomfort. Patient departed post-treatment with no concerns or complaints.

## 2020-09-19 ENCOUNTER — Other Ambulatory Visit: Payer: Self-pay

## 2020-09-19 ENCOUNTER — Other Ambulatory Visit (INDEPENDENT_AMBULATORY_CARE_PROVIDER_SITE_OTHER): Payer: BC Managed Care – PPO

## 2020-09-19 DIAGNOSIS — F332 Major depressive disorder, recurrent severe without psychotic features: Secondary | ICD-10-CM | POA: Diagnosis not present

## 2020-09-19 NOTE — Progress Notes (Signed)
Patient reported to [facility] for Repetitive Transcranial Magnetic Stimulation treatment for severe episode of recurrent major depressive disorder, without psychotic features. Patient presented with appropriate affect, level mood and denied any suicidal or homicidal ideations. Patient denies any other current symptoms and remains optimistic with continued [place] treatment. Patient reported no change in alcohol/substance use, caffeine consumption, sleep pattern or metal implant status since previous tx. Power was titrated to 120% for the duration of tx and will remain there until patient gets adjusted. Patient reported no complaints or discomfort. Patient departed post-treatment with no concerns or complaints. Pt completed a PHQ-9 with a score of 10.

## 2020-09-26 ENCOUNTER — Other Ambulatory Visit: Payer: Self-pay

## 2020-09-26 ENCOUNTER — Other Ambulatory Visit (INDEPENDENT_AMBULATORY_CARE_PROVIDER_SITE_OTHER): Payer: BC Managed Care – PPO

## 2020-09-26 DIAGNOSIS — F332 Major depressive disorder, recurrent severe without psychotic features: Secondary | ICD-10-CM

## 2020-09-26 NOTE — Progress Notes (Signed)
Patient reported to [facility] for Repetitive Transcranial Magnetic Stimulation treatment for severe episode of recurrent major depressive disorder, without psychotic features. Patient presented with appropriate affect, level mood and denied any suicidal or homicidal ideations. Patient denies any other current symptoms and remains optimistic with continued [place] treatment. Patient reported no change in alcohol/substance use, caffeine consumption, sleep pattern or metal implant status since previous tx. Power was titrated to 120% for the duration of tx and will remain there until patient gets adjusted. Patient reported no complaints or discomfort. Patient departed post-treatment with no concerns or complaints.

## 2020-09-27 ENCOUNTER — Other Ambulatory Visit (INDEPENDENT_AMBULATORY_CARE_PROVIDER_SITE_OTHER): Payer: BC Managed Care – PPO

## 2020-09-27 DIAGNOSIS — F332 Major depressive disorder, recurrent severe without psychotic features: Secondary | ICD-10-CM

## 2020-09-27 NOTE — Progress Notes (Signed)
Patient reported to [facility] for Repetitive Transcranial Magnetic Stimulation treatment for severe episode of recurrent major depressive disorder, without psychotic features. Patient presented with appropriate affect, level mood and denied any suicidal or homicidal ideations. Patient denies any other current symptoms and remains optimistic with continued [place] treatment. Patient reported no change in alcohol/substance use, caffeine consumption, sleep pattern or metal implant status since previous tx. Power was titrated to 120% for the duration of tx and will remain there until patient gets adjusted. Patient reported no complaints or discomfort. Patient departed post-treatment with no concerns or complaints. Pt states that she is feeling good and feels that [place] has benefited her. Pt plans to make a few life changes that will improve her mood as well. Pt completed a PHQ-9 with a score of 7. Pt will be scheduled for a 30 day follow up.

## 2020-09-28 DIAGNOSIS — F331 Major depressive disorder, recurrent, moderate: Secondary | ICD-10-CM | POA: Diagnosis not present

## 2020-10-12 DIAGNOSIS — F331 Major depressive disorder, recurrent, moderate: Secondary | ICD-10-CM | POA: Diagnosis not present

## 2020-10-19 ENCOUNTER — Other Ambulatory Visit: Payer: Self-pay

## 2020-10-19 ENCOUNTER — Encounter (HOSPITAL_COMMUNITY): Payer: Self-pay | Admitting: Psychiatry

## 2020-10-19 ENCOUNTER — Telehealth (INDEPENDENT_AMBULATORY_CARE_PROVIDER_SITE_OTHER): Payer: BC Managed Care – PPO | Admitting: Psychiatry

## 2020-10-19 DIAGNOSIS — F419 Anxiety disorder, unspecified: Secondary | ICD-10-CM | POA: Diagnosis not present

## 2020-10-19 DIAGNOSIS — F330 Major depressive disorder, recurrent, mild: Secondary | ICD-10-CM

## 2020-10-19 MED ORDER — VORTIOXETINE HBR 20 MG PO TABS: ORAL_TABLET | ORAL | 2 refills | Status: DC

## 2020-10-19 NOTE — Progress Notes (Signed)
Virtual Visit via Telephone Note  I connected with Sandra [name] on 10/19/20 at  3:20 PM EDT by telephone and verified that I am speaking with the correct person using two identifiers.  Location: Patient: Work Provider: Office   I discussed the limitations, risks, security and privacy concerns of performing an evaluation and management service by telephone and the availability of in person appointments. I also discussed with the patient that there may be a patient responsible charge related to this service. The patient expressed understanding and agreed to proceed.   History of Present Illness: Patient is evaluated by phone session.  She recently finished [place] and her last treatment was on June 29.  She noticed a huge improvement in her mood, crying spells and chronic symptoms of depression but is still sometimes unmotivated to do things.  She admitted weight gain because she is not watching her calorie intake and eating "junk" at work.  She started therapy and hoping to continue therapy to help her coping skills.  She admitted that she needs to work on her stressors to had a better management on her depressive symptoms.  She is sleeping okay.  She denies any suicidal thoughts or homicidal thought.  Her job is going well.  She is taking Trintellix and like to keep the Trintellix at present dose.   Past Psychiatric History:  [facility]  Tried Paxil, Lexapro, ativan and trazadone but don't remember.  Lamictal did not help and Wellbutrin caused depression.  We tried Effexor and Prozac but did not help.  No h/o psychosis, suicidal attempt or Inpatient.     Psychiatric Specialty Exam: Physical Exam  Review of Systems  Weight 229 lb (103.9 kg).There is no height or weight on file to calculate BMI.  General Appearance: NA  Eye Contact:  NA  Speech:  Clear and Coherent  Volume:  Normal  Mood:  Euthymic  Affect:  NA  Thought Process:  Goal Directed  Orientation:   Full (Time, Place, and Person)  Thought Content:  WDL  Suicidal Thoughts:  No  Homicidal Thoughts:  No  Memory:  Immediate;   Good Recent;   Good Remote;   Good  Judgement:  Good  Insight:  Good  Psychomotor Activity:  NA  Concentration:  Concentration: Good and Attention Span: Good  Recall:  Good  Fund of Knowledge:  Good  Language:  Good  Akathisia:  No  Handed:  Right  AIMS (if indicated):     Assets:  Communication Skills Desire for Improvement Housing Talents/Skills Transportation  ADL's:  Intact  Cognition:  WNL  Sleep:   8      Assessment and Plan: Major depressive disorder, recurrent.  Anxiety.  Patient finished [place] and she is feeling much better.  Her depression score is less than 5.  She like to keep the Trintellix and now she started therapy to help her better coping skills.  She agreed with the plan.  She like to follow up in 3 months.  Discussed medication side effects and benefits.  Recommended to call us back if she has any question or any concern.  Follow-up in 3 months.  Follow Up Instructions:    I discussed the assessment and treatment plan with the patient. The patient was provided an opportunity to ask questions and all were answered. The patient agreed with the plan and demonstrated an understanding of the instructions.   The patient was advised to call back or seek an in-person  evaluation if the symptoms worsen or if the condition fails to improve as anticipated.  I provided 13 minutes of non-face-to-face time during this encounter.   Kathlee Nations, MD

## 2020-10-26 DIAGNOSIS — F331 Major depressive disorder, recurrent, moderate: Secondary | ICD-10-CM | POA: Diagnosis not present

## 2020-11-01 DIAGNOSIS — G4710 Hypersomnia, unspecified: Secondary | ICD-10-CM | POA: Diagnosis not present

## 2020-11-02 DIAGNOSIS — G4710 Hypersomnia, unspecified: Secondary | ICD-10-CM | POA: Diagnosis not present

## 2020-11-07 DIAGNOSIS — G4733 Obstructive sleep apnea (adult) (pediatric): Secondary | ICD-10-CM | POA: Diagnosis not present

## 2020-11-08 ENCOUNTER — Telehealth (HOSPITAL_COMMUNITY): Payer: Self-pay

## 2020-11-08 NOTE — Telephone Encounter (Signed)
Pt was called to schedule a TMS follow up. Pt's schedule did not match the availability of the provider at this time. Pt states that she is doing well and TMS has helped decrease her depressive symptoms. Pt discussed follow up process if symptoms were to return. Pt was provided with information to call [place] coordinator for maintenance [place] if necessary.

## 2020-11-10 ENCOUNTER — Telehealth: Payer: Self-pay | Admitting: Hematology and Oncology

## 2020-11-10 NOTE — Telephone Encounter (Signed)
Scheduled appt per 8/12 sch msg. Called pt, no answer. Left msg with appt date and time.

## 2020-11-13 ENCOUNTER — Ambulatory Visit: Payer: BC Managed Care – PPO | Admitting: Hematology and Oncology

## 2020-11-13 NOTE — Assessment & Plan Note (Deleted)
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  Treatment plan: 1.Neoadjuvant chemotherapy with Normanna Perjeta 12/04/2017-12/26/2019followed by [place] x 1 yearcompleted 12/03/2018 2.breast conserving surgery with sentinel lymph node biopsy: 04/23/2018 3.Radiation therapy:Completed4/15/2020 4.Adjuvant antiestrogen therapystarted 12/15/2018 -------------------------------------------------------------------------------------------------------------------------------- 04/23/2018:Right lumpectomy: Small residual invasive cancer multifocal 0.15 cm, grade 2, 0/3 lymph nodes all negative, ER 90%, PR 60%, HER-2 +3+ by IHC, Ki-67 20%, RCB class I, ympT1a, ypN0  Echo 03/11/2018: EF 60-65%  Current treatment:Anastrozole 1 mg daily stopped 6/22/2021because of severe fatigue and severe joint pains switch to letrozole 11/19/2019  Letrozole toxicities: Muscle aches and pains have disappeared at half a tablet daily Continues to suffer from fatigue issues.  Breast cancer surveillance: Mammogram 04/01/2019: No evidence of malignancy breast density category B Bone density January 2021: T score -0.3: Normal  Weight issues: Patient currently weighs 227 pounds and she wants to aggressively work on losing weight  Return to clinic in 1 year for follow-up

## 2020-11-14 ENCOUNTER — Telehealth: Payer: Self-pay | Admitting: Hematology and Oncology

## 2020-11-14 NOTE — Telephone Encounter (Signed)
Scheduled appt per 8/16 sch msg. Called pt, no answer. Left msg with appt date and time.

## 2020-11-15 NOTE — Assessment & Plan Note (Deleted)
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  Treatment plan: 1.Neoadjuvant chemotherapy with [place] Perjeta 12/04/2017-12/26/2019followed by Kadcyla x 1 yearcompleted 12/03/2018 2.breast conserving surgery with sentinel lymph node biopsy: 04/23/2018 3.Radiation therapy:Completed4/15/2020 4.Adjuvant antiestrogen therapystarted 12/15/2018 -------------------------------------------------------------------------------------------------------------------------------- 04/23/2018:Right lumpectomy: Small residual invasive cancer multifocal 0.15 cm, grade 2, 0/3 lymph nodes all negative, ER 90%, PR 60%, HER-2 +3+ by IHC, Ki-67 20%, RCB class I, ympT1a, ypN0  Echo 03/11/2018: EF 60-65%  Current treatment:Anastrozole 1 mg daily stopped 6/22/2021because of severe fatigue and severe joint pains Fatigue is still persistent although it is improving therefore I discussed holding off on taking any antiestrogen therapy for another month and starting on half a tablet of letrozole daily from 11/19/2019.  Letrozole toxicities: Muscle aches and pains have disappeared at half a tablet daily Continues to suffer from fatigue issues.  Breast cancer surveillance: Mammogram 04/01/2019: No evidence of malignancy breast density category B Bone density January 2021: T score -0.3: Normal  Weight issues: Patient currently weighs 227 pounds and she wants to aggressively work on losing weight.    Follow-up in 1 year

## 2020-11-16 ENCOUNTER — Ambulatory Visit: Payer: BC Managed Care – PPO | Admitting: Hematology and Oncology

## 2020-11-16 DIAGNOSIS — C50411 Malignant neoplasm of upper-outer quadrant of right female breast: Secondary | ICD-10-CM

## 2020-11-21 DIAGNOSIS — G4733 Obstructive sleep apnea (adult) (pediatric): Secondary | ICD-10-CM | POA: Diagnosis not present

## 2020-11-29 DIAGNOSIS — F331 Major depressive disorder, recurrent, moderate: Secondary | ICD-10-CM | POA: Diagnosis not present

## 2020-11-29 NOTE — Progress Notes (Signed)
Patient Care Team: Patient, No Pcp Per (Inactive) as PCP - General (General Practice) Macario Carls, MD as Referring Physician (Specialist) Erroll Luna, MD as Consulting Physician (General Surgery) Nicholas Lose, MD as Consulting Physician (Hematology and Oncology) Kyung Rudd, MD as Consulting Physician (Radiation Oncology)  DIAGNOSIS:    ICD-10-CM   1. Malignant neoplasm of upper-outer quadrant of right breast in female, estrogen receptor positive ([place])  C50.411    Z17.0       SUMMARY OF ONCOLOGIC HISTORY: Oncology History  Malignant neoplasm of upper-outer quadrant of right breast in female, estrogen receptor positive ([place])  11/11/2017 Initial Diagnosis   Palpable right breast mass at 10 o'clock position 1.7 cm, axilla negative, biopsy revealed grade 3 IDC ER 60%, PR 10%, Ki-67 70%, HER-2 positive, T1 CN 0 stage Ia   11/19/2017 Cancer Staging   Staging form: Breast, AJCC 8th Edition - Clinical: Stage IB (cT2, cN0, cM0, G3, ER+, PR+, HER2+) - Signed by Nicholas Lose, MD on 12/04/2017   11/25/2017 Breast MRI   4.4 x 2.8 x 2.1 cm right breast malignancy UOQ, second focus 0.8 cm (biopsy planned for 12/09/2017)   12/04/2017 - 03/26/2018 Neo-Adjuvant Chemotherapy   TCH Perjeta every 3 week x6 followed by Herceptin Perjeta maintenance   01/04/2018 Genetic Testing   Negative genetic testing on the multi-cancer panel.  The Multi-Gene Panel offered by [facility] includes sequencing and/or deletion duplication testing of the following 84 genes: AIP, ALK, APC, ATM, AXIN2,BAP1,  BARD1, BLM, BMPR1A, BRCA1, BRCA2, BRIP1, CASR, CDC73, CDH1, CDK4, CDKN1B, CDKN1C, CDKN2A (p14ARF), CDKN2A (p16INK4a), CEBPA, CHEK2, CTNNA1, DICER1, DIS3L2, EGFR (c.2369C>T, p.Thr790Met variant only), EPCAM (Deletion/duplication testing only), FH, FLCN, GATA2, GPC3, GREM1 (Promoter region deletion/duplication testing only), HOXB13 (c.251G>A, p.Gly84Glu), HRAS, KIT, MAX, MEN1, MET, MITF (c.952G>A, p.Glu318Lys variant only),  MLH1, MSH2, MSH3, MSH6, MUTYH, NBN, NF1, NF2, NTHL1, PALB2, PDGFRA, PHOX2B, PMS2, POLD1, POLE, POT1, PRKAR1A, PTCH1, PTEN, RAD50, RAD51C, RAD51D, RB1, RECQL4, RET, RUNX1, SDHAF2, SDHA (sequence changes only), SDHB, SDHC, SDHD, SMAD4, SMARCA4, SMARCB1, SMARCE1, STK11, SUFU, TERC, TERT, TMEM127, TP53, TSC1, TSC2, VHL, WRN and WT1.  The report date is January 04, 2018.   04/23/2018 Surgery   Right lumpectomy ([place]) [id number]): Small residual invasive cancer multifocal 0.15 cm, grade 2, 0/3 lymph nodes all negative, ER 90%, PR 60%, HER-2 +3+ by IHC, Ki-67 20%, RCB class I, ympT1a, ypN0   05/07/2018 - 12/03/2018 Chemotherapy   The patient had ado-trastuzumab emtansine (KADCYLA) 300 mg in sodium chloride 0.9 % 250 mL chemo infusion, 3.6 mg/kg = 300 mg, Intravenous, Once, 11 of 11 cycles Dose modification: 2.4 mg/kg (original dose 3.6 mg/kg, Cycle 8, Reason: Dose not tolerated) Administration: 300 mg (05/07/2018), 300 mg (05/28/2018), 300 mg (07/30/2018), 300 mg (08/20/2018), 300 mg (09/10/2018), 200 mg (10/01/2018), 200 mg (10/22/2018), 200 mg (11/12/2018), 200 mg (12/03/2018), 300 mg (06/18/2018), 300 mg (07/09/2018)   for chemotherapy treatment.     06/01/2018 - 07/15/2018 Radiation Therapy   The patient initially received a dose of 50.4 Gy in 28 fractions to the right breast using whole-breast tangent fields. This was delivered using a 3-D conformal technique. The patient then received a boost to the seroma. This delivered an additional 10 Gy in 5 fractions using 6X photons with a Complex Isodose technique. The total dose was 60.4 Gy.   12/2018 - 12/2023 Anti-estrogen oral therapy   Anastrozole     CHIEF COMPLIANT: Follow-up of right breast cancer on letrozole therapy  INTERVAL HISTORY: Sandra [name] is a 55  y.o. with above-mentioned history of HER-2 positive right breast cancer treated with neoadjuvant chemotherapy with TCH Perjeta, right lumpectomy, Kady[place]a maintenance, and who discontinued antiestrogen therapy  with anastrozole due to severe fatigue and joint pain. She is currently on antiestrogen therapy with letrozole. She presents to the clinic today today for follow-up.  She denies any lumps or nodules in the breast.  Other than fatigue she is tolerating letrozole fairly well at half the dosage.     ALLERGIES:  has No Known Allergies.  MEDICATIONS:  Current Outpatient Medications  Medication Sig Dispense Refill   betamethasone valerate ointment (VALISONE) 0.1 % Apply bid for 1-2 weeks as needed for flare of lichen sclerosis. 15 g 1   letrozole (FEMARA) 2.5 MG tablet TAKE 0.5 TABLETS (1.25 MG TOTAL) BY MOUTH DAILY. 90 tablet 1   Melatonin 10 MG TABS Take 10 mg by mouth.     vortioxetine HBr (TRINTELLIX) 20 MG TABS tablet Take one tab daily 30 tablet 2   No current facility-administered medications for this visit.    PHYSICAL EXAMINATION: ECOG PERFORMANCE STATUS: 1 - Symptomatic but completely ambulatory  Vitals:   11/30/20 0847  BP: (!) 141/73  Pulse: 83  Resp: 18  Temp: (!) 97.5 F (36.4 C)  SpO2: 95%   Filed Weights   11/30/20 0847  Weight: 232 lb 8 oz (105.5 kg)    BREAST: No palpable masses or nodules in either right or left breasts. No palpable axillary supraclavicular or infraclavicular adenopathy no breast tenderness or nipple discharge. (exam performed in the presence of a chaperone)  LABORATORY DATA:  I have reviewed the data as listed CMP Latest Ref Rng & Units 12/22/2018 12/03/2018 11/12/2018  Glucose 70 - 99 mg/dL 88 82 83  BUN 6 - 20 mg/dL '14 17 17  ' Creatinine 0.44 - 1.00 mg/dL 0.99 0.89 0.93  Sodium 135 - 145 mmol/L 141 139 138  Potassium 3.5 - 5.1 mmol/L 4.3 3.8 3.8  Chloride 98 - 111 mmol/L 103 104 103  CO2 22 - 32 mmol/L '29 26 28  ' Calcium 8.9 - 10.3 mg/dL 9.6 9.4 9.6  Total Protein 6.5 - 8.1 g/dL 7.4 7.6 7.4  Total Bilirubin 0.3 - 1.2 mg/dL 0.4 0.3 0.3  Alkaline Phos 38 - 126 U/L 68 68 71  AST 15 - 41 U/L 38 29 27  ALT 0 - 44 U/L 38 26 27    Lab Results   Component Value Date   WBC 3.4 (L) 12/22/2018   HGB 12.2 12/22/2018   HCT 36.3 12/22/2018   MCV 90.8 12/22/2018   PLT 121 (L) 12/22/2018   NEUTROABS 1.7 12/22/2018    ASSESSMENT & PLAN:  Malignant neoplasm of upper-outer quadrant of right breast in female, estrogen receptor positive ([place]) 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%, PR 10%, Ki-67 70%, HER-2 positive, T1 CN 0 stage Ia Breast MRI 11/25/2017: 4.4 x 2.8 x 2.1 cm right breast malignancy UOQ, second focus 0.8 cm (biopsy planned for 12/09/2017), overall extent of disease is 7 cm 12/12/2017:Biopsy results of the MRI guided biopsies 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  Neoadjuvant chemotherapy with [facility] Perjeta 12/04/2017-03/26/2018 followed by Kadcyla x 1 year completed 12/03/2018 2. breast conserving surgery with sentinel lymph node biopsy: 04/23/2018 3. Radiation therapy: Completed 07/15/2018 4.  Adjuvant antiestrogen therapy started 12/15/2018 -------------------------------------------------------------------------------------------------------------------------------- 04/23/2018:Right lumpectomy: Small residual invasive cancer multifocal 0.15 cm, grade  2, 0/3 lymph nodes all negative, ER 90%, PR 60%, HER-2 +3+ by IHC, Ki-67 20%, RCB class I, ympT1a, ypN0   Echo 03/11/2018: EF 60-65%   Current treatment: Anastrozole 1 mg daily stopped 09/21/2019 because of severe fatigue and severe joint pains, switched to letrozole half tablet daily started 11/19/2019  Letrozole toxicities: Muscle aches and pains have disappeared at half a tablet daily Continues to suffer from fatigue issues.   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 Weight gain issues: We discussed weight loss medication is an option for her because she has no time for exercise as.  We talked about Tirzapatide as an option.  Return to clinic in 1 year for follow-up    No orders of the defined types were placed in this encounter.  The patient has a good understanding of the overall plan. she agrees with it. she will call with any problems that may develop before the next visit here.  Total time spent: 20 mins including face to face time and time spent for planning, charting and coordination of care  Rulon Eisenmenger, MD, MPH 11/30/2020  I, Thana Ates, am acting as scribe for Dr. Nicholas Lose.  I have reviewed the above documentation for accuracy and completeness, and I agree with the above.

## 2020-11-30 ENCOUNTER — Inpatient Hospital Stay: Payer: BC Managed Care – PPO | Attending: Hematology and Oncology | Admitting: Hematology and Oncology

## 2020-11-30 ENCOUNTER — Other Ambulatory Visit: Payer: Self-pay

## 2020-11-30 ENCOUNTER — Encounter: Payer: Self-pay | Admitting: *Deleted

## 2020-11-30 VITALS — BP 141/73 | HR 83 | Temp 97.5°F | Resp 18 | Ht 69.0 in | Wt 232.5 lb

## 2020-11-30 DIAGNOSIS — C50411 Malignant neoplasm of upper-outer quadrant of right female breast: Secondary | ICD-10-CM

## 2020-11-30 DIAGNOSIS — Z170 Estrogen receptor positive status [ER+]: Secondary | ICD-10-CM | POA: Diagnosis not present

## 2020-11-30 DIAGNOSIS — Z9221 Personal history of antineoplastic chemotherapy: Secondary | ICD-10-CM | POA: Insufficient documentation

## 2020-11-30 DIAGNOSIS — Z780 Asymptomatic menopausal state: Secondary | ICD-10-CM

## 2020-11-30 DIAGNOSIS — Z923 Personal history of irradiation: Secondary | ICD-10-CM | POA: Insufficient documentation

## 2020-11-30 NOTE — Assessment & Plan Note (Signed)
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  Treatment plan: 1.Neoadjuvant chemotherapy with [place] Perjeta 12/04/2017-12/26/2019followed by Kadcyla x 1 yearcompleted 12/03/2018 2.breast conserving surgery with sentinel lymph node biopsy: 04/23/2018 3.Radiation therapy:Completed4/15/2020 4.Adjuvant antiestrogen therapystarted 12/15/2018 -------------------------------------------------------------------------------------------------------------------------------- 04/23/2018:Right lumpectomy: Small residual invasive cancer multifocal 0.15 cm, grade 2, 0/3 lymph nodes all negative, ER 90%, PR 60%, HER-2 +3+ by IHC, Ki-67 20%, RCB class I, ympT1a, ypN0  Echo 03/11/2018: EF 60-65%  Current treatment:Anastrozole 1 mg daily stopped 6/22/2021because of severe fatigue and severe joint pains, switched to letrozole half tablet daily started 11/19/2019  Letrozole toxicities: Muscle aches and pains have disappeared at half a tablet daily Continues to suffer from fatigue issues.  Breast cancer surveillance: Mammogram 04/21/2020: No evidence of malignancy breast density category B Bone density January 2021: T score -0.3: Normal Breast exam: 11/30/2020: Benign  Return to clinic in 1 year for follow-up

## 2020-11-30 NOTE — Research (Signed)
[id number] Upbeat 3 Years:  Patient into clinic by herself this morning to see Dr. Lindi Adie for routine follow up visit.  Met with patient briefly to request she complete the PROs for the 3 year time point on the Upbeat Study.  Informed patient the questionnaire is available to be completed online or she can complete now in clinic.  Patient opted to complete questionnaire in clinic. [id number] Questionnaire: Patient completed and research nurse collected and checked for completeness and accuracy.   Cardiovascular Events: Patient denies any hospitalizations or cardiovascular events since last study visit.  Plan: Thanked patient for her ongoing participation in this study. Informed patient she will be asked to complete this questionnaire yearly for the next 8 years. She will receive phone calls from our research department every year to see if she wants to complete online, in person or by phone. She verbalized understanding.  Foye Spurling, BSN, RN Clinical Research Nurse 11/30/2020

## 2020-12-05 ENCOUNTER — Other Ambulatory Visit: Payer: Self-pay | Admitting: Adult Health

## 2020-12-05 DIAGNOSIS — Z9889 Other specified postprocedural states: Secondary | ICD-10-CM

## 2020-12-20 DIAGNOSIS — F331 Major depressive disorder, recurrent, moderate: Secondary | ICD-10-CM | POA: Diagnosis not present

## 2020-12-25 DIAGNOSIS — G4733 Obstructive sleep apnea (adult) (pediatric): Secondary | ICD-10-CM | POA: Diagnosis not present

## 2020-12-30 ENCOUNTER — Other Ambulatory Visit: Payer: Self-pay | Admitting: Hematology and Oncology

## 2021-01-14 ENCOUNTER — Other Ambulatory Visit: Payer: Self-pay | Admitting: Obstetrics and Gynecology

## 2021-01-15 ENCOUNTER — Other Ambulatory Visit: Payer: Self-pay

## 2021-01-15 ENCOUNTER — Encounter (HOSPITAL_COMMUNITY): Payer: Self-pay | Admitting: Psychiatry

## 2021-01-15 ENCOUNTER — Telehealth (HOSPITAL_BASED_OUTPATIENT_CLINIC_OR_DEPARTMENT_OTHER): Payer: BC Managed Care – PPO | Admitting: Psychiatry

## 2021-01-15 DIAGNOSIS — F330 Major depressive disorder, recurrent, mild: Secondary | ICD-10-CM | POA: Diagnosis not present

## 2021-01-15 DIAGNOSIS — F419 Anxiety disorder, unspecified: Secondary | ICD-10-CM | POA: Diagnosis not present

## 2021-01-15 MED ORDER — VORTIOXETINE HBR 20 MG PO TABS: ORAL_TABLET | ORAL | 2 refills | Status: DC

## 2021-01-15 NOTE — Telephone Encounter (Signed)
Annual exam scheduled on 02/15/21

## 2021-01-15 NOTE — Progress Notes (Signed)
Virtual Visit via Telephone Note  I connected with Sandra [name] on 01/15/21 at  3:20 PM EDT by telephone and verified that I am speaking with the correct person using two identifiers.  Location: Patient: In Car Provider: Home Office   I discussed the limitations, risks, security and privacy concerns of performing an evaluation and management service by telephone and the availability of in person appointments. I also discussed with the patient that there may be a patient responsible charge related to this service. The patient expressed understanding and agreed to proceed.   History of Present Illness: Patient is evaluated by phone session.  She is in the car.  She reported Trintellix is helping her depression and anxiety and denies any recent relapsing symptoms.  Her energy level is good.  She sleeps 7 to 8 hours without any issues.  Recently she had a visit with her oncologist and no medicines were changed but she is anxious about her weight gain.  She admitted eating junk food and not watching her calorie intake but now decided to have a physical and blood work and go to the local PCP.  She did not have a sign PCP but now she is looking for it.  She had a good report from her oncologist.  Lately she has been busy touring colleges because her daughter is a Equities trader and applying.  She is not sure where she will go.  Overall she feels things are going okay as she denies any major panic attack, crying spells or any suicidal thoughts.  Her job is going well.  She has no tremors, shakes or any EPS.  She would like to keep the Trintellix.  She has not taken melatonin in a while since her sleep is better.     Past Psychiatric History:  [facility]  Tried Paxil, Lexapro, ativan and trazadone but don't remember.  Lamictal did not help and Wellbutrin caused depression.  We tried Effexor and Prozac but did not help.  No h/o psychosis, suicidal attempt or Inpatient.     Psychiatric Specialty Exam: Physical Exam  Review of Systems  Weight 232 lb (105.2 kg).There is no height or weight on file to calculate BMI.  General Appearance: NA  Eye Contact:  NA  Speech:  Clear and Coherent and Normal Rate  Volume:  Normal  Mood:  Euthymic  Affect:  NA  Thought Process:  Goal Directed  Orientation:  Full (Time, Place, and Person)  Thought Content:  Logical  Suicidal Thoughts:  No  Homicidal Thoughts:  No  Memory:  Immediate;   Good Recent;   Good Remote;   Good  Judgement:  Good  Insight:  Present  Psychomotor Activity:  NA  Concentration:  Concentration: Good and Attention Span: Good  Recall:  Good  Fund of Knowledge:  Good  Language:  Good  Akathisia:  No  Handed:  Right  AIMS (if indicated):     Assets:  Communication Skills Desire for Improvement Housing Resilience Talents/Skills Transportation  ADL's:  Intact  Cognition:  WNL  Sleep:   8 hrs      Assessment and Plan: Major depressive disorder, recurrent.  Anxiety.  Patient is stable on Trintellix 10 mg.  Since she had finished the [place] few months ago her symptoms are under control and does not need to go back to do maintenance TMS.  We talked about weight gain and patient agreed that she needs to had a visit with the PCP  and blood work.  Discussed medication side effects and benefits.  She is in therapy with Cyril Mourning and that is going well.  Recommended to call us back if there is any question or any concern.  Follow-up in 3 months.   Follow Up Instructions:    I discussed the assessment and treatment plan with the patient. The patient was provided an opportunity to ask questions and all were answered. The patient agreed with the plan and demonstrated an understanding of the instructions.   The patient was advised to call back or seek an in-person evaluation if the symptoms worsen or if the condition fails to improve as anticipated.  I provided 18 minutes of non-face-to-face time  during this encounter.   Kathlee Nations, MD

## 2021-02-15 ENCOUNTER — Ambulatory Visit: Payer: BC Managed Care – PPO | Admitting: Obstetrics and Gynecology

## 2021-02-19 ENCOUNTER — Encounter: Payer: Self-pay | Admitting: Physician Assistant

## 2021-02-19 ENCOUNTER — Other Ambulatory Visit: Payer: Self-pay

## 2021-02-19 ENCOUNTER — Ambulatory Visit: Payer: BC Managed Care – PPO | Admitting: Physician Assistant

## 2021-02-19 VITALS — BP 132/84 | HR 82 | Temp 98.2°F | Ht 69.0 in | Wt 239.2 lb

## 2021-02-19 DIAGNOSIS — R21 Rash and other nonspecific skin eruption: Secondary | ICD-10-CM

## 2021-02-19 DIAGNOSIS — Z170 Estrogen receptor positive status [ER+]: Secondary | ICD-10-CM

## 2021-02-19 DIAGNOSIS — E669 Obesity, unspecified: Secondary | ICD-10-CM

## 2021-02-19 DIAGNOSIS — F339 Major depressive disorder, recurrent, unspecified: Secondary | ICD-10-CM

## 2021-02-19 DIAGNOSIS — C50411 Malignant neoplasm of upper-outer quadrant of right female breast: Secondary | ICD-10-CM

## 2021-02-19 DIAGNOSIS — L304 Erythema intertrigo: Secondary | ICD-10-CM

## 2021-02-19 DIAGNOSIS — F520 Hypoactive sexual desire disorder: Secondary | ICD-10-CM | POA: Diagnosis not present

## 2021-02-19 MED ORDER — TRIAMCINOLONE ACETONIDE 0.1 % EX CREA: 1.0000 "application " | TOPICAL_CREAM | Freq: Two times a day (BID) | CUTANEOUS | 0 refills | Status: DC

## 2021-02-19 MED ORDER — CLOTRIMAZOLE 1 % EX CREA: 1.0000 "application " | TOPICAL_CREAM | Freq: Two times a day (BID) | CUTANEOUS | 0 refills | Status: AC

## 2021-02-19 NOTE — Progress Notes (Signed)
Subjective:    Patient ID: Sandra [name], female    DOB: Aug 27, 1965, 55 y.o.   MRN: [id number]  Chief Complaint  Patient presents with   Establish Care    HPI 55 y.o. patient presents today for new patient establishment with me.  Patient was previously established with Dr. Briscoe Deutscher.  Current Care Team: GYN, Dr. Quincy Simmonds - next appt in January  Oncologist / surgeon- annually, personal hx breast cancer Psychiatrist, Dr. Adele Schilder - every 3 months   Counselor - but it has been a few months  Acute Concerns: Weight concerns - says this is the biggest she has ever been. Would like help reaching weight loss goals.  Right axillary skin tags and itching. Left elbow flexor surface rash & itching. No prior skin issues.   Lack of sex drive. GYN has told her to use olive oil over the last few years. Also has lichen sclerosis and uses betamethasone.   Muscle cramps diffusely intermittently. Would like labs checked.   Chronic Concerns: See PMH listed below, as well as A/P for details on issues we specifically discussed during today's visit.      Past Medical History:  Diagnosis Date   Breast cancer ([place]) 11/2017   rt.Br.   Depression    Endometriosis, diagnosis via laparoscopy    Family history of breast cancer    Family history of breast cancer    Family history of colon cancer    Family history of prostate cancer    Hyperlipidemia 3335   Lichen sclerosus, vulva    Low serum vitamin D 2017   Personal history of chemotherapy    Personal history of radiation therapy     Past Surgical History:  Procedure Laterality Date   APPENDECTOMY  2005   BREAST BIOPSY Right 11/11/2017   BREAST BIOPSY Right 12/12/2017   x2   BREAST LUMPECTOMY Right 04/23/2018   BREAST LUMPECTOMY WITH RADIOACTIVE SEED AND SENTINEL LYMPH NODE BIOPSY Right 04/23/2018   Procedure: RIGHT BREAST WITH RADIOACTIVE SEED LUMPECTOMY x3 AND SENTINEL LYMPH NODE MAPPING;  Surgeon: Erroll Luna, MD;  Location: [place];  Service: General;  Laterality: Right;   [place]   wires inside jaw according to pt   PELVIC LAPAROSCOPY  2004   PORT-A-CATH REMOVAL Right 01/27/2019   Procedure: PORT REMOVAL;  Surgeon: Erroll Luna, MD;  Location: [facility];  Service: General;  Laterality: Right;   PORTACATH PLACEMENT Right 12/03/2017   Procedure: INSERTION PORT-A-CATH;  Surgeon: Erroll Luna, MD;  Location: [place];  Service: General;  Laterality: Right;   [place]    Family History  Problem Relation Age of Onset   Stroke Mother    Heart disease Mother    Colon cancer Father    Prostate cancer Father    Hypertension Brother    Cancer Maternal Uncle        NOS   Breast cancer Paternal Aunt        dx in her 71s   Prostate cancer Paternal Uncle    Alcohol abuse Maternal Grandfather    Breast cancer Paternal Grandmother        dx in her 55s   Prostate cancer Paternal Grandfather    SIDS Brother    Cancer Paternal Aunt    Tuberculosis Paternal Uncle     Social History   Tobacco Use   Smoking status: Never   Smokeless tobacco:  Never  Vaping Use   Vaping Use: Never used  Substance Use Topics   Alcohol use: No    Alcohol/week: 0.0 standard drinks   Drug use: No     No Known Allergies  Review of Systems NEGATIVE UNLESS OTHERWISE INDICATED IN HPI      Objective:     BP 132/84   Pulse 82   Temp 98.2 F (36.8 C)   Ht 5\' 9"  (1.753 m)   Wt 239 lb 3.2 oz (108.5 kg)   SpO2 99%   BMI 35.32 kg/m   Wt Readings from Last 3 Encounters:  02/19/21 239 lb 3.2 oz (108.5 kg)  11/30/20 232 lb 8 oz (105.5 kg)  02/15/20 225 lb (102.1 kg)    BP Readings from Last 3 Encounters:  02/19/21 132/84  11/30/20 (!) 141/73  02/15/20 130/72     Physical Exam Vitals and nursing note reviewed.  Constitutional:      Appearance: Normal appearance. She is obese. She is not toxic-appearing.  HENT:     Head:  Normocephalic and atraumatic.     Right Ear: Tympanic membrane, ear canal and external ear normal.     Left Ear: Tympanic membrane, ear canal and external ear normal.     Nose: Nose normal.     Mouth/Throat:     Mouth: Mucous membranes are moist.  Eyes:     Extraocular Movements: Extraocular movements intact.     Conjunctiva/sclera: Conjunctivae normal.     Pupils: Pupils are equal, round, and reactive to light.  Cardiovascular:     Rate and Rhythm: Normal rate and regular rhythm.     Pulses: Normal pulses.     Heart sounds: Normal heart sounds.  Pulmonary:     Effort: Pulmonary effort is normal.     Breath sounds: Normal breath sounds.  Abdominal:     General: Abdomen is flat. Bowel sounds are normal.     Palpations: Abdomen is soft.  Musculoskeletal:        General: Normal range of motion.     Cervical back: Normal range of motion and neck supple.  Skin:    General: Skin is warm and dry.       Neurological:     General: No focal deficit present.     Mental Status: She is alert and oriented to person, place, and time.  Psychiatric:        Mood and Affect: Mood normal.        Behavior: Behavior normal.        Thought Content: Thought content normal.        Judgment: Judgment normal.       Assessment & Plan:   Problem List Items Addressed This Visit       Other   Lack of libido   Depression, recurrent (HCC)   Obesity (BMI 30-39.9)   Malignant neoplasm of upper-outer quadrant of right breast in female, estrogen receptor positive ([place])   Other Visit Diagnoses     Rash and nonspecific skin eruption    -  Primary   Intertrigo            Meds ordered this encounter  Medications   clotrimazole (LOTRIMIN) 1 % cream    Sig: Apply 1 application topically 2 (two) times daily.    Dispense:  45 g    Refill:  0   triamcinolone cream (KENALOG) 0.1 %    Sig: Apply 1 application topically 2 (two) times daily for 14  days.    Dispense:  45 g    Refill:  0   1. Rash  and nonspecific skin eruption Left flexor surface of elbow - ?Eczema. Will trial triamcinolone cream BID x 14 days. Moisturize. Consider derm if worse or no imp.  2. Intertrigo R axilla - clotrimazole cream BID. Try moisture wicking pads and shirts to help keep the area dry.  3. Depression, recurrent (HCC) Stable on Trintellix 20 mg daily. Sees psych & counseling.  [place] Office Visit from 02/19/2021 in [place]  PHQ-9 Total Score 10      4. Lack of libido Most likely 2/2 #3. Encouraged her to discuss further with GYN.   5. Obesity (BMI 30-39.9) Will discuss with her more as we continue to establish patient-provider relationship. Also offered referral to [facility].   6. Malignant neoplasm of upper-outer quadrant of right breast in female, estrogen receptor positive ([place]) She is following with oncology annually.    Haze Antillon M Trevante Tennell, PA-C

## 2021-02-26 ENCOUNTER — Other Ambulatory Visit (HOSPITAL_COMMUNITY)
Admission: RE | Admit: 2021-02-26 | Discharge: 2021-02-26 | Disposition: A | Discharge status: Home / Self Care | Payer: BC Managed Care – PPO | Source: Ambulatory Visit | Attending: Physician Assistant | Admitting: Physician Assistant

## 2021-02-26 ENCOUNTER — Other Ambulatory Visit: Payer: Self-pay

## 2021-02-26 ENCOUNTER — Encounter: Payer: Self-pay | Admitting: Physician Assistant

## 2021-02-26 ENCOUNTER — Ambulatory Visit (INDEPENDENT_AMBULATORY_CARE_PROVIDER_SITE_OTHER): Payer: BC Managed Care – PPO | Admitting: Physician Assistant

## 2021-02-26 VITALS — BP 106/70 | HR 79 | Temp 98.0°F | Ht 69.0 in | Wt 236.0 lb

## 2021-02-26 DIAGNOSIS — Z131 Encounter for screening for diabetes mellitus: Secondary | ICD-10-CM | POA: Diagnosis not present

## 2021-02-26 DIAGNOSIS — Z13220 Encounter for screening for lipoid disorders: Secondary | ICD-10-CM | POA: Diagnosis not present

## 2021-02-26 DIAGNOSIS — R5383 Other fatigue: Secondary | ICD-10-CM

## 2021-02-26 DIAGNOSIS — Z1329 Encounter for screening for other suspected endocrine disorder: Secondary | ICD-10-CM

## 2021-02-26 DIAGNOSIS — Z0000 Encounter for general adult medical examination without abnormal findings: Secondary | ICD-10-CM | POA: Diagnosis not present

## 2021-02-26 DIAGNOSIS — Z23 Encounter for immunization: Secondary | ICD-10-CM

## 2021-02-26 LAB — LIPID PANEL
Cholesterol: 284 mg/dL — ABNORMAL HIGH (ref 0–200)
HDL: 55.8 mg/dL (ref >39.00)
LDL Cholesterol: 189 mg/dL — ABNORMAL HIGH (ref 0–99)
NonHDL: 228.48
Total CHOL/HDL Ratio: 5
Triglycerides: 195 mg/dL — ABNORMAL HIGH (ref 0.0–149.0)
VLDL: 39 mg/dL (ref 0.0–40.0)

## 2021-02-26 LAB — VITAMIN D 25 HYDROXY (VIT D DEFICIENCY, FRACTURES): VITD: 26.43 ng/mL — ABNORMAL LOW (ref 30.00–100.00)

## 2021-02-26 LAB — COMPREHENSIVE METABOLIC PANEL
ALT: 22 U/L (ref 0–35)
AST: 17 U/L (ref 0–37)
Albumin: 4.4 g/dL (ref 3.5–5.2)
Alkaline Phosphatase: 58 U/L (ref 39–117)
BUN: 16 mg/dL (ref 6–23)
CO2: 26 mEq/L (ref 19–32)
Calcium: 10 mg/dL (ref 8.4–10.5)
Chloride: 103 mEq/L (ref 96–112)
Creatinine, Ser: 0.97 mg/dL (ref 0.40–1.20)
GFR: 66.2 mL/min (ref >60.00)
Glucose, Bld: 96 mg/dL (ref 70–99)
Potassium: 4.3 mEq/L (ref 3.5–5.1)
Sodium: 139 mEq/L (ref 135–145)
Total Bilirubin: 0.5 mg/dL (ref 0.2–1.2)
Total Protein: 7.3 g/dL (ref 6.0–8.3)

## 2021-02-26 LAB — CBC WITH DIFFERENTIAL/PLATELET
Basophils Absolute: 0 10*3/uL (ref 0.0–0.1)
Basophils Relative: 0.5 % (ref 0.0–3.0)
Eosinophils Absolute: 0.5 10*3/uL (ref 0.0–0.7)
Eosinophils Relative: 10.3 % — ABNORMAL HIGH (ref 0.0–5.0)
HCT: 38.7 % (ref 36.0–46.0)
Hemoglobin: 13.1 g/dL (ref 12.0–15.0)
Lymphocytes Relative: 26.1 % (ref 12.0–46.0)
Lymphs Abs: 1.1 10*3/uL (ref 0.7–4.0)
MCHC: 33.7 g/dL (ref 30.0–36.0)
MCV: 91.2 fl (ref 78.0–100.0)
Monocytes Absolute: 0.4 10*3/uL (ref 0.1–1.0)
Monocytes Relative: 8.7 % (ref 3.0–12.0)
Neutro Abs: 2.4 10*3/uL (ref 1.4–7.7)
Neutrophils Relative %: 54.4 % (ref 43.0–77.0)
Platelets: 256 10*3/uL (ref 150.0–400.0)
RBC: 4.24 Mil/uL (ref 3.87–5.11)
RDW: 14.4 % (ref 11.5–15.5)
WBC: 4.4 10*3/uL (ref 4.0–10.5)

## 2021-02-26 LAB — TSH: TSH: 1.36 u[IU]/mL (ref 0.35–5.50)

## 2021-02-26 LAB — VITAMIN B12: Vitamin B-12: 287 pg/mL (ref 211–911)

## 2021-02-26 NOTE — Patient Instructions (Addendum)
Good to see you today! Please go to the lab for blood work and I will send results through [place]. Continue to work on increasing physical activity - even walking 20 minutes daily is great for the body and mind!   Happy holidays to you!

## 2021-02-26 NOTE — Progress Notes (Signed)
Subjective:    Patient ID: Sandra [name], female    DOB: 04-Aug-1965, 55 y.o.   MRN: [id number]  Chief Complaint  Patient presents with   Annual Exam     HPI Patient is in today for annual physical.  Acute concerns: No acute concerns today. Rashes from last week's visit are improving!   Health maintenance: Lifestyle/ exercise: Sedentary. She has goals to start working on this. Nutrition: Could be better Mental health: Sees psych and counselor regularly Substance use: None Sexual activity: Monogamous, no libido, discussed at last visit. Immunizations: Flu shot today Colonoscopy: Next due in 2027 Pap: Today Mammogram: January 2023 DEXA: January 2023    Past Medical History:  Diagnosis Date   Breast cancer ([place]) 11/2017   rt.Br.   Depression    Endometriosis, diagnosis via laparoscopy    Family history of breast cancer    Family history of breast cancer    Family history of colon cancer    Family history of prostate cancer    Hyperlipidemia 2951   Lichen sclerosus, vulva    Low serum vitamin D 2017   Personal history of chemotherapy    Personal history of radiation therapy     Past Surgical History:  Procedure Laterality Date   APPENDECTOMY  2005   BREAST BIOPSY Right 11/11/2017   BREAST BIOPSY Right 12/12/2017   x2   BREAST LUMPECTOMY Right 04/23/2018   BREAST LUMPECTOMY WITH RADIOACTIVE SEED AND SENTINEL LYMPH NODE BIOPSY Right 04/23/2018   Procedure: RIGHT BREAST WITH RADIOACTIVE SEED LUMPECTOMY x3 AND SENTINEL LYMPH NODE MAPPING;  Surgeon: Erroll Luna, MD;  Location: [facility];  Service: General;  Laterality: Right;   CESAREAN SECTION  2002   [place]   wires inside jaw according to pt   PELVIC LAPAROSCOPY  2004   PORT-A-CATH REMOVAL Right 01/27/2019   Procedure: PORT REMOVAL;  Surgeon: Erroll Luna, MD;  Location: [facility];  Service: General;  Laterality: Right;   PORTACATH PLACEMENT Right 12/03/2017    Procedure: INSERTION PORT-A-CATH;  Surgeon: Erroll Luna, MD;  Location: [facility];  Service: General;  Laterality: Right;   [place]    Family History  Problem Relation Age of Onset   Stroke Mother    Heart disease Mother    Colon cancer Father    Prostate cancer Father    Hypertension Brother    Cancer Maternal Uncle        NOS   Breast cancer Paternal Aunt        dx in her 27s   Prostate cancer Paternal Uncle    Alcohol abuse Maternal Grandfather    Breast cancer Paternal Grandmother        dx in her 24s   Prostate cancer Paternal Grandfather    SIDS Brother    Cancer Paternal Aunt    Tuberculosis Paternal Uncle     Social History   Tobacco Use   Smoking status: Never   Smokeless tobacco: Never  Vaping Use   Vaping Use: Never used  Substance Use Topics   Alcohol use: No    Alcohol/week: 0.0 standard drinks   Drug use: No     No Known Allergies  Review of Systems NEGATIVE UNLESS OTHERWISE INDICATED IN HPI      Objective:     BP 106/70 (BP Location: Left Arm, Patient Position: Sitting, Cuff Size: Large)   Pulse 79   Temp 98 F (36.7 C) (Temporal)  Ht 5\' 9"  (1.753 m)   Wt 236 lb (107 kg)   SpO2 97%   BMI 34.85 kg/m   Wt Readings from Last 3 Encounters:  02/26/21 236 lb (107 kg)  02/19/21 239 lb 3.2 oz (108.5 kg)  11/30/20 232 lb 8 oz (105.5 kg)    BP Readings from Last 3 Encounters:  02/26/21 106/70  02/19/21 132/84  11/30/20 (!) 141/73     Physical Exam Vitals and nursing note reviewed. Exam conducted with a chaperone present.  Constitutional:      Appearance: Normal appearance. She is normal weight. She is not toxic-appearing.  HENT:     Head: Normocephalic and atraumatic.     Right Ear: Tympanic membrane, ear canal and external ear normal.     Left Ear: Tympanic membrane, ear canal and external ear normal.     Nose: Nose normal.     Mouth/Throat:     Mouth: Mucous membranes are moist.  Eyes:      Extraocular Movements: Extraocular movements intact.     Conjunctiva/sclera: Conjunctivae normal.     Pupils: Pupils are equal, round, and reactive to light.  Cardiovascular:     Rate and Rhythm: Normal rate and regular rhythm.     Pulses: Normal pulses.     Heart sounds: Normal heart sounds.  Pulmonary:     Effort: Pulmonary effort is normal.     Breath sounds: Normal breath sounds.  Abdominal:     General: Abdomen is flat. Bowel sounds are normal.     Palpations: Abdomen is soft.  Genitourinary:    Vagina: Normal.     Cervix: Normal.     Uterus: Normal.      Adnexa: Right adnexa normal and left adnexa normal.     Comments: Lichen erythema present Musculoskeletal:        General: Normal range of motion.     Cervical back: Normal range of motion and neck supple.  Skin:    General: Skin is warm and dry.     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     Mental Status: She is alert and oriented to person, place, and time.  Psychiatric:        Mood and Affect: Mood normal.        Behavior: Behavior normal.        Thought Content: Thought content normal.        Judgment: Judgment normal.       Assessment & Plan:   Problem List Items Addressed This Visit   None Visit Diagnoses     Encounter for annual physical exam    -  Primary   Relevant Orders   CBC with Differential/Platelet   Comprehensive metabolic panel   Lipid panel   Cytology - PAP   VITAMIN D 25 Hydroxy (Vit-D Deficiency, Fractures)   B12   TSH   Diabetes mellitus screening       Relevant Orders   Comprehensive metabolic panel   Screening for cholesterol level       Relevant Orders   Lipid panel   Screening for thyroid disorder       Relevant Orders   TSH   Other fatigue       Relevant Orders   VITAMIN D 25 Hydroxy (Vit-D Deficiency, Fractures)   B12   TSH   Need for influenza vaccination       Need for immunization against  influenza        Relevant Orders   Flu Vaccine QUAD 2mo+IM (Fluarix, Fluzone & Alfiuria Quad PF) (Completed)        1. Encounter for annual physical exam 2. Diabetes mellitus screening 3. Screening for cholesterol level 4. Screening for thyroid disorder 5. Other fatigue 6. Need for influenza vaccination Age-appropriate screening and counseling performed today. Will check labs and call with results. Pap smear performed in office. Preventive measures discussed and printed in AVS for patient. Flu shot updated. Encouraged her to work on increasing physical activity level. Annual dental / vision f/up.    Ventura Hollenbeck M Brigitt Mcclish, PA-C

## 2021-02-27 ENCOUNTER — Encounter: Payer: Self-pay | Admitting: Physician Assistant

## 2021-02-27 LAB — CYTOLOGY - PAP: Diagnosis: NEGATIVE

## 2021-04-16 ENCOUNTER — Encounter (INDEPENDENT_AMBULATORY_CARE_PROVIDER_SITE_OTHER): Payer: Self-pay

## 2021-04-16 ENCOUNTER — Other Ambulatory Visit: Payer: Self-pay

## 2021-04-16 ENCOUNTER — Encounter (HOSPITAL_COMMUNITY): Payer: Self-pay | Admitting: Psychiatry

## 2021-04-16 ENCOUNTER — Telehealth (HOSPITAL_BASED_OUTPATIENT_CLINIC_OR_DEPARTMENT_OTHER): Payer: BC Managed Care – PPO | Admitting: Psychiatry

## 2021-04-16 DIAGNOSIS — F419 Anxiety disorder, unspecified: Secondary | ICD-10-CM

## 2021-04-16 DIAGNOSIS — F330 Major depressive disorder, recurrent, mild: Secondary | ICD-10-CM

## 2021-04-16 MED ORDER — VORTIOXETINE HBR 20 MG PO TABS: ORAL_TABLET | ORAL | 2 refills | Status: DC

## 2021-04-16 NOTE — Progress Notes (Signed)
Virtual Visit via Telephone Note  I connected with Cleora Fleet Spiers on 04/16/21 at  3:20 PM EST by telephone and verified that I am speaking with the correct person using two identifiers.  Location: Patient: Home Provider: Office   I discussed the limitations, risks, security and privacy concerns of performing an evaluation and management service by telephone and the availability of in person appointments. I also discussed with the patient that there may be a patient responsible charge related to this service. The patient expressed understanding and agreed to proceed.   History of Present Illness: Patient is evaluated by phone session.  She is taking Trintellix every day and she noted that is helping her depression.  She is still struggling with motivation but realized she needs to do things to help her energy and motivation.  She has not started the gym yet.  She is not exercising.  She has upcoming appointment to see GI for colonoscopy and then later in the year she will see oncologist.  She is happy that her daughter chooses to go to [facility].  She is in therapy with Cyril Mourning but now thinking to switch to a couple therapist and realizes it will help to improve her communication.  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  Her job is going okay.  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  Patient denies any crying spells, feeling of hopelessness or worthlessness.  She denies any paranoia, panic attack.  She sleeps okay.  Past Psychiatric History:  [facility]  Tried Paxil, Lexapro, ativan and trazadone but don't remember.  Lamictal did not help and Wellbutrin caused depression.  We tried Effexor and Prozac but did not help.  No h/o psychosis, suicidal attempt or Inpatient.    Recent Results (from the past 2160 hour(s))  Cytology - PAP     Status: None   Collection Time: 02/26/21  7:51 AM  Result  Value Ref Range   Adequacy      Satisfactory for evaluation; transformation zone component PRESENT.   Diagnosis      - Negative for intraepithelial lesion or malignancy (NILM)  CBC with Differential/Platelet     Status: Abnormal   Collection Time: 02/26/21  8:08 AM  Result Value Ref Range   WBC 4.4 4.0 - 10.5 K/uL   RBC 4.24 3.87 - 5.11 Mil/uL   Hemoglobin 13.1 12.0 - 15.0 g/dL   HCT 38.7 36.0 - 46.0 %   MCV 91.2 78.0 - 100.0 fl   MCHC 33.7 30.0 - 36.0 g/dL   RDW 14.4 11.5 - 15.5 %   Platelets 256.0 150.0 - 400.0 K/uL   Neutrophils Relative % 54.4 43.0 - 77.0 %   Lymphocytes Relative 26.1 12.0 - 46.0 %   Monocytes Relative 8.7 3.0 - 12.0 %   Eosinophils Relative 10.3 (H) 0.0 - 5.0 %   Basophils Relative 0.5 0.0 - 3.0 %   Neutro Abs 2.4 1.4 - 7.7 K/uL   Lymphs Abs 1.1 0.7 - 4.0 K/uL   Monocytes Absolute 0.4 0.1 - 1.0 K/uL   Eosinophils Absolute 0.5 0.0 - 0.7 K/uL   Basophils Absolute 0.0 0.0 - 0.1 K/uL  Comprehensive metabolic panel     Status: None   Collection Time: 02/26/21  8:08 AM  Result Value Ref Range   Sodium 139 135 - 145 mEq/L   Potassium 4.3 3.5 - 5.1 mEq/L  Chloride 103 96 - 112 mEq/L   CO2 26 19 - 32 mEq/L   Glucose, Bld 96 70 - 99 mg/dL   BUN 16 6 - 23 mg/dL   Creatinine, Ser 0.97 0.40 - 1.20 mg/dL   Total Bilirubin 0.5 0.2 - 1.2 mg/dL   Alkaline Phosphatase 58 39 - 117 U/L   AST 17 0 - 37 U/L   ALT 22 0 - 35 U/L   Total Protein 7.3 6.0 - 8.3 g/dL   Albumin 4.4 3.5 - 5.2 g/dL   GFR 66.20 >60.00 mL/min    Comment: Calculated using the CKD-EPI Creatinine Equation (2021)   Calcium 10.0 8.4 - 10.5 mg/dL  Lipid panel     Status: Abnormal   Collection Time: 02/26/21  8:08 AM  Result Value Ref Range   Cholesterol 284 (H) 0 - 200 mg/dL    Comment: ATP III Classification       Desirable:  < 200 mg/dL               Borderline High:  200 - 239 mg/dL          High:  > = 240 mg/dL   Triglycerides 195.0 (H) 0.0 - 149.0 mg/dL    Comment: Normal:  <150  mg/dLBorderline High:  150 - 199 mg/dL   HDL 55.80 >39.00 mg/dL   VLDL 39.0 0.0 - 40.0 mg/dL   LDL Cholesterol 189 (H) 0 - 99 mg/dL   Total CHOL/HDL Ratio 5     Comment:                Men          Women1/2 Average Risk     3.4          3.3Average Risk          5.0          4.42X Average Risk          9.6          7.13X Average Risk          15.0          11.0                       NonHDL 228.48     Comment: NOTE:  Non-HDL goal should be 30 mg/dL higher than patient's LDL goal (i.e. LDL goal of < 70 mg/dL, would have non-HDL goal of < 100 mg/dL)  VITAMIN D 25 Hydroxy (Vit-D Deficiency, Fractures)     Status: Abnormal   Collection Time: 02/26/21  8:08 AM  Result Value Ref Range   VITD 26.43 (L) 30.00 - 100.00 ng/mL  B12     Status: None   Collection Time: 02/26/21  8:08 AM  Result Value Ref Range   Vitamin B-12 287 211 - 911 pg/mL  TSH     Status: None   Collection Time: 02/26/21  8:08 AM  Result Value Ref Range   TSH 1.36 0.35 - 5.50 uIU/mL     Psychiatric Specialty Exam: Physical Exam  Review of Systems  Weight 239 lb (108.4 kg).There is no height or weight on file to calculate BMI.  General Appearance: NA  Eye Contact:  Absent  Speech:  Clear and Coherent and Normal Rate  Volume:  Normal  Mood:  Euthymic  Affect:  NA  Thought Process:  Goal Directed  Orientation:  Full (Time, Place, and Person)  Thought Content:  WDL  Suicidal  Thoughts:  No  Homicidal Thoughts:  No  Memory:  Immediate;   Good Recent;   Good Remote;   Good  Judgement:  Intact  Insight:  Present  Psychomotor Activity:  NA  Concentration:  Concentration: Good and Attention Span: Good  Recall:  Good  Fund of Knowledge:  Good  Language:  Good  Akathisia:  No  Handed:  Right  AIMS (if indicated):     Assets:  Communication Skills Desire for Improvement Housing Social Support Transportation  ADL's:  Intact  Cognition:  WNL  Sleep:   ok       Assessment and Plan: Major depressive disorder,  recurrent.  Anxiety.  I reviewed blood work results.  Her CBC and chemistry is normal.  Continue Trintellix 10 mg daily.  She is thinking to switch her therapist to more into family/couple therapy.  She like to go back to gym and exercise.  She does not want to change the medication.  We will continue Trintellix 10 mg daily.  Recommended to call us back if she is any question or any concern.  Follow-up in 3 months.  Follow Up Instructions:    I discussed the assessment and treatment plan with the patient. The patient was provided an opportunity to ask questions and all were answered. The patient agreed with the plan and demonstrated an understanding of the instructions.   The patient was advised to call back or seek an in-person evaluation if the symptoms worsen or if the condition fails to improve as anticipated.  I provided 18 minutes of non-face-to-face time during this encounter.   Kathlee Nations, MD

## 2021-05-01 ENCOUNTER — Ambulatory Visit: Payer: BC Managed Care – PPO | Admitting: Obstetrics and Gynecology

## 2021-05-07 ENCOUNTER — Other Ambulatory Visit: Payer: Self-pay | Admitting: *Deleted

## 2021-05-07 ENCOUNTER — Encounter: Payer: Self-pay | Admitting: Physician Assistant

## 2021-05-07 MED ORDER — BETAMETHASONE VALERATE 0.1 % EX OINT: TOPICAL_OINTMENT | CUTANEOUS | 1 refills | Status: DC

## 2021-05-07 MED ORDER — CLOTRIMAZOLE 1 % EX CREA: TOPICAL_CREAM | CUTANEOUS | 0 refills | Status: DC

## 2021-05-11 ENCOUNTER — Ambulatory Visit
Admission: RE | Admit: 2021-05-11 | Discharge: 2021-05-11 | Disposition: A | Discharge status: Home / Self Care | Payer: PRIVATE HEALTH INSURANCE | Source: Ambulatory Visit | Attending: Hematology and Oncology | Admitting: Hematology and Oncology

## 2021-05-11 ENCOUNTER — Ambulatory Visit
Admission: RE | Admit: 2021-05-11 | Discharge: 2021-05-11 | Disposition: A | Discharge status: Home / Self Care | Payer: PRIVATE HEALTH INSURANCE | Source: Ambulatory Visit | Attending: Adult Health | Admitting: Adult Health

## 2021-05-11 DIAGNOSIS — Z780 Asymptomatic menopausal state: Secondary | ICD-10-CM

## 2021-05-11 DIAGNOSIS — R922 Inconclusive mammogram: Secondary | ICD-10-CM | POA: Diagnosis not present

## 2021-05-11 DIAGNOSIS — Z9889 Other specified postprocedural states: Secondary | ICD-10-CM

## 2021-05-16 ENCOUNTER — Other Ambulatory Visit: Payer: Self-pay | Admitting: Physician Assistant

## 2021-06-12 DIAGNOSIS — G4733 Obstructive sleep apnea (adult) (pediatric): Secondary | ICD-10-CM | POA: Diagnosis not present

## 2021-06-18 DIAGNOSIS — Z170 Estrogen receptor positive status [ER+]: Secondary | ICD-10-CM | POA: Diagnosis not present

## 2021-06-18 DIAGNOSIS — C50411 Malignant neoplasm of upper-outer quadrant of right female breast: Secondary | ICD-10-CM | POA: Diagnosis not present

## 2021-06-28 DIAGNOSIS — Z800 Family history of malignant neoplasm of digestive organs: Secondary | ICD-10-CM | POA: Diagnosis not present

## 2021-06-28 DIAGNOSIS — Z1211 Encounter for screening for malignant neoplasm of colon: Secondary | ICD-10-CM | POA: Diagnosis not present

## 2021-06-28 DIAGNOSIS — K648 Other hemorrhoids: Secondary | ICD-10-CM | POA: Diagnosis not present

## 2021-07-13 ENCOUNTER — Encounter (HOSPITAL_COMMUNITY): Payer: Self-pay | Admitting: Psychiatry

## 2021-07-13 ENCOUNTER — Telehealth (HOSPITAL_BASED_OUTPATIENT_CLINIC_OR_DEPARTMENT_OTHER): Payer: BC Managed Care – PPO | Admitting: Psychiatry

## 2021-07-13 DIAGNOSIS — F330 Major depressive disorder, recurrent, mild: Secondary | ICD-10-CM

## 2021-07-13 DIAGNOSIS — F419 Anxiety disorder, unspecified: Secondary | ICD-10-CM

## 2021-07-13 MED ORDER — VORTIOXETINE HBR 20 MG PO TABS: ORAL_TABLET | ORAL | 2 refills | Status: DC

## 2021-07-13 NOTE — Progress Notes (Signed)
Virtual Visit via Telephone Note ? ?I connected with Sandra [name] on 07/13/21 at 10:20 AM EDT by telephone and verified that I am speaking with the correct person using two identifiers. ? ?Location: ?Patient: Work ?Provider: Home Office ?  ?I discussed the limitations, risks, security and privacy concerns of performing an evaluation and management service by telephone and the availability of in person appointments. I also discussed with the patient that there may be a patient responsible charge related to this service. The patient expressed understanding and agreed to proceed. ? ? ?History of Present Illness: ?Patient is evaluated by phone session.  She is taking Trintellix and doing well.  Her job is going well.  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  She reported things are going okay.  There were no panic attack.  She has not switched her therapist but like to try a different counselor.  She is happy her daughter is going to [facility].  Patient denies any crying spells or any feeling of hopelessness or worthlessness.  Her appetite is okay.  She reported no change in her appetite or weight.  She sleeps good.  She denies any suicidal thoughts.  She has no tremor or shakes or any EPS.  She like to keep the Trintellix. ? ?Past Psychiatric History:  ?[facility]  Tried Paxil, Lexapro, ativan and trazadone but don't remember.  Lamictal did not help and Wellbutrin caused depression.  We tried Effexor and Prozac but did not help.  No h/o psychosis, suicidal attempt or Inpatient.   ? ?Psychiatric Specialty Exam: ?Physical Exam  ?Review of Systems  ?There were no vitals taken for this visit.There is no height or weight on file to calculate BMI.  ?General Appearance: NA  ?Eye Contact:  NA  ?Speech:  Clear and Coherent  ?Volume:  Normal  ?Mood:  Euthymic  ?Affect:  NA  ?Thought Process:  Goal Directed  ?Orientation:  Full (Time, Place, and  Person)  ?Thought Content:  WDL  ?Suicidal Thoughts:  No  ?Homicidal Thoughts:  No  ?Memory:  Immediate;   Good ?Recent;   Good ?Remote;   Good  ?Judgement:  Good  ?Insight:  Good  ?Psychomotor Activity:  NA  ?Concentration:  Concentration: Good and Attention Span: Good  ?Recall:  Good  ?Fund of Knowledge:  Good  ?Language:  Good  ?Akathisia:  No  ?Handed:  Right  ?AIMS (if indicated):     ?Assets:  Communication Skills ?Desire for Improvement ?Housing ?Resilience ?Talents/Skills ?Transportation  ?ADL's:  Intact  ?Cognition:  WNL  ?Sleep:   ok  ? ? ? ? ?Assessment and Plan: ?Major depressive disorder, recurrent.  Anxiety. ? ?Patient is stable on Trintellix 10 mg daily.  Discussed medication side effects and benefits.  We will provide some mornings for individual therapy as patient like to switch her therapist.  Recommended to call us back if she is any question or any concern.  Follow-up in 3 months. ? ?Follow Up Instructions: ? ?  ?I discussed the assessment and treatment plan with the patient. The patient was provided an opportunity to ask questions and all were answered. The patient agreed with the plan and demonstrated an understanding of the instructions. ?  ?The patient was advised to call back or seek an in-person evaluation if the symptoms worsen or if the condition fails to improve as anticipated. ? ?Collaboration of Care: Primary Care Provider AEB notes  are available in epic to review ? ?Patient/Guardian was advised Release of Information must be obtained prior to any record release in order to collaborate their care with an outside provider. Patient/Guardian was advised if they have not already done so to contact the registration department to sign all necessary forms in order for [place] to release information regarding their care.  ? ?Consent: Patient/Guardian gives verbal consent for treatment and assignment of benefits for services provided during this visit. Patient/Guardian expressed understanding and  agreed to proceed.   ? ?I provided 13 minutes of non-face-to-face time during this encounter. ? ? ?Kathlee Nations, MD  ?

## 2021-07-31 DIAGNOSIS — D3121 Benign neoplasm of right retina: Secondary | ICD-10-CM | POA: Diagnosis not present

## 2021-09-14 ENCOUNTER — Other Ambulatory Visit: Payer: Self-pay | Admitting: Physician Assistant

## 2021-10-12 ENCOUNTER — Telehealth (HOSPITAL_BASED_OUTPATIENT_CLINIC_OR_DEPARTMENT_OTHER): Payer: BC Managed Care – PPO | Admitting: Psychiatry

## 2021-10-12 ENCOUNTER — Encounter (HOSPITAL_COMMUNITY): Payer: Self-pay | Admitting: Psychiatry

## 2021-10-12 DIAGNOSIS — F419 Anxiety disorder, unspecified: Secondary | ICD-10-CM

## 2021-10-12 DIAGNOSIS — F330 Major depressive disorder, recurrent, mild: Secondary | ICD-10-CM | POA: Diagnosis not present

## 2021-10-12 MED ORDER — VORTIOXETINE HBR 20 MG PO TABS: ORAL_TABLET | ORAL | 2 refills | Status: DC

## 2021-10-12 NOTE — Progress Notes (Signed)
Virtual Visit via Telephone Note  I connected with Sandra [name] on 10/12/21 at 10:20 AM EDT by telephone and verified that I am speaking with the correct person using two identifiers.  Location: Patient: Work Provider: Biomedical scientist   I discussed the limitations, risks, security and privacy concerns of performing an evaluation and management service by telephone and the availability of in person appointments. I also discussed with the patient that there may be a patient responsible charge related to this service. The patient expressed understanding and agreed to proceed.   History of Present Illness: Patient is evaluated by phone session.  She is doing better on her Trintellix.  She denies any crying spells or any feeling of hopelessness.  Her job is busy.  She is happy that her daughter is going to [facility].  Patient denies any tremors, shakes or any EPS.  Her appetite is okay.  Her sleep is good.  She denies any panic attack or feeling of nervousness or getting overwhelmed.  She really liked the Trintellix and like to keep the current medication.  Past Psychiatric History:  [facility]  Tried Paxil, Lexapro, ativan and trazadone but don't remember.  Lamictal did not help and Wellbutrin caused depression.  We tried Effexor and Prozac but did not help.  No h/o psychosis, suicidal attempt or Inpatient.    Psychiatric Specialty Exam: Physical Exam  Review of Systems  Weight 236 lb (107 kg).There is no height or weight on file to calculate BMI.  General Appearance: NA  Eye Contact:  NA  Speech:  Clear and Coherent and Normal Rate  Volume:  Normal  Mood:  Euthymic  Affect:  Appropriate  Thought Process:  Goal Directed  Orientation:  Full (Time, Place, and Person)  Thought Content:  Logical  Suicidal Thoughts:  No  Homicidal Thoughts:  No  Memory:  Immediate;   Good Recent;   Good Remote;   Good  Judgement:  Good  Insight:  Good  Psychomotor Activity:   Normal  Concentration:  Concentration: Good and Attention Span: Good  Recall:  Good  Fund of Knowledge:  Good  Language:  Good  Akathisia:  No  Handed:  Right  AIMS (if indicated):     Assets:  Communication Skills Desire for [place] Talents/Skills Transportation  ADL's:  Intact  Cognition:  WNL  Sleep:   ok      Assessment and Plan: Major depressive disorder, recurrent.  Anxiety.  Patient is stable on Trintellix 10 mg daily and she has no side effects.  Continue current medication.  Patient has not able to find a therapist and we will refer to therapist in our office.  I recommended to call us back if she has any question, concern or if she feels worsening of the symptoms.  Follow up in 3 months.  Follow Up Instructions:    I discussed the assessment and treatment plan with the patient. The patient was provided an opportunity to ask questions and all were answered. The patient agreed with the plan and demonstrated an understanding of the instructions.   The patient was advised to call back or seek an in-person evaluation if the symptoms worsen or if the condition fails to improve as anticipated.  Collaboration of Care: Primary Care Provider AEB notes are in epic to review.   Patient/Guardian was advised Release of Information must be obtained prior to any record release in order to collaborate their care with an  outside provider. Patient/Guardian was advised if they have not already done so to contact the registration department to sign all necessary forms in order for [place] to release information regarding their care.   Consent: Patient/Guardian gives verbal consent for treatment and assignment of benefits for services provided during this visit. Patient/Guardian expressed understanding and agreed to proceed.    I provided 15 minutes of non-face-to-face time during this encounter.   Kathlee Nations, MD

## 2021-11-28 NOTE — Progress Notes (Signed)
Patient Care Team: Allwardt, Randa Evens, PA-C as PCP - General (Physician Assistant) Sandra Carls, MD as Referring Physician (Specialist) Sandra Luna, MD as Consulting Physician (General Surgery) Sandra Lose, MD as Consulting Physician (Hematology and Oncology) Sandra Rudd, MD as Consulting Physician (Radiation Oncology)  DIAGNOSIS:  Encounter Diagnosis  Name Primary?   Malignant neoplasm of upper-outer quadrant of right breast in female, estrogen receptor positive ([name])     SUMMARY OF ONCOLOGIC HISTORY: Oncology History  Malignant neoplasm of upper-outer quadrant of right breast in female, estrogen receptor positive ([place])  11/11/2017 Initial Diagnosis   Palpable right breast mass at 10 o'clock position 1.7 cm, axilla negative, biopsy revealed grade 3 IDC ER 60%, PR 10%, Ki-67 70%, HER-2 positive, T1 CN 0 stage Ia   11/19/2017 Cancer Staging   Staging form: Breast, AJCC 8th Edition - Clinical: Stage IB (cT2, cN0, cM0, G3, ER+, PR+, HER2+) - Signed by Sandra Lose, MD on 12/04/2017   11/25/2017 Breast MRI   4.4 x 2.8 x 2.1 cm right breast malignancy UOQ, second focus 0.8 cm (biopsy planned for 12/09/2017)   12/04/2017 - 03/26/2018 Neo-Adjuvant Chemotherapy   TCH Perjeta every 3 week x6 followed by Herceptin Perjeta maintenance   01/04/2018 Genetic Testing   Negative genetic testing on the multi-cancer panel.  The Multi-Gene Panel offered by [facility] includes sequencing and/or deletion duplication testing of the following 84 genes: AIP, ALK, APC, ATM, AXIN2,BAP1,  BARD1, BLM, BMPR1A, BRCA1, BRCA2, BRIP1, CASR, CDC73, CDH1, CDK4, CDKN1B, CDKN1C, CDKN2A (p14ARF), CDKN2A (p16INK4a), CEBPA, CHEK2, CTNNA1, DICER1, DIS3L2, EGFR (c.2369C>T, p.Thr790Met variant only), EPCAM (Deletion/duplication testing only), FH, FLCN, GATA2, GPC3, GREM1 (Promoter region deletion/duplication testing only), HOXB13 (c.251G>A, p.Gly84Glu), HRAS, KIT, MAX, MEN1, MET, MITF (c.952G>A, p.Glu318Lys variant only), MLH1,  MSH2, MSH3, MSH6, MUTYH, NBN, NF1, NF2, NTHL1, PALB2, PDGFRA, PHOX2B, PMS2, POLD1, POLE, POT1, PRKAR1A, PTCH1, PTEN, RAD50, RAD51C, RAD51D, RB1, RECQL4, RET, RUNX1, SDHAF2, SDHA (sequence changes only), SDHB, SDHC, SDHD, SMAD4, SMARCA4, SMARCB1, SMARCE1, STK11, SUFU, TERC, TERT, TMEM127, TP53, TSC1, TSC2, VHL, WRN and WT1.  The report date is January 04, 2018.   04/23/2018 Surgery   Right lumpectomy ([place]) [phone]): Small residual invasive cancer multifocal 0.15 cm, grade 2, 0/3 lymph nodes all negative, ER 90%, PR 60%, HER-2 +3+ by IHC, Ki-67 20%, RCB class I, ympT1a, ypN0   05/07/2018 - 12/03/2018 Chemotherapy   The patient had ado-trastuzumab emtansine (KADCYLA) 300 mg in sodium chloride 0.9 % 250 mL chemo infusion, 3.6 mg/kg = 300 mg, Intravenous, Once, 11 of 11 cycles Dose modification: 2.4 mg/kg (original dose 3.6 mg/kg, Cycle 8, Reason: Dose not tolerated) Administration: 300 mg (05/07/2018), 300 mg (05/28/2018), 300 mg (07/30/2018), 300 mg (08/20/2018), 300 mg (09/10/2018), 200 mg (10/01/2018), 200 mg (10/22/2018), 200 mg (11/12/2018), 200 mg (12/03/2018), 300 mg (06/18/2018), 300 mg (07/09/2018)  for chemotherapy treatment.    06/01/2018 - 07/15/2018 Radiation Therapy   The patient initially received a dose of 50.4 Gy in 28 fractions to the right breast using whole-breast tangent fields. This was delivered using a 3-D conformal technique. The patient then received a boost to the seroma. This delivered an additional 10 Gy in 5 fractions using 6X photons with a Complex Isodose technique. The total dose was 60.4 Gy.   12/2018 - 12/2023 Anti-estrogen oral therapy   Anastrozole     CHIEF COMPLIANT: Follow-up of right breast cancer on letrozole therapy  INTERVAL HISTORY: Sandra [name] is a 56 y.o. with above-mentioned history of HER-2 positive right breast cancer  treated with neoadjuvant chemotherapy with [place] Perjeta, right lumpectomy, Kady[place]a maintenance, and who discontinued antiestrogen therapy. She  presents to the clinic today for a follow-up. She states that she is tolerating the letrozole. Still have hot flashes but manageable. Denies pain and discomfort in breast. Her work stress has got better from last year.   ALLERGIES:  has No Known Allergies.  MEDICATIONS:  Current Outpatient Medications  Medication Sig Dispense Refill   betamethasone valerate ointment (VALISONE) 0.1 % APPLY TWICE A DAY FOR 1-2 WEEKS AS NEEDED FOR FLARE OF LICHEN SCLEROSIS. 15 g 1   clotrimazole (LOTRIMIN) 1 % cream APPLY TO AFFECTED AREA TWICE A DAY 45 g 0   vortioxetine HBr (TRINTELLIX) 20 MG TABS tablet Take one tab daily 30 tablet 2   letrozole (FEMARA) 2.5 MG tablet TAKE HALF A TABLET BY MOUTH DAILY. 45 tablet 3   No current facility-administered medications for this visit.    PHYSICAL EXAMINATION: ECOG PERFORMANCE STATUS: 1 - Symptomatic but completely ambulatory  Vitals:   11/30/21 0835  BP: 131/79  Pulse: 76  Resp: 14  Temp: (!) 97 F (36.1 C)  SpO2: 98%   Filed Weights   11/30/21 0835  Weight: 246 lb 8 oz (111.8 kg)    BREAST: No palpable masses or nodules in either right or left breasts.  Right breast has been reconstructed.  No palpable axillary supraclavicular or infraclavicular adenopathy no breast tenderness or nipple discharge. (exam performed in the presence of a chaperone)  LABORATORY DATA:  I have reviewed the data as listed    Latest Ref Rng & Units 02/26/2021    8:08 AM 12/22/2018    8:30 AM 12/03/2018    9:28 AM  CMP  Glucose 70 - 99 mg/dL 96  88  82   BUN 6 - 23 mg/dL '16  14  17   ' Creatinine 0.40 - 1.20 mg/dL 0.97  0.99  0.89   Sodium 135 - 145 mEq/L 139  141  139   Potassium 3.5 - 5.1 mEq/L 4.3  4.3  3.8   Chloride 96 - 112 mEq/L 103  103  104   CO2 19 - 32 mEq/L '26  29  26   ' Calcium 8.4 - 10.5 mg/dL 10.0  9.6  9.4   Total Protein 6.0 - 8.3 g/dL 7.3  7.4  7.6   Total Bilirubin 0.2 - 1.2 mg/dL 0.5  0.4  0.3   Alkaline Phos 39 - 117 U/L 58  68  68   AST 0 - 37 U/L  17  38  29   ALT 0 - 35 U/L 22  38  26     Lab Results  Component Value Date   WBC 4.4 02/26/2021   HGB 13.1 02/26/2021   HCT 38.7 02/26/2021   MCV 91.2 02/26/2021   PLT 256.0 02/26/2021   NEUTROABS 2.4 02/26/2021    ASSESSMENT & PLAN:  Malignant neoplasm of upper-outer quadrant of right breast in female, estrogen receptor positive ([place]) 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%, PR 10%, Ki-67 70%, HER-2 positive, T1 CN 0 stage Ia Breast MRI 11/25/2017: 4.4 x 2.8 x 2.1 cm right breast malignancy UOQ, second focus 0.8 cm (biopsy planned for 12/09/2017), overall extent of disease is 7 cm 12/12/2017:Biopsy results of the MRI guided biopsies 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  Neoadjuvant chemotherapy with [place] Perjeta  12/04/2017-03/26/2018 followed by Kadcyla x 1 year completed 12/03/2018 2. breast conserving surgery with sentinel lymph node biopsy: 04/23/2018 3. Radiation therapy: Completed 07/15/2018 4.  Adjuvant antiestrogen therapy started 12/15/2018 -------------------------------------------------------------------------------------------------------------------------------- 04/23/2018:Right lumpectomy: Small residual invasive cancer multifocal 0.15 cm, grade 2, 0/3 lymph nodes all negative, ER 90%, PR 60%, HER-2 +3+ by IHC, Ki-67 20%, RCB class I, ympT1a, ypN0   Echo 03/11/2018: EF 60-65%   Current treatment: Anastrozole 1 mg daily stopped 09/21/2019 because of severe fatigue and severe joint pains, switched to letrozole half tablet daily started 11/19/2019   Letrozole toxicities: Muscle aches and pains have disappeared at half a tablet daily Continues to suffer from fatigue issues.   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  She has undergone some therapy with her psychiatrist which appears to have helped as well.  Weight gain issues: She has been undecided on the weight loss injections   Return to clinic in 1 year for follow-up    No orders of the defined types were placed in this encounter.  The patient has a good understanding of the overall plan. she agrees with it. she will call with any problems that may develop before the next visit here. Total time spent: 30 mins including face to face time and time spent for planning, charting and co-ordination of care   Harriette Ohara, MD 11/30/21    I Gardiner Coins am scribing for Dr. Lindi [name]  I have reviewed the above documentation for accuracy and completeness, and I agree with the above.

## 2021-11-30 ENCOUNTER — Other Ambulatory Visit: Payer: Self-pay

## 2021-11-30 ENCOUNTER — Inpatient Hospital Stay: Payer: BC Managed Care – PPO | Attending: Hematology and Oncology | Admitting: Hematology and Oncology

## 2021-11-30 DIAGNOSIS — Z79811 Long term (current) use of aromatase inhibitors: Secondary | ICD-10-CM | POA: Insufficient documentation

## 2021-11-30 DIAGNOSIS — C50411 Malignant neoplasm of upper-outer quadrant of right female breast: Secondary | ICD-10-CM

## 2021-11-30 DIAGNOSIS — Z170 Estrogen receptor positive status [ER+]: Secondary | ICD-10-CM | POA: Diagnosis not present

## 2021-11-30 DIAGNOSIS — R5383 Other fatigue: Secondary | ICD-10-CM | POA: Insufficient documentation

## 2021-11-30 MED ORDER — LETROZOLE 2.5 MG PO TABS: ORAL_TABLET | ORAL | 3 refills | Status: DC

## 2021-11-30 NOTE — Assessment & Plan Note (Addendum)
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  Treatment plan: 1.Neoadjuvant chemotherapy with [place] Perjeta 12/04/2017-12/26/2019followed by Kadcyla x 1 yearcompleted 12/03/2018 2.breast conserving surgery with sentinel lymph node biopsy: 04/23/2018 3.Radiation therapy:Completed4/15/2020 4.Adjuvant antiestrogen therapystarted 12/15/2018 -------------------------------------------------------------------------------------------------------------------------------- 04/23/2018:Right lumpectomy: Small residual invasive cancer multifocal 0.15 cm, grade 2, 0/3 lymph nodes all negative, ER 90%, PR 60%, HER-2 +3+ by IHC, Ki-67 20%, RCB class I, ympT1a, ypN0  Echo 03/11/2018: EF 60-65%  Current treatment:Anastrozole 1 mg daily stopped 6/22/2021because of severe fatigue and severe joint pains, switched to letrozole half tablet daily started 11/19/2019  Letrozole toxicities:Muscle aches and pains have disappeared at half a tablet daily Continues to suffer from fatigue issues.  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  She has undergone some therapy with her psychiatrist which appears to have helped as well.  Weight gain issues: She has been undecided on the weight loss injections  Return to clinic in 1 year for  follow-up

## 2022-01-11 ENCOUNTER — Telehealth (HOSPITAL_COMMUNITY): Payer: PRIVATE HEALTH INSURANCE | Admitting: Psychiatry

## 2022-01-14 ENCOUNTER — Telehealth: Payer: Self-pay | Admitting: Oncology

## 2022-01-14 NOTE — Telephone Encounter (Signed)
01/14/22 - Upbeat Study year 4 phone call. Called and spoke to patient over the phone for the Upbeat Study.  The patient had not been in the hospital in the last year or had any cardiovascular events in the last year.  I asked if she wanted to do the questionnaire over the phone for by e-mail.  She asked for it to be sent to her e-mail.  I confirmed her e-mail address and I said she would receive it this afternoon or tomorrow and just fill it out and send it back in.   I thanked her for her participation in this clinical trial and I would talk with her next year. Remer Macho 01/14/22 - 10:50 am

## 2022-01-16 DIAGNOSIS — R252 Cramp and spasm: Secondary | ICD-10-CM | POA: Diagnosis not present

## 2022-01-16 DIAGNOSIS — R5383 Other fatigue: Secondary | ICD-10-CM | POA: Diagnosis not present

## 2022-01-16 DIAGNOSIS — E785 Hyperlipidemia, unspecified: Secondary | ICD-10-CM | POA: Diagnosis not present

## 2022-01-16 DIAGNOSIS — Z77120 Contact with and (suspected) exposure to mold (toxic): Secondary | ICD-10-CM | POA: Diagnosis not present

## 2022-01-16 DIAGNOSIS — E669 Obesity, unspecified: Secondary | ICD-10-CM | POA: Diagnosis not present

## 2022-01-21 ENCOUNTER — Encounter (HOSPITAL_COMMUNITY): Payer: Self-pay | Admitting: Psychiatry

## 2022-01-21 ENCOUNTER — Telehealth (HOSPITAL_BASED_OUTPATIENT_CLINIC_OR_DEPARTMENT_OTHER): Payer: PRIVATE HEALTH INSURANCE | Admitting: Psychiatry

## 2022-01-21 DIAGNOSIS — F330 Major depressive disorder, recurrent, mild: Secondary | ICD-10-CM | POA: Diagnosis not present

## 2022-01-21 DIAGNOSIS — F419 Anxiety disorder, unspecified: Secondary | ICD-10-CM

## 2022-01-21 MED ORDER — VORTIOXETINE HBR 20 MG PO TABS: ORAL_TABLET | ORAL | 2 refills | Status: DC

## 2022-01-21 NOTE — Progress Notes (Signed)
Virtual Visit via Telephone Note  I connected with Sandra [name] on 01/21/22 at  9:30 AM EDT by telephone and verified that I am speaking with the correct person using two identifiers.  Location: Patient: Work Provider: Biomedical scientist   I discussed the limitations, risks, security and privacy concerns of performing an evaluation and management service by telephone and the availability of in person appointments. I also discussed with the patient that there may be a patient responsible charge related to this service. The patient expressed understanding and agreed to proceed.   History of Present Illness: Patient is evaluated by phone session.  She is doing well on Trintellix and recently had a visit with her oncologist and things are stable.  She is in the process of getting a new therapist at [facility].  She like we discussed about her family members as she noticed her 9 year old daughter is still struggle with anxiety.  She is sleeping good.  She is hoping the appointment with therapist will addressed family issues and to have a better given medication with her husband and her 2 daughters.  Her job is going well.  She has no tremor or shakes or any EPS.  He denies any panic attack.  She sleeps good.  She denies any crying spells or any feeling of hopelessness or worthlessness.  Energy level is okay.  She admitted few pounds weight gain because she is not as active but like to go back with exercise.  She denies any suicidal thoughts.  Past Psychiatric History:  [facility]  Tried Paxil, Lexapro, ativan and trazadone but don't remember.  Lamictal did not help and Wellbutrin caused depression.  We tried Effexor and Prozac but did not help.  No h/o psychosis, suicidal attempt or Inpatient.      Psychiatric Specialty Exam: Physical Exam  Review of Systems  Weight 246 lb (111.6 kg).There is no height or weight on file to calculate BMI.  General Appearance: NA   Eye Contact:  NA  Speech:  Clear and Coherent  Volume:  Normal  Mood:  Euthymic  Affect:  NA  Thought Process:  Goal Directed  Orientation:  Full (Time, Place, and Person)  Thought Content:  Logical  Suicidal Thoughts:  No  Homicidal Thoughts:  No  Memory:  Immediate;   Good Recent;   Good Remote;   Good  Judgement:  Good  Insight:  Present  Psychomotor Activity:  NA  Concentration:  Concentration: Good and Attention Span: Good  Recall:  Good  Fund of Knowledge:  Good  Language:  Good  Akathisia:  No  Handed:  Right  AIMS (if indicated):     Assets:  Communication Skills Desire for Improvement Housing Resilience Social Support Talents/Skills Transportation  ADL's:  Intact  Cognition:  WNL  Sleep:   ok      Assessment and Plan: Major depressive disorder, recurrent.  Anxiety.  Patient is stable on Trintellix 10 mg and now she is in the process of seeing a new therapist at [facility].  Discussed medication side effects and benefits.  Recommended to call us back if she has any question of any concern.  Follow-up in 3 months.  Follow Up Instructions:    I discussed the assessment and treatment plan with the patient. The patient was provided an opportunity to ask questions and all were answered. The patient agreed with the plan and demonstrated an understanding of the instructions.   The patient was  advised to call back or seek an in-person evaluation if the symptoms worsen or if the condition fails to improve as anticipated.  Collaboration of Care: Other provider involved in patient's care AEB notes are available in epic to review.  Patient/Guardian was advised Release of Information must be obtained prior to any record release in order to collaborate their care with an outside provider. Patient/Guardian was advised if they have not already done so to contact the registration department to sign all necessary forms in order for [place] to release information  regarding their care.   Consent: Patient/Guardian gives verbal consent for treatment and assignment of benefits for services provided during this visit. Patient/Guardian expressed understanding and agreed to proceed.    I provided 18 minutes of non-face-to-face time during this encounter.   Kathlee Nations, MD

## 2022-01-31 ENCOUNTER — Other Ambulatory Visit: Payer: Self-pay | Admitting: Physician Assistant

## 2022-02-18 DIAGNOSIS — F330 Major depressive disorder, recurrent, mild: Secondary | ICD-10-CM | POA: Diagnosis not present

## 2022-02-20 DIAGNOSIS — R5383 Other fatigue: Secondary | ICD-10-CM | POA: Diagnosis not present

## 2022-02-20 DIAGNOSIS — E669 Obesity, unspecified: Secondary | ICD-10-CM | POA: Diagnosis not present

## 2022-02-20 DIAGNOSIS — Z77120 Contact with and (suspected) exposure to mold (toxic): Secondary | ICD-10-CM | POA: Diagnosis not present

## 2022-02-20 DIAGNOSIS — E785 Hyperlipidemia, unspecified: Secondary | ICD-10-CM | POA: Diagnosis not present

## 2022-03-06 DIAGNOSIS — F330 Major depressive disorder, recurrent, mild: Secondary | ICD-10-CM | POA: Diagnosis not present

## 2022-03-14 ENCOUNTER — Encounter: Payer: Self-pay | Admitting: *Deleted

## 2022-04-09 ENCOUNTER — Other Ambulatory Visit: Payer: Self-pay | Admitting: Adult Health

## 2022-04-09 DIAGNOSIS — Z9889 Other specified postprocedural states: Secondary | ICD-10-CM

## 2022-04-09 DIAGNOSIS — F330 Major depressive disorder, recurrent, mild: Secondary | ICD-10-CM | POA: Diagnosis not present

## 2022-04-22 ENCOUNTER — Telehealth (HOSPITAL_BASED_OUTPATIENT_CLINIC_OR_DEPARTMENT_OTHER): Payer: BC Managed Care – PPO | Admitting: Psychiatry

## 2022-04-22 ENCOUNTER — Encounter (HOSPITAL_COMMUNITY): Payer: Self-pay | Admitting: Psychiatry

## 2022-04-22 ENCOUNTER — Encounter (HOSPITAL_COMMUNITY): Payer: PRIVATE HEALTH INSURANCE | Admitting: Psychiatry

## 2022-04-22 DIAGNOSIS — F330 Major depressive disorder, recurrent, mild: Secondary | ICD-10-CM | POA: Diagnosis not present

## 2022-04-22 DIAGNOSIS — F419 Anxiety disorder, unspecified: Secondary | ICD-10-CM | POA: Diagnosis not present

## 2022-04-22 MED ORDER — VORTIOXETINE HBR 20 MG PO TABS: ORAL_TABLET | ORAL | 2 refills | Status: DC

## 2022-04-22 NOTE — Progress Notes (Signed)
Virtual Visit via Telephone Note  I connected with Cleora Fleet Czarnecki on 04/22/22 at  1:20 PM EST by telephone and verified that I am speaking with the correct person using two identifiers.  Location: Patient: Home Provider: Home Office   I discussed the limitations, risks, security and privacy concerns of performing an evaluation and management service by telephone and the availability of in person appointments. I also discussed with the patient that there may be a patient responsible charge related to this service. The patient expressed understanding and agreed to proceed.   History of Present Illness: Patient is evaluated by phone session.  She is doing well on Trintellix.  Patient reported Christmas was quiet.  Her husband's father came on Christmas and had a good time.  Patient reported her 56 year old daughter is adjusting better at [facility].  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  Patient admitted lately has been sick with upper respiratory infection but slowly and gradually getting better.  She is in therapy at [facility] and lately also getting some nutritional consult.  She is trying to lose weight and stopped the soda.  She liked the Trintellix that is helping her anxiety depression.  Her job is going well.  She has no tremors, shakes or any EPS.  She denies any panic attack.  Past Psychiatric History:  [facility]  Tried Paxil, Lexapro, ativan and trazadone but don't remember.  Lamictal did not help and Wellbutrin caused depression.  We tried Effexor and Prozac but did not help.  No h/o psychosis, suicidal attempt or Inpatient.     Psychiatric Specialty Exam: Physical Exam  Review of Systems  Weight 246 lb (111.6 kg).There is no height or weight on file to calculate BMI.  General Appearance: NA  Eye Contact:  NA  Speech:  Clear and Coherent and Normal Rate  Volume:  Normal  Mood:   Euthymic  Affect:  NA  Thought Process:  Goal Directed  Orientation:  Full (Time, Place, and Person)  Thought Content:  Logical  Suicidal Thoughts:  No  Homicidal Thoughts:  No  Memory:  Immediate;   Good Recent;   Good Remote;   Good  Judgement:  Good  Insight:  Good  Psychomotor Activity:  Normal  Concentration:  Concentration: Good and Attention Span: Good  Recall:  Good  Fund of Knowledge:  Good  Language:  Good  Akathisia:  No  Handed:  Right  AIMS (if indicated):     Assets:  Communication Skills Desire for Improvement Housing Resilience Social Support Talents/Skills Transportation  ADL's:  Intact  Cognition:  WNL  Sleep:   ok      Assessment and Plan: Major depressive disorder, recurrent.  Anxiety.  Patient is doing well on Trintellix 10 mg daily.  She is also started therapy at [facility] and trying to focus on her nutrition.  Discussed medication side effects and benefits.  Recommend to call us back if she is any question or any concern.  Follow-up in 3 months.  Follow Up Instructions:    I discussed the assessment and treatment plan with the patient. The patient was provided an opportunity to ask questions and all were answered. The patient agreed with the plan and demonstrated an understanding of the instructions.   The patient was advised to call back or seek an in-person evaluation if the symptoms worsen or if the condition fails to improve  as anticipated.  Collaboration of Care: Other provider involved in patient's care AEB notes are available in epic to review.  Patient/Guardian was advised Release of Information must be obtained prior to any record release in order to collaborate their care with an outside provider. Patient/Guardian was advised if they have not already done so to contact the registration department to sign all necessary forms in order for [place] to release information regarding their care.   Consent: Patient/Guardian gives  verbal consent for treatment and assignment of benefits for services provided during this visit. Patient/Guardian expressed understanding and agreed to proceed.    I provided 12 minutes of non-face-to-face time during this encounter.   Kathlee Nations, MD

## 2022-04-23 NOTE — Progress Notes (Signed)
No show.

## 2022-04-30 DIAGNOSIS — F330 Major depressive disorder, recurrent, mild: Secondary | ICD-10-CM | POA: Diagnosis not present

## 2022-05-13 ENCOUNTER — Ambulatory Visit
Admission: RE | Admit: 2022-05-13 | Discharge: 2022-05-13 | Disposition: A | Discharge status: Home / Self Care | Payer: BC Managed Care – PPO | Source: Ambulatory Visit | Attending: Adult Health | Admitting: Adult Health

## 2022-05-13 DIAGNOSIS — R928 Other abnormal and inconclusive findings on diagnostic imaging of breast: Secondary | ICD-10-CM | POA: Diagnosis not present

## 2022-05-13 DIAGNOSIS — Z853 Personal history of malignant neoplasm of breast: Secondary | ICD-10-CM | POA: Diagnosis not present

## 2022-05-13 DIAGNOSIS — Z9889 Other specified postprocedural states: Secondary | ICD-10-CM

## 2022-05-14 DIAGNOSIS — F330 Major depressive disorder, recurrent, mild: Secondary | ICD-10-CM | POA: Diagnosis not present

## 2022-05-27 DIAGNOSIS — F330 Major depressive disorder, recurrent, mild: Secondary | ICD-10-CM | POA: Diagnosis not present

## 2022-06-02 ENCOUNTER — Other Ambulatory Visit: Payer: Self-pay | Admitting: Physician Assistant

## 2022-06-03 DIAGNOSIS — R252 Cramp and spasm: Secondary | ICD-10-CM | POA: Diagnosis not present

## 2022-06-03 DIAGNOSIS — R5383 Other fatigue: Secondary | ICD-10-CM | POA: Diagnosis not present

## 2022-06-03 DIAGNOSIS — M2550 Pain in unspecified joint: Secondary | ICD-10-CM | POA: Diagnosis not present

## 2022-06-03 DIAGNOSIS — E785 Hyperlipidemia, unspecified: Secondary | ICD-10-CM | POA: Diagnosis not present

## 2022-06-03 DIAGNOSIS — F32A Depression, unspecified: Secondary | ICD-10-CM | POA: Diagnosis not present

## 2022-06-03 DIAGNOSIS — Z77120 Contact with and (suspected) exposure to mold (toxic): Secondary | ICD-10-CM | POA: Diagnosis not present

## 2022-06-03 DIAGNOSIS — E669 Obesity, unspecified: Secondary | ICD-10-CM | POA: Diagnosis not present

## 2022-06-11 DIAGNOSIS — F330 Major depressive disorder, recurrent, mild: Secondary | ICD-10-CM | POA: Diagnosis not present

## 2022-06-24 DIAGNOSIS — Z77120 Contact with and (suspected) exposure to mold (toxic): Secondary | ICD-10-CM | POA: Diagnosis not present

## 2022-06-24 DIAGNOSIS — E785 Hyperlipidemia, unspecified: Secondary | ICD-10-CM | POA: Diagnosis not present

## 2022-06-24 DIAGNOSIS — R5383 Other fatigue: Secondary | ICD-10-CM | POA: Diagnosis not present

## 2022-06-24 DIAGNOSIS — E669 Obesity, unspecified: Secondary | ICD-10-CM | POA: Diagnosis not present

## 2022-06-25 DIAGNOSIS — F330 Major depressive disorder, recurrent, mild: Secondary | ICD-10-CM | POA: Diagnosis not present

## 2022-07-15 DIAGNOSIS — F330 Major depressive disorder, recurrent, mild: Secondary | ICD-10-CM | POA: Diagnosis not present

## 2022-07-22 ENCOUNTER — Encounter (HOSPITAL_COMMUNITY): Payer: Self-pay | Admitting: Psychiatry

## 2022-07-22 ENCOUNTER — Telehealth (HOSPITAL_BASED_OUTPATIENT_CLINIC_OR_DEPARTMENT_OTHER): Payer: BC Managed Care – PPO | Admitting: Psychiatry

## 2022-07-22 DIAGNOSIS — F419 Anxiety disorder, unspecified: Secondary | ICD-10-CM

## 2022-07-22 DIAGNOSIS — F330 Major depressive disorder, recurrent, mild: Secondary | ICD-10-CM | POA: Diagnosis not present

## 2022-07-22 MED ORDER — VORTIOXETINE HBR 20 MG PO TABS: ORAL_TABLET | ORAL | 2 refills | Status: DC

## 2022-07-22 NOTE — Progress Notes (Signed)
[facility] MD Virtual Progress Note   Patient Location: Work Provider Location: Home Office  I connect with patient by telephone and verified that I am speaking with correct person by using two identifiers. I discussed the limitations of evaluation and management by telemedicine and the availability of in person appointments. I also discussed with the patient that there may be a patient responsible charge related to this service. The patient expressed understanding and agreed to proceed.  Sandra [name] [id number] 57 y.o.  07/22/2022 2:27 PM  History of Present Illness:  Patient is evaluated by phone session.  She is doing well on her current medication.  She started seeing therapist Patrecia Pour and that has been going well.  She also with [facility] that helps the natural healing.  She is taking vitamin D and Aswanda.  She sleeps good.  She works for [employer] and her job is going well.  She denies any crying spells or any feeling of hopelessness or worthlessness.  She denies any panic attack.  Her appetite is okay.  She lost few pounds since the last visit.  She has no tremors, shakes or any EPS.  She liked the Trintellix.  Past Psychiatric History: [facility]  Tried Paxil, Lexapro, ativan and trazadone but don't remember.  Lamictal did not help and Wellbutrin caused depression.  We tried Effexor and Prozac but did not help.  No h/o psychosis, suicidal attempt or Inpatient.      Outpatient Encounter Medications as of 07/22/2022  Medication Sig   betamethasone valerate ointment (VALISONE) 0.1 % APPLY TWICE A DAY FOR 1-2 WEEKS AS NEEDED FOR FLARE OF LICHEN SCLEROSIS.   clotrimazole (LOTRIMIN) 1 % cream APPLY TO AFFECTED AREA TWICE A DAY   letrozole (FEMARA) 2.5 MG tablet TAKE HALF A TABLET BY MOUTH DAILY.   vortioxetine HBr (TRINTELLIX) 20 MG TABS tablet Take one tab daily   No facility-administered encounter medications on file as  of 07/22/2022.    No results found for this or any previous visit (from the past 2160 hour(s)).   Psychiatric Specialty Exam: Physical Exam  Review of Systems  Weight 236 lb (107 kg).There is no height or weight on file to calculate BMI.  General Appearance: NA  Eye Contact:  NA  Speech:  Clear and Coherent and Normal Rate  Volume:  Normal  Mood:  Euthymic  Affect:  Appropriate  Thought Process:  Goal Directed  Orientation:  Full (Time, Place, and Person)  Thought Content:  Logical  Suicidal Thoughts:  No  Homicidal Thoughts:  No  Memory:  Immediate;   Good Recent;   Good Remote;   Good  Judgement:  Good  Insight:  Present  Psychomotor Activity:  NA  Concentration:  Concentration: Good and Attention Span: Good  Recall:  Good  Fund of Knowledge:  Good  Language:  Good  Akathisia:  No  Handed:  Right  AIMS (if indicated):     Assets:  Communication Skills Desire for Improvement Housing Resilience Social Support Talents/Skills Transportation  ADL's:  Intact  Cognition:  WNL  Sleep:  ok     Assessment/Plan: Anxiety - Plan: vortioxetine HBr (TRINTELLIX) 20 MG TABS tablet  MDD (major depressive disorder), recurrent episode, mild - Plan: vortioxetine HBr (TRINTELLIX) 20 MG TABS tablet  Patient is stable on current medication.  Continue Trintellix 10 mg daily.  Encouraged to continue therapy at [facility] and with Fenton Malling.  Discussed medication side effects and benefits.  Recommend to call us back if she has any questions or any concern.  Follow-up in 3 months.   Follow Up Instructions:     I discussed the assessment and treatment plan with the patient. The patient was provided an opportunity to ask questions and all were answered. The patient agreed with the plan and demonstrated an understanding of the instructions.   The patient was advised to call back or seek an in-person evaluation if the symptoms worsen or if the condition fails to improve as  anticipated.    Collaboration of Care: Other provider involved in patient's care AEB notes are available in epic to review.  Patient/Guardian was advised Release of Information must be obtained prior to any record release in order to collaborate their care with an outside provider. Patient/Guardian was advised if they have not already done so to contact the registration department to sign all necessary forms in order for [place] to release information regarding their care.   Consent: Patient/Guardian gives verbal consent for treatment and assignment of benefits for services provided during this visit. Patient/Guardian expressed understanding and agreed to proceed.     I provided 17 minutes of non face to face time during this encounter.  Note: This document was prepared by [employer] voice dictation technology and any errors that results from this process are unintentional.    Cleotis Nipper, MD 07/22/2022

## 2022-07-30 DIAGNOSIS — F330 Major depressive disorder, recurrent, mild: Secondary | ICD-10-CM | POA: Diagnosis not present

## 2022-09-09 DIAGNOSIS — F330 Major depressive disorder, recurrent, mild: Secondary | ICD-10-CM | POA: Diagnosis not present

## 2022-10-08 DIAGNOSIS — F330 Major depressive disorder, recurrent, mild: Secondary | ICD-10-CM | POA: Diagnosis not present

## 2022-10-11 DIAGNOSIS — Z6834 Body mass index (BMI) 34.0-34.9, adult: Secondary | ICD-10-CM | POA: Diagnosis not present

## 2022-10-11 DIAGNOSIS — M25562 Pain in left knee: Secondary | ICD-10-CM | POA: Diagnosis not present

## 2022-10-11 DIAGNOSIS — E6609 Other obesity due to excess calories: Secondary | ICD-10-CM | POA: Diagnosis not present

## 2022-10-11 DIAGNOSIS — S8392XA Sprain of unspecified site of left knee, initial encounter: Secondary | ICD-10-CM | POA: Diagnosis not present

## 2022-10-11 DIAGNOSIS — W19XXXA Unspecified fall, initial encounter: Secondary | ICD-10-CM | POA: Diagnosis not present

## 2022-10-14 ENCOUNTER — Encounter (HOSPITAL_COMMUNITY): Payer: Self-pay | Admitting: Psychiatry

## 2022-10-14 ENCOUNTER — Telehealth (HOSPITAL_BASED_OUTPATIENT_CLINIC_OR_DEPARTMENT_OTHER): Payer: BC Managed Care – PPO | Admitting: Psychiatry

## 2022-10-14 DIAGNOSIS — F330 Major depressive disorder, recurrent, mild: Secondary | ICD-10-CM | POA: Diagnosis not present

## 2022-10-14 DIAGNOSIS — F419 Anxiety disorder, unspecified: Secondary | ICD-10-CM | POA: Diagnosis not present

## 2022-10-14 MED ORDER — VORTIOXETINE HBR 20 MG PO TABS: ORAL_TABLET | ORAL | 2 refills | Status: DC

## 2022-10-14 NOTE — Progress Notes (Addendum)
[facility] MD Virtual Progress Note   Patient Location: Work Provider Location: Home Office  I connect with patient by telephone and verified that I am speaking with correct person by using two identifiers. I discussed the limitations of evaluation and management by telemedicine and the availability of in person appointments. I also discussed with the patient that there may be a patient responsible charge related to this service. The patient expressed understanding and agreed to proceed.  Sandra [name] [id number] 57 y.o.  10/14/2022 3:42 PM  History of Present Illness:  Patient is evaluated by phone today.  She is stable on Trintellix.  She denies any crying spells or any feeling of hopelessness or worthlessness.  She is taking vitamin D.  Her job is busy but manageable.  She has no tremors, shakes or any EPS.  She denies any suicidal thoughts.  Her sleep is good.  She does go to [facility] for her other medical needs.  She wants to keep the Trintellix 20 mg which is working very well for her.  She denies any anhedonia, mania or any anger.  Her appetite is okay.  Her weight is stable.  Patient denies any panic attack.  Past Psychiatric History: [facility]  Tried Paxil, Lexapro, ativan and trazadone but don't remember.  Lamictal did not help and Wellbutrin caused depression.  We tried Effexor and Prozac but did not help.  No h/o psychosis, suicidal attempt or Inpatient.      Outpatient Encounter Medications as of 10/14/2022  Medication Sig   acyclovir (ZOVIRAX) 800 MG tablet Take 800 mg by mouth 2 (two) times daily.   betamethasone valerate ointment (VALISONE) 0.1 % APPLY TWICE A DAY FOR 1-2 WEEKS AS NEEDED FOR FLARE OF LICHEN SCLEROSIS.   clotrimazole (LOTRIMIN) 1 % cream APPLY TO AFFECTED AREA TWICE A DAY   letrozole (FEMARA) 2.5 MG tablet TAKE HALF A TABLET BY MOUTH DAILY.   vortioxetine HBr (TRINTELLIX) 20 MG TABS tablet Take one tab daily    No facility-administered encounter medications on file as of 10/14/2022.    No results found for this or any previous visit (from the past 2160 hour(s)).   Psychiatric Specialty Exam: Physical Exam  Review of Systems  Weight 240 lb (108.9 kg).There is no height or weight on file to calculate BMI.  General Appearance: NA  Eye Contact:  NA  Speech:  Clear and Coherent and Normal Rate  Volume:  Normal  Mood:  Euthymic  Affect:  NA  Thought Process:  Goal Directed  Orientation:  Full (Time, Place, and Person)  Thought Content:  WDL  Suicidal Thoughts:  No  Homicidal Thoughts:  No  Memory:  Immediate;   Good Recent;   Good Remote;   Good  Judgement:  Good  Insight:  Present  Psychomotor Activity:  Normal  Concentration:  Concentration: Good and Attention Span: Good  Recall:  Good  Fund of Knowledge:  Good  Language:  Good  Akathisia:  No  Handed:  Right  AIMS (if indicated):     Assets:  Communication Skills Desire for Improvement Housing Resilience Social Support Talents/Skills Transportation  ADL's:  Intact  Cognition:  WNL  Sleep:  ok     Assessment/Plan: Anxiety - Plan: vortioxetine HBr (TRINTELLIX) 20 MG TABS tablet  MDD (major depressive disorder), recurrent episode, mild (HCC) - Plan: vortioxetine HBr (TRINTELLIX) 20 MG TABS tablet  Patient is a stable on current medication.  Continue Trintellix 20 mg daily.  Recommend to call us back if she has any question or any concern.  She also seeing therapist Gerarda Gunther at [facility].  Recommend to call us back if she has any questions or any concern.  Follow-up in 3 months.   Follow Up Instructions:     I discussed the assessment and treatment plan with the patient. The patient was provided an opportunity to ask questions and all were answered. The patient agreed with the plan and demonstrated an understanding of the instructions.   The patient was advised to call back or seek an in-person evaluation if  the symptoms worsen or if the condition fails to improve as anticipated.    Collaboration of Care: Other provider involved in patient's care AEB 'notes available in epic  Patient/Guardian was advised Release of Information must be obtained prior to any record release in order to collaborate their care with an outside provider. Patient/Guardian was advised if they have not already done so to contact the registration department to sign all necessary forms in order for [place] to release information regarding their care.   Consent: Patient/Guardian gives verbal consent for treatment and assignment of benefits for services provided during this visit. Patient/Guardian expressed understanding and agreed to proceed.    I provided 18 minutes of nonface-to-face time during this encounter   Note: This document was prepared by [employer] voice dictation technology and any errors that results from this process are unintentional.    Cleotis Nipper, MD 10/14/2022

## 2022-10-16 DIAGNOSIS — M25562 Pain in left knee: Secondary | ICD-10-CM | POA: Diagnosis not present

## 2022-10-18 DIAGNOSIS — M25562 Pain in left knee: Secondary | ICD-10-CM | POA: Diagnosis not present

## 2022-10-20 ENCOUNTER — Other Ambulatory Visit: Payer: Self-pay | Admitting: Physician Assistant

## 2022-10-21 ENCOUNTER — Telehealth (HOSPITAL_COMMUNITY): Payer: BC Managed Care – PPO | Admitting: Psychiatry

## 2022-10-23 DIAGNOSIS — M25562 Pain in left knee: Secondary | ICD-10-CM | POA: Diagnosis not present

## 2022-11-06 DIAGNOSIS — M25562 Pain in left knee: Secondary | ICD-10-CM | POA: Diagnosis not present

## 2022-11-12 ENCOUNTER — Ambulatory Visit: Payer: BC Managed Care – PPO | Admitting: Physician Assistant

## 2022-11-12 VITALS — BP 124/80 | HR 97 | Temp 97.7°F | Ht 69.0 in | Wt 240.0 lb

## 2022-11-12 DIAGNOSIS — N904 Leukoplakia of vulva: Secondary | ICD-10-CM | POA: Diagnosis not present

## 2022-11-12 DIAGNOSIS — F330 Major depressive disorder, recurrent, mild: Secondary | ICD-10-CM | POA: Diagnosis not present

## 2022-11-12 MED ORDER — BETAMETHASONE VALERATE 0.1 % EX OINT: TOPICAL_OINTMENT | CUTANEOUS | 1 refills | Status: AC

## 2022-11-12 NOTE — Progress Notes (Signed)
Subjective:    Patient ID: Sandra [name], female    DOB: 1965-10-19, 57 y.o.   MRN: [id number]  Chief Complaint  Patient presents with   Medication Refill    Pt needs refill for betamethasone. No other concerns     Medication Refill   Patient is in today for refill on betamethasone cream for lichen sclerosis flare-up. Previously treated by GYN. Itching, no other symptoms.   Past Medical History:  Diagnosis Date   Breast cancer (HCC) 11/2017   rt.Br.   Depression    Endometriosis, diagnosis via laparoscopy    Family history of breast cancer    Family history of breast cancer    Family history of colon cancer    Family history of prostate cancer    Hyperlipidemia 2017   Lichen sclerosus, vulva    Low serum vitamin D 2017   Personal history of chemotherapy    Personal history of radiation therapy     Past Surgical History:  Procedure Laterality Date   APPENDECTOMY  2005   BREAST BIOPSY Right 11/11/2017   BREAST BIOPSY Right 12/12/2017   x2   BREAST LUMPECTOMY Right 04/23/2018   BREAST LUMPECTOMY WITH RADIOACTIVE SEED AND SENTINEL LYMPH NODE BIOPSY Right 04/23/2018   Procedure: RIGHT BREAST WITH RADIOACTIVE SEED LUMPECTOMY x3 AND SENTINEL LYMPH NODE MAPPING;  Surgeon: Harriette Bouillon, MD;  Location: [facility];  Service: General;  Laterality: Right;   CESAREAN SECTION  2002   MANDIBLE RECONSTRUCTION  1985   wires inside jaw according to pt   PELVIC LAPAROSCOPY  2004   PORT-A-CATH REMOVAL Right 01/27/2019   Procedure: PORT REMOVAL;  Surgeon: Harriette Bouillon, MD;  Location: [facility];  Service: General;  Laterality: Right;   PORTACATH PLACEMENT Right 12/03/2017   Procedure: INSERTION PORT-A-CATH;  Surgeon: Harriette Bouillon, MD;  Location: [facility];  Service: General;  Laterality: Right;   SHIN SURGERY  1998    Family History  Problem Relation Age of Onset   Stroke Mother    Heart disease Mother    Colon cancer Father    Prostate cancer  Father    Hypertension Brother    Cancer Maternal Uncle        NOS   Breast cancer Paternal Aunt        dx in her 11s   Prostate cancer Paternal Uncle    Alcohol abuse Maternal Grandfather    Breast cancer Paternal Grandmother        dx in her 54s   Prostate cancer Paternal Grandfather    SIDS Brother    Cancer Paternal Aunt    Tuberculosis Paternal Uncle     Social History   Tobacco Use   Smoking status: Never   Smokeless tobacco: Never  Vaping Use   Vaping status: Never Used  Substance Use Topics   Alcohol use: No    Alcohol/week: 0.0 standard drinks of alcohol   Drug use: No     No Known Allergies  Review of Systems NEGATIVE UNLESS OTHERWISE INDICATED IN HPI      Objective:     BP 124/80 (BP Location: Left Arm, Patient Position: Sitting, Cuff Size: Normal)   Pulse 97   Temp 97.7 F (36.5 C) (Temporal)   Ht 5\' 9"  (1.753 m)   Wt 240 lb (108.9 kg)   SpO2 97%   BMI 35.44 kg/m   Wt Readings from Last 3 Encounters:  11/12/22 240 lb (108.9 kg)  11/30/21 246  lb 8 oz (111.8 kg)  02/26/21 236 lb (107 kg)    BP Readings from Last 3 Encounters:  11/12/22 124/80  11/30/21 131/79  02/26/21 106/70     Physical Exam Vitals and nursing note reviewed.  Constitutional:      Appearance: Normal appearance.  Genitourinary:    Comments: Deferred  Neurological:     Mental Status: She is alert.  Psychiatric:        Mood and Affect: Mood normal.        Behavior: Behavior normal.        Assessment & Plan:  Lichen sclerosus et atrophicus of the vulva Assessment & Plan: Chronic, stable, previously diagnosed and treated by GYN Refilled cream today.   Orders: -     Betamethasone Valerate; APPLY TWICE A DAY FOR 1-2 WEEKS AS NEEDED FOR FLARE OF LICHEN SCLEROSIS.  Dispense: 15 g; Refill: 1      Return if symptoms worsen or fail to improve.     M , PA-C

## 2022-11-12 NOTE — Assessment & Plan Note (Signed)
Chronic, stable, previously diagnosed and treated by GYN Refilled cream today.

## 2022-11-26 ENCOUNTER — Telehealth: Payer: Self-pay | Admitting: Hematology and Oncology

## 2022-12-03 ENCOUNTER — Inpatient Hospital Stay: Payer: BC Managed Care – PPO | Admitting: Hematology and Oncology

## 2022-12-04 DIAGNOSIS — M25562 Pain in left knee: Secondary | ICD-10-CM | POA: Diagnosis not present

## 2022-12-12 DIAGNOSIS — F330 Major depressive disorder, recurrent, mild: Secondary | ICD-10-CM | POA: Diagnosis not present

## 2022-12-26 DIAGNOSIS — Z77120 Contact with and (suspected) exposure to mold (toxic): Secondary | ICD-10-CM | POA: Diagnosis not present

## 2022-12-26 DIAGNOSIS — R5383 Other fatigue: Secondary | ICD-10-CM | POA: Diagnosis not present

## 2022-12-26 DIAGNOSIS — E669 Obesity, unspecified: Secondary | ICD-10-CM | POA: Diagnosis not present

## 2022-12-26 DIAGNOSIS — E785 Hyperlipidemia, unspecified: Secondary | ICD-10-CM | POA: Diagnosis not present

## 2022-12-31 ENCOUNTER — Inpatient Hospital Stay: Payer: BC Managed Care – PPO | Attending: Hematology and Oncology | Admitting: Hematology and Oncology

## 2022-12-31 ENCOUNTER — Telehealth: Payer: Self-pay

## 2022-12-31 VITALS — BP 139/68 | HR 83 | Temp 97.5°F | Resp 18 | Ht 69.0 in | Wt 236.0 lb

## 2022-12-31 DIAGNOSIS — C50411 Malignant neoplasm of upper-outer quadrant of right female breast: Secondary | ICD-10-CM

## 2022-12-31 DIAGNOSIS — Z780 Asymptomatic menopausal state: Secondary | ICD-10-CM | POA: Diagnosis not present

## 2022-12-31 DIAGNOSIS — Z170 Estrogen receptor positive status [ER+]: Secondary | ICD-10-CM | POA: Diagnosis not present

## 2022-12-31 DIAGNOSIS — Z79811 Long term (current) use of aromatase inhibitors: Secondary | ICD-10-CM | POA: Diagnosis not present

## 2022-12-31 MED ORDER — LETROZOLE 2.5 MG PO TABS: ORAL_TABLET | ORAL | 3 refills | Status: DC

## 2022-12-31 NOTE — Progress Notes (Signed)
Patient Care Team: Allwardt, Crist Infante, PA-C as PCP - General (Physician Assistant) Wynona Canes, MD as Referring Physician (Specialist) Harriette Bouillon, MD as Consulting Physician (General Surgery) Serena Croissant, MD as Consulting Physician (Hematology and Oncology) Dorothy Puffer, MD as Consulting Physician (Radiation Oncology)  DIAGNOSIS:  Encounter Diagnoses  Name Primary?   Malignant neoplasm of upper-outer quadrant of right breast in female, estrogen receptor positive (HCC) Yes   Post-menopausal     SUMMARY OF ONCOLOGIC HISTORY: Oncology History  Malignant neoplasm of upper-outer quadrant of right breast in female, estrogen receptor positive (HCC)  11/11/2017 Initial Diagnosis   Palpable right breast mass at 10 o'clock position 1.7 cm, axilla negative, biopsy revealed grade 3 IDC ER 60%, PR 10%, Ki-67 70%, HER-2 positive, T1 CN 0 stage Ia   11/19/2017 Cancer Staging   Staging form: Breast, AJCC 8th Edition - Clinical: Stage IB (cT2, cN0, cM0, G3, ER+, PR+, HER2+) - Signed by Serena Croissant, MD on 12/04/2017   11/25/2017 Breast MRI   4.4 x 2.8 x 2.1 cm right breast malignancy UOQ, second focus 0.8 cm (biopsy planned for 12/09/2017)   12/04/2017 - 03/26/2018 Neo-Adjuvant Chemotherapy   TCH Perjeta every 3 week x6 followed by Herceptin Perjeta maintenance   01/04/2018 Genetic Testing   Negative genetic testing on the multi-cancer panel.  The Multi-Gene Panel offered by [facility] includes sequencing and/or deletion duplication testing of the following 84 genes: AIP, ALK, APC, ATM, AXIN2,BAP1,  BARD1, BLM, BMPR1A, BRCA1, BRCA2, BRIP1, CASR, CDC73, CDH1, CDK4, CDKN1B, CDKN1C, CDKN2A (p14ARF), CDKN2A (p16INK4a), CEBPA, CHEK2, CTNNA1, DICER1, DIS3L2, EGFR (c.2369C>T, p.Thr790Met variant only), EPCAM (Deletion/duplication testing only), FH, FLCN, GATA2, GPC3, GREM1 (Promoter region deletion/duplication testing only), HOXB13 (c.251G>A, p.Gly84Glu), HRAS, KIT, MAX, MEN1, MET, MITF (c.952G>A,  p.Glu318Lys variant only), MLH1, MSH2, MSH3, MSH6, MUTYH, NBN, NF1, NF2, NTHL1, PALB2, PDGFRA, PHOX2B, PMS2, POLD1, POLE, POT1, PRKAR1A, PTCH1, PTEN, RAD50, RAD51C, RAD51D, RB1, RECQL4, RET, RUNX1, SDHAF2, SDHA (sequence changes only), SDHB, SDHC, SDHD, SMAD4, SMARCA4, SMARCB1, SMARCE1, STK11, SUFU, TERC, TERT, TMEM127, TP53, TSC1, TSC2, VHL, WRN and WT1.  The report date is January 04, 2018.   04/23/2018 Surgery   Right lumpectomy ([facility]) [id number]): Small residual invasive cancer multifocal 0.15 cm, grade 2, 0/3 lymph nodes all negative, ER 90%, PR 60%, HER-2 +3+ by IHC, Ki-67 20%, RCB class I, ympT1a, ypN0   05/07/2018 - 12/03/2018 Chemotherapy   The patient had ado-trastuzumab emtansine (KADCYLA) 300 mg in sodium chloride 0.9 % 250 mL chemo infusion, 3.6 mg/kg = 300 mg, Intravenous, Once, 11 of 11 cycles Dose modification: 2.4 mg/kg (original dose 3.6 mg/kg, Cycle 8, Reason: Dose not tolerated) Administration: 300 mg (05/07/2018), 300 mg (05/28/2018), 300 mg (07/30/2018), 300 mg (08/20/2018), 300 mg (09/10/2018), 200 mg (10/01/2018), 200 mg (10/22/2018), 200 mg (11/12/2018), 200 mg (12/03/2018), 300 mg (06/18/2018), 300 mg (07/09/2018)  for chemotherapy treatment.    06/01/2018 - 07/15/2018 Radiation Therapy   The patient initially received a dose of 50.4 Gy in 28 fractions to the right breast using whole-breast tangent fields. This was delivered using a 3-D conformal technique. The patient then received a boost to the seroma. This delivered an additional 10 Gy in 5 fractions using 6X photons with a Complex Isodose technique. The total dose was 60.4 Gy.   12/2018 - 12/2023 Anti-estrogen oral therapy   Anastrozole     CHIEF COMPLIANT: Follow-up on letrozole therapy  Discussed the use of AI scribe software for clinical note transcription with the patient, who gave verbal consent  to proceed.  History of Present Illness   The patient, a breast cancer survivor, presents for her annual follow-up. She was diagnosed in  2019 and has been on anti-estrogen therapy with letrozole since then. She reports intermittent hot flashes, which are manageable, and no significant joint stiffness. She has been less active than usual due to a recent knee injury.  In July, the patient fractured her patella and possibly tore her meniscus after a fall caused by a large dog. She is recovering well, no longer needing a cane and only wearing a brace in public for added support. She is due for a follow-up x-ray to assess healing.         ALLERGIES:  has No Known Allergies.  MEDICATIONS:  Current Outpatient Medications  Medication Sig Dispense Refill   betamethasone valerate ointment (VALISONE) 0.1 % APPLY TWICE A DAY FOR 1-2 WEEKS AS NEEDED FOR FLARE OF LICHEN SCLEROSIS. 15 g 1   clotrimazole (LOTRIMIN) 1 % cream APPLY TO AFFECTED AREA TWICE A DAY 45 g 0   vortioxetine HBr (TRINTELLIX) 20 MG TABS tablet Take one tab daily 30 tablet 2   letrozole (FEMARA) 2.5 MG tablet TAKE HALF A TABLET BY MOUTH DAILY. 45 tablet 3   No current facility-administered medications for this visit.    PHYSICAL EXAMINATION: ECOG PERFORMANCE STATUS: 1 - Symptomatic but completely ambulatory  Vitals:   12/31/22 0839  BP: 139/68  Pulse: 83  Resp: 18  Temp: (!) 97.5 F (36.4 C)  SpO2: 99%   Filed Weights   12/31/22 0839  Weight: 236 lb (107 kg)      LABORATORY DATA:  I have reviewed the data as listed    Latest Ref Rng & Units 02/26/2021    8:08 AM 12/22/2018    8:30 AM 12/03/2018    9:28 AM  CMP  Glucose 70 - 99 mg/dL 96  88  82   BUN 6 - 23 mg/dL 16  14  17    Creatinine 0.40 - 1.20 mg/dL 7.82  9.56  2.13   Sodium 135 - 145 mEq/L 139  141  139   Potassium 3.5 - 5.1 mEq/L 4.3  4.3  3.8   Chloride 96 - 112 mEq/L 103  103  104   CO2 19 - 32 mEq/L 26  29  26    Calcium 8.4 - 10.5 mg/dL 08.6  9.6  9.4   Total Protein 6.0 - 8.3 g/dL 7.3  7.4  7.6   Total Bilirubin 0.2 - 1.2 mg/dL 0.5  0.4  0.3   Alkaline Phos 39 - 117 U/L 58  68  68    AST 0 - 37 U/L 17  38  29   ALT 0 - 35 U/L 22  38  26     Lab Results  Component Value Date   WBC 4.4 02/26/2021   HGB 13.1 02/26/2021   HCT 38.7 02/26/2021   MCV 91.2 02/26/2021   PLT 256.0 02/26/2021   NEUTROABS 2.4 02/26/2021    ASSESSMENT & PLAN:  Malignant neoplasm of upper-outer quadrant of right breast in female, estrogen receptor positive (HCC) 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%, PR 10%, Ki-67 70%, HER-2 positive, T1 CN 0 stage Ia Breast MRI 11/25/2017: 4.4 x 2.8 x 2.1 cm right breast malignancy UOQ, second focus 0.8 cm (biopsy planned for 12/09/2017), overall extent of disease is 7 cm 12/12/2017:Biopsy results of the MRI  guided biopsies 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  Neoadjuvant chemotherapy with [facility] Perjeta 12/04/2017-03/26/2018 followed by Kadcyla x 1 year completed 12/03/2018 2. 04/23/2018:Right lumpectomy: Small residual invasive cancer multifocal 0.15 cm, grade 2, 0/3 lymph nodes all negative, ER 90%, PR 60%, HER-2 +3+ by IHC, Ki-67 20%, RCB class I, ympT1a, ypN0 3. Radiation therapy: Completed 07/15/2018 4.  Adjuvant antiestrogen therapy started 12/15/2018 -------------------------------------------------------------------------------------------------------------------------------- Current treatment: Anastrozole 1 mg daily stopped 09/21/2019 because of severe fatigue and severe joint pains, switched to letrozole half tablet daily started 11/19/2019   Letrozole toxicities: Muscle aches and pains have disappeared at half a tablet daily Fracture of the colon from a fall related to holding a [place].   Breast cancer surveillance: Mammogram 05/13/2022: No evidence of malignancy breast density category C Bone density 05/11/2021: T score -0.9: Normal  Discussed Signatera testing.     Breast Cancer Patient is 5 years post-diagnosis  and currently on Letrozole 2.5mg  daily. Experiencing manageable hot flashes and joint stiffness. Discussed the benefits of exercise, diet, and alcohol reduction in reducing recurrence risk. -Continue Letrozole 2.5mg  daily. -Plan to stop Letrozole after 7 years of therapy. -Initiate Signatera blood test for early detection of recurrence, to be done every 3 months.  Bone Health Patient had a recent patella fracture due to a fall. Bone density was last checked in 2023 and was good. -Next bone density scan due in February 2025, to be scheduled with mammogram.  General Health Maintenance -Mammogram due in February 2025. -Encourage regular exercise and healthy diet. -Consider Vitamin D supplementation, to be discussed with primary care doctor.  Follow-up in 1 year.          Orders Placed This Encounter  Procedures   DG Bone Density    Standing Status:   Future    Standing Expiration Date:   12/31/2023    Scheduling Instructions:     Please do along with her mammograms    Order Specific Question:   Reason for Exam (SYMPTOM  OR DIAGNOSIS REQUIRED)    Answer:   post menopausal    Order Specific Question:   Is patient pregnant?    Answer:   No    Order Specific Question:   Preferred imaging location?    Answer:   [facility]    Order Specific Question:   Release to patient    Answer:   Immediate   The patient has a good understanding of the overall plan. she agrees with it. she will call with any problems that may develop before the next visit here. Total time spent: 30 mins including face to face time and time spent for planning, charting and co-ordination of care   Tamsen Meek, MD 12/31/22

## 2022-12-31 NOTE — Assessment & Plan Note (Addendum)
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  Neoadjuvant chemotherapy with [facility] 12/04/2017-03/26/2018 followed by [facility] x 1 year completed 12/03/2018 2. 04/23/2018:Right lumpectomy: Small residual invasive cancer multifocal 0.15 cm, grade 2, 0/3 lymph nodes all negative, ER 90%, PR 60%, HER-2 +3+ by IHC, Ki-67 20%, RCB class I, ympT1a, ypN0 3. Radiation therapy: Completed 07/15/2018 4.  Adjuvant antiestrogen therapy started 12/15/2018 -------------------------------------------------------------------------------------------------------------------------------- Current treatment: Anastrozole 1 mg daily stopped 09/21/2019 because of severe fatigue and severe joint pains, switched to letrozole half tablet daily started 11/19/2019   Letrozole toxicities: Muscle aches and pains have disappeared at half a tablet daily Fracture of the colon from a fall related to holding a [place].   Breast cancer surveillance: Mammogram 05/13/2022: No evidence of malignancy breast density category C Bone density 05/11/2021: T score -0.9: Normal  Discussed [employer] testing.  Return to clinic in 1 year for follow-up

## 2022-12-31 NOTE — Addendum Note (Signed)
Addended by: Janan Ridge L on: 12/31/2022 10:09 AM   Modules accepted: Orders

## 2022-12-31 NOTE — Telephone Encounter (Signed)
Per md orders entered for [employer]. Requisition and all supported documents faxed to [phone]. Faxed confirmation was received.  

## 2023-01-03 DIAGNOSIS — M25562 Pain in left knee: Secondary | ICD-10-CM | POA: Diagnosis not present

## 2023-01-09 ENCOUNTER — Other Ambulatory Visit: Payer: Self-pay | Admitting: Hematology and Oncology

## 2023-01-09 DIAGNOSIS — F330 Major depressive disorder, recurrent, mild: Secondary | ICD-10-CM | POA: Diagnosis not present

## 2023-01-09 DIAGNOSIS — Z9889 Other specified postprocedural states: Secondary | ICD-10-CM

## 2023-01-13 ENCOUNTER — Telehealth (HOSPITAL_BASED_OUTPATIENT_CLINIC_OR_DEPARTMENT_OTHER): Payer: BC Managed Care – PPO | Admitting: Psychiatry

## 2023-01-13 ENCOUNTER — Encounter (HOSPITAL_COMMUNITY): Payer: Self-pay | Admitting: Psychiatry

## 2023-01-13 VITALS — Wt 236.0 lb

## 2023-01-13 DIAGNOSIS — F419 Anxiety disorder, unspecified: Secondary | ICD-10-CM | POA: Diagnosis not present

## 2023-01-13 DIAGNOSIS — F330 Major depressive disorder, recurrent, mild: Secondary | ICD-10-CM

## 2023-01-13 MED ORDER — VORTIOXETINE HBR 20 MG PO TABS: ORAL_TABLET | ORAL | 2 refills | Status: DC

## 2023-01-13 NOTE — Progress Notes (Signed)
Health MD Virtual Progress Note   Patient Location: Work Provider Location: Home Office  I connect with patient by video and verified that I am speaking with correct person by using two identifiers. I discussed the limitations of evaluation and management by telemedicine and the availability of in person appointments. I also discussed with the patient that there may be a patient responsible charge related to this service. The patient expressed understanding and agreed to proceed.  Sandra [name] [id number] 57 y.o.  01/13/2023 2:34 PM  History of Present Illness:  Patient is evaluated by video session.  She is taking Trintellix 20 mg.  She reported things are stable and denies any major panic attack, crying spells or any feeling of hopelessness.  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  Her sleep is good.  She is in therapy with Marcelino Duster at [facility] and that seems to be working well.  Recently she had a blood work and her cholesterol is high.  Her doctor tried to put on statin but she decided not to be on a statin because of side effects.  She has no tremors or shakes or any EPS.  Her appetite is okay.  Energy level is okay.  She like to keep the current medication which is working for anxiety and her depression.  Past Psychiatric History: [facility]  Tried Paxil, Lexapro, ativan and trazadone but don't remember.  Lamictal did not help and Wellbutrin caused depression.  We tried Effexor and Prozac but did not help.  No h/o psychosis, suicidal attempt or Inpatient.        Outpatient Encounter Medications as of 01/13/2023  Medication Sig   betamethasone valerate ointment (VALISONE) 0.1 % APPLY TWICE A DAY FOR 1-2 WEEKS AS NEEDED FOR FLARE OF LICHEN SCLEROSIS.   clotrimazole (LOTRIMIN) 1 % cream APPLY TO AFFECTED AREA TWICE A DAY   letrozole (FEMARA) 2.5 MG tablet TAKE HALF A TABLET BY MOUTH DAILY.    vortioxetine HBr (TRINTELLIX) 20 MG TABS tablet Take one tab daily   No facility-administered encounter medications on file as of 01/13/2023.    No results found for this or any previous visit (from the past 2160 hour(s)).   Psychiatric Specialty Exam: Physical Exam  Review of Systems  Weight 236 lb (107 kg).There is no height or weight on file to calculate BMI.  General Appearance: Casual  Eye Contact:  Good  Speech:  Clear and Coherent and Normal Rate  Volume:  Normal  Mood:  Euthymic  Affect:  Congruent  Thought Process:  Goal Directed  Orientation:  Full (Time, Place, and Person)  Thought Content:  WDL  Suicidal Thoughts:  No  Homicidal Thoughts:  No  Memory:  Immediate;   Good Recent;   Good Remote;   Good  Judgement:  Good  Insight:  Good  Psychomotor Activity:  Normal  Concentration:  Concentration: Good and Attention Span: Good  Recall:  Good  Fund of Knowledge:  Good  Language:  Good  Akathisia:  No  Handed:  Right  AIMS (if indicated):     Assets:  Communication Skills Desire for Improvement Housing Social Support Talents/Skills Transportation  ADL's:  Intact  Cognition:  WNL  Sleep:  ok     Assessment/Plan: MDD (major depressive disorder), recurrent episode, mild (HCC) - Plan: vortioxetine HBr (TRINTELLIX) 20 MG TABS tablet  Anxiety - Plan: vortioxetine HBr (TRINTELLIX) 20 MG TABS tablet  Patient is stable and on Trintellix 20 mg daily.  We will continue the current dose.  Recommend to call us back if any question or any concern.  Follow-up in 3 months.   Follow Up Instructions:     I discussed the assessment and treatment plan with the patient. The patient was provided an opportunity to ask questions and all were answered. The patient agreed with the plan and demonstrated an understanding of the instructions.   The patient was advised to call back or seek an in-person evaluation if the symptoms worsen or if the condition fails to improve as  anticipated.    Collaboration of Care: Other provider involved in patient's care AEB notes are available in epic to review  Patient/Guardian was advised Release of Information must be obtained prior to any record release in order to collaborate their care with an outside provider. Patient/Guardian was advised if they have not already done so to contact the registration department to sign all necessary forms in order for [place] to release information regarding their care.   Consent: Patient/Guardian gives verbal consent for treatment and assignment of benefits for services provided during this visit. Patient/Guardian expressed understanding and agreed to proceed.     I provided 15 minutes of non face to face time during this encounter.  Note: This document was prepared by [employer] voice dictation technology and any errors that results from this process are unintentional.    Cleotis Nipper, MD 01/13/2023

## 2023-01-21 DIAGNOSIS — R5383 Other fatigue: Secondary | ICD-10-CM | POA: Diagnosis not present

## 2023-01-21 DIAGNOSIS — E669 Obesity, unspecified: Secondary | ICD-10-CM | POA: Diagnosis not present

## 2023-01-21 DIAGNOSIS — E785 Hyperlipidemia, unspecified: Secondary | ICD-10-CM | POA: Diagnosis not present

## 2023-01-21 DIAGNOSIS — Z77120 Contact with and (suspected) exposure to mold (toxic): Secondary | ICD-10-CM | POA: Diagnosis not present

## 2023-01-27 ENCOUNTER — Telehealth: Payer: Self-pay | Admitting: Medical Oncology

## 2023-01-27 NOTE — Telephone Encounter (Signed)
WF [id number] Understanding and Predicting Breast Cancer Events after Treatment (UPBEAT)   Five Year Follow-up  Call to patient regarding five year follow-up on study. Patient reports to be doing well and confirms no hospitalizations for cardiovascular events or having any cardiovascular events in the past year which include; MI, PCI, CABG, Cath, Stroke, or HF. Patient informed that study questionnaires were sent to her email to complete for this follow-up.  Patient denies having any questions at this time. Patient thanked for her time and continued support of study. Patient knows to expect next study follow-up call in approximately one years time.   Rexene Edison, RN, BSN, CCRC Clinical Research Nurse Lead 01/27/2023 3:04 PM

## 2023-01-29 ENCOUNTER — Ambulatory Visit: Payer: BC Managed Care – PPO | Admitting: Physician Assistant

## 2023-01-29 ENCOUNTER — Encounter: Payer: Self-pay | Admitting: Physician Assistant

## 2023-01-29 VITALS — BP 108/68 | HR 73 | Temp 97.8°F | Ht 69.0 in | Wt 234.6 lb

## 2023-01-29 DIAGNOSIS — R194 Change in bowel habit: Secondary | ICD-10-CM | POA: Insufficient documentation

## 2023-01-29 DIAGNOSIS — Z23 Encounter for immunization: Secondary | ICD-10-CM | POA: Diagnosis not present

## 2023-01-29 DIAGNOSIS — Z8639 Personal history of other endocrine, nutritional and metabolic disease: Secondary | ICD-10-CM

## 2023-01-29 DIAGNOSIS — E782 Mixed hyperlipidemia: Secondary | ICD-10-CM | POA: Diagnosis not present

## 2023-01-29 DIAGNOSIS — Z0000 Encounter for general adult medical examination without abnormal findings: Secondary | ICD-10-CM

## 2023-01-29 DIAGNOSIS — Z0001 Encounter for general adult medical examination with abnormal findings: Secondary | ICD-10-CM | POA: Diagnosis not present

## 2023-01-29 DIAGNOSIS — R195 Other fecal abnormalities: Secondary | ICD-10-CM | POA: Insufficient documentation

## 2023-01-29 DIAGNOSIS — E7800 Pure hypercholesterolemia, unspecified: Secondary | ICD-10-CM

## 2023-01-29 DIAGNOSIS — Z800 Family history of malignant neoplasm of digestive organs: Secondary | ICD-10-CM | POA: Insufficient documentation

## 2023-01-29 DIAGNOSIS — E663 Overweight: Secondary | ICD-10-CM | POA: Insufficient documentation

## 2023-01-29 DIAGNOSIS — L309 Dermatitis, unspecified: Secondary | ICD-10-CM | POA: Insufficient documentation

## 2023-01-29 DIAGNOSIS — R0981 Nasal congestion: Secondary | ICD-10-CM | POA: Insufficient documentation

## 2023-01-29 LAB — CBC WITH DIFFERENTIAL/PLATELET
Basophils Absolute: 0 10*3/uL (ref 0.0–0.1)
Basophils Relative: 0.6 % (ref 0.0–3.0)
Eosinophils Absolute: 0.3 10*3/uL (ref 0.0–0.7)
Eosinophils Relative: 5.4 % — ABNORMAL HIGH (ref 0.0–5.0)
HCT: 41.1 % (ref 36.0–46.0)
Hemoglobin: 13.3 g/dL (ref 12.0–15.0)
Lymphocytes Relative: 29.4 % (ref 12.0–46.0)
Lymphs Abs: 1.4 10*3/uL (ref 0.7–4.0)
MCHC: 32.3 g/dL (ref 30.0–36.0)
MCV: 91.8 fL (ref 78.0–100.0)
Monocytes Absolute: 0.4 10*3/uL (ref 0.1–1.0)
Monocytes Relative: 8.2 % (ref 3.0–12.0)
Neutro Abs: 2.6 10*3/uL (ref 1.4–7.7)
Neutrophils Relative %: 56.4 % (ref 43.0–77.0)
Platelets: 264 10*3/uL (ref 150.0–400.0)
RBC: 4.47 Mil/uL (ref 3.87–5.11)
RDW: 13.9 % (ref 11.5–15.5)
WBC: 4.7 10*3/uL (ref 4.0–10.5)

## 2023-01-29 LAB — COMPREHENSIVE METABOLIC PANEL
ALT: 22 U/L (ref 0–35)
AST: 17 U/L (ref 0–37)
Albumin: 4.1 g/dL (ref 3.5–5.2)
Alkaline Phosphatase: 59 U/L (ref 39–117)
BUN: 11 mg/dL (ref 6–23)
CO2: 29 meq/L (ref 19–32)
Calcium: 9.5 mg/dL (ref 8.4–10.5)
Chloride: 104 meq/L (ref 96–112)
Creatinine, Ser: 1.03 mg/dL (ref 0.40–1.20)
GFR: 60.78 mL/min (ref >60.00)
Glucose, Bld: 90 mg/dL (ref 70–99)
Potassium: 4.5 meq/L (ref 3.5–5.1)
Sodium: 139 meq/L (ref 135–145)
Total Bilirubin: 0.5 mg/dL (ref 0.2–1.2)
Total Protein: 7.3 g/dL (ref 6.0–8.3)

## 2023-01-29 LAB — HEMOGLOBIN A1C: Hgb A1c MFr Bld: 5.8 % (ref 4.6–6.5)

## 2023-01-29 NOTE — Progress Notes (Signed)
Subjective:    Patient ID: Sandra [name], female    DOB: March 20, 1966, 57 y.o.   MRN: [id number]  Chief Complaint  Patient presents with   Annual Exam    Pt in office for annual CPE and fasting labs;     HPI Patient is in today for annual CPE.  Discussed the use of AI scribe software for clinical note transcription with the patient, who gave verbal consent to proceed.  History of Present Illness   The patient, with a known history of high cholesterol, presents for annual CPE.  She reports that her cholesterol levels have been consistently high, with recent tests showing an increase from 240 to 260. She mentions a family history of heart conditions, with her mother having a stroke at 36 and dying of a heart attack at 69. She also mentions a skin condition on the R side of her neck, occasionally white material can get squeezed out. The patient reports increased stress levels in the past week, which she believes may have contributed to her recent health issues. She is scheduled for a mammogram and colonoscopy, and is considering a CT cardiac score.      Past Medical History:  Diagnosis Date   Breast cancer (HCC) 11/2017   rt.Br.   Depression    Endometriosis, diagnosis via laparoscopy    Family history of breast cancer    Family history of breast cancer    Family history of colon cancer    Family history of prostate cancer    Hyperlipidemia 2017   Lichen sclerosus, vulva    Low serum vitamin D 2017   Personal history of chemotherapy    Personal history of radiation therapy     Past Surgical History:  Procedure Laterality Date   APPENDECTOMY  2005   BREAST BIOPSY Right 11/11/2017   BREAST BIOPSY Right 12/12/2017   x2   BREAST LUMPECTOMY Right 04/23/2018   BREAST LUMPECTOMY WITH RADIOACTIVE SEED AND SENTINEL LYMPH NODE BIOPSY Right 04/23/2018   Procedure: RIGHT BREAST WITH RADIOACTIVE SEED LUMPECTOMY x3 AND SENTINEL LYMPH NODE MAPPING;  Surgeon: Harriette Bouillon, MD;   Location: [facility] OR;  Service: General;  Laterality: Right;   CESAREAN SECTION  2002   MANDIBLE RECONSTRUCTION  1985   wires inside jaw according to pt   PELVIC LAPAROSCOPY  2004   PORT-A-CATH REMOVAL Right 01/27/2019   Procedure: PORT REMOVAL;  Surgeon: Harriette Bouillon, MD;  Location: [facility];  Service: General;  Laterality: Right;   PORTACATH PLACEMENT Right 12/03/2017   Procedure: INSERTION PORT-A-CATH;  Surgeon: Harriette Bouillon, MD;  Location: [facility];  Service: General;  Laterality: Right;   SHIN SURGERY  1998    Family History  Problem Relation Age of Onset   Stroke Mother 75       smoking history   Heart disease Mother 77   Colon cancer Father    Prostate cancer Father    Hypertension Brother    SIDS Brother    Alcohol abuse Maternal Grandfather    Breast cancer Paternal Grandmother        dx in her 35s   Prostate cancer Paternal Grandfather    Cancer Maternal Uncle        NOS   Breast cancer Paternal Aunt        dx in her 84s   Cancer Paternal Aunt    Prostate cancer Paternal Uncle    Tuberculosis Paternal Uncle     Social History  Tobacco Use   Smoking status: Never   Smokeless tobacco: Never  Vaping Use   Vaping status: Never Used  Substance Use Topics   Alcohol use: No    Alcohol/week: 0.0 standard drinks of alcohol   Drug use: No     No Known Allergies  Review of Systems NEGATIVE UNLESS OTHERWISE INDICATED IN HPI      Objective:     BP 108/68 (BP Location: Left Arm, Patient Position: Sitting)   Pulse 73   Temp 97.8 F (36.6 C) (Temporal)   Ht 5\' 9"  (1.753 m)   Wt 234 lb 9.6 oz (106.4 kg)   LMP  (LMP Unknown)   SpO2 97%   BMI 34.64 kg/m   Wt Readings from Last 3 Encounters:  01/29/23 234 lb 9.6 oz (106.4 kg)  12/31/22 236 lb (107 kg)  11/12/22 240 lb (108.9 kg)    BP Readings from Last 3 Encounters:  01/29/23 108/68  12/31/22 139/68  11/12/22 124/80     Physical Exam Vitals and nursing note  reviewed.  Constitutional:      Appearance: Normal appearance. She is obese. She is not toxic-appearing.  HENT:     Head: Normocephalic and atraumatic.     Right Ear: Tympanic membrane, ear canal and external ear normal.     Left Ear: Tympanic membrane, ear canal and external ear normal.     Nose: Nose normal.     Mouth/Throat:     Mouth: Mucous membranes are moist.  Eyes:     Extraocular Movements: Extraocular movements intact.     Conjunctiva/sclera: Conjunctivae normal.     Pupils: Pupils are equal, round, and reactive to light.  Cardiovascular:     Rate and Rhythm: Normal rate and regular rhythm.     Pulses: Normal pulses.     Heart sounds: Normal heart sounds.  Pulmonary:     Effort: Pulmonary effort is normal.     Breath sounds: Normal breath sounds.  Abdominal:     General: Abdomen is flat. Bowel sounds are normal.     Palpations: Abdomen is soft.  Musculoskeletal:        General: Normal range of motion.     Cervical back: Normal range of motion and neck supple.  Skin:    General: Skin is warm and dry.     Findings: Lesion (blackhead R neck; hemangioma L shoulder) present.  Neurological:     General: No focal deficit present.     Mental Status: She is alert and oriented to person, place, and time.  Psychiatric:        Mood and Affect: Mood normal.        Behavior: Behavior normal.        Thought Content: Thought content normal.        Judgment: Judgment normal.        Assessment & Plan:  Encounter for annual physical exam -     CBC with Differential/Platelet -     Comprehensive metabolic panel -     Hemoglobin A1c  History of vitamin D deficiency  Immunization due -     Flu vaccine trivalent PF, 6mos and older(Flulaval,Afluria,Fluarix,Fluzone)  Mixed hyperlipidemia -     CT CARDIAC SCORING ([facility] LOCATIONS ONLY); Future  Elevated LDL cholesterol level -     CT CARDIAC SCORING ([facility] LOCATIONS ONLY); Future   Age-appropriate screening and counseling  performed today. Will check labs and call with results. Preventive measures discussed and printed in AVS for  patient.      Hyperlipidemia High LDL-P and LDL-C levels - recently done by [facility], scanned into chart, indicating increased risk for cardiovascular events. Family history of stroke and heart attack. Patient acknowledges poor diet and lack of exercise. -Order CT cardiac score to assess plaque buildup in coronary arteries. -Consider Repatha; pt does not want to try statins due to concern about risks  Family history of stroke Mother had a stroke at 49 and died of a heart attack at 19. Patient is aware of the increased risk. -Monitor closely due to family history.   Pre-diabetes A1c levels have been stable at 5.8-5.9%, but need to prevent progression into the 6% range. Patient's other provider at [facility] has recommended [employer] for blood sugar monitoring. -Check A1c today to monitor for changes. -Encourage patient to follow through with [employer] as recommended by other provider.  General Health Maintenance -Administered flu shot today. -Order labs including CBC, CMP, and A1c. -Continue monitoring blood pressure. -Encourage continued improvements in diet and exercise.       Tasheka Houseman M Romaine Maciolek, PA-C

## 2023-01-30 ENCOUNTER — Encounter: Payer: Self-pay | Admitting: Physician Assistant

## 2023-01-30 NOTE — Telephone Encounter (Signed)
Please see pt questions regarding lab levels

## 2023-02-04 ENCOUNTER — Encounter: Payer: Self-pay | Admitting: *Deleted

## 2023-02-04 NOTE — Progress Notes (Signed)
Receive a message from [employer] representative stating patient did not respond to their call to schedule mobile phlebotomy draw.  Representative states patient will need to reach out to [employer] directly at [phone] option #1 [phone] to schedule if patient wishes to proceed.   

## 2023-02-05 ENCOUNTER — Inpatient Hospital Stay
Admission: RE | Admit: 2023-02-05 | Discharge: 2023-02-05 | Discharge status: Home / Self Care | Payer: BC Managed Care – PPO | Source: Ambulatory Visit | Attending: Physician Assistant

## 2023-02-05 DIAGNOSIS — E782 Mixed hyperlipidemia: Secondary | ICD-10-CM

## 2023-02-05 DIAGNOSIS — E785 Hyperlipidemia, unspecified: Secondary | ICD-10-CM | POA: Diagnosis not present

## 2023-02-05 DIAGNOSIS — E7800 Pure hypercholesterolemia, unspecified: Secondary | ICD-10-CM

## 2023-02-13 DIAGNOSIS — Z7182 Exercise counseling: Secondary | ICD-10-CM | POA: Diagnosis not present

## 2023-02-13 DIAGNOSIS — E66811 Obesity, class 1: Secondary | ICD-10-CM | POA: Diagnosis not present

## 2023-02-13 DIAGNOSIS — Z713 Dietary counseling and surveillance: Secondary | ICD-10-CM | POA: Diagnosis not present

## 2023-02-13 DIAGNOSIS — Z6833 Body mass index (BMI) 33.0-33.9, adult: Secondary | ICD-10-CM | POA: Diagnosis not present

## 2023-02-13 DIAGNOSIS — Z1331 Encounter for screening for depression: Secondary | ICD-10-CM | POA: Diagnosis not present

## 2023-02-17 DIAGNOSIS — F330 Major depressive disorder, recurrent, mild: Secondary | ICD-10-CM | POA: Diagnosis not present

## 2023-02-24 DIAGNOSIS — E7800 Pure hypercholesterolemia, unspecified: Secondary | ICD-10-CM | POA: Diagnosis not present

## 2023-02-24 DIAGNOSIS — Z713 Dietary counseling and surveillance: Secondary | ICD-10-CM | POA: Diagnosis not present

## 2023-02-24 DIAGNOSIS — E6609 Other obesity due to excess calories: Secondary | ICD-10-CM | POA: Diagnosis not present

## 2023-02-24 DIAGNOSIS — E66811 Obesity, class 1: Secondary | ICD-10-CM | POA: Diagnosis not present

## 2023-03-01 ENCOUNTER — Encounter: Payer: Self-pay | Admitting: Physician Assistant

## 2023-03-03 NOTE — Telephone Encounter (Signed)
Spoke with Diane at Radiology and she advised would have this imaging read STAT since it was an IT issue as why it was still sitting waiting to be read.

## 2023-03-20 DIAGNOSIS — F330 Major depressive disorder, recurrent, mild: Secondary | ICD-10-CM | POA: Diagnosis not present

## 2023-03-21 ENCOUNTER — Ambulatory Visit: Payer: BC Managed Care – PPO | Admitting: Family

## 2023-03-21 ENCOUNTER — Encounter: Payer: Self-pay | Admitting: Family

## 2023-03-21 VITALS — BP 133/84 | HR 82 | Temp 97.5°F | Ht 69.0 in | Wt 238.0 lb

## 2023-03-21 DIAGNOSIS — J9801 Acute bronchospasm: Secondary | ICD-10-CM | POA: Diagnosis not present

## 2023-03-21 DIAGNOSIS — B349 Viral infection, unspecified: Secondary | ICD-10-CM

## 2023-03-21 DIAGNOSIS — J069 Acute upper respiratory infection, unspecified: Secondary | ICD-10-CM

## 2023-03-21 MED ORDER — ALBUTEROL SULFATE HFA 108 (90 BASE) MCG/ACT IN AERS: 2.0000 | INHALATION_SPRAY | Freq: Four times a day (QID) | RESPIRATORY_TRACT | 0 refills | Status: DC | PRN

## 2023-03-21 NOTE — Progress Notes (Signed)
Patient ID: Sandra [name], female    DOB: April 10, 1965, 56 y.o.   MRN: [id number]  Chief Complaint  Patient presents with   Sinus Problem    Pt c/o Cough with wheezing and Nasal/chest congestion, Present since Monday. Has tried OTC mucinex and Nyquil/Dayquil.       Discussed the use of AI scribe software for clinical note transcription with the patient, who gave verbal consent to proceed.  History of Present Illness   The patient presents with a week-long history of cough and chest congestion. The cough is productive with clear to yellow sputum. She also reports sinus symptoms, but notes that the chest symptoms seem more prominent now. The symptoms are persistent throughout the day and night. She has been using DayQuil and NightQuil with minimal relief. She reports audible wheezing, especially at night, but denies any history of asthma or bronchitis. She has not had a fever. She has not been using a humidifier. She also reports using Flonase for occasional spring allergies.     Assessment & Plan:     Upper Respiratory Infection - Persistent cough and sinus symptoms with clear to yellow mucus production. No fever. Inspiratory wheezing noted on exam.  -Start Albuterol inhaler, 2 puffs TID, advised on use & SE. -Restart Flonase for sinus symptoms, 1 squirt each side bid x 3d, then daily until sx resolve. -Use humidifier overnight. -Can continue Nyquil qhs. -Encouraged hydration and use of ibuprofen 600mg  tid prn or 2 Aleve bid for body aches, fever, sinus pressure. -RTO precautions provided.     Subjective:    Outpatient Medications Prior to Visit  Medication Sig Dispense Refill   betamethasone valerate ointment (VALISONE) 0.1 % APPLY TWICE A DAY FOR 1-2 WEEKS AS NEEDED FOR FLARE OF LICHEN SCLEROSIS. 15 g 1   Cholecalciferol 125 MCG (5000 UT) TABS 1 tablet Orally Once a day     clotrimazole (LOTRIMIN) 1 % cream APPLY TO AFFECTED AREA TWICE A DAY 45 g 0   co-enzyme Q-10 30 MG capsule  Take 30 mg by mouth 3 (three) times daily.     letrozole (FEMARA) 2.5 MG tablet TAKE HALF A TABLET BY MOUTH DAILY. 45 tablet 3   magnesium oxide (MAG-OX) 400 (240 Mg) MG tablet Take 400 mg by mouth daily.     Turmeric (QC TUMERIC COMPLEX PO) Take by mouth.     vortioxetine HBr (TRINTELLIX) 20 MG TABS tablet Take one tab daily 30 tablet 2   No facility-administered medications prior to visit.   Past Medical History:  Diagnosis Date   Breast cancer (HCC) 11/2017   rt.Br.   Depression    Endometriosis, diagnosis via laparoscopy    Family history of breast cancer    Family history of breast cancer    Family history of colon cancer    Family history of prostate cancer    Hyperlipidemia 2017   Lichen sclerosus, vulva    Low serum vitamin D 2017   Personal history of chemotherapy    Personal history of radiation therapy    Past Surgical History:  Procedure Laterality Date   APPENDECTOMY  2005   BREAST BIOPSY Right 11/11/2017   BREAST BIOPSY Right 12/12/2017   x2   BREAST LUMPECTOMY Right 04/23/2018   BREAST LUMPECTOMY WITH RADIOACTIVE SEED AND SENTINEL LYMPH NODE BIOPSY Right 04/23/2018   Procedure: RIGHT BREAST WITH RADIOACTIVE SEED LUMPECTOMY x3 AND SENTINEL LYMPH NODE MAPPING;  Surgeon: Harriette Bouillon, MD;  Location: [facility];  Service: General;  Laterality: Right;   CESAREAN SECTION  2002   MANDIBLE RECONSTRUCTION  1985   wires inside jaw according to pt   PELVIC LAPAROSCOPY  2004   PORT-A-CATH REMOVAL Right 01/27/2019   Procedure: PORT REMOVAL;  Surgeon: Harriette Bouillon, MD;  Location: [facility];  Service: General;  Laterality: Right;   PORTACATH PLACEMENT Right 12/03/2017   Procedure: INSERTION PORT-A-CATH;  Surgeon: Harriette Bouillon, MD;  Location: [facility];  Service: General;  Laterality: Right;   SHIN SURGERY  1998   No Known Allergies    Objective:    Physical Exam Vitals and nursing note reviewed.  Constitutional:      Appearance:  Normal appearance. She is ill-appearing.     Interventions: Face mask in place.  HENT:     Right Ear: Tympanic membrane and ear canal normal.     Left Ear: Tympanic membrane and ear canal normal.     Nose:     Right Sinus: No frontal sinus tenderness.     Left Sinus: No frontal sinus tenderness.     Mouth/Throat:     Mouth: Mucous membranes are moist.     Pharynx: Posterior oropharyngeal erythema (mild) and postnasal drip present. No pharyngeal swelling, oropharyngeal exudate or uvula swelling.     Tonsils: Tonsillar abscess present. No tonsillar exudate.  Cardiovascular:     Rate and Rhythm: Normal rate and regular rhythm.  Pulmonary:     Effort: Pulmonary effort is normal.     Breath sounds: Examination of the right-upper field reveals wheezing. Examination of the left-upper field reveals wheezing. Examination of the right-middle field reveals wheezing. Examination of the left-middle field reveals wheezing. Wheezing (inspiratory) present.  Musculoskeletal:        General: Normal range of motion.  Lymphadenopathy:     Head:     Right side of head: No preauricular or posterior auricular adenopathy.     Left side of head: No preauricular or posterior auricular adenopathy.     Cervical: No cervical adenopathy.  Skin:    General: Skin is warm and dry.  Neurological:     Mental Status: She is alert.  Psychiatric:        Mood and Affect: Mood normal.        Behavior: Behavior normal.    BP 133/84   Pulse 82   Temp (!) 97.5 F (36.4 C) (Temporal)   Ht 5\' 9"  (1.753 m)   Wt 238 lb (108 kg)   SpO2 95%   BMI 35.15 kg/m  Wt Readings from Last 3 Encounters:  03/21/23 238 lb (108 kg)  01/29/23 234 lb 9.6 oz (106.4 kg)  12/31/22 236 lb (107 kg)       Dulce Sellar, NP

## 2023-03-24 DIAGNOSIS — E7800 Pure hypercholesterolemia, unspecified: Secondary | ICD-10-CM | POA: Diagnosis not present

## 2023-03-24 DIAGNOSIS — F339 Major depressive disorder, recurrent, unspecified: Secondary | ICD-10-CM | POA: Diagnosis not present

## 2023-03-24 DIAGNOSIS — Z6832 Body mass index (BMI) 32.0-32.9, adult: Secondary | ICD-10-CM | POA: Diagnosis not present

## 2023-04-12 ENCOUNTER — Other Ambulatory Visit: Payer: Self-pay | Admitting: Family

## 2023-04-12 DIAGNOSIS — B349 Viral infection, unspecified: Secondary | ICD-10-CM

## 2023-04-15 ENCOUNTER — Encounter (HOSPITAL_COMMUNITY): Payer: Self-pay | Admitting: Psychiatry

## 2023-04-15 ENCOUNTER — Telehealth (HOSPITAL_COMMUNITY): Payer: BC Managed Care – PPO | Admitting: Psychiatry

## 2023-04-15 VITALS — Wt 232.0 lb

## 2023-04-15 DIAGNOSIS — F330 Major depressive disorder, recurrent, mild: Secondary | ICD-10-CM

## 2023-04-15 DIAGNOSIS — F419 Anxiety disorder, unspecified: Secondary | ICD-10-CM | POA: Diagnosis not present

## 2023-04-15 MED ORDER — VORTIOXETINE HBR 20 MG PO TABS: ORAL_TABLET | ORAL | 2 refills | Status: DC

## 2023-04-15 NOTE — Progress Notes (Signed)
 [facility] MD Virtual Progress Note   Patient Location: Work Provider Location: Home Office  I connect with patient by telephone and verified that I am speaking with correct person by using two identifiers. I discussed the limitations of evaluation and management by telemedicine and the availability of in person appointments. I also discussed with the patient that there may be a patient responsible charge related to this service. The patient expressed understanding and agreed to proceed.  Sandra [name] [id number] 58 y.o.  04/15/2023 9:33 AM  History of Present Illness:  Patient is evaluated by phone session.  She is at work and cannot do video.  She reported things are going well and she is taking her medication that is helping her depression and anxiety.  She denies any major panic attack or crying spells.  She reported had 1 episode when she was very scared when she thought about her cancer even though she is cancer free for past 5 years.  She reported her family life is good.  Her Christmas was quite.  She has some challenges at work but overall it is manageable.  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  She is in therapy with Rosaline at [facility] and that is going well.  Her appetite is okay.  She is sleeping okay.  She has no tremors or shakes or any EPS.  She likely continue Trintellix .  Past Psychiatric History: [facility]  Tried Paxil , Lexapro, ativan  and trazadone but don't remember.  Lamictal did not help and Wellbutrin caused depression.  We tried Effexor  and Prozac  but did not help.  No h/o psychosis, suicidal attempt or Inpatient.      Outpatient Encounter Medications as of 04/15/2023  Medication Sig   albuterol  (VENTOLIN  HFA) 108 (90 Base) MCG/ACT inhaler Inhale 2 puffs into the lungs every 6 (six) hours as needed for wheezing or shortness of breath.   betamethasone  valerate ointment (VALISONE ) 0.1 % APPLY TWICE A DAY FOR 1-2 WEEKS AS  NEEDED FOR FLARE OF LICHEN SCLEROSIS.   Cholecalciferol 125 MCG (5000 UT) TABS 1 tablet Orally Once a day   clotrimazole  (LOTRIMIN ) 1 % cream APPLY TO AFFECTED AREA TWICE A DAY   co-enzyme Q-10 30 MG capsule Take 30 mg by mouth 3 (three) times daily.   letrozole  (FEMARA ) 2.5 MG tablet TAKE HALF A TABLET BY MOUTH DAILY.   magnesium oxide (MAG-OX) 400 (240 Mg) MG tablet Take 400 mg by mouth daily.   Turmeric (QC TUMERIC COMPLEX PO) Take by mouth.   vortioxetine  HBr (TRINTELLIX ) 20 MG TABS tablet Take one tab daily   No facility-administered encounter medications on file as of 04/15/2023.    Recent Results (from the past 2160 hours)  CBC with Differential/Platelet     Status: Abnormal   Collection Time: 01/29/23 10:08 AM  Result Value Ref Range   WBC 4.7 4.0 - 10.5 K/uL   RBC 4.47 3.87 - 5.11 Mil/uL   Hemoglobin 13.3 12.0 - 15.0 g/dL   HCT 58.8 63.9 - 53.9 %   MCV 91.8 78.0 - 100.0 fl   MCHC 32.3 30.0 - 36.0 g/dL   RDW 86.0 88.4 - 84.4 %   Platelets 264.0 150.0 - 400.0 K/uL   Neutrophils Relative % 56.4 43.0 - 77.0 %   Lymphocytes Relative 29.4 12.0 - 46.0 %   Monocytes Relative 8.2 3.0 - 12.0 %   Eosinophils Relative 5.4 (H) 0.0 - 5.0 %   Basophils Relative 0.6 0.0 -  3.0 %   Neutro Abs 2.6 1.4 - 7.7 K/uL   Lymphs Abs 1.4 0.7 - 4.0 K/uL   Monocytes Absolute 0.4 0.1 - 1.0 K/uL   Eosinophils Absolute 0.3 0.0 - 0.7 K/uL   Basophils Absolute 0.0 0.0 - 0.1 K/uL  Comprehensive metabolic panel     Status: None   Collection Time: 01/29/23 10:08 AM  Result Value Ref Range   Sodium 139 135 - 145 mEq/L   Potassium 4.5 3.5 - 5.1 mEq/L   Chloride 104 96 - 112 mEq/L   CO2 29 19 - 32 mEq/L   Glucose, Bld 90 70 - 99 mg/dL   BUN 11 6 - 23 mg/dL   Creatinine, Ser 8.96 0.40 - 1.20 mg/dL   Total Bilirubin 0.5 0.2 - 1.2 mg/dL   Alkaline Phosphatase 59 39 - 117 U/L   AST 17 0 - 37 U/L   ALT 22 0 - 35 U/L   Total Protein 7.3 6.0 - 8.3 g/dL   Albumin 4.1 3.5 - 5.2 g/dL   GFR 39.21 >39.99 mL/min     Comment: Calculated using the CKD-EPI Creatinine Equation (2021)   Calcium 9.5 8.4 - 10.5 mg/dL  Hemoglobin J8r     Status: None   Collection Time: 01/29/23 10:08 AM  Result Value Ref Range   Hgb A1c MFr Bld 5.8 4.6 - 6.5 %    Comment: Glycemic Control Guidelines for People with Diabetes:Non Diabetic:  <6%Goal of Therapy: <7%Additional Action Suggested:  >8%      Psychiatric Specialty Exam: Physical Exam  Review of Systems  Weight 232 lb (105.2 kg).There is no height or weight on file to calculate BMI.  General Appearance: NA  Eye Contact:  NA  Speech:  Clear and Coherent and Normal Rate  Volume:  Normal  Mood:  Euthymic  Affect:  NA  Thought Process:  Goal Directed  Orientation:  Full (Time, Place, and Person)  Thought Content:  WDL  Suicidal Thoughts:  No  Homicidal Thoughts:  No  Memory:  Immediate;   Good Recent;   Good Remote;   Good  Judgement:  Good  Insight:  Good  Psychomotor Activity:  Normal  Concentration:  Concentration: Good and Attention Span: Good  Recall:  Good  Fund of Knowledge:  Good  Language:  Good  Akathisia:  No  Handed:  Right  AIMS (if indicated):     Assets:  Communication Skills Desire for Improvement Housing Resilience Social Support Talents/Skills Transportation  ADL's:  Intact  Cognition:  WNL  Sleep:  ok     Assessment/Plan: MDD (major depressive disorder), recurrent episode, mild (HCC) - Plan: vortioxetine  HBr (TRINTELLIX ) 20 MG TABS tablet  Anxiety - Plan: vortioxetine  HBr (TRINTELLIX ) 20 MG TABS tablet  Patient is stable on current medication.  Continue Trintellix  20 mg daily.  Recommended to call us  back if she has any question or any concern.  Follow-up in 3 months.   Follow Up Instructions:     I discussed the assessment and treatment plan with the patient. The patient was provided an opportunity to ask questions and all were answered. The patient agreed with the plan and demonstrated an understanding of the  instructions.   The patient was advised to call back or seek an in-person evaluation if the symptoms worsen or if the condition fails to improve as anticipated.    Collaboration of Care: Other provider involved in patient's care AEB notes are available in epic to review  Patient/Guardian was advised  Release of Information must be obtained prior to any record release in order to collaborate their care with an outside provider. Patient/Guardian was advised if they have not already done so to contact the registration department to sign all necessary forms in order for us  to release information regarding their care.   Consent: Patient/Guardian gives verbal consent for treatment and assignment of benefits for services provided during this visit. Patient/Guardian expressed understanding and agreed to proceed.     I provided 18 minutes of non face to face time during this encounter.  Note: This document was prepared by [employer] voice dictation technology and any errors that results from this process are unintentional.    Leni ONEIDA Client, MD 04/15/2023

## 2023-05-08 DIAGNOSIS — F330 Major depressive disorder, recurrent, mild: Secondary | ICD-10-CM | POA: Diagnosis not present

## 2023-05-16 ENCOUNTER — Ambulatory Visit
Admission: RE | Admit: 2023-05-16 | Discharge: 2023-05-16 | Disposition: A | Discharge status: Home / Self Care | Payer: BC Managed Care – PPO | Source: Ambulatory Visit | Attending: Hematology and Oncology | Admitting: Hematology and Oncology

## 2023-05-16 DIAGNOSIS — Z9889 Other specified postprocedural states: Secondary | ICD-10-CM

## 2023-05-16 DIAGNOSIS — Z853 Personal history of malignant neoplasm of breast: Secondary | ICD-10-CM | POA: Diagnosis not present

## 2023-06-05 DIAGNOSIS — F330 Major depressive disorder, recurrent, mild: Secondary | ICD-10-CM | POA: Diagnosis not present

## 2023-07-23 DIAGNOSIS — F330 Major depressive disorder, recurrent, mild: Secondary | ICD-10-CM | POA: Diagnosis not present

## 2023-08-06 ENCOUNTER — Ambulatory Visit
Admission: RE | Admit: 2023-08-06 | Discharge: 2023-08-06 | Disposition: A | Discharge status: Home / Self Care | Payer: BC Managed Care – PPO | Source: Ambulatory Visit | Attending: Hematology and Oncology | Admitting: Hematology and Oncology

## 2023-08-06 DIAGNOSIS — Z780 Asymptomatic menopausal state: Secondary | ICD-10-CM

## 2023-08-06 DIAGNOSIS — M8588 Other specified disorders of bone density and structure, other site: Secondary | ICD-10-CM | POA: Diagnosis not present

## 2023-08-06 DIAGNOSIS — N958 Other specified menopausal and perimenopausal disorders: Secondary | ICD-10-CM | POA: Diagnosis not present

## 2023-08-29 DIAGNOSIS — R059 Cough, unspecified: Secondary | ICD-10-CM | POA: Diagnosis not present

## 2023-08-29 DIAGNOSIS — Z20822 Contact with and (suspected) exposure to covid-19: Secondary | ICD-10-CM | POA: Diagnosis not present

## 2023-08-29 DIAGNOSIS — J069 Acute upper respiratory infection, unspecified: Secondary | ICD-10-CM | POA: Diagnosis not present

## 2023-09-01 ENCOUNTER — Telehealth: Admitting: Nurse Practitioner

## 2023-09-01 DIAGNOSIS — J40 Bronchitis, not specified as acute or chronic: Secondary | ICD-10-CM

## 2023-09-01 NOTE — Progress Notes (Signed)
 Virtual Visit Consent   Sandra [name], you are scheduled for a virtual visit with a [facility] provider today. Just as with appointments in the office, your consent must be obtained to participate. Your consent will be active for this visit and any virtual visit you may have with one of our providers in the next 365 days. If you have a MyChart account, a copy of this consent can be sent to you electronically.  As this is a virtual visit, video technology does not allow for your provider to perform a traditional examination. This may limit your provider's ability to fully assess your condition. If your provider identifies any concerns that need to be evaluated in person or the need to arrange testing (such as labs, EKG, etc.), we will make arrangements to do so. Although advances in technology are sophisticated, we cannot ensure that it will always work on either your end or our end. If the connection with a video visit is poor, the visit may have to be switched to a telephone visit. With either a video or telephone visit, we are not always able to ensure that we have a secure connection.  By engaging in this virtual visit, you consent to the provision of healthcare and authorize for your insurance to be billed (if applicable) for the services provided during this visit. Depending on your insurance coverage, you may receive a charge related to this service.  I need to obtain your verbal consent now. Are you willing to proceed with your visit today? Sandra [name] has provided verbal consent on 09/01/2023 for a virtual visit (video or telephone). Mardene Shake, FNP  Date: 09/01/2023 5:01 PM   Virtual Visit via Video Note   I, Mardene Shake, connected with  Sandra [name]  ([id number], 12-Mar-1966) on 09/01/23 at  5:00 PM EDT by a video-enabled telemedicine application and verified that I am speaking with the correct person using two identifiers.  Location: Patient: Virtual Visit Location  Patient: Home Provider: Virtual Visit Location Provider: Home Office   I discussed the limitations of evaluation and management by telemedicine and the availability of in person appointments. The patient expressed understanding and agreed to proceed.    History of Present Illness: Sandra [name] is a 58 y.o. who identifies as a female who was assigned female at birth, and is being seen today with obngoing URI symptoms   She started to feel sick 08/26/23 after traveling for work. She initially thought that it was being tired from work. Started to have some body aches on 5/29 and developed a fever on 08/29/23 and went to UC (records not available). She was negative for flu and COVID at that time  She was given doxycyline and Medrol Dose Pak. She is not feeling any better today. Feels she has more wheezing and an increased night time cough. She did try Albuterol  twice   She was sick with URI in December and was given an inhaler at that time for the first time  Of note on Cardiac CT in November of this year with findings : " Mild subpleural nodularity in the right anterior lung immediately deep to the soft tissues of the right breast is felt to most likely relate to prior right breast radiation treatment"  She has a history of breast CA  April 2020 finished radiation   Problems:  Patient Active Problem List   Diagnosis Date Noted   Family history of malignant neoplasm of gastrointestinal tract 01/29/2023  Acute eczema 01/29/2023   Change in bowel habit 01/29/2023   Feces contents abnormal 01/29/2023   Nasal congestion 01/29/2023   Overweight 01/29/2023   Pain in joint of left knee 10/16/2022   Genetic testing 01/05/2018   Family history of prostate cancer    Family history of colon cancer    Port-A-Cath in place 12/10/2017   Malignant neoplasm of upper-outer quadrant of right breast in female, estrogen receptor positive (HCC) 11/14/2017   Melanocytic nevus of trunk 10/23/2017    Other seborrheic keratosis 10/23/2017   Hemangioma of skin and subcutaneous tissue 10/23/2017   Other melanin hyperpigmentation 10/23/2017   Skin mole 04/21/2017   Depression, recurrent (HCC) 04/21/2017   Obesity (BMI 30-39.9) 04/21/2017   Pure hypercholesterolemia 04/21/2017   Vitamin D  deficiency 04/21/2017   Lichen sclerosus et atrophicus of the vulva 04/21/2017   Lack of libido 01/13/2017   Bruxism 01/08/2016   Other and unspecified hyperlipidemia 02/11/2013    Allergies: No Known Allergies Medications:  Current Outpatient Medications:    albuterol  (VENTOLIN  HFA) 108 (90 Base) MCG/ACT inhaler, Inhale 2 puffs into the lungs every 6 (six) hours as needed for wheezing or shortness of breath., Disp: 8 g, Rfl: 0   betamethasone  valerate ointment (VALISONE ) 0.1 %, APPLY TWICE A DAY FOR 1-2 WEEKS AS NEEDED FOR FLARE OF LICHEN SCLEROSIS., Disp: 15 g, Rfl: 1   Cholecalciferol 125 MCG (5000 UT) TABS, 1 tablet Orally Once a day, Disp: , Rfl:    clotrimazole  (LOTRIMIN ) 1 % cream, APPLY TO AFFECTED AREA TWICE A DAY, Disp: 45 g, Rfl: 0   co-enzyme Q-10 30 MG capsule, Take 30 mg by mouth 3 (three) times daily., Disp: , Rfl:    letrozole  (FEMARA ) 2.5 MG tablet, TAKE HALF A TABLET BY MOUTH DAILY., Disp: 45 tablet, Rfl: 3   magnesium oxide (MAG-OX) 400 (240 Mg) MG tablet, Take 400 mg by mouth daily., Disp: , Rfl:    Turmeric (QC TUMERIC COMPLEX PO), Take by mouth., Disp: , Rfl:    vortioxetine  HBr (TRINTELLIX ) 20 MG TABS tablet, Take one tab daily, Disp: 30 tablet, Rfl: 2  Observations/Objective: Patient is well-developed, well-nourished in no acute distress.  Resting comfortably  at home.  Head is normocephalic, atraumatic.  No labored breathing.  Speech is clear and coherent with logical content.  Patient is alert and oriented at baseline.    Assessment and Plan:  1. Bronchitis  Concern for reactive airway onset, Advised follow up with PCP or UC in the next 24 hours for imaging as  discussed  She may continue antibiotic, steroid and start Albuterol  every 4-6 hours for relief  If symptoms worsen/she does not feel relief from Albuterol  or with onset of shortness of breath discussed after hours/ED options     Follow Up Instructions: I discussed the assessment and treatment plan with the patient. The patient was provided an opportunity to ask questions and all were answered. The patient agreed with the plan and demonstrated an understanding of the instructions.  A copy of instructions were sent to the patient via MyChart unless otherwise noted below.    The patient was advised to call back or seek an in-person evaluation if the symptoms worsen or if the condition fails to improve as anticipated.    Mardene Shake, FNP

## 2023-09-02 ENCOUNTER — Encounter: Payer: Self-pay | Admitting: Family

## 2023-09-02 ENCOUNTER — Ambulatory Visit: Admitting: Family

## 2023-09-02 ENCOUNTER — Ambulatory Visit: Payer: Self-pay | Admitting: Family

## 2023-09-02 ENCOUNTER — Ambulatory Visit (INDEPENDENT_AMBULATORY_CARE_PROVIDER_SITE_OTHER)

## 2023-09-02 VITALS — BP 115/78 | HR 81 | Temp 98.4°F | Ht 69.0 in | Wt 229.0 lb

## 2023-09-02 DIAGNOSIS — R053 Chronic cough: Secondary | ICD-10-CM | POA: Diagnosis not present

## 2023-09-02 DIAGNOSIS — R918 Other nonspecific abnormal finding of lung field: Secondary | ICD-10-CM | POA: Diagnosis not present

## 2023-09-02 DIAGNOSIS — R9389 Abnormal findings on diagnostic imaging of other specified body structures: Secondary | ICD-10-CM

## 2023-09-02 MED ORDER — METHYLPREDNISOLONE 4 MG PO TABS: 4.0000 mg | ORAL_TABLET | ORAL | 0 refills | Status: DC

## 2023-09-02 NOTE — Progress Notes (Signed)
 Patient ID: Sandra [name], female    DOB: [birth year], 58 y.o.   MRN: [id number]  Chief Complaint  Patient presents with   Cough    Pt c/o cough, wheezing and lethargic. Present for 1 week. Seen in UC on Friday. Has tried doxycycline and prednisone. Also tried albuterol  and mucinex.   Discussed the use of AI scribe software for clinical note transcription with the patient, who gave verbal consent to proceed.  History of Present Illness Sandra [name] is a 58 year old female with a history of breast cancer who presents with persistent cough and respiratory symptoms.  She has a persistent cough with clear mucus, worsened by wheezing and fatigue. The cough occurs both day and night, with some relief at night from Mucinex. She experiences intermittent fever, with recent episodes despite a recorded temperature of 98.34F. Fatigue and shortness of breath occur with minimal exertion, such as walking or climbing stairs.  She completed a course of doxycycline and a Medrol dose pack, with initial improvement followed by worsening symptoms. She used an inhaler last night and this morning without significant improvement. She has experienced similar respiratory symptoms twice in the last six months.  She underwent radiation therapy on her right side four to five years ago. She has never smoked and has no history of asthma. A CT scan of her heart in November incidentally showed part of her right lung, but it was not a comprehensive lung evaluation.  Assessment & Plan Persistent cough - Persistent productive cough, wheezing, fatigue, and intermittent fever despite doxycycline x 10d and Medrol for 1 week. Increased fatigue and dyspnea with minimal exertion. Differential includes pneumonia, bronchitis, or reactive airway disease. No asthma or smoking history, pt had radiation with right breast cancer 4 years ago.  Concern about respiratory symptoms related to past radiation therapy. Chest X-ray to rule  out nodules or masses.Consider adult-onset asthma if symptoms persist.  - Finish the Doxycyline prescription. - Order chest X-ray to evaluate for pneumonia, bronchitis, or other lung pathology. - Sending new RX for methylprednisolone 4 mg daily for another week, start new RX tomorrow - Encourage increased water intake. - Advise continuation of Mucinex at night for symptomatic relief. - F/U if sx are still not improved by Friday.     Subjective:     Outpatient Medications Prior to Visit  Medication Sig Dispense Refill   albuterol  (VENTOLIN  HFA) 108 (90 Base) MCG/ACT inhaler Inhale 2 puffs into the lungs every 6 (six) hours as needed for wheezing or shortness of breath. 8 g 0   betamethasone  valerate ointment (VALISONE ) 0.1 % APPLY TWICE A DAY FOR 1-2 WEEKS AS NEEDED FOR FLARE OF LICHEN SCLEROSIS. 15 g 1   Cholecalciferol 125 MCG (5000 UT) TABS 1 tablet Orally Once a day     clotrimazole  (LOTRIMIN ) 1 % cream APPLY TO AFFECTED AREA TWICE A DAY 45 g 0   co-enzyme Q-10 30 MG capsule Take 30 mg by mouth 3 (three) times daily.     letrozole  (FEMARA ) 2.5 MG tablet TAKE HALF A TABLET BY MOUTH DAILY. 45 tablet 3   Turmeric (QC TUMERIC COMPLEX PO) Take by mouth.     vortioxetine  HBr (TRINTELLIX ) 20 MG TABS tablet Take one tab daily 30 tablet 2   magnesium oxide (MAG-OX) 400 (240 Mg) MG tablet Take 400 mg by mouth daily.     No facility-administered medications prior to visit.   Past Medical History:  Diagnosis Date   Breast cancer (HCC)  11/2017   rt.Br.   Depression    Endometriosis, diagnosis via laparoscopy    Family history of breast cancer    Family history of breast cancer    Family history of colon cancer    Family history of prostate cancer    Hyperlipidemia 2017   Lichen sclerosus, vulva    Low serum vitamin D  2017   Personal history of chemotherapy    Personal history of radiation therapy    Past Surgical History:  Procedure Laterality Date   APPENDECTOMY  2005   BREAST  BIOPSY Right 11/11/2017   BREAST BIOPSY Right 12/12/2017   x2   BREAST LUMPECTOMY Right 04/23/2018   BREAST LUMPECTOMY WITH RADIOACTIVE SEED AND SENTINEL LYMPH NODE BIOPSY Right 04/23/2018   Procedure: RIGHT BREAST WITH RADIOACTIVE SEED LUMPECTOMY x3 AND SENTINEL LYMPH NODE MAPPING;  Surgeon: Sim Dryer, MD;  Location: [facility] OR;  Service: General;  Laterality: Right;   CESAREAN SECTION  2002   MANDIBLE RECONSTRUCTION  1985   wires inside jaw according to pt   PELVIC LAPAROSCOPY  2004   PORT-A-CATH REMOVAL Right 01/27/2019   Procedure: PORT REMOVAL;  Surgeon: Sim Dryer, MD;  Location: [facility];  Service: General;  Laterality: Right;   PORTACATH PLACEMENT Right 12/03/2017   Procedure: INSERTION PORT-A-CATH;  Surgeon: Sim Dryer, MD;  Location: [facility];  Service: General;  Laterality: Right;   SHIN SURGERY  1998   No Known Allergies    Objective:    Physical Exam Vitals and nursing note reviewed.  Constitutional:      Appearance: Normal appearance.  Cardiovascular:     Rate and Rhythm: Normal rate and regular rhythm.  Pulmonary:     Effort: Pulmonary effort is normal.     Breath sounds: Examination of the right-lower field reveals rhonchi. Rhonchi (w/expiration) present.  Musculoskeletal:        General: Normal range of motion.  Skin:    General: Skin is warm and dry.  Neurological:     Mental Status: She is alert.  Psychiatric:        Mood and Affect: Mood normal.        Behavior: Behavior normal.    BP 115/78 (BP Location: Left Arm, Patient Position: Sitting, Cuff Size: Large)   Pulse 81   Temp 98.4 F (36.9 C) (Temporal)   Ht 5\' 9"  (1.753 m)   Wt 229 lb (103.9 kg)   SpO2 96%   BMI 33.82 kg/m  Wt Readings from Last 3 Encounters:  09/02/23 229 lb (103.9 kg)  03/21/23 238 lb (108 kg)  01/29/23 234 lb 9.6 oz (106.4 kg)      Versa Gore, NP

## 2023-09-03 ENCOUNTER — Ambulatory Visit: Admitting: Student in an Organized Health Care Education/Training Program

## 2023-09-03 ENCOUNTER — Ambulatory Visit: Payer: Self-pay

## 2023-09-03 NOTE — Telephone Encounter (Signed)
 Noted. Nothing further needed.

## 2023-09-03 NOTE — Telephone Encounter (Signed)
 FYI Only or Action Required?: FYI only for provider  Patient wants to be new pt with pulm at [facility] per PCP referral. Called Nurse Triage reporting Cough and Weakness. Symptoms began several days ago. Interventions attempted: Prescription medications: antibx, steroid and Rescue inhaler. Symptoms are: rapidly worsening.  Triage Disposition: Go to ED Now (Notify PCP)  Patient/caregiver understands and will follow disposition?: Unsure - Pt did not confirm or deny intent to go to ED. Nurse canceled [facility] appt today per pt symptoms. Pt requesting to schedule NP appt with [facility].      Copied from CRM [id number]. Topic: Clinical - Red Word Triage >> Sep 03, 2023 11:52 AM Ambrose Junk wrote: Red Word that prompted transfer to Nurse Triage: Persistent cough, wheezing, weakness when standing. Reason for Disposition  [1] MODERATE difficulty breathing (e.g., speaks in phrases, SOB even at rest, pulse 100-120) AND [2] NEW-onset or WORSE than normal  Answer Assessment - Initial Assessment Questions E2C2 Pulmonary Triage - Initial Assessment Questions "Chief Complaint (e.g., cough, sob, wheezing, fever, chills, sweat or additional symptoms) *Go to specific symptom protocol after initial questions. Started about a week ago, went to [facility] Friday night and gave me script, not feeling better by Monday, teledoc Monday night, wheezing started Monday, went to PCP yesterday, did chest x-ray, referred to pulm Weakness upon standing, would walk and be fine yesterday, but today gone to store got dizzy lethargic, now getting up very difficult, weakness upon standing new today, dizziness intermittent No chest pain No experience with this, new inhaler a few months ago Did one inhaler yesterday and one this morning, no difference  9. OTHER SYMPTOMS: "Do you have any other symptoms? (e.g., dizziness, runny nose, cough, chest pain, fever)     Dizziness, weakness upon standing, lethargy/fatigue  Per agent, PCP  just referred her, pt booked herself online for appt today with [facility], does not want [facility] [place], does not want to wait for [facility] appt in August.  Protocols used: Breathing Difficulty-A-AH

## 2023-09-05 ENCOUNTER — Ambulatory Visit: Admitting: Pulmonary Disease

## 2023-09-05 ENCOUNTER — Encounter: Payer: Self-pay | Admitting: Pulmonary Disease

## 2023-09-05 VITALS — BP 104/64 | HR 73 | Temp 98.4°F | Ht 70.0 in | Wt 229.0 lb

## 2023-09-05 DIAGNOSIS — J45909 Unspecified asthma, uncomplicated: Secondary | ICD-10-CM | POA: Diagnosis not present

## 2023-09-05 DIAGNOSIS — D721 Eosinophilia, unspecified: Secondary | ICD-10-CM | POA: Diagnosis not present

## 2023-09-05 DIAGNOSIS — R059 Cough, unspecified: Secondary | ICD-10-CM

## 2023-09-05 DIAGNOSIS — J189 Pneumonia, unspecified organism: Secondary | ICD-10-CM | POA: Diagnosis not present

## 2023-09-05 LAB — NITRIC OXIDE: Nitric Oxide: 20

## 2023-09-05 MED ORDER — LEVOFLOXACIN 750 MG PO TABS: 750.0000 mg | ORAL_TABLET | Freq: Every day | ORAL | 0 refills | Status: AC

## 2023-09-05 NOTE — Patient Instructions (Signed)
 VISIT SUMMARY:  During your visit, we discussed your recent symptoms of wheezing and shortness of breath, which began after attending a work camp. You have been experiencing these symptoms for about a week, and they have slightly improved with medication. We reviewed your recent medical history, including a previous cancer diagnosis and a possible history of asthma from childhood. Your chest x-ray and clinical presentation suggest pneumonia, and we also considered the possibility of late onset asthma.  YOUR PLAN:  -PNEUMONIA: Pneumonia is an infection that inflames the air sacs in one or both lungs, which can cause symptoms like wheezing, shortness of breath, and cough. Based on your chest x-ray and symptoms, we suspect you have walking pneumonia, likely caused by Mycoplasma. We have prescribed a stronger antibiotic for 5 days. Please continue taking your current medications, including the cough medicine as needed, and ensure there are no drug interactions with your current medications.  -LATE ONSET ASTHMA: Late onset asthma is a condition where the airways become inflamed and narrowed, causing wheezing and difficulty breathing. This can develop later in life, even if you did not have asthma as a child. We suspect this due to your wheezing and past hyperventilation episodes. Please use the albuterol  inhaler 2-4 times daily and take Mucinex to help clear your airways. Avoid additional steroids as you are already on a course.  INSTRUCTIONS:  Please follow up with your primary care physician if your symptoms do not improve or if they worsen. Continue taking your medications as prescribed and monitor for any side effects. If you experience any new or worsening symptoms, seek medical attention promptly.

## 2023-09-05 NOTE — Progress Notes (Signed)
 Subjective:    Patient ID: Sandra [name], female    DOB: 16-Aug-1965, 58 y.o.   MRN: [id number]  Patient Care Team: Allwardt, Deleta Felix, PA-C as PCP - General (Physician Assistant) Katherin Pam, MD as Referring Physician (Specialist) Sim Dryer, MD as Consulting Physician (General Surgery) Cameron Cea, MD as Consulting Physician (Hematology and Oncology) Johna Myers, MD as Consulting Physician (Radiation Oncology)  Chief Complaint  Patient presents with   Consult    Cough, wheeze.  SOB with exertion.  Sx x 7 days.    BACKGROUND: Patient is a 58 year old lifelong never smoker with a prior history of breast cancer who presents for evaluation of persistent cough and wheezing and fatigue after respiratory infection.  She is kindly referred by Versa Gore, NP.  HPI Discussed the use of AI scribe software for clinical note transcription with the patient, who gave verbal consent to proceed.  History of Present Illness   Sandra [name] is a 58 year old female who presents with wheezing and shortness of breath.  She has been experiencing wheezing and shortness of breath for approximately seven days, which began after attending a work camp over [employer] Day weekend. Initially, she felt lethargic and developed a cough. She visited urgent care last Friday due to nausea and general malaise. COVID-19 and flu tests were negative. She was prescribed medications, but initially did not receive cough medicine due to insurance issues.  The wheezing started on Monday, June 2, prompting a telemedicine consultation, which recommended a chest x-ray and a visit to her primary care physician. She completed a Medrol  Dosepak prescribed on Friday and started a new prescription yesterday. The wheezing has improved slightly with the cough medicine, but she still experiences shortness of breath and dry mouth when walking or talking. She has never used an inhaler before and does not feel any relief  from the albuterol  inhaler.  This is her second upper respiratory infection since December, and she has no prior history of such infections. No fever or chills since the initial days of the illness and no reflux or heartburn. She is a lifelong non-smoker and has no known allergies to antibiotics.  Her past medical history includes breast cancer diagnosis in 2019. She recalls being very active as a child and experiencing hyperventilation, which may have been related to asthma, although she has no formal history of asthma. She was around children during the camp weekend but is not aware of any illnesses among them.   She had some recent travel to [place] but no exotic travel.  She lives in [place] with her husband and 2 daughters.  She is a Child psychotherapist.    Review of Systems A 10 point review of systems was performed and it is as noted above otherwise negative.   Past Medical History:  Diagnosis Date   Breast cancer (HCC) 11/2017   rt.Br.   Depression    Endometriosis, diagnosis via laparoscopy    Family history of breast cancer    Family history of breast cancer    Family history of colon cancer    Family history of prostate cancer    Hyperlipidemia 2017   Lichen sclerosus, vulva    Low serum vitamin D  2017   Personal history of chemotherapy    Personal history of radiation therapy     Past Surgical History:  Procedure Laterality Date   APPENDECTOMY  2005   BREAST BIOPSY Right 11/11/2017   BREAST BIOPSY Right 12/12/2017  x2   BREAST LUMPECTOMY Right 04/23/2018   BREAST LUMPECTOMY WITH RADIOACTIVE SEED AND SENTINEL LYMPH NODE BIOPSY Right 04/23/2018   Procedure: RIGHT BREAST WITH RADIOACTIVE SEED LUMPECTOMY x3 AND SENTINEL LYMPH NODE MAPPING;  Surgeon: Sim Dryer, MD;  Location: [facility] OR;  Service: General;  Laterality: Right;   CESAREAN SECTION  2002   MANDIBLE RECONSTRUCTION  1985   wires inside jaw according to pt   PELVIC LAPAROSCOPY  2004   PORT-A-CATH REMOVAL Right  01/27/2019   Procedure: PORT REMOVAL;  Surgeon: Sim Dryer, MD;  Location: [facility];  Service: General;  Laterality: Right;   PORTACATH PLACEMENT Right 12/03/2017   Procedure: INSERTION PORT-A-CATH;  Surgeon: Sim Dryer, MD;  Location: [facility];  Service: General;  Laterality: Right;   SHIN SURGERY  1998    Patient Active Problem List   Diagnosis Date Noted   Family history of malignant neoplasm of gastrointestinal tract 01/29/2023   Acute eczema 01/29/2023   Change in bowel habit 01/29/2023   Feces contents abnormal 01/29/2023   Nasal congestion 01/29/2023   Overweight 01/29/2023   Pain in joint of left knee 10/16/2022   Genetic testing 01/05/2018   Family history of prostate cancer    Family history of colon cancer    Port-A-Cath in place 12/10/2017   Malignant neoplasm of upper-outer quadrant of right breast in female, estrogen receptor positive (HCC) 11/14/2017   Melanocytic nevus of trunk 10/23/2017   Other seborrheic keratosis 10/23/2017   Hemangioma of skin and subcutaneous tissue 10/23/2017   Other melanin hyperpigmentation 10/23/2017   Skin mole 04/21/2017   Depression, recurrent (HCC) 04/21/2017   Obesity (BMI 30-39.9) 04/21/2017   Pure hypercholesterolemia 04/21/2017   Vitamin D  deficiency 04/21/2017   Lichen sclerosus et atrophicus of the vulva 04/21/2017   Lack of libido 01/13/2017   Bruxism 01/08/2016   Other and unspecified hyperlipidemia 02/11/2013    Family History  Problem Relation Age of Onset   Stroke Mother 16       smoking history   Heart disease Mother 46   Colon cancer Father    Prostate cancer Father    Hypertension Brother    SIDS Brother    Alcohol abuse Maternal Grandfather    Breast cancer Paternal Grandmother        dx in her 74s   Prostate cancer Paternal Grandfather    Cancer Maternal Uncle        NOS   Breast cancer Paternal Aunt        dx in her 33s   Cancer Paternal Aunt    Prostate  cancer Paternal Uncle    Tuberculosis Paternal Uncle     Social History   Tobacco Use   Smoking status: Never   Smokeless tobacco: Never  Substance Use Topics   Alcohol use: No    Alcohol/week: 0.0 standard drinks of alcohol    No Known Allergies  Current Meds  Medication Sig   acyclovir (ZOVIRAX) 800 MG tablet Take 800 mg by mouth 2 (two) times daily.   albuterol  (VENTOLIN  HFA) 108 (90 Base) MCG/ACT inhaler Inhale 2 puffs into the lungs every 6 (six) hours as needed for wheezing or shortness of breath.   betamethasone  valerate ointment (VALISONE ) 0.1 % APPLY TWICE A DAY FOR 1-2 WEEKS AS NEEDED FOR FLARE OF LICHEN SCLEROSIS.   Cholecalciferol 125 MCG (5000 UT) TABS 1 tablet Orally Once a day   clotrimazole  (LOTRIMIN ) 1 % cream APPLY TO AFFECTED AREA TWICE  A DAY   co-enzyme Q-10 30 MG capsule Take 30 mg by mouth 3 (three) times daily.   doxycycline (VIBRAMYCIN) 100 MG capsule Take 100 mg by mouth 2 (two) times daily. For 10 days   HYDROcodone bit-homatropine (HYCODAN) 5-1.5 MG/5ML syrup Take 5 mLs by mouth every 6 (six) hours as needed for cough.   letrozole  (FEMARA ) 2.5 MG tablet TAKE HALF A TABLET BY MOUTH DAILY.   levofloxacin  (LEVAQUIN ) 750 MG tablet Take 1 tablet (750 mg total) by mouth daily for 5 days.   methylPREDNISolone  (MEDROL ) 4 MG tablet Take 1 tablet (4 mg total) by mouth as directed. Take after breakfast: 4 pills for 4 days, then take 2 pills daily for 2 days, then 1 pill for 1 day.   Turmeric (QC TUMERIC COMPLEX PO) Take by mouth.   vortioxetine  HBr (TRINTELLIX ) 20 MG TABS tablet Take one tab daily    Immunization History  Administered Date(s) Administered   Influenza, Seasonal, Injecte, Preservative Fre 01/29/2023   Influenza,inj,Quad PF,6+ Mos 02/10/2017, 02/12/2018, 02/26/2021   Influenza-Unspecified 09/13/2021   Moderna Sars-Covid-2 Vaccination 06/23/2019, 07/21/2019, 08/20/2019   Tdap 08/06/2011, 12/07/2015   Zoster Recombinant(Shingrix) 05/16/2021,  10/15/2021        Objective:     BP 104/64 (BP Location: Left Arm, Patient Position: Sitting, Cuff Size: Large)   Pulse 73   Temp 98.4 F (36.9 C) (Oral)   Ht 5' 10 (1.778 m)   Wt 229 lb (103.9 kg)   SpO2 96%   BMI 32.86 kg/m   SpO2: 96 %  GENERAL: Well-developed, overweight woman, no acute distress.  Fully ambulatory, no conversational dyspnea. HEAD: Normocephalic, atraumatic.  EYES: Pupils equal, round, reactive to light.  No scleral icterus.  MOUTH: Dentition intact, oral mucosa moist.  No thrush. NECK: Supple. No thyromegaly. Trachea midline. No JVD.  No adenopathy. PULMONARY: Good air entry bilaterally.  Scattered rhonchi and few wheezes noted. CARDIOVASCULAR: S1 and S2. Regular rate and rhythm.  No rubs, murmurs or gallops heard. ABDOMEN: Benign. MUSCULOSKELETAL: No joint deformity, no clubbing, no edema.  NEUROLOGIC: No overt focal deficit, no gait disturbance, speech is fluent. SKIN: Intact,warm,dry. PSYCH: Mood and behavior normal.  Lab Results  Component Value Date   NITRICOXIDE 20 09/05/2023  *No evidence of type II inflammation, patient is on steroids currently  Reviewed laboratory data available.  Of note patient has had eosinophilia noted on CBC for at least 2 years.  Chest x-ray performed 02 September 2023, independently reviewed, shows right middle lobe pneumonia:   Assessment & Plan:     ICD-10-CM   1. Community acquired pneumonia of right middle lobe of lung  J18.9 DG Chest 2 View    2. Cough, unspecified type  R05.9 Nitric oxide     3. Eosinophilia, unspecified type  D72.10     4. Late onset asthma, unspecified asthma severity, unspecified whether complicated, unspecified whether persistent  J45.909    Suspected      Orders Placed This Encounter  Procedures   DG Chest 2 View    Standing Status:   Future    Expected Date:   09/26/2023    Expiration Date:   09/04/2024    Reason for Exam (SYMPTOM  OR DIAGNOSIS REQUIRED):   Pneumonia F/U    Is  patient pregnant?:   No    Preferred imaging location?:   [facility]   Nitric oxide     Meds ordered this encounter  Medications   levofloxacin  (LEVAQUIN ) 750 MG tablet  Sig: Take 1 tablet (750 mg total) by mouth daily for 5 days.    Dispense:  7 tablet    Refill:  0   Discussion:    Pneumonia Pneumonia suspected based on chest x-ray findings and clinical presentation, including wheezing, shortness of breath, and cough. Negative COVID-19 and influenza tests. Likely walking pneumonia, possibly due to Mycoplasma. No known antibiotic allergies. Current findings suggest pneumonia rather than scarring. - Prescribe Levaquin  750 mg for 5 days. - Continue current medications, including cough medicine as needed. - Ensure no drug interactions with current medications.  Late onset asthma Suspected late onset asthma due to wheezing and previous hyperventilation in childhood. Albuterol  inhaler provides no relief, possibly due to ongoing airway inflammation.  Persistent eosinophilia over the last 2 years suggestive of asthma tendency. - Use albuterol  inhaler 2-4 times daily. - Use Mucinex to help clear airways. - Avoid additional steroids as she is already on a course.     Advised if symptoms do not improve or worsen, to please contact office for sooner follow up or seek emergency care.    I spent 62 minutes of dedicated to the care of this patient on the date of this encounter to include pre-visit review of records, face-to-face time with the patient discussing conditions above, post visit ordering of testing, clinical documentation with the electronic health record, making appropriate referrals as documented, and communicating necessary findings to members of the patients care team.   C. Chloe Counter, MD Advanced Bronchoscopy PCCM [facility]    *This note was dictated using voice recognition software/Dragon.  Despite best efforts to proofread, errors can occur  which can change the meaning. Any transcriptional errors that result from this process are unintentional and may not be fully corrected at the time of dictation.

## 2023-09-10 ENCOUNTER — Encounter: Payer: Self-pay | Admitting: Pulmonary Disease

## 2023-09-12 ENCOUNTER — Other Ambulatory Visit (HOSPITAL_COMMUNITY): Payer: Self-pay | Admitting: Psychiatry

## 2023-09-12 DIAGNOSIS — F419 Anxiety disorder, unspecified: Secondary | ICD-10-CM

## 2023-09-12 DIAGNOSIS — F330 Major depressive disorder, recurrent, mild: Secondary | ICD-10-CM

## 2023-09-25 ENCOUNTER — Encounter (HOSPITAL_BASED_OUTPATIENT_CLINIC_OR_DEPARTMENT_OTHER): Admitting: Certified Nurse Midwife

## 2023-09-30 ENCOUNTER — Encounter: Payer: Self-pay | Admitting: Pulmonary Disease

## 2023-09-30 ENCOUNTER — Ambulatory Visit
Admission: RE | Admit: 2023-09-30 | Discharge: 2023-09-30 | Disposition: A | Discharge status: Home / Self Care | Source: Ambulatory Visit | Attending: Pulmonary Disease | Admitting: Pulmonary Disease

## 2023-09-30 ENCOUNTER — Ambulatory Visit: Admitting: Pulmonary Disease

## 2023-09-30 VITALS — BP 130/80 | HR 71 | Temp 98.3°F | Ht 70.0 in | Wt 238.6 lb

## 2023-09-30 DIAGNOSIS — D721 Eosinophilia, unspecified: Secondary | ICD-10-CM

## 2023-09-30 DIAGNOSIS — R918 Other nonspecific abnormal finding of lung field: Secondary | ICD-10-CM | POA: Diagnosis not present

## 2023-09-30 DIAGNOSIS — J189 Pneumonia, unspecified organism: Secondary | ICD-10-CM | POA: Diagnosis not present

## 2023-09-30 DIAGNOSIS — J45909 Unspecified asthma, uncomplicated: Secondary | ICD-10-CM

## 2023-09-30 DIAGNOSIS — R0602 Shortness of breath: Secondary | ICD-10-CM | POA: Diagnosis not present

## 2023-09-30 LAB — NITRIC OXIDE: Nitric Oxide: 37

## 2023-09-30 MED ORDER — TRELEGY ELLIPTA 100-62.5-25 MCG/ACT IN AEPB: 1.0000 | INHALATION_SPRAY | Freq: Every day | RESPIRATORY_TRACT | 11 refills | Status: AC

## 2023-09-30 MED ORDER — TRELEGY ELLIPTA 100-62.5-25 MCG/ACT IN AEPB: 1.0000 | INHALATION_SPRAY | Freq: Every day | RESPIRATORY_TRACT | Status: AC

## 2023-09-30 NOTE — Progress Notes (Signed)
 Subjective:    Patient ID: Sandra [name], female    DOB: 08-26-1965, 58 y.o.   MRN: [id number]  Patient Care Team: Allwardt, Mardy HERO, PA-C as PCP - General (Physician Assistant) Elnor Rome BROCKS, MD as Referring Physician (Specialist) Vanderbilt Ned, MD as Consulting Physician (General Surgery) Odean Potts, MD as Consulting Physician (Hematology and Oncology) Dewey Rush, MD as Consulting Physician (Radiation Oncology)  Chief Complaint  Patient presents with   Follow-up    Shortness of breath.     BACKGROUND/INTERVAL:Patient is a 58 year old lifelong never smoker with a prior history of breast cancer who presents for follow-up of persistent cough and wheezing and fatigue after respiratory infection.  She was initially seen here on 05 September 2023.  She was noted to have a right middle lobe pneumonia.  Nitric oxide  on that visit was 20 however the patient was on steroids at the time.  She presents today for follow-up on those issues.  She had x-ray prior to the visit.  HPI Discussed the use of AI scribe software for clinical note transcription with the patient, who gave verbal consent to proceed.  History of Present Illness   Sandra [name] is a 58 year old female who presents for follow-up of pneumonia.  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 This is exacerbated by not having her inhaler readily available.  During her prior visit she had nitric oxide  level of 20 however it is noted that she was on prednisone at the time.  She was treated with Levaquin  and this resolved her cough.  She still experiencing shortness of breath as noted above.  She has not been using her inhaler regularly due to its location in her purse and the inconvenience during activities such as teaching.     Since her prior visit she has not had any fevers, chills  or sweats.  She does not endorse any other symptomatology today.   Review of Systems A 10 point review of systems was performed and it is as noted above otherwise negative.   Patient Active Problem List   Diagnosis Date Noted   Family history of malignant neoplasm of gastrointestinal tract 01/29/2023   Acute eczema 01/29/2023   Change in bowel habit 01/29/2023   Feces contents abnormal 01/29/2023   Nasal congestion 01/29/2023   Overweight 01/29/2023   Pain in joint of left knee 10/16/2022   Genetic testing 01/05/2018   Family history of prostate cancer    Family history of colon cancer    Port-A-Cath in place 12/10/2017   Malignant neoplasm of upper-outer quadrant of right breast in female, estrogen receptor positive (HCC) 11/14/2017   Melanocytic nevus of trunk 10/23/2017   Other seborrheic keratosis 10/23/2017   Hemangioma of skin and subcutaneous tissue 10/23/2017   Other melanin hyperpigmentation 10/23/2017   Skin mole 04/21/2017   Depression, recurrent (HCC) 04/21/2017   Obesity (BMI 30-39.9) 04/21/2017   Pure hypercholesterolemia 04/21/2017   Vitamin D  deficiency 04/21/2017   Lichen sclerosus et atrophicus of the vulva 04/21/2017   Lack of libido 01/13/2017   Bruxism 01/08/2016   Other and unspecified hyperlipidemia 02/11/2013    Social History   Tobacco Use   Smoking status: Never   Smokeless tobacco: Never  Substance Use Topics   Alcohol use: No    Alcohol/week: 0.0 standard drinks of alcohol    No Known Allergies  Current Meds  Medication Sig   acyclovir (ZOVIRAX) 800 MG tablet Take 800 mg by mouth 2 (two) times daily.   albuterol  (VENTOLIN  HFA) 108 (90 Base) MCG/ACT inhaler Inhale 2 puffs into the lungs every 6 (six) hours as needed for wheezing or shortness of breath.   betamethasone  valerate ointment (VALISONE ) 0.1 % APPLY TWICE A DAY FOR 1-2 WEEKS AS NEEDED FOR FLARE OF LICHEN SCLEROSIS.   Cholecalciferol 125 MCG (5000 UT) TABS 1 tablet Orally Once a  day   clotrimazole  (LOTRIMIN ) 1 % cream APPLY TO AFFECTED AREA TWICE A DAY   co-enzyme Q-10 30 MG capsule Take 30 mg by mouth 3 (three) times daily.   Fluticasone-Umeclidin-Vilant (TRELEGY ELLIPTA) 100-62.5-25 MCG/ACT AEPB Inhale 1 puff into the lungs daily.   Fluticasone-Umeclidin-Vilant (TRELEGY ELLIPTA) 100-62.5-25 MCG/ACT AEPB Inhale 1 puff into the lungs daily in the afternoon.   letrozole  (FEMARA ) 2.5 MG tablet TAKE HALF A TABLET BY MOUTH DAILY.   Turmeric (QC TUMERIC COMPLEX PO) Take by mouth.   vortioxetine  HBr (TRINTELLIX ) 20 MG TABS tablet Take one tab daily    Immunization History  Administered Date(s) Administered   Influenza, Seasonal, Injecte, Preservative Fre 01/29/2023   Influenza,inj,Quad PF,6+ Mos 02/10/2017, 02/12/2018, 02/26/2021   Influenza-Unspecified 09/13/2021   Moderna Sars-Covid-2 Vaccination 06/23/2019, 07/21/2019, 08/20/2019   Tdap 08/06/2011, 12/07/2015   Zoster Recombinant(Shingrix) 05/16/2021, 10/15/2021        Objective:     BP 130/80 (BP Location: Left Arm, Patient Position: Sitting, Cuff Size: Normal)   Pulse 71   Temp 98.3 F (36.8 C) (Oral)   Ht 5' 10 (1.778 m)   Wt 238 lb 9.6 oz (108.2 kg)   SpO2 99%   BMI 34.24 kg/m   SpO2: 99 %  GENERAL: Well-developed, overweight woman, no acute distress.  Fully ambulatory, no conversational dyspnea. HEAD: Normocephalic, atraumatic.  EYES: Pupils equal, round, reactive to light.  No scleral icterus.  MOUTH: Dentition intact, oral mucosa moist.  No thrush. NECK: Supple. No thyromegaly. Trachea midline. No JVD.  No adenopathy. PULMONARY: Good air entry bilaterally.  No adventitious sounds. CARDIOVASCULAR: S1 and S2. Regular rate and rhythm.  No rubs, murmurs or gallops heard. ABDOMEN: Benign. MUSCULOSKELETAL: No joint deformity, no clubbing, no edema.  NEUROLOGIC: No overt focal deficit, no gait disturbance, speech is fluent. SKIN: Intact,warm,dry. PSYCH: Mood and behavior normal.  Chest x-ray  performed today, formal interpretation pending, independently reviewed, there is improvement on the right middle lobe opacity previously noted with some mild streaky atelectasis remaining:   Lab Results  Component Value Date   NITRICOXIDE 37 09/30/2023  *Trend: 20>>37 ppb *There is evidence of type II inflammation noted.    Assessment & Plan:     ICD-10-CM   1. Persistent asthma without complication, unspecified asthma severity  J45.909 Pulmonary function test    CBC with Differential/Platelet    Allergen Panel (27) + IGE    2. Shortness of breath  R06.02 Nitric oxide     Pulmonary function test    3. Community acquired pneumonia of right middle lobe of lung - RESOLVED  J18.9     4. Eosinophilia, unspecified type  D72.10 CBC with Differential/Platelet    Allergen Panel (27) + IGE      Orders Placed This Encounter  Procedures   CBC with Differential/Platelet    Standing Status:   Future    Expected Date:   09/30/2023    Expiration Date:   09/29/2024   Allergen Panel (27) + IGE    Standing Status:  Future    Expiration Date:   09/29/2024   Nitric oxide    Pulmonary function test    Standing Status:   Future    Expiration Date:   09/29/2024    Where should this test be performed?:   Outpatient Pulmonary    What type of PFT is being ordered?:   Full PFT    Meds ordered this encounter  Medications   Fluticasone-Umeclidin-Vilant (TRELEGY ELLIPTA) 100-62.5-25 MCG/ACT AEPB    Sig: Inhale 1 puff into the lungs daily.    Dispense:  28 each    Refill:  11   Fluticasone-Umeclidin-Vilant (TRELEGY ELLIPTA) 100-62.5-25 MCG/ACT AEPB    Sig: Inhale 1 puff into the lungs daily in the afternoon.    Lot Number?:   [id number]    Expiration Date?:   11/30/2024    NDC:   9826-9112-38 [372260]    Quantity:   1   Discussion:    Persistent asthma without complication Asthma diagnosis confirmed with elevated airway nitric oxide  level of 37. Symptoms include shortness of breath, particularly during  activities. - Start Trelegy Ellipta 100, one inhalation daily, best covered by insurance. - Provide coupon and sample for Trelegy Ellipta. - Schedule pulmonary function tests. - Check CBC with differential and allergen panel + IgE. - Instruct to carry rescue inhaler at all times. - Advise to rinse mouth after Trelegy inhaler use.  Pneumonia Pneumonia has clinically resolved with marked improvement in the chest x-ray. Residual scarring is expected. Previously treated with Levaquin , which was effective. - Order follow-up chest x-ray in 6 to 8 weeks to monitor resolution of pneumonia.    Advised if symptoms do not improve or worsen, to please contact office for sooner follow up or seek emergency care.    I spent 60 minutes of dedicated to the care of this patient on the date of this encounter to include pre-visit review of records, face-to-face time with the patient discussing conditions above, post visit ordering of testing, clinical documentation with the electronic health record, making appropriate referrals as documented, and communicating necessary findings to members of the patients care team.     C. Leita Sanders, MD Advanced Bronchoscopy PCCM [facility]    *This note was generated using voice recognition software/Dragon and/or AI transcription program.  Despite best efforts to proofread, errors can occur which can change the meaning. Any transcriptional errors that result from this process are unintentional and may not be fully corrected at the time of dictation.

## 2023-09-30 NOTE — Patient Instructions (Signed)
 VISIT SUMMARY:  Today, you were seen for a follow-up appointment regarding your pneumonia and asthma. 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 We discussed your asthma management and made some adjustments to your treatment plan.  YOUR PLAN:  -ASTHMA: Asthma is a condition where your airways become inflamed and narrow, making it hard to breathe. Your asthma diagnosis was confirmed with an elevated airway inflammation level of 37. To help manage your symptoms, you will start using Trelegy Ellipta 100, which is an inhaler that you will use once daily. This inhaler contains three medications to help control your asthma. Please carry your inhaler with you at all times and rinse your mouth after each use. We will also schedule breathing tests to monitor your condition.  -PNEUMONIA: Pneumonia is an infection that inflames the air sacs in one or both lungs. Your pneumonia is improving, and your chest x-ray shows marked improvement. However, some residual scarring is expected. You were previously treated with Levaquin , which was effective. We will order a follow-up chest x-ray in 6 to 8 weeks to ensure the pneumonia has resolved.  INSTRUCTIONS:  Please schedule a follow-up chest x-ray in 6 to 8 weeks to monitor the resolution of your pneumonia. Additionally, we will schedule breathing tests to keep track of your asthma. Make sure to carry your inhaler with you at all times and rinse your mouth after each use of the inhaler.

## 2023-10-01 ENCOUNTER — Ambulatory Visit: Payer: Self-pay | Admitting: Pulmonary Disease

## 2023-10-07 DIAGNOSIS — F330 Major depressive disorder, recurrent, mild: Secondary | ICD-10-CM | POA: Diagnosis not present

## 2023-10-15 ENCOUNTER — Other Ambulatory Visit (HOSPITAL_COMMUNITY): Payer: Self-pay | Admitting: Psychiatry

## 2023-10-15 DIAGNOSIS — F419 Anxiety disorder, unspecified: Secondary | ICD-10-CM

## 2023-10-15 DIAGNOSIS — F330 Major depressive disorder, recurrent, mild: Secondary | ICD-10-CM

## 2023-10-20 ENCOUNTER — Other Ambulatory Visit (HOSPITAL_COMMUNITY): Payer: Self-pay

## 2023-10-20 DIAGNOSIS — F330 Major depressive disorder, recurrent, mild: Secondary | ICD-10-CM

## 2023-10-20 DIAGNOSIS — F419 Anxiety disorder, unspecified: Secondary | ICD-10-CM

## 2023-10-20 MED ORDER — VORTIOXETINE HBR 20 MG PO TABS: ORAL_TABLET | ORAL | 0 refills | Status: DC

## 2023-10-29 ENCOUNTER — Telehealth (HOSPITAL_BASED_OUTPATIENT_CLINIC_OR_DEPARTMENT_OTHER): Admitting: Psychiatry

## 2023-10-29 ENCOUNTER — Encounter (HOSPITAL_COMMUNITY): Payer: Self-pay | Admitting: Psychiatry

## 2023-10-29 DIAGNOSIS — F330 Major depressive disorder, recurrent, mild: Secondary | ICD-10-CM | POA: Diagnosis not present

## 2023-10-29 DIAGNOSIS — F419 Anxiety disorder, unspecified: Secondary | ICD-10-CM | POA: Diagnosis not present

## 2023-10-29 MED ORDER — VORTIOXETINE HBR 20 MG PO TABS: ORAL_TABLET | ORAL | 0 refills | Status: DC

## 2023-10-29 NOTE — Progress Notes (Signed)
 [facility] MD Virtual Progress Note   Patient Location: In Car Provider Location: Home Office  I connect with patient by video and verified that I am speaking with correct person by using two identifiers. I discussed the limitations of evaluation and management by telemedicine and the availability of in person appointments. I also discussed with the patient that there may be a patient responsible charge related to this service. The patient expressed understanding and agreed to proceed.  Sandra [name] [id number] 58 y.o.  10/29/2023 9:12 AM  History of Present Illness:  Patient is evaluated by video session.  She apologized missing last appointment and reported some issues getting the prescription from the pharmacy.  She reported without the medication for 1 week and noticed in the beginning sad, emotional, labile mood and feeling down.  She is back on medication 2 days ago and so far no major concern.  Patient reported having breathing issues and pulmonologist recently diagnosed with asthma and given inhaler.  She also reported few pounds weight gain since the last visit.  She has appointment to see the doctor and not sure if the menopause caused the weight gain.  She reported some anxiety as challenges from the work.  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  She has few minor panic attack denies any hallucination, paranoia, suicidal thoughts.  [facility].  Patient admitted sometime she feel very stressed out when she have any sickness as thinking about her cancer coming back.  So far Strattera is helping her mood and anxiety.  She has no tremors shakes or any EPS.  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  She wants to continue Trintellix .  Past Psychiatric History: [facility].  Tried Paxil , Lexapro, ativan  and trazadone but don't remember.   Lamictal did not help and Wellbutrin caused depression.  We tried Effexor  and Prozac  but did not help.  No h/o psychosis, suicidal attempt or Inpatient.     Past Medical History:  Diagnosis Date   Breast cancer (HCC) 11/2017   rt.Br.   Depression    Endometriosis, diagnosis via laparoscopy    Family history of breast cancer    Family history of breast cancer    Family history of colon cancer    Family history of prostate cancer    Hyperlipidemia 2017   Lichen sclerosus, vulva    Low serum vitamin D  2017   Personal history of chemotherapy    Personal history of radiation therapy     Outpatient Encounter Medications as of 10/29/2023  Medication Sig   acyclovir (ZOVIRAX) 800 MG tablet Take 800 mg by mouth 2 (two) times daily.   albuterol  (VENTOLIN  HFA) 108 (90 Base) MCG/ACT inhaler Inhale 2 puffs into the lungs every 6 (six) hours as needed for wheezing or shortness of breath.   betamethasone  valerate ointment (VALISONE ) 0.1 % APPLY TWICE A DAY FOR 1-2 WEEKS AS NEEDED FOR FLARE OF LICHEN SCLEROSIS.   Cholecalciferol 125 MCG (5000 UT) TABS 1 tablet Orally Once a day   clotrimazole  (LOTRIMIN ) 1 % cream APPLY TO AFFECTED AREA TWICE A DAY   co-enzyme Q-10 30 MG capsule Take 30 mg by mouth 3 (three) times daily.   doxycycline (VIBRAMYCIN) 100 MG capsule Take 100 mg by mouth 2 (two) times daily. For 10 days (Patient not taking: Reported on 09/30/2023)  Fluticasone-Umeclidin-Vilant (TRELEGY ELLIPTA ) 100-62.5-25 MCG/ACT AEPB Inhale 1 puff into the lungs daily.   Fluticasone-Umeclidin-Vilant (TRELEGY ELLIPTA ) 100-62.5-25 MCG/ACT AEPB Inhale 1 puff into the lungs daily in the afternoon.   HYDROcodone bit-homatropine (HYCODAN) 5-1.5 MG/5ML syrup Take 5 mLs by mouth every 6 (six) hours as needed for cough. (Patient not taking: Reported on 09/30/2023)   letrozole  (FEMARA ) 2.5 MG tablet TAKE HALF A TABLET BY MOUTH DAILY.   methylPREDNISolone  (MEDROL ) 4 MG tablet Take 1 tablet (4 mg total) by mouth as  directed. Take after breakfast: 4 pills for 4 days, then take 2 pills daily for 2 days, then 1 pill for 1 day. (Patient not taking: Reported on 09/30/2023)   Turmeric (QC TUMERIC COMPLEX PO) Take by mouth.   vortioxetine  HBr (TRINTELLIX ) 20 MG TABS tablet Take one tab daily   No facility-administered encounter medications on file as of 10/29/2023.    Recent Results (from the past 2160 hours)  Nitric oxide      Status: None   Collection Time: 09/05/23  8:51 AM  Result Value Ref Range   Nitric Oxide  20   Nitric oxide      Status: None   Collection Time: 09/30/23  9:12 AM  Result Value Ref Range   Nitric Oxide  37      Psychiatric Specialty Exam: Physical Exam  Review of Systems  Weight 238 lb (108 kg).There is no height or weight on file to calculate BMI.  General Appearance: Casual  Eye Contact:  Good  Speech:  Clear and Coherent and Normal Rate  Volume:  Normal  Mood:  Euthymic  Affect:  Appropriate  Thought Process:  Goal Directed  Orientation:  Full (Time, Place, and Person)  Thought Content:  WDL  Suicidal Thoughts:  No  Homicidal Thoughts:  No  Memory:  Immediate;   Good Recent;   Good Remote;   Good  Judgement:  Good  Insight:  Good  Psychomotor Activity:  Normal  Concentration:  Concentration: Good and Attention Span: Good  Recall:  Good  Fund of Knowledge:  Good  Language:  Good  Akathisia:  No  Handed:  Right  AIMS (if indicated):     Assets:  Communication Skills Desire for Improvement Housing Resilience Talents/Skills Transportation  ADL's:  Intact  Cognition:  WNL  Sleep:  ok       01/29/2023    9:08 AM 11/12/2022    2:05 PM 02/19/2021   12:28 PM 09/27/2020    8:41 AM 09/19/2020    9:37 AM  Depression screen PHQ 2/9  Decreased Interest 1 1 2 1 1   Down, Depressed, Hopeless 0 1 2 1 1   PHQ - 2 Score 1 2 4 2 2   Altered sleeping 1 1 0 1 2  Tired, decreased energy 2 2 3 1 1   Change in appetite 3 1 2 1 2   Feeling bad or failure about yourself  1 1 0 0  2  Trouble concentrating 0 0 0 1 1  Moving slowly or fidgety/restless 0 0 1 1 0  Suicidal thoughts 0 0 0 0 0  PHQ-9 Score 8 7 10 7 10   Difficult doing work/chores Not difficult at all Not difficult at all       Assessment/Plan: Anxiety - Plan: vortioxetine  HBr (TRINTELLIX ) 20 MG TABS tablet  MDD (major depressive disorder), recurrent episode, mild (HCC) - Plan: vortioxetine  HBr (TRINTELLIX ) 20 MG TABS tablet  Patient is 58 year old female with history of breast cancer currently in remission, recently diagnosed with  asthma and taking inhaler.  Reviewed collateral information and notes from other provider.  She reported to that is helping and there was a time when she was not taking her mood symptoms are coming back.  Discussed compliance and keep the appointment to avoid relapse of the symptoms.  Patient also concerned about weight gain and going to see the doctor tomorrow for further workup.  She has questions about hormone replacement therapy and I suggested should consider the specialist however given the history of breast cancer she need to be very cautious about it.  Patient wants to continue to tolerance.  Encouraged to continue therapy with Michelle Gallimore.  Will follow-up in 3 months.  Will provide a 90-day prescription to her pharmacy.  Encouraged to call back if she has any question or any concern.   Follow Up Instructions:     I discussed the assessment and treatment plan with the patient. The patient was provided an opportunity to ask questions and all were answered. The patient agreed with the plan and demonstrated an understanding of the instructions.   The patient was advised to call back or seek an in-person evaluation if the symptoms worsen or if the condition fails to improve as anticipated.    Collaboration of Care: Other provider involved in patient's care AEB notes are available in epic to review  Patient/Guardian was advised Release of Information must be obtained  prior to any record release in order to collaborate their care with an outside provider. Patient/Guardian was advised if they have not already done so to contact the registration department to sign all necessary forms in order for us  to release information regarding their care.   Consent: Patient/Guardian gives verbal consent for treatment and assignment of benefits for services provided during this visit. Patient/Guardian expressed understanding and agreed to proceed.     Total encounter time 25 minutes which includes face-to-face time, chart reviewed, care coordination, order entry and documentation during this encounter.   Note: This document was prepared by [employer] voice dictation technology and any errors that results from this process are unintentional.    Leni ONEIDA Client, MD 10/29/2023

## 2023-10-31 ENCOUNTER — Encounter (HOSPITAL_BASED_OUTPATIENT_CLINIC_OR_DEPARTMENT_OTHER): Payer: Self-pay | Admitting: Certified Nurse Midwife

## 2023-10-31 ENCOUNTER — Ambulatory Visit (HOSPITAL_BASED_OUTPATIENT_CLINIC_OR_DEPARTMENT_OTHER): Admitting: Certified Nurse Midwife

## 2023-10-31 VITALS — BP 123/71 | HR 76 | Wt 235.0 lb

## 2023-10-31 DIAGNOSIS — Z853 Personal history of malignant neoplasm of breast: Secondary | ICD-10-CM

## 2023-10-31 DIAGNOSIS — Z6833 Body mass index (BMI) 33.0-33.9, adult: Secondary | ICD-10-CM | POA: Diagnosis not present

## 2023-10-31 DIAGNOSIS — Z01419 Encounter for gynecological examination (general) (routine) without abnormal findings: Secondary | ICD-10-CM

## 2023-10-31 DIAGNOSIS — N951 Menopausal and female climacteric states: Secondary | ICD-10-CM

## 2023-10-31 DIAGNOSIS — L918 Other hypertrophic disorders of the skin: Secondary | ICD-10-CM

## 2023-10-31 DIAGNOSIS — N952 Postmenopausal atrophic vaginitis: Secondary | ICD-10-CM | POA: Diagnosis not present

## 2023-10-31 DIAGNOSIS — Z780 Asymptomatic menopausal state: Secondary | ICD-10-CM

## 2023-10-31 DIAGNOSIS — N904 Leukoplakia of vulva: Secondary | ICD-10-CM

## 2023-10-31 DIAGNOSIS — F520 Hypoactive sexual desire disorder: Secondary | ICD-10-CM

## 2023-10-31 DIAGNOSIS — Z1283 Encounter for screening for malignant neoplasm of skin: Secondary | ICD-10-CM

## 2023-10-31 MED ORDER — CLOBETASOL PROPIONATE 0.05 % EX OINT: TOPICAL_OINTMENT | CUTANEOUS | 5 refills | Status: AC

## 2023-10-31 NOTE — Progress Notes (Signed)
 58 y.o. [id number] Married White or Caucasian female here for problem visit and to establish care. She lives with spouse Marcey and they have two daughters Kyra age 10 and Katie age 83. Kyra was born vaginally and Mallie was born via CS. Treanna works full-time for [employer]. Spouse works full-time as well.   1) Pt c/o weight gain. She is aware that healthy diet and exercise is recommended. Has seen [facility] in past. 2) Hx Lichen Sclerosus and it is currently flared and itchy 3) Pt on Trintellix  due to hx depression and anxiety and reports it seems to be working effectively at present 4) Pt has only experienced 2 orgasms in her life. She currently does not have sex drive or libido and would like to improve this for her spouse. She does not think they have been sexually active in 15 years. Pt was diagnosed with Breast Cancer in 2019. Pt reports trying to have sex would be incredibly painful due to dryness feels like sandpaper. Lubricant was not helpful. 5) Hx Breast Cancer, right lumpectomy, chemo/radiation, on Femara  (plan is to d/c Femara  after 7 years) 6) Pap smear due 7) Pt does have hot flashes but not candidate for estrogen due to Hx Breast Cancer 8) Postmenopausal (approx 7+ years) 9) Bothersome skin tags in right axilla  No LMP recorded. (Menstrual status: Chemotherapy).          Sexually active: Not in past 15 years but would like to be  The current method of family planning is post menopausal status.     Exercising: Some   Smoker:  no  Health Maintenance: Pap:  Last pap 01/2021 Negative History of abnormal Pap:  no MMG:  05/16/23 IMPRESSION: 1. No evidence of new or recurrent breast carcinoma. 2. Benign post lumpectomy changes on the right.  Colonoscopy:  UTD BMD:   Not yet Screening Labs: Desired   reports that she has never smoked. She has never used smokeless tobacco. She reports that she does not drink alcohol and does not use drugs.  Past Medical History:  Diagnosis  Date   Breast cancer (HCC) 11/2017   rt.Br.   Depression    Endometriosis, diagnosis via laparoscopy    Family history of breast cancer    Family history of breast cancer    Family history of colon cancer    Family history of prostate cancer    Hyperlipidemia 2017   Lichen sclerosus, vulva    Low serum vitamin D  2017   Personal history of chemotherapy    Personal history of radiation therapy     Past Surgical History:  Procedure Laterality Date   APPENDECTOMY  2005   BREAST BIOPSY Right 11/11/2017   BREAST BIOPSY Right 12/12/2017   x2   BREAST LUMPECTOMY Right 04/23/2018   BREAST LUMPECTOMY WITH RADIOACTIVE SEED AND SENTINEL LYMPH NODE BIOPSY Right 04/23/2018   Procedure: RIGHT BREAST WITH RADIOACTIVE SEED LUMPECTOMY x3 AND SENTINEL LYMPH NODE MAPPING;  Surgeon: Vanderbilt Ned, MD;  Location: [facility] OR;  Service: General;  Laterality: Right;   CESAREAN SECTION  2002   MANDIBLE RECONSTRUCTION  1985   wires inside jaw according to pt   PELVIC LAPAROSCOPY  2004   PORT-A-CATH REMOVAL Right 01/27/2019   Procedure: PORT REMOVAL;  Surgeon: Vanderbilt Ned, MD;  Location: [facility];  Service: General;  Laterality: Right;   PORTACATH PLACEMENT Right 12/03/2017   Procedure: INSERTION PORT-A-CATH;  Surgeon: Vanderbilt Ned, MD;  Location:  SURGERY CENTER;  Service: General;  Laterality: Right;   SHIN SURGERY  1998    Current Outpatient Medications  Medication Sig Dispense Refill   betamethasone  valerate ointment (VALISONE ) 0.1 % APPLY TWICE A DAY FOR 1-2 WEEKS AS NEEDED FOR FLARE OF LICHEN SCLEROSIS. 15 g 1   clobetasol  ointment (TEMOVATE ) 0.05 % Apply twice a day to affected area for 2 weeks then twice weekly 60 g 5   clotrimazole  (LOTRIMIN ) 1 % cream APPLY TO AFFECTED AREA TWICE A DAY 45 g 0   Fluticasone-Umeclidin-Vilant (TRELEGY ELLIPTA ) 100-62.5-25 MCG/ACT AEPB Inhale 1 puff into the lungs daily. 28 each 11   Fluticasone-Umeclidin-Vilant (TRELEGY ELLIPTA )  100-62.5-25 MCG/ACT AEPB Inhale 1 puff into the lungs daily in the afternoon.     letrozole  (FEMARA ) 2.5 MG tablet TAKE HALF A TABLET BY MOUTH DAILY. 45 tablet 3   OVER THE COUNTER MEDICATION Take 3 capsules by mouth daily. Probiome complex     OVER THE COUNTER MEDICATION Take 3 capsules by mouth daily. PB Restore     vortioxetine  HBr (TRINTELLIX ) 20 MG TABS tablet Take one tab daily 90 tablet 0   No current facility-administered medications for this visit.    Family History  Problem Relation Age of Onset   Stroke Mother 9       smoking history   Heart disease Mother 33   Colon cancer Father    Prostate cancer Father    Hypertension Brother    SIDS Brother    Alcohol abuse Maternal Grandfather    Breast cancer Paternal Grandmother        dx in her 65s   Prostate cancer Paternal Grandfather    Cancer Maternal Uncle        NOS   Breast cancer Paternal Aunt        dx in her 17s   Cancer Paternal Aunt    Prostate cancer Paternal Uncle    Tuberculosis Paternal Uncle     ROS: Constitutional: positive for hot flashes, weight gain, difficulty losing weight Genitourinary: No urinary c/o. No incontinence. +vaginal dryness severe. No vaginal spotting or bleeding.   Exam:   BP 123/71 (BP Location: Left Arm, Patient Position: Sitting, Cuff Size: Large)   Pulse 76   Wt 235 lb (106.6 kg)   SpO2 98%   BMI 33.72 kg/m      General appearance: alert, cooperative and appears stated age Head: Normocephalic, without obvious abnormality, atraumatic Breasts: normal appearance, no masses or tenderness, Inspection negative, No nipple retraction or dimpling, No nipple discharge or bleeding, No axillary or supraclavicular adenopathy, Normal to palpation without dominant masses, Hx Right lumpectomy Heart: regular rate and rhythm Abdomen: soft, non-tender; bowel sounds normal; no masses,  no organomegaly Extremities: extremities normal, atraumatic, no cyanosis or edema Skin: Skin color, texture,  turgor normal. No rashes or lesions. Skin tags (tiny) right axilla Lymph nodes: Cervical, supraclavicular, and axillary nodes normal. No abnormal inguinal nodes palpated Neurologic: Grossly normal   Pelvic: External genitalia:  +Lichen sclerosus appears to be in a flare at this time, white plaques present in hourglass pattern              Urethra:  normal appearing urethra with no masses, tenderness or lesions              Bartholins and Skenes: normal                 Vagina: severely atrophic vagina              Cervix:  no lesions              Pap taken: Yes.   Bimanual Exam:  Uterus:  normal size, contour, position, consistency, mobility, non-tender              Adnexa: no mass, fullness, tenderness              Anus:  normal sphincter tone, no lesions  Chaperone,  CMA, was present for exam.  Assessment/Plan:  1. Encounter for problem Gyn Visit/establish care - CBC - Comp Met (CMET) - TSH - Hemoglobin A1c - Lipid panel - VITAMIN D  25 Hydroxy (Vit-D Deficiency, Fractures) - B12 - Pap smear collected - Continue annual screening mammograms  2. 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 Clobetasol  to be applied (fingertip amount) twice a day for 2 weeks then twice weekly - Pt will follow-up in 2 months for evaluation of LS  3. Vaginal atrophy -Pt not a candidate for estrogen due to Hx Breast Cancer - Information given to help pt consider vaginal Hyaluronic suppositories  4. Postmenopausal   5. BMI 33.0-33.9,adult - Routine screening labs pending - Pt considering medication therapy. Will consider Qsymia/ Wegovy if insurance covers - She is aware of availability [facility] and weight loss management center  6. Skin tag - Pt had 4 tiny skin tags present but bothersome right axilla. - Silver nitrate applied to skin tags to resolve  7. HX: breast cancer - Pt on Femara  2.5mg  daily  8. Lack of libido - Information provided on [facility] - Pt will consider a supplement such as Ristela (Bonafide) desired to increase physical arousal and orgasm (increased blood flow to vagina)  9. Hot flash, menopausal   RTO for follow-up in 2 months. Today's visit was from 0907 until 1007 continuous involving history taking, physical exam, discussion of potential risks/benefits of therapies, therapy available. Verbal counseling was over 45 minutes. Arland MARLA Roller

## 2023-11-01 LAB — CBC
Hematocrit: 40.3 % (ref 34.0–46.6)
Hemoglobin: 13.3 g/dL (ref 11.1–15.9)
MCH: 30.4 pg (ref 26.6–33.0)
MCHC: 33 g/dL (ref 31.5–35.7)
MCV: 92 fL (ref 79–97)
Platelets: 267 x10E3/uL (ref 150–450)
RBC: 4.37 x10E6/uL (ref 3.77–5.28)
RDW: 13.6 % (ref 11.7–15.4)
WBC: 5.1 x10E3/uL (ref 3.4–10.8)

## 2023-11-01 LAB — COMPREHENSIVE METABOLIC PANEL WITH GFR
ALT: 25 IU/L (ref 0–32)
AST: 22 IU/L (ref 0–40)
Albumin: 4.4 g/dL (ref 3.8–4.9)
Alkaline Phosphatase: 66 IU/L (ref 44–121)
BUN/Creatinine Ratio: 13 (ref 9–23)
BUN: 12 mg/dL (ref 6–24)
Bilirubin Total: 0.4 mg/dL (ref 0.0–1.2)
CO2: 22 mmol/L (ref 20–29)
Calcium: 9.5 mg/dL (ref 8.7–10.2)
Chloride: 103 mmol/L (ref 96–106)
Creatinine, Ser: 0.9 mg/dL (ref 0.57–1.00)
Globulin, Total: 2.8 g/dL (ref 1.5–4.5)
Glucose: 89 mg/dL (ref 70–99)
Potassium: 4.1 mmol/L (ref 3.5–5.2)
Sodium: 140 mmol/L (ref 134–144)
Total Protein: 7.2 g/dL (ref 6.0–8.5)
eGFR: 75 mL/min/1.73 (ref >59)

## 2023-11-01 LAB — LIPID PANEL
Chol/HDL Ratio: 4.6 ratio — ABNORMAL HIGH (ref 0.0–4.4)
Cholesterol, Total: 248 mg/dL — ABNORMAL HIGH (ref 100–199)
HDL: 54 mg/dL (ref >39)
LDL Chol Calc (NIH): 169 mg/dL — ABNORMAL HIGH (ref 0–99)
Triglycerides: 140 mg/dL (ref 0–149)
VLDL Cholesterol Cal: 25 mg/dL (ref 5–40)

## 2023-11-01 LAB — VITAMIN D 25 HYDROXY (VIT D DEFICIENCY, FRACTURES): Vit D, 25-Hydroxy: 43.6 ng/mL (ref 30.0–100.0)

## 2023-11-01 LAB — HEMOGLOBIN A1C
Est. average glucose Bld gHb Est-mCnc: 117 mg/dL
Hgb A1c MFr Bld: 5.7 % — ABNORMAL HIGH (ref 4.8–5.6)

## 2023-11-01 LAB — TSH: TSH: 1.53 u[IU]/mL (ref 0.450–4.500)

## 2023-11-01 LAB — VITAMIN B12: Vitamin B-12: 600 pg/mL (ref 232–1245)

## 2023-11-03 ENCOUNTER — Other Ambulatory Visit (HOSPITAL_COMMUNITY)
Admission: RE | Admit: 2023-11-03 | Discharge: 2023-11-03 | Disposition: A | Discharge status: Home / Self Care | Source: Ambulatory Visit | Attending: Certified Nurse Midwife | Admitting: Certified Nurse Midwife

## 2023-11-03 ENCOUNTER — Other Ambulatory Visit (HOSPITAL_BASED_OUTPATIENT_CLINIC_OR_DEPARTMENT_OTHER): Payer: Self-pay | Admitting: Certified Nurse Midwife

## 2023-11-03 DIAGNOSIS — Z124 Encounter for screening for malignant neoplasm of cervix: Secondary | ICD-10-CM

## 2023-11-05 LAB — CYTOLOGY - PAP
Comment: NEGATIVE
Diagnosis: NEGATIVE
High risk HPV: NEGATIVE

## 2023-11-06 DIAGNOSIS — F330 Major depressive disorder, recurrent, mild: Secondary | ICD-10-CM | POA: Diagnosis not present

## 2023-11-07 ENCOUNTER — Ambulatory Visit (HOSPITAL_BASED_OUTPATIENT_CLINIC_OR_DEPARTMENT_OTHER): Payer: Self-pay | Admitting: Certified Nurse Midwife

## 2023-11-07 DIAGNOSIS — E7800 Pure hypercholesterolemia, unspecified: Secondary | ICD-10-CM

## 2023-11-07 DIAGNOSIS — E669 Obesity, unspecified: Secondary | ICD-10-CM

## 2023-11-17 ENCOUNTER — Encounter: Payer: Self-pay | Admitting: Pulmonary Disease

## 2023-11-17 ENCOUNTER — Other Ambulatory Visit
Admission: RE | Admit: 2023-11-17 | Discharge: 2023-11-17 | Disposition: A | Discharge status: Home / Self Care | Source: Ambulatory Visit | Attending: Pulmonary Disease | Admitting: Pulmonary Disease

## 2023-11-17 ENCOUNTER — Ambulatory Visit: Admitting: Pulmonary Disease

## 2023-11-17 ENCOUNTER — Ambulatory Visit

## 2023-11-17 VITALS — BP 140/90 | HR 68 | Temp 98.3°F | Ht 70.0 in | Wt 239.2 lb

## 2023-11-17 DIAGNOSIS — J4530 Mild persistent asthma, uncomplicated: Secondary | ICD-10-CM | POA: Diagnosis not present

## 2023-11-17 DIAGNOSIS — D721 Eosinophilia, unspecified: Secondary | ICD-10-CM | POA: Insufficient documentation

## 2023-11-17 DIAGNOSIS — J45909 Unspecified asthma, uncomplicated: Secondary | ICD-10-CM

## 2023-11-17 DIAGNOSIS — R0602 Shortness of breath: Secondary | ICD-10-CM

## 2023-11-17 LAB — CBC WITH DIFFERENTIAL/PLATELET
Abs Immature Granulocytes: 0.03 K/uL (ref 0.00–0.07)
Basophils Absolute: 0 K/uL (ref 0.0–0.1)
Basophils Relative: 0 %
Eosinophils Absolute: 0.1 K/uL (ref 0.0–0.5)
Eosinophils Relative: 2 %
HCT: 38.7 % (ref 36.0–46.0)
Hemoglobin: 12.8 g/dL (ref 12.0–15.0)
Immature Granulocytes: 1 %
Lymphocytes Relative: 27 %
Lymphs Abs: 1.8 K/uL (ref 0.7–4.0)
MCH: 30.3 pg (ref 26.0–34.0)
MCHC: 33.1 g/dL (ref 30.0–36.0)
MCV: 91.7 fL (ref 80.0–100.0)
Monocytes Absolute: 0.7 K/uL (ref 0.1–1.0)
Monocytes Relative: 10 %
Neutro Abs: 3.9 K/uL (ref 1.7–7.7)
Neutrophils Relative %: 60 %
Platelets: 236 K/uL (ref 150–400)
RBC: 4.22 MIL/uL (ref 3.87–5.11)
RDW: 13.8 % (ref 11.5–15.5)
WBC: 6.4 K/uL (ref 4.0–10.5)
nRBC: 0 % (ref 0.0–0.2)

## 2023-11-17 LAB — PULMONARY FUNCTION TEST
DL/VA % pred: 110 %
DL/VA: 4.48 ml/min/mmHg/L
DLCO unc % pred: 104 %
DLCO unc: 25.87 ml/min/mmHg
FEF 25-75 Post: 3.49 L/s
FEF 25-75 Pre: 3.45 L/s
FEF2575-%Change-Post: 1 %
FEF2575-%Pred-Post: 122 %
FEF2575-%Pred-Pre: 121 %
FEV1-%Change-Post: -1 %
FEV1-%Pred-Post: 99 %
FEV1-%Pred-Pre: 101 %
FEV1-Post: 3.23 L
FEV1-Pre: 3.28 L
FEV1FVC-%Change-Post: -3 %
FEV1FVC-%Pred-Pre: 108 %
FEV6-%Change-Post: 1 %
FEV6-%Pred-Post: 97 %
FEV6-%Pred-Pre: 95 %
FEV6-Post: 3.92 L
FEV6-Pre: 3.85 L
FEV6FVC-%Change-Post: 0 %
FEV6FVC-%Pred-Post: 102 %
FEV6FVC-%Pred-Pre: 103 %
FVC-%Change-Post: 2 %
FVC-%Pred-Post: 94 %
FVC-%Pred-Pre: 92 %
FVC-Post: 3.93 L
FVC-Pre: 3.85 L
Post FEV1/FVC ratio: 82 %
Post FEV6/FVC ratio: 100 %
Pre FEV1/FVC ratio: 85 %
Pre FEV6/FVC Ratio: 100 %
RV % pred: 118 %
RV: 2.64 L
TLC % pred: 110 %
TLC: 6.59 L

## 2023-11-17 LAB — NITRIC OXIDE: Nitric Oxide: 20

## 2023-11-17 NOTE — Patient Instructions (Signed)
 VISIT SUMMARY:  Today, we discussed your asthma and shortness of breath. You mentioned that your breathing is manageable with the use of Trelegy at night, and you are taking it consistently. We also talked about the need to complete your allergy blood work and your experience during the lung function test.  YOUR PLAN:  -ASTHMA WITH ASSOCIATED SHORTNESS OF BREATH: Asthma is a condition where your airways become inflamed and narrow, making it hard to breathe. Your asthma is well-controlled with your current treatment, Trelegy, which you should continue taking as prescribed. We need to complete your allergy blood work to identify any allergens that may be affecting your asthma. Additionally, we will perform a test to assess airway inflammation.  INSTRUCTIONS:  Please continue taking Trelegy as prescribed. Complete the allergy blood work at the main hospital lab at your convenience. We will also perform a test to assess airway inflammation. Schedule a follow-up appointment in four months.

## 2023-11-17 NOTE — Patient Instructions (Signed)
 Full PFT completed today ? ?

## 2023-11-17 NOTE — Progress Notes (Signed)
 Full PFT completed today ? ?

## 2023-11-17 NOTE — Progress Notes (Signed)
 Subjective:    Patient ID: Sandra [name], female    DOB: 1966-03-31, 58 y.o.   MRN: [id number]  Patient Care Team: Allwardt, Mardy HERO, PA-C as PCP - General (Physician Assistant) Elnor Rome BROCKS, MD as Referring Physician (Specialist) Vanderbilt Ned, MD as Consulting Physician (General Surgery) Odean Potts, MD as Consulting Physician (Hematology and Oncology) Dewey Rush, MD as Consulting Physician (Radiation Oncology)  Chief Complaint  Patient presents with   Follow-up    BACKGROUND/INTERVAL:Patient is a 58 year old lifelong never smoker with a prior history of breast cancer who presents for follow-up of persistent cough and wheezing and fatigue after a respiratory infection.  She was initially seen here on 05 September 2023.  She was noted to have a right middle lobe pneumonia.  Nitric oxide  on that visit was 20 however the patient was on steroids at the time.  She was last seen on 30 September 2023 and at that time her right middle lobe pneumonia has shown significant improvement.  Nitric oxide  level at that time was 37 ppb.  She presents today for follow-up on those issues.  She had PFTs today.  HPI Discussed the use of AI scribe software for clinical note transcription with the patient, who gave verbal consent to proceed.  History of Present Illness   Sandra [name] is a 58 year old female with asthma who presents with shortness of breath.  She experiences shortness of breath but describes her breathing as 'all right' with the use of Trelegy at night. She believes this medication keeps her symptoms well controlled and takes it consistently at the same time every day.  She has not completed her allergy blood work, which was previously ordered. She was unsure about the process and location for the test but plans to complete it at her convenience at the main hospital lab.  Recall that she had significant eosinophilia previously noted.  She did not feel well during the lung function  test. She expressed concern about the difficulty of the test, stating 'I thought I was going to die.'  She actually performed very well during the test.  We discussed the results.    DATA 11/17/2023 PFTs: FEV1 3.28 L or 101% predicted, FVC 3.85 L or 92% predicted, FEV1/FVC 85%, no bronchodilator response.  Lung volumes are normal with some very mild air trapping noted.  Diffusion capacity normal.  Study consistent with normal lung function.  Review of Systems A 10 point review of systems was performed and it is as noted above otherwise negative.   Patient Active Problem List   Diagnosis Date Noted   HX: breast cancer 10/31/2023   BMI 33.0-33.9,adult 10/31/2023   Vaginal atrophy 10/31/2023   Hot flash, menopausal 10/31/2023   Family history of malignant neoplasm of gastrointestinal tract 01/29/2023   Acute eczema 01/29/2023   Change in bowel habit 01/29/2023   Feces contents abnormal 01/29/2023   Nasal congestion 01/29/2023   Overweight 01/29/2023   Pain in joint of left knee 10/16/2022   Genetic testing 01/05/2018   Family history of prostate cancer    Family history of colon cancer    Port-A-Cath in place 12/10/2017   Malignant neoplasm of upper-outer quadrant of right breast in female, estrogen receptor positive (HCC) 11/14/2017   Melanocytic nevus of trunk 10/23/2017   Other seborrheic keratosis 10/23/2017   Hemangioma of skin and subcutaneous tissue 10/23/2017   Other melanin hyperpigmentation 10/23/2017   Skin mole 04/21/2017   Depression, recurrent (HCC) 04/21/2017  Obesity (BMI 30-39.9) 04/21/2017   Pure hypercholesterolemia 04/21/2017   Vitamin D  deficiency 04/21/2017   Lichen sclerosus et atrophicus of the vulva 04/21/2017   Lack of libido 01/13/2017   Bruxism 01/08/2016   Other and unspecified hyperlipidemia 02/11/2013    Social History   Tobacco Use   Smoking status: Never   Smokeless tobacco: Never  Substance Use Topics   Alcohol use: No    Alcohol/week:  0.0 standard drinks of alcohol    No Known Allergies  No outpatient medications have been marked as taking for the 11/17/23 encounter (Office Visit) with Tamea Dedra CROME, MD.    Immunization History  Administered Date(s) Administered   Influenza, Seasonal, Injecte, Preservative Fre 01/29/2023   Influenza,inj,Quad PF,6+ Mos 02/10/2017, 02/12/2018, 02/26/2021   Influenza-Unspecified 09/13/2021   Moderna Sars-Covid-2 Vaccination 06/23/2019, 07/21/2019, 08/20/2019   Tdap 08/06/2011, 12/07/2015   Zoster Recombinant(Shingrix) 05/16/2021, 10/15/2021        Objective:     BP (!) 140/90 (BP Location: Left Arm, Patient Position: Sitting, Cuff Size: Normal)   Pulse 68   Temp 98.3 F (36.8 C) (Oral)   Ht 5' 10 (1.778 m)   Wt 239 lb 3.2 oz (108.5 kg)   SpO2 100%   BMI 34.32 kg/m   SpO2: 100 %  GENERAL: Well-developed, overweight woman, no acute distress.  Fully ambulatory, no conversational dyspnea. HEAD: Normocephalic, atraumatic.  EYES: Pupils equal, round, reactive to light.  No scleral icterus.  MOUTH: Dentition intact, oral mucosa moist.  No thrush. NECK: Supple. No thyromegaly. Trachea midline. No JVD.  No adenopathy. PULMONARY: Good air entry bilaterally.  No adventitious sounds. CARDIOVASCULAR: S1 and S2. Regular rate and rhythm.  No rubs, murmurs or gallops heard. ABDOMEN: Benign. MUSCULOSKELETAL: No joint deformity, no clubbing, no edema.  NEUROLOGIC: No overt focal deficit, no gait disturbance, speech is fluent. SKIN: Intact,warm,dry. PSYCH: Mood and behavior normal.  Lab Results  Component Value Date   NITRICOXIDE 20 11/17/2023  *Low level nitric oxide  consistent with no significant  type II inflammation.       Assessment & Plan:     ICD-10-CM   1. Mild persistent asthma without complication  J45.30 Nitric oxide     2. Eosinophilia, unspecified type  D72.10       Orders Placed This Encounter  Procedures   Nitric oxide    Discussion:    Asthma  with associated shortness of breath Asthma is well-controlled with current treatment regimen. Lung function is very good, and auscultation reveals clear breath sounds. Previous airway inflammation and eosinophilia necessitate further allergy testing. - Continue Trelegy as currently prescribed. - Complete CBC and allergen panel. - Perform test to assess airway inflammation: No significant type II inflammation noted today, nitric oxide  20 ppb. - Schedule follow-up appointment in four months.    Advised if symptoms do not improve or worsen, to please contact office for sooner follow up or seek emergency care.    I spent 30 minutes of dedicated to the care of this patient on the date of this encounter to include pre-visit review of records, face-to-face time with the patient discussing conditions above, post visit ordering of testing, clinical documentation with the electronic health record, making appropriate referrals as documented, and communicating necessary findings to members of the patients care team.     C. Leita Tamea, MD Advanced Bronchoscopy PCCM [facility]    *This note was generated using voice recognition software/Dragon and/or AI transcription program.  Despite best efforts to proofread, errors can occur  which can change the meaning. Any transcriptional errors that result from this process are unintentional and may not be fully corrected at the time of dictation.

## 2023-11-20 LAB — ALLERGEN PANEL (27) + IGE
Alternaria Alternata IgE: <0.1 kU/L
Aspergillus Fumigatus IgE: <0.1 kU/L
Bahia Grass IgE: <0.1 kU/L
Bermuda Grass IgE: <0.1 kU/L
Cat Dander IgE: 0.13 kU/L — AB
Cedar, Mountain IgE: <0.1 kU/L
Cladosporium Herbarum IgE: <0.1 kU/L
Cocklebur IgE: <0.1 kU/L
Cockroach, American IgE: 0.79 kU/L — AB
Common Silver Birch IgE: <0.1 kU/L
D Farinae IgE: 0.39 kU/L — AB
D Pteronyssinus IgE: 0.35 kU/L — AB
Dog Dander IgE: 0.27 kU/L — AB
Elm, American IgE: <0.1 kU/L
Hickory, White IgE: <0.1 kU/L
IgE (Immunoglobulin E), Serum: 61 [IU]/mL (ref 6–495)
Johnson Grass IgE: <0.1 kU/L
Kentucky Bluegrass IgE: <0.1 kU/L
Maple/Box Elder IgE: <0.1 kU/L
Mucor Racemosus IgE: <0.1 kU/L
Oak, White IgE: <0.1 kU/L
Penicillium Chrysogen IgE: <0.1 kU/L
Pigweed, Rough IgE: <0.1 kU/L
Plantain, English IgE: <0.1 kU/L
Ragweed, Short IgE: <0.1 kU/L
Setomelanomma Rostrat: <0.1 kU/L
Timothy Grass IgE: <0.1 kU/L
White Mulberry IgE: <0.1 kU/L

## 2023-11-26 ENCOUNTER — Ambulatory Visit: Payer: Self-pay | Admitting: Pulmonary Disease

## 2023-11-27 ENCOUNTER — Ambulatory Visit: Admitting: Nurse Practitioner

## 2023-11-27 ENCOUNTER — Encounter: Payer: Self-pay | Admitting: Nurse Practitioner

## 2023-11-27 VITALS — BP 127/73 | HR 70 | Temp 98.2°F | Ht 70.0 in | Wt 232.0 lb

## 2023-11-27 DIAGNOSIS — R7303 Prediabetes: Secondary | ICD-10-CM | POA: Diagnosis not present

## 2023-11-27 DIAGNOSIS — E7849 Other hyperlipidemia: Secondary | ICD-10-CM

## 2023-11-27 DIAGNOSIS — E66811 Obesity, class 1: Secondary | ICD-10-CM

## 2023-11-27 DIAGNOSIS — Z6833 Body mass index (BMI) 33.0-33.9, adult: Secondary | ICD-10-CM | POA: Diagnosis not present

## 2023-11-27 NOTE — Progress Notes (Signed)
 Office: [phone]  /  Fax: [phone]   Initial Visit  Sandra [name] was seen in clinic today to evaluate for obesity. She is interested in losing weight to improve overall health and reduce the risk of weight related complications. She presents today to review program treatment options, initial physical assessment, and evaluation.     She was referred by: Specialist  When asked what else they would like to accomplish? She states: Adopt a healthier eating pattern and lifestyle, Improve energy levels and physical activity, Improve existing medical conditions, and Improve quality of life  Some associated conditions: Hyperlipidemia, vitamin D  deficiency, history of breast cancer, asthma, anxiety, depression, prediabetes, history of low vitamin B12  Contributing factors: family history of obesity, moderate to high levels of stress, reduced physical activity, chronic skipping of meals, menopause, and sedentary job  Weight promoting medications identified: None  Current nutrition plan: None  Current level of physical activity: None  Current or previous pharmacotherapy: None  Response to medication: Never tried medications   Past medical history includes:   Past Medical History:  Diagnosis Date   Breast cancer (HCC) 11/2017   rt.Br.   Depression    Endometriosis, diagnosis via laparoscopy    Family history of breast cancer    Family history of breast cancer    Family history of colon cancer    Family history of prostate cancer    Hyperlipidemia 2017   Lichen sclerosus, vulva    Low serum vitamin D  2017   Personal history of chemotherapy    Personal history of radiation therapy      Objective:   BP 127/73   Pulse 70   Temp 98.2 F (36.8 C)   Ht 5' 10 (1.778 m)   Wt 232 lb (105.2 kg)   LMP  (LMP Unknown)   SpO2 97%   BMI 33.29 kg/m  She was weighed on the bioimpedance scale: Body mass index is 33.29 kg/m.  Peak Weight:240 lbs , Body Fat%:44.3%, Visceral  Fat Rating:12, Weight trend over the last 12 months: Unchanged  General:  Alert, oriented and cooperative. Patient is in no acute distress.  Respiratory: Normal respiratory effort, no problems with respiration noted   Gait: able to ambulate independently  Mental Status: Normal mood and affect. Normal behavior. Normal judgment and thought content.   DIAGNOSTIC DATA REVIEWED:  BMET    Component Value Date/Time   NA 140 10/31/2023 1052   K 4.1 10/31/2023 1052   CL 103 10/31/2023 1052   CO2 22 10/31/2023 1052   GLUCOSE 89 10/31/2023 1052   GLUCOSE 90 01/29/2023 1008   BUN 12 10/31/2023 1052   CREATININE 0.90 10/31/2023 1052   CREATININE 0.99 12/22/2018 0830   CREATININE 0.76 12/07/2015 1004   CALCIUM 9.5 10/31/2023 1052   GFRNONAA >60 12/22/2018 0830   GFRAA >60 12/22/2018 0830   Lab Results  Component Value Date   HGBA1C 5.7 (H) 10/31/2023   HGBA1C 5.4 04/21/2017   No results found for: INSULIN  CBC    Component Value Date/Time   WBC 6.4 11/17/2023 1552   RBC 4.22 11/17/2023 1552   HGB 12.8 11/17/2023 1552   HGB 13.3 10/31/2023 1052   HGB 13.3 11/11/2014 0854   HCT 38.7 11/17/2023 1552   HCT 40.3 10/31/2023 1052   PLT 236 11/17/2023 1552   PLT 267 10/31/2023 1052   MCV 91.7 11/17/2023 1552   MCV 92 10/31/2023 1052   MCH 30.3 11/17/2023 1552   MCHC 33.1 11/17/2023 1552  RDW 13.8 11/17/2023 1552   RDW 13.6 10/31/2023 1052   Iron/TIBC/Ferritin/ %Sat No results found for: IRON, TIBC, FERRITIN, IRONPCTSAT Lipid Panel     Component Value Date/Time   CHOL 248 (H) 10/31/2023 1052   TRIG 140 10/31/2023 1052   HDL 54 10/31/2023 1052   CHOLHDL 4.6 (H) 10/31/2023 1052   CHOLHDL 5 02/26/2021 0808   VLDL 39.0 02/26/2021 0808   LDLCALC 169 (H) 10/31/2023 1052   Hepatic Function Panel     Component Value Date/Time   PROT 7.2 10/31/2023 1052   ALBUMIN 4.4 10/31/2023 1052   AST 22 10/31/2023 1052   AST 38 12/22/2018 0830   ALT 25 10/31/2023 1052   ALT 38  12/22/2018 0830   ALKPHOS 66 10/31/2023 1052   BILITOT 0.4 10/31/2023 1052   BILITOT 0.4 12/22/2018 0830      Component Value Date/Time   TSH 1.530 10/31/2023 1052     Assessment and Plan:   Prediabetes Last A1c was 5.7 on 10/31/23-reviewed in chart.     Other hyperlipidemia Last lipids was 10/31/23-reviewed in chart.  To continue following up with PCP.  Obesity, Class I, BMI 30-34.9        Obesity Treatment / Action Plan:  Patient will work on garnering support from family and friends to begin weight loss journey. Will work on eliminating or reducing the presence of highly palatable, calorie dense foods in the home. Will complete provided nutritional and psychosocial assessment questionnaire before the next appointment. Will be scheduled for indirect calorimetry to determine resting energy expenditure in a fasting state.  This will allow us  to create a reduced calorie, high-protein meal plan to promote loss of fat mass while preserving muscle mass. Counseled on the health benefits of losing 5%-15% of total body weight. Was counseled on nutritional approaches to weight loss and benefits of reducing processed foods and consuming plant-based foods and high quality protein as part of nutritional weight management. Was counseled on pharmacotherapy and role as an adjunct in weight management.   Obesity Education Performed Today:  She was weighed on the bioimpedance scale and results were discussed and documented in the synopsis.  We discussed obesity as a disease and the importance of a more detailed evaluation of all the factors contributing to the disease.  We discussed the importance of long term lifestyle changes which include nutrition, exercise and behavioral modifications as well as the importance of customizing this to her specific health and social needs.  We discussed the benefits of reaching a healthier weight to alleviate the symptoms of existing conditions and reduce  the risks of the biomechanical, metabolic and psychological effects of obesity.  Sandra [name] appears to be in the action stage of change and states they are ready to start intensive lifestyle modifications and behavioral modifications.  30 minutes was spent today on this visit including the above counseling, pre-visit chart review, and post-visit documentation.  Reviewed by clinician on day of visit: allergies, medications, problem list, medical history, surgical history, family history, social history, and previous encounter notes pertinent to obesity diagnosis.    Corean SAUNDERS Annabella Elford FNP-C

## 2023-12-02 ENCOUNTER — Encounter: Payer: Self-pay | Admitting: Pulmonary Disease

## 2023-12-15 ENCOUNTER — Telehealth: Payer: Self-pay

## 2023-12-15 DIAGNOSIS — Z170 Estrogen receptor positive status [ER+]: Secondary | ICD-10-CM

## 2023-12-15 NOTE — Telephone Encounter (Signed)
 [id number] Understanding and Predicting Breast Cancer Events after Treatment (UPBEAT)  Study Follow-up - Year 6   Ms. Gergen was contacted by phone regarding the Year 6 follow-up for the above study. Patient reported doing well and denied any hospitalizations since the last follow-up call on 01/27/2023. She also denied experiencing any cardiac events, including myocardial infarction (MI), percutaneous coronary intervention (PCI), coronary artery bypass grafting (CABG), heart catheterization, cerebrovascular accident (CVA), or a diagnosis of heart failure since the last contact.   Patient confirmed receipt of the study email regarding the annual follow-up questionnaire. She has completed the [employer] questionnaire online.  Patient confirmed that her email address remains the same. She was informed that the next follow-up call will be in approximately one year.    Patient stated she has no questions at this time and she knows to contact the research team with any study-related concerns in the future. Patient was thanked for her time and for her continued participation in the study.   Benjy Kana, Ph.D. Clinical Research Coordinator [phone] 12/15/2023 1:39 PM

## 2023-12-16 DIAGNOSIS — F330 Major depressive disorder, recurrent, mild: Secondary | ICD-10-CM | POA: Diagnosis not present

## 2023-12-25 ENCOUNTER — Ambulatory Visit: Admitting: Family Medicine

## 2023-12-25 ENCOUNTER — Encounter: Payer: Self-pay | Admitting: Family Medicine

## 2023-12-25 VITALS — BP 124/74 | HR 80 | Temp 98.7°F | Ht 70.0 in | Wt 232.0 lb

## 2023-12-25 DIAGNOSIS — R0602 Shortness of breath: Secondary | ICD-10-CM

## 2023-12-25 DIAGNOSIS — E66811 Obesity, class 1: Secondary | ICD-10-CM

## 2023-12-25 DIAGNOSIS — F32A Depression, unspecified: Secondary | ICD-10-CM | POA: Diagnosis not present

## 2023-12-25 DIAGNOSIS — F419 Anxiety disorder, unspecified: Secondary | ICD-10-CM | POA: Diagnosis not present

## 2023-12-25 DIAGNOSIS — G4730 Sleep apnea, unspecified: Secondary | ICD-10-CM

## 2023-12-25 DIAGNOSIS — R7303 Prediabetes: Secondary | ICD-10-CM

## 2023-12-25 DIAGNOSIS — K760 Fatty (change of) liver, not elsewhere classified: Secondary | ICD-10-CM

## 2023-12-25 DIAGNOSIS — R5383 Other fatigue: Secondary | ICD-10-CM

## 2023-12-25 DIAGNOSIS — Z853 Personal history of malignant neoplasm of breast: Secondary | ICD-10-CM

## 2023-12-25 DIAGNOSIS — Z6833 Body mass index (BMI) 33.0-33.9, adult: Secondary | ICD-10-CM

## 2023-12-25 NOTE — Assessment & Plan Note (Signed)
 The 10-year ASCVD risk score (Arnett DK, et al., 2019) is: 2.8%   Values used to calculate the score:     Age: 58 years     Clincally relevant sex: Female     Is Non-Hispanic African American: No     Diabetic: No     Tobacco smoker: No     Systolic Blood Pressure: 124 mmHg     Is BP treated: No     HDL Cholesterol: 54 mg/dL     Total Cholesterol: 248 mg/dL

## 2023-12-25 NOTE — Progress Notes (Signed)
 At a Glance:  Vitals Temp: 98.7 F (37.1 C) BP: 124/74 Pulse Rate: 80 SpO2: 97 %   Anthropometric Measurements Height: 5' 10 (1.778 m) Weight: 232 lb (105.2 kg) BMI (Calculated): 33.29 Starting Weight: 232lb Peak Weight: 240lb   Body Composition  Body Fat %: 43.7 % Fat Mass (lbs): 101.6 lbs Muscle Mass (lbs): 124.4 lbs Total Body Water (lbs): 84.2 lbs Visceral Fat Rating : 12   Other Clinical Data RMR: 1714 Fasting: No Labs: No Today's Visit #: 1 Starting Date: 12/25/23    EKG: Normal sinus rhythm, rate 79.  Indirect Calorimeter completed today shows a VO2 of 249 and a REE of 1714.  Her calculated basal metabolic rate is 8179 thus her basal metabolic rate is worse than expected.  Chief Complaint:  Obesity   Subjective:  Sandra [name] (MR# [id number]) is a 58 y.o. female who presents for evaluation and treatment of obesity and related comorbidities.   Sandra [name] is currently in the action stage of change and ready to dedicate time achieving and maintaining a healthier weight. Sandra [name] is interested in becoming our patient and working on intensive lifestyle modifications including (but not limited to) diet and exercise for weight loss.  Sandra [name] has been struggling with her weight. She has been unsuccessful in either losing weight, maintaining weight loss, or reaching her healthy weight goal.  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  Her job is sedentary.  She lives with her husband and 2 daughters.  Her youngest is a Holiday representative in high school.  She would like to be about 180 pounds.  She has a history of left breast cancer on Femara  for 1 more year, managed by Dr. Odean. She lacks regular exercise, limited by poor motivation.  She has a positive family history of obesity including her dad and siblings.  She does not like to cook and advised over snacking.  She has never used antiobesity medications.  Sandra [name]'s habits were reviewed today and are as follows: she snacks  frequently in the evenings, she is frequently drinking liquids with calories, she frequently makes poor food choices, and she struggles with emotional eating.   Other Fatigue Sandra [name] admits to daytime somnolence and admits to waking up still tired. Patient has a history of symptoms of daytime fatigue. Sandra [name] generally gets 7 or 8 hours of sleep per night, and states that she has nightime awakenings. Snoring is present. Apneic episodes are not present. Epworth Sleepiness Score is 15.  She has a history of mild OSA using an oral appliance  Shortness of Breath Sandra [name] notes increasing shortness of breath with exercising and seems to be worsening over time with weight gain. She notes getting out of breath sooner with activity than she used to. This has gotten worse recently. Seeley denies shortness of breath at rest or orthopnea.   Depression Screen Sandra [name]'s Food and Mood (modified PHQ-9) score was 16.     01/29/2023    9:08 AM  Depression screen PHQ 2/9  Decreased Interest 1  Down, Depressed, Hopeless 0  PHQ - 2 Score 1  Altered sleeping 1  Tired, decreased energy 2  Change in appetite 3  Feeling bad or failure about yourself  1  Trouble concentrating 0  Moving slowly or fidgety/restless 0  Suicidal thoughts 0  PHQ-9 Score 8  Difficult doing work/chores Not difficult at all     Assessment and Plan:   Other Fatigue Sandra [name] does feel that her weight is causing her energy  to be lower than it should be. Fatigue may be related to obesity, depression or many other causes. Labs will be ordered, and in the meanwhile, Sandra [name] will focus on self care including making healthy food choices, increasing physical activity and focusing on stress reduction.  Shortness of Breath Sandra [name] does feel that she gets out of breath more easily that she used to when she exercises. Sandra [name]'s shortness of breath appears to be obesity related and exercise induced. She has agreed to work on weight loss and gradually increase exercise  to treat her exercise induced shortness of breath. Will continue to monitor closely.  Sandra [name] had a positive depression screening. Depression is commonly associated with obesity and often results in emotional eating behaviors. We will monitor this closely and work on CBT to help improve the non-hunger eating patterns. Referral to Psychology may be required if no improvement is seen as she continues in our clinic.    Problem List Items Addressed This Visit     BMI 33.0-33.9,adult   Other Visit Diagnoses       SOBOE (shortness of breath on exertion)    -  Primary     Other fatigue       Relevant Orders   EKG 12-Lead (Completed)   Folate     Prediabetes   Lab Results  Component Value Date   HGBA1C 5.7 (H) 10/31/2023  Her A1c is in the prediabetic range.  She has never used metformin.  Her diet is currently high in starch and sugar intake with a lack of regular exercise.  Begin working on prescribed dietary plan.  Check fasting insulin  today.      Relevant Orders   Insulin , random     Obesity, Class I, BMI 30-34.9          Hepatic steatosis     She has a history of hepatic steatosis though there is no imaging in her chart to confirm this.  Will obtain labs today calculate a fib 4 score report.  If over 1.3, will obtain elastography.  Will consider use of Wegovy for the treatment of hepatic steatosis.      History of breast cancer     Currently on letrozole  2.5 mg, one half tab once daily with Dr. Gudena.  Her diagnosis was in 2019 and she is doing quite well.  Begin working on reducing body fat percent to reduce future risk of weight related cancers.      Anxiety and depression  She is currently on Trintellix  20 mg daily by psychiatry.  She notes some emotional eating and has lacked motivation to make much lifestyle change.  We discussed the importance of addressing mood and active plan for weight reduction.  Consider the addition of cognitive behavioral therapy.         Mild sleep  apnea She has a history of mild OSA using a oral appliance.  She does not feel like she is sleeping very well with an oral appliance and questions whether she needs another sleep study.  She may discuss this with her PCP at her upcoming visit.  Begin active plan for weight reduction.           Sandra [name] is currently in the action stage of change and her goal is to get back to weightloss efforts . I recommend Sandra [name] begin the structured treatment plan as follows:  She has agreed to Category 3 Plan  Exercise goals: All adults should avoid inactivity. Some activity is better than none,  and adults who participate in any amount of physical activity, gain some health benefits.  Behavioral modification strategies:increasing lean protein intake, increasing vegetables, increase H2O intake, decrease liquid calories, increase high fiber foods, decreasing eating out, no skipping meals, meal planning and cooking strategies, keeping healthy foods in the home, better snacking choices, avoiding temptations, and decrease junk food   She was informed of the importance of frequent follow-up visits to maximize her success with intensive lifestyle modifications for her multiple health conditions. She was informed we would discuss her lab results at her next visit unless there is a critical issue that needs to be addressed sooner. Sandra [name] agreed to keep her next visit at the agreed upon time to discuss these results.  Objective:  General: Cooperative, alert, well developed, in no acute distress. HEENT: Conjunctivae and lids unremarkable. Cardiovascular: Regular rhythm.  Lungs: Normal work of breathing. Neurologic: No focal deficits.   Lab Results  Component Value Date   CREATININE 0.90 10/31/2023   BUN 12 10/31/2023   NA 140 10/31/2023   K 4.1 10/31/2023   CL 103 10/31/2023   CO2 22 10/31/2023   Lab Results  Component Value Date   ALT 25 10/31/2023   AST 22 10/31/2023   ALKPHOS 66 10/31/2023   BILITOT 0.4  10/31/2023   Lab Results  Component Value Date   HGBA1C 5.7 (H) 10/31/2023   HGBA1C 5.8 01/29/2023   HGBA1C 5.4 04/21/2017   No results found for: INSULIN  Lab Results  Component Value Date   TSH 1.530 10/31/2023   Lab Results  Component Value Date   CHOL 248 (H) 10/31/2023   HDL 54 10/31/2023   LDLCALC 169 (H) 10/31/2023   TRIG 140 10/31/2023   CHOLHDL 4.6 (H) 10/31/2023   Lab Results  Component Value Date   WBC 6.4 11/17/2023   HGB 12.8 11/17/2023   HCT 38.7 11/17/2023   MCV 91.7 11/17/2023   PLT 236 11/17/2023   No results found for: IRON, TIBC, FERRITIN  Attestation Statements:  Reviewed by clinician on day of visit: allergies, medications, problem list, medical history, surgical history, family history, social history, and previous encounter notes.  Time spent on visit including pre-visit chart review and post-visit charting and face- to face care including nutritional counseling, review of EKG, interpretation of body composition scale and indirect calorimetry and nutrition prescription  was 40 minutes.   Darice Haddock, D.O. DABFM, DABOM [facility] [address] [place], [place] [address] [phone]

## 2023-12-26 LAB — FOLATE: Folate: 18.1 ng/mL (ref >3.0)

## 2023-12-26 LAB — INSULIN, RANDOM: INSULIN: 14.2 u[IU]/mL (ref 2.6–24.9)

## 2023-12-29 ENCOUNTER — Ambulatory Visit: Payer: Self-pay | Admitting: Family Medicine

## 2023-12-31 ENCOUNTER — Inpatient Hospital Stay: Payer: BC Managed Care – PPO | Attending: Hematology and Oncology | Admitting: Hematology and Oncology

## 2023-12-31 VITALS — BP 124/64 | HR 85 | Temp 98.0°F | Resp 16 | Wt 232.7 lb

## 2023-12-31 DIAGNOSIS — Z79811 Long term (current) use of aromatase inhibitors: Secondary | ICD-10-CM | POA: Diagnosis not present

## 2023-12-31 DIAGNOSIS — Z923 Personal history of irradiation: Secondary | ICD-10-CM | POA: Diagnosis not present

## 2023-12-31 DIAGNOSIS — C50411 Malignant neoplasm of upper-outer quadrant of right female breast: Secondary | ICD-10-CM | POA: Diagnosis not present

## 2023-12-31 DIAGNOSIS — Z9221 Personal history of antineoplastic chemotherapy: Secondary | ICD-10-CM | POA: Diagnosis not present

## 2023-12-31 DIAGNOSIS — Z1731 Human epidermal growth factor receptor 2 positive status: Secondary | ICD-10-CM | POA: Insufficient documentation

## 2023-12-31 DIAGNOSIS — Z1721 Progesterone receptor positive status: Secondary | ICD-10-CM | POA: Insufficient documentation

## 2023-12-31 DIAGNOSIS — Z170 Estrogen receptor positive status [ER+]: Secondary | ICD-10-CM | POA: Insufficient documentation

## 2023-12-31 MED ORDER — LETROZOLE 2.5 MG PO TABS: ORAL_TABLET | ORAL | 3 refills | Status: AC

## 2023-12-31 NOTE — Progress Notes (Signed)
 Patient Care Team: Allwardt, Mardy HERO, PA-C as PCP - General (Physician Assistant) Elnor Rome BROCKS, MD as Referring Physician (Specialist) Vanderbilt Ned, MD as Consulting Physician (General Surgery) Odean Potts, MD as Consulting Physician (Hematology and Oncology) Dewey Rush, MD as Consulting Physician (Radiation Oncology)  DIAGNOSIS:  Encounter Diagnosis  Name Primary?   Malignant neoplasm of upper-outer quadrant of right breast in female, estrogen receptor positive (HCC) Yes    SUMMARY OF ONCOLOGIC HISTORY: Oncology History  Malignant neoplasm of upper-outer quadrant of right breast in female, estrogen receptor positive (HCC)  11/11/2017 Initial Diagnosis   Palpable right breast mass at 10 o'clock position 1.7 cm, axilla negative, biopsy revealed grade 3 IDC ER 60%, PR 10%, Ki-67 70%, HER-2 positive, T1 CN 0 stage Ia   11/19/2017 Cancer Staging   Staging form: Breast, AJCC 8th Edition - Clinical: Stage IB (cT2, cN0, cM0, G3, ER+, PR+, HER2+) - Signed by Odean Potts, MD on 12/04/2017   11/25/2017 Breast MRI   4.4 x 2.8 x 2.1 cm right breast malignancy UOQ, second focus 0.8 cm (biopsy planned for 12/09/2017)   12/04/2017 - 03/26/2018 Neo-Adjuvant Chemotherapy   TCH Perjeta  every 3 week x6 followed by Herceptin  Perjeta  maintenance   01/04/2018 Genetic Testing   Negative genetic testing on the multi-cancer panel.  The Multi-Gene Panel offered by [facility] includes sequencing and/or deletion duplication testing of the following 84 genes: AIP, ALK, APC, ATM, AXIN2,BAP1,  BARD1, BLM, BMPR1A, BRCA1, BRCA2, BRIP1, CASR, CDC73, CDH1, CDK4, CDKN1B, CDKN1C, CDKN2A (p14ARF), CDKN2A (p16INK4a), CEBPA, CHEK2, CTNNA1, DICER1, DIS3L2, EGFR (c.2369C>T, p.Thr790Met variant only), EPCAM (Deletion/duplication testing only), FH, FLCN, GATA2, GPC3, GREM1 (Promoter region deletion/duplication testing only), HOXB13 (c.251G>A, p.Gly84Glu), HRAS, KIT, MAX, MEN1, MET, MITF (c.952G>A, p.Glu318Lys variant only),  MLH1, MSH2, MSH3, MSH6, MUTYH, NBN, NF1, NF2, NTHL1, PALB2, PDGFRA, PHOX2B, PMS2, POLD1, POLE, POT1, PRKAR1A, PTCH1, PTEN, RAD50, RAD51C, RAD51D, RB1, RECQL4, RET, RUNX1, SDHAF2, SDHA (sequence changes only), SDHB, SDHC, SDHD, SMAD4, SMARCA4, SMARCB1, SMARCE1, STK11, SUFU, TERC, TERT, TMEM127, TP53, TSC1, TSC2, VHL, WRN and WT1.  The report date is January 04, 2018.   04/23/2018 Surgery   Right lumpectomy ([facility]) [phone]): Small residual invasive cancer multifocal 0.15 cm, grade 2, 0/3 lymph nodes all negative, ER 90%, PR 60%, HER-2 +3+ by IHC, Ki-67 20%, RCB class I, ympT1a, ypN0   05/07/2018 - 12/03/2018 Chemotherapy   The patient had ado-trastuzumab emtansine  (KADCYLA ) 300 mg in sodium chloride  0.9 % 250 mL chemo infusion, 3.6 mg/kg = 300 mg, Intravenous, Once, 11 of 11 cycles Dose modification: 2.4 mg/kg (original dose 3.6 mg/kg, Cycle 8, Reason: Dose not tolerated) Administration: 300 mg (05/07/2018), 300 mg (05/28/2018), 300 mg (07/30/2018), 300 mg (08/20/2018), 300 mg (09/10/2018), 200 mg (10/01/2018), 200 mg (10/22/2018), 200 mg (11/12/2018), 200 mg (12/03/2018), 300 mg (06/18/2018), 300 mg (07/09/2018)  for chemotherapy treatment.    06/01/2018 - 07/15/2018 Radiation Therapy   The patient initially received a dose of 50.4 Gy in 28 fractions to the right breast using whole-breast tangent fields. This was delivered using a 3-D conformal technique. The patient then received a boost to the seroma. This delivered an additional 10 Gy in 5 fractions using 6X photons with a Complex Isodose technique. The total dose was 60.4 Gy.   12/2018 -  Anti-estrogen oral therapy   Anastrozole  switched to letrozole  every other day.  Plan is for 7 years     CHIEF COMPLIANT: Follow-up on anastrozole  therapy  HISTORY OF PRESENT ILLNESS: History of Present Illness Ronal Fees  Muntean is a 58 year old female with breast cancer who presents for routine follow-up.  She takes methotrexate, half a tablet. A mammogram earlier this  year was normal. Her bone density was checked last year with good results.  She experiences occasional itching in the area of radiation treatment, using a lotion that alleviates the symptom. The area is described as 'very hard', raising questions about its normalcy.  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 She acknowledges the importance of exercise but finds it challenging to maintain a routine.     ALLERGIES:  has no known allergies.  MEDICATIONS:  Current Outpatient Medications  Medication Sig Dispense Refill   betamethasone  valerate ointment (VALISONE ) 0.1 % APPLY TWICE A DAY FOR 1-2 WEEKS AS NEEDED FOR FLARE OF LICHEN SCLEROSIS. 15 g 1   clobetasol  ointment (TEMOVATE ) 0.05 % Apply twice a day to affected area for 2 weeks then twice weekly 60 g 5   clotrimazole  (LOTRIMIN ) 1 % cream APPLY TO AFFECTED AREA TWICE A DAY 45 g 0   Fluticasone-Umeclidin-Vilant (TRELEGY ELLIPTA ) 100-62.5-25 MCG/ACT AEPB Inhale 1 puff into the lungs daily. 28 each 11   Fluticasone-Umeclidin-Vilant (TRELEGY ELLIPTA ) 100-62.5-25 MCG/ACT AEPB Inhale 1 puff into the lungs daily in the afternoon.     letrozole  (FEMARA ) 2.5 MG tablet TAKE HALF A TABLET BY MOUTH DAILY. 45 tablet 3   OVER THE COUNTER MEDICATION Take 3 capsules by mouth daily. Probiome complex     vortioxetine  HBr (TRINTELLIX ) 20 MG TABS tablet Take one tab daily 90 tablet 0   No current facility-administered medications for this visit.    PHYSICAL EXAMINATION: ECOG PERFORMANCE STATUS: 1 - Symptomatic but completely ambulatory  Vitals:   12/31/23 0808  BP: 124/64  Pulse: 85  Resp: 16  Temp: 98 F (36.7 C)  SpO2: 98%   Filed Weights   12/31/23 0808  Weight: 232 lb 11.2 oz (105.6 kg)    Physical Exam BREAST: Breasts without suspicious masses.  (exam performed in the presence of a chaperone)  LABORATORY DATA:  I have reviewed the  data as listed    Latest Ref Rng & Units 10/31/2023   10:52 AM 01/29/2023   10:08 AM 02/26/2021    8:08 AM  CMP  Glucose 70 - 99 mg/dL 89  90  96   BUN 6 - 24 mg/dL 12  11  16    Creatinine 0.57 - 1.00 mg/dL 9.09  8.96  9.02   Sodium 134 - 144 mmol/L 140  139  139   Potassium 3.5 - 5.2 mmol/L 4.1  4.5  4.3   Chloride 96 - 106 mmol/L 103  104  103   CO2 20 - 29 mmol/L 22  29  26    Calcium 8.7 - 10.2 mg/dL 9.5  9.5  89.9   Total Protein 6.0 - 8.5 g/dL 7.2  7.3  7.3   Total Bilirubin 0.0 - 1.2 mg/dL 0.4  0.5  0.5   Alkaline Phos 44 - 121 IU/L 66  59  58   AST 0 - 40 IU/L 22  17  17    ALT 0 - 32 IU/L 25  22  22      Lab Results  Component Value Date   WBC 6.4 11/17/2023   HGB 12.8 11/17/2023   HCT 38.7 11/17/2023   MCV 91.7 11/17/2023   PLT 236 11/17/2023  NEUTROABS 3.9 11/17/2023    ASSESSMENT & PLAN:  Malignant neoplasm of upper-outer quadrant of right breast in female, estrogen receptor positive (HCC) 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%, PR 10%, Ki-67 70%, HER-2 positive, T1 CN 0 stage Ia Breast MRI 11/25/2017: 4.4 x 2.8 x 2.1 cm right breast malignancy UOQ, second focus 0.8 cm (biopsy planned for 12/09/2017), overall extent of disease is 7 cm 12/12/2017:Biopsy results of the MRI guided biopsies 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  Neoadjuvant chemotherapy with TCH Perjeta  12/04/2017-03/26/2018 followed by Kadcyla  x 1 year completed 12/03/2018 2. 04/23/2018:Right lumpectomy: Small residual invasive cancer multifocal 0.15 cm, grade 2, 0/3 lymph nodes all negative, ER 90%, PR 60%, HER-2 +3+ by IHC, Ki-67 20%, RCB class I, ympT1a, ypN0 3. Radiation therapy: Completed 07/15/2018 4.  Adjuvant antiestrogen therapy started  12/15/2018 -------------------------------------------------------------------------------------------------------------------------------- Current treatment: Anastrozole  1 mg daily stopped 09/21/2019 because of severe fatigue and severe joint pains, switched to letrozole  half tablet daily started 11/19/2019   Letrozole  toxicities: Muscle aches and pains have disappeared at half a tablet daily Fracture from a fall related to holding a [place].   Breast cancer surveillance: Mammogram 05/16/2023: No evidence of malignancy breast density category B Bone density 05/11/2021: T score -0.9: Normal  Breast exam 12/31/2023: Benign, right breast is firm and difficult to examine for abnormalities.  Recommended guardant reveal for MRD testing  Return to clinic in 1 year for follow-up  No orders of the defined types were placed in this encounter.  The patient has a good understanding of the overall plan. she agrees with it. she will call with any problems that may develop before the next visit here. Total time spent: 30 mins including face to face time and time spent for planning, charting and co-ordination of care   Viinay K Howell Groesbeck, MD 12/31/23

## 2023-12-31 NOTE — Assessment & Plan Note (Signed)
 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  Neoadjuvant chemotherapy with [facility] Perjeta  12/04/2017-03/26/2018 followed by Kadcyla  x 1 year completed 12/03/2018 2. 04/23/2018:Right lumpectomy: Small residual invasive cancer multifocal 0.15 cm, grade 2, 0/3 lymph nodes all negative, ER 90%, PR 60%, HER-2 +3+ by IHC, Ki-67 20%, RCB class I, ympT1a, ypN0 3. Radiation therapy: Completed 07/15/2018 4.  Adjuvant antiestrogen therapy started 12/15/2018 -------------------------------------------------------------------------------------------------------------------------------- Current treatment: Anastrozole  1 mg daily stopped 09/21/2019 because of severe fatigue and severe joint pains, switched to letrozole  half tablet daily started 11/19/2019   Letrozole  toxicities: Muscle aches and pains have disappeared at half a tablet daily Fracture from a fall related to holding a [place].   Breast cancer surveillance: Mammogram 05/16/2023: No evidence of malignancy breast density category B Bone density 05/11/2021: T score -0.9: Normal  Breast exam 12/31/2023: Benign Discussed Signatera testing.  Return to clinic in 1 year for follow-up

## 2024-01-02 NOTE — Telephone Encounter (Signed)
 Per md orders entered for Guardant Reveal and all supported documents faxed to [phone]. Faxed confirmation was received.

## 2024-01-07 DIAGNOSIS — F330 Major depressive disorder, recurrent, mild: Secondary | ICD-10-CM | POA: Diagnosis not present

## 2024-01-08 ENCOUNTER — Ambulatory Visit: Admitting: Family Medicine

## 2024-01-08 ENCOUNTER — Encounter: Payer: Self-pay | Admitting: Family Medicine

## 2024-01-08 ENCOUNTER — Other Ambulatory Visit: Payer: Self-pay | Admitting: Family Medicine

## 2024-01-08 VITALS — BP 113/72 | HR 70 | Temp 98.4°F | Ht 70.0 in | Wt 231.0 lb

## 2024-01-08 DIAGNOSIS — R7303 Prediabetes: Secondary | ICD-10-CM

## 2024-01-08 DIAGNOSIS — E66811 Obesity, class 1: Secondary | ICD-10-CM

## 2024-01-08 DIAGNOSIS — G4730 Sleep apnea, unspecified: Secondary | ICD-10-CM

## 2024-01-08 DIAGNOSIS — F32A Depression, unspecified: Secondary | ICD-10-CM

## 2024-01-08 DIAGNOSIS — F419 Anxiety disorder, unspecified: Secondary | ICD-10-CM

## 2024-01-08 DIAGNOSIS — K760 Fatty (change of) liver, not elsewhere classified: Secondary | ICD-10-CM

## 2024-01-08 DIAGNOSIS — Z6833 Body mass index (BMI) 33.0-33.9, adult: Secondary | ICD-10-CM

## 2024-01-08 MED ORDER — ZEPBOUND 2.5 MG/0.5ML ~~LOC~~ SOAJ: 2.5000 mg | SUBCUTANEOUS | 0 refills | Status: DC

## 2024-01-08 NOTE — Progress Notes (Signed)
 Office: [phone]  /  Fax: [phone]  WEIGHT SUMMARY AND BIOMETRICS  Starting Date: 12/25/23  Starting Weight: 232lb   Weight Lost Since Last Visit: 1lb   Vitals Temp: 98.4 F (36.9 C) BP: 113/72 Pulse Rate: 70 SpO2: 96 %   Body Composition  Body Fat %: 44.6 % Fat Mass (lbs): 103 lbs Muscle Mass (lbs): 121.6 lbs Total Body Water (lbs): 84.4 lbs Visceral Fat Rating : 12    HPI  Chief Complaint: OBESITY  Sandra [name] is here to discuss her progress with her obesity treatment plan. She is on the the Category 3 Plan and states she is following her eating plan approximately 45 % of the time. She states she is exercising 0 minutes 0 times per week.  Interval History:  Since last office visit she is down 1 lb She is struggling to comitt to the meal plan She is an emotional eater and picking convenience food many days She has work related stress with a long hx of emotional eating She still skips breakfast some days or gets fast food She then overeats at lunch and/ or makes bad food choices Her husband is on carnivore diet She is not liking her meal plan foods  Pharmacotherapy: none  PHYSICAL EXAM:  Blood pressure 113/72, pulse 70, temperature 98.4 F (36.9 C), height 5' 10 (1.778 m), weight 231 lb (104.8 kg), SpO2 96%. Body mass index is 33.15 kg/m.  General: She is overweight, cooperative, alert, well developed, and in no acute distress. PSYCH: Has normal mood, affect and thought process.   Lungs: Normal breathing effort, no conversational dyspnea.   ASSESSMENT AND PLAN  TREATMENT PLAN FOR OBESITY:  Recommended Dietary Goals  Sandra [name] is currently in the action stage of change. As such, her goal is to continue weight management plan. She has agreed to keeping a food journal and adhering to recommended goals of 1500 calories and 100 g of  protein and practicing portion control and making smarter food choices, such as increasing vegetables and decreasing simple  carbohydrates. - dining out guide given  Behavioral Intervention  We discussed the following Behavioral Modification Strategies today: increasing lean protein intake to established goals, increasing fiber rich foods, avoiding skipping meals, increasing water intake , work on meal planning and preparation, work on tracking and journaling calories using tracking application, keeping healthy foods at home, work on managing stress, creating time for self-care and relaxation, avoiding temptations and identifying enticing environmental cues, continue to practice mindfulness when eating, and planning for success.  Additional resources provided today: NA  Recommended Physical Activity Goals  Sandra [name] has been advised to work up to 150 minutes of moderate intensity aerobic activity a week and strengthening exercises 2-3 times per week for cardiovascular health, weight loss maintenance and preservation of muscle mass.   She has agreed to Think about enjoyable ways to increase daily physical activity and overcoming barriers to exercise and Increase physical activity in their day and reduce sedentary time (increase NEAT).  Pharmacotherapy changes for the treatment of obesity: begin Zepbound 2.5 mg weekly injection Patient denies a personal or family history of pancreatitis, medullary thyroid  carcinoma or multiple endocrine neoplasia type II. Recommend reviewing pen training video online. Reviewed MOA and common adverse SE  ASSOCIATED CONDITIONS ADDRESSED TODAY  Hepatic steatosis Reviewed most recent labs She has a known dx of hepatic steatosis.  Fib 4 score is <1.3--> does not meet criteria for elastography.  Continue active plan for weight reduction. Assessment & Plan:  Fibrosis  4 Score = 1.06 (Low risk)        Interpretation for patients with NAFLD          <1.30       -  F0-F1 (Low risk)          1.30-2.67 -  Indeterminate           >2.67      -  F3-F4 (High risk)     Validated for ages 8-65         Mild sleep apnea Continue oral appliance for mild OSA Continue to work on weight reduction, sleep hygiene and begin Zepbound weekly -     Zepbound; Inject 2.5 mg into the skin once a week.  Dispense: 2 mL; Refill: 0  Obesity, Class I, BMI 30-34.9 -     Zepbound; Inject 2.5 mg into the skin once a week.  Dispense: 2 mL; Refill: 0  BMI 33.0-33.9,adult  Prediabetes Lab Results  Component Value Date   HGBA1C 5.7 (H) 10/31/2023   Anticipate improvements with a low sugar diet focused on lean protein and fiber with meals Plan to ramp up walking time and aim for a 5% TBW loss, then recheck  Anxiety and depression Taking Trinellix 20 mg daily with Dr Curry Stress at work high, in counseling Has a long hx of emotional eating She agrees to a referral to Dr Sharron for CBT Work on goal setting, self care and stress reduction    She was informed of the importance of frequent follow up visits to maximize her success with intensive lifestyle modifications for her multiple health conditions.   ATTESTASTION STATEMENTS:  Reviewed by clinician on day of visit: allergies, medications, problem list, medical history, surgical history, family history, social history, and previous encounter notes pertinent to obesity diagnosis.   I have personally spent 32 minutes total time today in preparation, patient care, nutritional counseling and education,  and documentation for this visit, including the following: review of most recent clinical lab tests, prescribing medications/ refilling medications, reviewing medical assistant documentation, review and interpretation of bioimpedence results.     Sandra [name], D.O. DABFM, DABOM [facility] [address] [place], [place] [address] [phone]

## 2024-01-08 NOTE — Assessment & Plan Note (Signed)
 Fibrosis 4 Score = 1.06 (Low risk)        Interpretation for patients with NAFLD          <1.30       -  F0-F1 (Low risk)          1.30-2.67 -  Indeterminate           >2.67      -  F3-F4 (High risk)     Validated for ages 33-65

## 2024-01-08 NOTE — Patient Instructions (Signed)
 Work on tracking calorie intake using the [employer] app Aim for 1500 per day This should include 90-110 g of protein per day  Read labels on food and drink for sugar Avoid products with over 8 g of added sugar per serving  Let's start cognitive behavioral therapy with Dr Sharron  Work on stress reduction

## 2024-01-13 ENCOUNTER — Telehealth (INDEPENDENT_AMBULATORY_CARE_PROVIDER_SITE_OTHER): Payer: Self-pay

## 2024-01-13 NOTE — Telephone Encounter (Signed)
  Prior Authorization for Zepbound     Your request has been denied Denied. This health benefit plan does not cover the following services, supplies, drugs or charges: Any treatment or regimen, medical or surgical, for the purpose of reducing or controlling the weight of the member, or for the treatment of obesity, except for surgical treatment of morbid obesity, or as specifically covered by this health benefit plan.

## 2024-01-19 ENCOUNTER — Telehealth (INDEPENDENT_AMBULATORY_CARE_PROVIDER_SITE_OTHER): Admitting: Psychology

## 2024-01-19 DIAGNOSIS — F419 Anxiety disorder, unspecified: Secondary | ICD-10-CM | POA: Diagnosis not present

## 2024-01-19 DIAGNOSIS — F32A Depression, unspecified: Secondary | ICD-10-CM

## 2024-01-19 DIAGNOSIS — F5089 Other specified eating disorder: Secondary | ICD-10-CM

## 2024-01-19 NOTE — Progress Notes (Signed)
 Office: [phone]  /  Fax: [phone]    Date: January 19, 2024    Appointment Start Time: 3:07pm Duration: 44 minutes Provider: Wyatt Fire, Psy.D. Type of Session: Intake for Individual Therapy  Location of Patient: Parked in car (address obtained; private location) Location of Provider: [facility] Type of Contact: Telepsychological Visit via MyChart Video Visit  Informed Consent: Prior to proceeding with today's appointment, two pieces of identifying information were obtained. In addition, Sandra [name]'s physical location at the time of this appointment was obtained as well a phone number she could be reached at in the event of technical difficulties. Sandra [name] and this provider participated in today's telepsychological service.   The provider's role was explained to [facility]. The provider reviewed and discussed issues of confidentiality, privacy, and limits therein (e.g., reporting obligations). In addition to verbal informed consent, written informed consent for psychological services was obtained prior to the initial appointment. Since the clinic is not a 24/7 crisis center, mental health emergency resources were shared and this  provider explained MyChart, e-mail, voicemail, and/or other messaging systems should be utilized only for non-emergency reasons. This provider also explained that information obtained during appointments will be placed in [facility] medical record and relevant information will be shared with other providers at [facility] at any locations for coordination of care. Sandra [name] agreed information may be shared with other [facility] providers as needed for coordination of care and by signing the service agreement document, she provided written consent for coordination of care. Prior to initiating telepsychological services, Sandra [name] completed an informed consent document, which included the development of a safety plan (i.e., an  emergency contact and emergency resources) in the event of an emergency/crisis. Ryan verbally acknowledged understanding she is ultimately responsible for understanding her insurance benefits for telepsychological and in-person services. This provider also reviewed confidentiality, as it relates to telepsychological services. Sandra [name]  acknowledged understanding that appointments cannot be recorded without both party consent and she is aware she is responsible for securing confidentiality on her end of the session. Sandra [name] verbally consented to proceed.  Chief Complaint/HPI: Sandra [name] was referred by Dr. Darice Haddock on 01/08/2024 due to anxiety and depression. Per the note for the OV: Taking Trinellix 20 mg daily with Dr Curry Stress at work high, in counseling Has a long hx of emotional eating She agrees to a referral to Dr Fire for CBT Work on goal setting, self care and stress reduction  During today's appointment, Sandra [name] was verbally administered a questionnaire assessing various behaviors related to emotional eating behaviors. Sandra [name] endorsed the following: overeat when you are celebrating, experience food cravings on a regular basis, eat certain foods when you are anxious, stressed, depressed, or your feelings are hurt, use food to help you cope with emotional situations, find food is comforting to you, overeat frequently when you are bored or lonely, not worry about what you eat when you are in a good mood, eat to help you stay awake, and eat as a reward. She shared she craves sugar. Sandra [name] believes the onset of emotional eating behaviors was likely in the last 10 years, noting she is unsure of any significant events that occurred around that time. She described the current frequency of emotional eating behaviors as daily. In addition, Sandra [name] denied a history of binge eating behaviors. Sandra [name] denied a history of significantly restricting food intake, purging and engagement in other compensatory strategies for  weight loss, and has never been diagnosed  with an eating disorder. She also denied a history of treatment for emotional eating behaviors. Furthermore, Sandra [name] indicated she had easy access to food at her old office, noting it was all junk food. She stated she no longer has as easy access to the snacks, but she can access them if she wants to. Sandra [name] further shared she likes to eat foods that are convenient (e.g., drive thru). Sandra [name] also described limited support from her husband as it relates to eating habits.   Mental Status Examination:  Appearance: neat Behavior: appropriate to circumstances Mood: sad Affect: mood congruent Speech: WNL Eye Contact: appropriate Psychomotor Activity: WNL Gait: unable to assess  Thought Process: linear, logical, and goal directed and denies suicidal, homicidal, and self-harm ideation, plan and intent  Thought Content/Perception: no hallucinations, delusions, bizarre thinking or behavior endorsed or observed Orientation: AAOx4 Memory/Concentration: intact Insight/Judgment: fair  Family & Psychosocial History: Sandra [name] reported she is married and she has two children (ages 71 and 67). 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 Additionally, Sandra [name] shared her highest level of education obtained is post bachelors, but not a master's. Currently, Sandra [name]'s social support system consists of her co-workers, friends, people from church and husband. Moreover, Sandra [name] stated she resides with her husband and children.   Medical History:  Past Medical History:  Diagnosis Date   Anxiety    Breast cancer (HCC) 11/2017   rt.Br.   Depression    Depression    Endometriosis, diagnosis via laparoscopy    Family history of breast cancer    Family history of breast cancer    Family history of colon cancer    Family history of prostate cancer     Hyperlipidemia 2017   Lichen sclerosus, vulva    Low serum vitamin D  2017   Personal history of chemotherapy    Personal history of radiation therapy    SOB (shortness of breath)    Vitamin D  deficiency    Past Surgical History:  Procedure Laterality Date   APPENDECTOMY  2005   BREAST BIOPSY Right 11/11/2017   BREAST BIOPSY Right 12/12/2017   x2   BREAST LUMPECTOMY Right 04/23/2018   BREAST LUMPECTOMY WITH RADIOACTIVE SEED AND SENTINEL LYMPH NODE BIOPSY Right 04/23/2018   Procedure: RIGHT BREAST WITH RADIOACTIVE SEED LUMPECTOMY x3 AND SENTINEL LYMPH NODE MAPPING;  Surgeon: Vanderbilt Ned, MD;  Location: [facility];  Service: General;  Laterality: Right;   CESAREAN SECTION  2002   MANDIBLE RECONSTRUCTION  1985   wires inside jaw according to pt   PELVIC LAPAROSCOPY  2004   PORT-A-CATH REMOVAL Right 01/27/2019   Procedure: PORT REMOVAL;  Surgeon: Vanderbilt Ned, MD;  Location: [facility];  Service: General;  Laterality: Right;   PORTACATH PLACEMENT Right 12/03/2017   Procedure: INSERTION PORT-A-CATH;  Surgeon: Vanderbilt Ned, MD;  Location:  SURGERY CENTER;  Service: General;  Laterality: Right;   SHIN SURGERY  1998   Current Outpatient Medications on File Prior to Visit  Medication Sig Dispense Refill   betamethasone  valerate ointment (VALISONE ) 0.1 % APPLY TWICE A DAY FOR 1-2 WEEKS AS NEEDED FOR FLARE OF LICHEN SCLEROSIS. 15 g 1   clobetasol  ointment (TEMOVATE ) 0.05 % Apply twice a day to affected area for 2 weeks then twice weekly 60 g 5   clotrimazole  (LOTRIMIN ) 1 % cream APPLY TO AFFECTED AREA TWICE A DAY 45  g 0   Fluticasone-Umeclidin-Vilant (TRELEGY ELLIPTA ) 100-62.5-25 MCG/ACT AEPB Inhale 1 puff into the lungs daily. 28 each 11   Fluticasone-Umeclidin-Vilant (TRELEGY ELLIPTA ) 100-62.5-25 MCG/ACT AEPB Inhale 1 puff into the lungs daily in the afternoon.     letrozole  (FEMARA ) 2.5 MG tablet TAKE HALF A TABLET BY MOUTH DAILY. 45 tablet 3   OVER THE COUNTER  MEDICATION Take 3 capsules by mouth daily. Probiome complex     tirzepatide (ZEPBOUND) 2.5 MG/0.5ML Pen Inject 2.5 mg into the skin once a week. 2 mL 0   vortioxetine  HBr (TRINTELLIX ) 20 MG TABS tablet Take one tab daily 90 tablet 0   No current facility-administered medications on file prior to visit.   Past Medical History:  Diagnosis Date   Anxiety    Breast cancer (HCC) 11/2017   rt.Br.   Depression    Depression    Endometriosis, diagnosis via laparoscopy    Family history of breast cancer    Family history of breast cancer    Family history of colon cancer    Family history of prostate cancer    Hyperlipidemia 2017   Lichen sclerosus, vulva    Low serum vitamin D  2017   Personal history of chemotherapy    Personal history of radiation therapy    SOB (shortness of breath)    Vitamin D  deficiency    Past Surgical History:  Procedure Laterality Date   APPENDECTOMY  2005   BREAST BIOPSY Right 11/11/2017   BREAST BIOPSY Right 12/12/2017   x2   BREAST LUMPECTOMY Right 04/23/2018   BREAST LUMPECTOMY WITH RADIOACTIVE SEED AND SENTINEL LYMPH NODE BIOPSY Right 04/23/2018   Procedure: RIGHT BREAST WITH RADIOACTIVE SEED LUMPECTOMY x3 AND SENTINEL LYMPH NODE MAPPING;  Surgeon: Vanderbilt Ned, MD;  Location: [facility];  Service: General;  Laterality: Right;   CESAREAN SECTION  2002   MANDIBLE RECONSTRUCTION  1985   wires inside jaw according to pt   PELVIC LAPAROSCOPY  2004   PORT-A-CATH REMOVAL Right 01/27/2019   Procedure: PORT REMOVAL;  Surgeon: Vanderbilt Ned, MD;  Location: [facility];  Service: General;  Laterality: Right;   PORTACATH PLACEMENT Right 12/03/2017   Procedure: INSERTION PORT-A-CATH;  Surgeon: Vanderbilt Ned, MD;  Location: [facility];  Service: General;  Laterality: Right;   SHIN SURGERY  1998   Current Outpatient Medications on File Prior to Visit  Medication Sig Dispense Refill   betamethasone  valerate ointment (VALISONE ) 0.1 %  APPLY TWICE A DAY FOR 1-2 WEEKS AS NEEDED FOR FLARE OF LICHEN SCLEROSIS. 15 g 1   clobetasol  ointment (TEMOVATE ) 0.05 % Apply twice a day to affected area for 2 weeks then twice weekly 60 g 5   clotrimazole  (LOTRIMIN ) 1 % cream APPLY TO AFFECTED AREA TWICE A DAY 45 g 0   Fluticasone-Umeclidin-Vilant (TRELEGY ELLIPTA ) 100-62.5-25 MCG/ACT AEPB Inhale 1 puff into the lungs daily. 28 each 11   Fluticasone-Umeclidin-Vilant (TRELEGY ELLIPTA ) 100-62.5-25 MCG/ACT AEPB Inhale 1 puff into the lungs daily in the afternoon.     letrozole  (FEMARA ) 2.5 MG tablet TAKE HALF A TABLET BY MOUTH DAILY. 45 tablet 3   OVER THE COUNTER MEDICATION Take 3 capsules by mouth daily. Probiome complex     tirzepatide (ZEPBOUND) 2.5 MG/0.5ML Pen Inject 2.5 mg into the skin once a week. 2 mL 0   vortioxetine  HBr (TRINTELLIX ) 20 MG TABS tablet Take one tab daily 90 tablet 0   No current facility-administered medications on file prior to visit.  Mental Health History: Sebastian reported she currently meets with Sandra [name] ([facility]) for therapeutic services to try to balance work and life. She explained work is really stressful right now. She further shared discussing family dynamics as her husband is reportedly an introvert.  She shared her psychiatric provider, Dr. Curry, prescribes Trintellix . She denied a family history of mental health/substance abuse related concerns. Furthermore, Sandra [name] reported there is no history of trauma including psychological, physical , and sexual abuse, as well as neglect.   Sandra [name] described her typical mood lately as very tired. Sandra [name] denied current illicit/recreational substance use. Furthermore, Sandra [name] indicated she is not experiencing the following: hallucinations and delusions, paranoia, symptoms of mania , social withdrawal, crying spells, panic attacks, memory concerns, attention and concentration issues, and obsessions and compulsions. She also denied history of and current suicidal  ideation, plan, and intent; history of and current homicidal ideation, plan, and intent; and history of and current engagement in self-harm.  Legal History: Katheline reported there is no history of legal involvement.   Structured Assessments Results: The Patient Health Questionnaire-9 (PHQ-9) is a self-report measure that assesses symptoms and severity of depression over the course of the last two weeks. Sandra [name] obtained a score of 4 suggesting minimal depression. Sandra [name] finds the endorsed symptoms to be somewhat difficult. [0= Not at all; 1= Several days; 2= More than half the days; 3= Nearly every day] Little interest or pleasure in doing things 1  Feeling down, depressed, or hopeless 1  Trouble falling or staying asleep, or sleeping too much 0  Feeling tired or having little energy 1  Poor appetite or overeating 0  Feeling bad about yourself --- or that you are a failure or have let yourself or your family down 1  Trouble concentrating on things, such as reading the newspaper or watching television 0  Moving or speaking so slowly that other people could have noticed? Or the opposite --- being so fidgety or restless that you have been moving around a lot more than usual 0  Thoughts that you would be better off dead or hurting yourself in some way 0  PHQ-9 Score 4    The Generalized Anxiety Disorder-7 (GAD-7) is a brief self-report measure that assesses symptoms of anxiety over the course of the last two weeks. Sandra [name] obtained a score of 6 suggesting mild anxiety. Lasheena finds the endorsed symptoms to be somewhat difficult. [0= Not at all; 1= Several days; 2= Over half the days; 3= Nearly every day] Feeling nervous, anxious, on edge 1  Not being able to stop or control worrying 1  Worrying too much about different things 0  Trouble relaxing 0  Being so restless that it's hard to sit still 0  Becoming easily annoyed or irritable 1  Feeling afraid as if something awful might happen 3  GAD-7 Score 6    Interventions:  Conducted a chart review Focused on rapport building Verbally administered PHQ-9 and GAD-7 for symptom monitoring Verbally administered Food & Mood questionnaire to assess various behaviors related to emotional eating Provided emphatic reflections and validation Psychoeducation provided regarding physical versus emotional hunger   Diagnostic Impressions & Provisional DSM-5 Diagnosis(es): Adaleah endorsed a history of engagement in emotional eating behaviors and noted the onset in the last 10 years. She described the current frequency as daily. Echo denied engagement in any other disordered eating behaviors. Based on the aforementioned, the following diagnosis was assigned: F50.89 Other Specified Feeding or Eating Disorder, Emotional Eating Behaviors. Moreover,  she endorsed experiencing anxiety and depression-related symptomatology during today's appointment and is attending therapeutic and psychiatric services. Given the limited scope of this appointment and this provider's role with the clinic, the following diagnoses were assigned: F41.9 Unspecified Anxiety Disorder and  F32.A Unspecified Depressive Disorder.  Plan: Velna appears able and willing to participate as evidenced by engagement in reciprocal conversation and asking questions as needed for clarification. The next appointment is scheduled for 02/09/2024 at 4pm, which will be via MyChart Video Visit. The following treatment goal was established: increase coping skills. This provider will regularly review the treatment plan and medical chart to keep informed of status changes. Aniston expressed understanding and agreement with the initial treatment plan of care.   Hailyn will be sent a handout via e-mail to utilize between now and the next appointment to increase awareness of hunger patterns and subsequent eating. Kailynn provided verbal consent during today's appointment for this provider to send the handout via e-mail.   Nhi will  continue with her primary therapist and psychiatric provider.    Wyatt Fire, PsyD

## 2024-01-28 ENCOUNTER — Telehealth (HOSPITAL_COMMUNITY): Admitting: Psychiatry

## 2024-01-28 ENCOUNTER — Telehealth: Payer: Self-pay

## 2024-01-28 ENCOUNTER — Encounter (HOSPITAL_COMMUNITY): Payer: Self-pay | Admitting: Psychiatry

## 2024-01-28 VITALS — Wt 231.0 lb

## 2024-01-28 DIAGNOSIS — F330 Major depressive disorder, recurrent, mild: Secondary | ICD-10-CM | POA: Diagnosis not present

## 2024-01-28 DIAGNOSIS — F419 Anxiety disorder, unspecified: Secondary | ICD-10-CM

## 2024-01-28 MED ORDER — VORTIOXETINE HBR 20 MG PO TABS: ORAL_TABLET | ORAL | 0 refills | Status: DC

## 2024-01-28 NOTE — Telephone Encounter (Signed)
 Called pt per MD to advise [facility] testing was negative/not detected. Pt verbalized understanding of results and knows [facility] will be in touch to schedule 6 mo repeat lab.

## 2024-01-28 NOTE — Progress Notes (Signed)
 [facility] MD Virtual Progress Note   Patient Location: In Car Provider Location: Home Office  I connect with patient by video and verified that I am speaking with correct person by using two identifiers. I discussed the limitations of evaluation and management by telemedicine and the availability of in person appointments. I also discussed with the patient that there may be a patient responsible charge related to this service. The patient expressed understanding and agreed to proceed.  Sandra [name] [id number] y.o.  01/28/2024 9:07 AM  History of Present Illness:  Patient is evaluated by video session.  She reported anxiety and lately crying spells.  She is very stressed about her job.  She told recently had a 81-month evaluation and she was given a very low score and she is not happy about it.  Patient told in past few months a lot of people left the company due to disagreement with the leadership.  Even though company is trying to hire new people but environment is very hostile.  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  She worried about her job and her future with the company.  She admitted sometimes feeling hopeless but denies any suicidal thoughts or homicidal thoughts.  She is not sleeping well.  She is taking Trintellix  every day.  She is also seeing her therapist Rosaline.  Now she is considering to look for other job.  She admitted ruminative thoughts and severe anxiety.  Denies any hallucination, paranoia or any homicidal thoughts.  Her appetite is okay.  Her energy level is low.  On the last visit she was thinking about hormonal replacement therapy to help her libido but has not started yet.  Given the history of breast cancer she need to think about hormonal replacement therapy.  She is not sure if the Trintellix  causing lack of libido.  Patient told that she is taking few days off as father-in-law is moving from [place]  to [place]   and she is hoping to visit her father who lives in [place]  to check on him.  Patient is going with her husband who is very supportive.  Her appetite is fair.  Weight is unchanged from the past.  Past Psychiatric History: [facility]  Tried Paxil , Lexapro, ativan  and trazadone but don't remember.  Lamictal did not help and Wellbutrin caused depression.  We tried Effexor  and Prozac  but did not help.  No h/o psychosis, suicidal attempt or Inpatient.     Past Medical History:  Diagnosis Date   Anxiety    Breast cancer (HCC) 11/2017   rt.Br.   Depression    Depression    Endometriosis, diagnosis via laparoscopy    Family history of breast cancer    Family history of breast cancer    Family history of colon cancer    Family history of prostate cancer    Hyperlipidemia 2017   Lichen sclerosus, vulva    Low serum vitamin D  2017   Personal history of chemotherapy    Personal history of radiation therapy    SOB (shortness of breath)    Vitamin D  deficiency     Outpatient Encounter Medications as of 01/28/2024  Medication Sig   betamethasone  valerate ointment (VALISONE ) 0.1 % APPLY TWICE A DAY FOR 1-2 WEEKS AS NEEDED FOR FLARE OF LICHEN SCLEROSIS.   clobetasol  ointment (TEMOVATE ) 0.05 % Apply twice a day to affected area for 2 weeks then twice weekly   clotrimazole  (  LOTRIMIN ) 1 % cream APPLY TO AFFECTED AREA TWICE A DAY   Fluticasone-Umeclidin-Vilant (TRELEGY ELLIPTA ) 100-62.5-25 MCG/ACT AEPB Inhale 1 puff into the lungs daily.   Fluticasone-Umeclidin-Vilant (TRELEGY ELLIPTA ) 100-62.5-25 MCG/ACT AEPB Inhale 1 puff into the lungs daily in the afternoon.   letrozole  (FEMARA ) 2.5 MG tablet TAKE HALF A TABLET BY MOUTH DAILY.   OVER THE COUNTER MEDICATION Take 3 capsules by mouth daily. Probiome complex   tirzepatide (ZEPBOUND) 2.5 MG/0.5ML Pen Inject 2.5 mg into the skin once a week.   vortioxetine  HBr (TRINTELLIX ) 20 MG TABS tablet Take one tab daily   No  facility-administered encounter medications on file as of 01/28/2024.    Recent Results (from the past 2160 hours)  CBC     Status: None   Collection Time: 10/31/23 10:52 AM  Result Value Ref Range   WBC 5.1 3.4 - 10.8 x10E3/uL   RBC 4.37 3.77 - 5.28 x10E6/uL   Hemoglobin 13.3 11.1 - 15.9 g/dL   Hematocrit 59.6 65.9 - 46.6 %   MCV 92 79 - 97 fL   MCH 30.4 26.6 - 33.0 pg   MCHC 33.0 31.5 - 35.7 g/dL   RDW 86.3 88.2 - 84.5 %   Platelets 267 150 - 450 x10E3/uL  Comp Met (CMET)     Status: None   Collection Time: 10/31/23 10:52 AM  Result Value Ref Range   Glucose 89 70 - 99 mg/dL   BUN 12 6 - 24 mg/dL   Creatinine, Ser 9.09 0.57 - 1.00 mg/dL   eGFR 75 >40 fO/fpw/8.26   BUN/Creatinine Ratio 13 9 - 23   Sodium 140 134 - 144 mmol/L   Potassium 4.1 3.5 - 5.2 mmol/L   Chloride 103 96 - 106 mmol/L   CO2 22 20 - 29 mmol/L   Calcium 9.5 8.7 - 10.2 mg/dL   Total Protein 7.2 6.0 - 8.5 g/dL   Albumin 4.4 3.8 - 4.9 g/dL   Globulin, Total 2.8 1.5 - 4.5 g/dL   Bilirubin Total 0.4 0.0 - 1.2 mg/dL   Alkaline Phosphatase 66 44 - 121 IU/L   AST 22 0 - 40 IU/L   ALT 25 0 - 32 IU/L  TSH     Status: None   Collection Time: 10/31/23 10:52 AM  Result Value Ref Range   TSH 1.530 0.450 - 4.500 uIU/mL  Hemoglobin A1c     Status: Abnormal   Collection Time: 10/31/23 10:52 AM  Result Value Ref Range   Hgb A1c MFr Bld 5.7 (H) 4.8 - 5.6 %    Comment:          Prediabetes: 5.7 - 6.4          Diabetes: >6.4          Glycemic control for adults with diabetes: <7.0    Est. average glucose Bld gHb Est-mCnc 117 mg/dL  Lipid panel     Status: Abnormal   Collection Time: 10/31/23 10:52 AM  Result Value Ref Range   Cholesterol, Total 248 (H) 100 - 199 mg/dL   Triglycerides 859 0 - 149 mg/dL   HDL 54 >60 mg/dL   VLDL Cholesterol Cal 25 5 - 40 mg/dL   LDL Chol Calc (NIH) 830 (H) 0 - 99 mg/dL   Chol/HDL Ratio 4.6 (H) 0.0 - 4.4 ratio    Comment:  T. Chol/HDL Ratio                                              Men  Women                               1/2 Avg.Risk  3.4    3.3                                   Avg.Risk  5.0    4.4                                2X Avg.Risk  9.6    7.1                                3X Avg.Risk 23.4   11.0   VITAMIN D  25 Hydroxy (Vit-D Deficiency, Fractures)     Status: None   Collection Time: 10/31/23 10:52 AM  Result Value Ref Range   Vit D, 25-Hydroxy 43.6 30.0 - 100.0 ng/mL    Comment: Vitamin D  deficiency has been defined by the Institute of Medicine and an Endocrine Society practice guideline as a level of serum 25-OH vitamin D  less than 20 ng/mL (1,2). The Endocrine Society went on to further define vitamin D  insufficiency as a level between 21 and 29 ng/mL (2). 1. IOM (Institute of Medicine). 2010. Dietary reference    intakes for calcium and D. [place]: The    [employer]. 2. Holick MF, Binkley [place], Bischoff-Ferrari HA, et al.    Evaluation, treatment, and prevention of vitamin D     deficiency: an Endocrine Society clinical practice    guideline. JCEM. 2011 Jul; 96(7):1911-30.   B12     Status: None   Collection Time: 10/31/23 10:52 AM  Result Value Ref Range   Vitamin B-12 600 232 - 1,245 pg/mL  Cytology - PAP( [place])     Status: None   Collection Time: 11/03/23  9:47 AM  Result Value Ref Range   High risk HPV Negative    Adequacy      Satisfactory for evaluation. The presence or absence of an   Adequacy      endocervical/transformation zone component cannot be determined because   Adequacy of atrophy.    Diagnosis      - Negative for intraepithelial lesion or malignancy (NILM)   Comment Normal Reference Range HPV - Negative   Pulmonary function test     Status: None   Collection Time: 11/17/23  1:26 PM  Result Value Ref Range   FVC-Pre 3.85 L   FVC-%Pred-Pre 92 %   FVC-Post 3.93 L   FVC-%Pred-Post 94 %   FVC-%Change-Post 2 %   FEV1-Pre 3.28 L   FEV1-%Pred-Pre 101 %    FEV1-Post 3.23 L   FEV1-%Pred-Post 99 %   FEV1-%Change-Post -1 %   FEV6-Pre 3.85 L   FEV6-%Pred-Pre 95 %   FEV6-Post 3.92 L   FEV6-%Pred-Post 97 %   FEV6-%Change-Post 1 %   Pre FEV1/FVC ratio 85 %   FEV1FVC-%Pred-Pre 108 %   Post FEV1/FVC ratio  82 %   FEV1FVC-%Change-Post -3 %   Pre FEV6/FVC Ratio 100 %   FEV6FVC-%Pred-Pre 103 %   Post FEV6/FVC ratio 100 %   FEV6FVC-%Pred-Post 102 %   FEV6FVC-%Change-Post 0 %   FEF 25-75 Pre 3.45 L/sec   FEF2575-%Pred-Pre 121 %   FEF 25-75 Post 3.49 L/sec   FEF2575-%Pred-Post 122 %   FEF2575-%Change-Post 1 %   RV 2.64 L   RV % pred 118 %   TLC 6.59 L   TLC % pred 110 %   DLCO unc 25.87 ml/min/mmHg   DLCO unc % pred 104 %   DL/VA 5.51 ml/min/mmHg/L   DL/VA % pred 889 %  Nitric oxide      Status: None   Collection Time: 11/17/23  3:04 PM  Result Value Ref Range   Nitric Oxide  20   Allergen Panel (27) + IGE     Status: Abnormal   Collection Time: 11/17/23  3:52 PM  Result Value Ref Range   Class Description Allergens Comment     Comment: (NOTE)    Levels of Specific IgE       Class  Description of Class    ---------------------------  -----  --------------------                   < 0.10         0         Negative           0.10 -    0.31         0/I       Equivocal/Low           0.32 -    0.55         I         Low           0.56 -    1.40         II        Moderate           1.41 -    3.90         III       High           3.91 -   19.00         IV        Very High          19.01 -  100.00         V         Very High                  >100.00         VI        Very High    IgE (Immunoglobulin E), Serum 61 6 - 495 IU/mL   D Pteronyssinus IgE 0.35 (A) Class I kU/L   D Farinae IgE 0.39 (A) Class I kU/L   Cat Dander IgE 0.13 (A) Class 0/I kU/L   Dog Dander IgE 0.27 (A) Class 0/I kU/L   Bermuda Grass IgE <0.10 Class 0 kU/L   Timothy Grass IgE <0.10 Class 0 kU/L   [place]  Bluegrass IgE <0.10 Class 0 kU/L   Johnson Grass IgE <0.10 Class  0 kU/L   Bahia Grass IgE <0.10 Class 0 kU/L   Cockroach, American IgE 0.79 (A) Class II kU/L    Comment: (NOTE) This test was developed and its performance characteristics determined by [facility].  It has not been cleared or approved by the U.S. Food and Drug Administration. The FDA has determined that such clearance or approval is not necessary. This test is used for clinical purposes.  It should not be regarded as investigational or for research.    Penicillium Chrysogen IgE <0.10 Class 0 kU/L   Cladosporium Herbarum IgE <0.10 Class 0 kU/L   Aspergillus Fumigatus IgE <0.10 Class 0 kU/L   Mucor Racemosus IgE <0.10 Class 0 kU/L   Alternaria Alternata IgE <0.10 Class 0 kU/L   Setomelanomma Rostrat <0.10 Class 0 kU/L   Oak, White IgE <0.10 Class 0 kU/L   Elm, American IgE <0.10 Class 0 kU/L   Maple/Box Elder IgE <0.10 Class 0 kU/L   Common Silver Valrie IgE <0.10 Class 0 kU/L   Hickory, White IgE <0.10 Class 0 kU/L    Comment: (NOTE) This test was developed and its performance characteristics determined by [facility].  It has not been cleared or approved by the U.S. Food and Drug Administration. The FDA has determined that such clearance or approval is not necessary. This test is used for clinical purposes.  It should not be regarded as investigational or for research.    White Mulberry IgE <0.10 Class 0 kU/L   Cedar, [place] IgE <0.10 Class 0 kU/L   Ragweed, Short IgE <0.10 Class 0 kU/L   Plantain, English IgE <0.10 Class 0 kU/L   Cocklebur IgE <0.10 Class 0 kU/L   Pigweed, Rough IgE <0.10 Class 0 kU/L    Comment: (NOTE) Performed At: [facility] [place] [address] [place], [place] [id number] Jennette Shorter MD Ey:[id number]   CBC with Differential/Platelet     Status: None   Collection Time: 11/17/23  3:52 PM  Result Value Ref Range   WBC 6.4 4.0 - 10.5 K/uL   RBC 4.22 3.87 - 5.11 MIL/uL   Hemoglobin 12.8 12.0 - 15.0 g/dL   HCT 61.2 63.9 - 53.9 %   MCV 91.7 80.0 - 100.0  fL   MCH 30.3 26.0 - 34.0 pg   MCHC 33.1 30.0 - 36.0 g/dL   RDW 86.1 88.4 - 84.4 %   Platelets 236 150 - 400 K/uL   nRBC 0.0 0.0 - 0.2 %   Neutrophils Relative % 60 %   Neutro Abs 3.9 1.7 - 7.7 K/uL   Lymphocytes Relative 27 %   Lymphs Abs 1.8 0.7 - 4.0 K/uL   Monocytes Relative 10 %   Monocytes Absolute 0.7 0.1 - 1.0 K/uL   Eosinophils Relative 2 %   Eosinophils Absolute 0.1 0.0 - 0.5 K/uL   Basophils Relative 0 %   Basophils Absolute 0.0 0.0 - 0.1 K/uL   Immature Granulocytes 1 %   Abs Immature Granulocytes 0.03 0.00 - 0.07 K/uL    Comment: Performed at [facility], [address] [place], [place] [address]  Insulin , random     Status: None   Collection Time: 12/25/23  9:37 AM  Result Value Ref Range   INSULIN  14.2 2.6 - 24.9 uIU/mL  Folate     Status: None   Collection Time: 12/25/23  9:37 AM  Result Value Ref Range   Folate 18.1 >3.0 ng/mL    Comment: A serum folate concentration of less than 3.1 ng/mL is considered to represent clinical deficiency.      Psychiatric Specialty Exam: Physical Exam  Review of Systems  Weight 231 lb (104.8 kg).There is no height or weight on file to calculate BMI.  General  Appearance: Casual  Eye Contact:  Fair  Speech:  Slow  Volume:  Decreased  Mood:  Anxious, Depressed, Dysphoric, and Hopeless  Affect:  Constricted and Depressed  Thought Process:  Descriptions of Associations: Intact  Orientation:  Full (Time, Place, and Person)  Thought Content:  Rumination  Suicidal Thoughts:  No  Homicidal Thoughts:  No  Memory:  Immediate;   Good Recent;   Good Remote;   Fair  Judgement:  Intact  Insight:  Present  Psychomotor Activity:  Decreased  Concentration:  Concentration: Good and Attention Span: Good  Recall:  Good  Fund of Knowledge:  Good  Language:  Good  Akathisia:  No  Handed:  Right  AIMS (if indicated):     Assets:  Communication Skills Desire for Improvement Housing Social  Support Talents/Skills Transportation  ADL's:  Intact  Cognition:  WNL  Sleep: Fair       01/29/2023    9:08 AM 11/12/2022    2:05 PM 02/19/2021   12:28 PM 09/27/2020    8:41 AM 09/19/2020    9:37 AM  Depression screen PHQ 2/9  Decreased Interest 1 1 2 1 1   Down, Depressed, Hopeless 0 1 2 1 1   PHQ - 2 Score 1 2 4 2 2   Altered sleeping 1 1 0 1 2  Tired, decreased energy 2 2 3 1 1   Change in appetite 3 1 2 1 2   Feeling bad or failure about yourself  1 1 0 0 2  Trouble concentrating 0 0 0 1 1  Moving slowly or fidgety/restless 0 0 1 1 0  Suicidal thoughts 0 0 0 0 0  PHQ-9 Score 8 7 10 7 10   Difficult doing work/chores Not difficult at all Not difficult at all       Assessment/Plan: MDD (major depressive disorder), recurrent episode, mild - Plan: vortioxetine  HBr (TRINTELLIX ) 20 MG TABS tablet  Anxiety - Plan: vortioxetine  HBr (TRINTELLIX ) 20 MG TABS tablet  Patient is 58 year old female with history of breast cancer, asthma, major depressive disorder and anxiety.  Discussed psychosocial stressors especially her job and 6 months evaluation from her supervisor.  She is not happy.  Job situation is making her very anxious.  She is not sleeping well.  She is taking ZzzQuil which helps but wondering if she can try something different.  I recommend she should try melatonin to help her sleep.  In the past she had tried but it made her dizzy.  I recommend she can try from 1 mg up to 10 mg and most of the time 3 to 5 mg help her sleep.  We talked about side effects of Trintellix  as patient concerned of lack of libido.  I reviewed past medication, Wellbutrin made her more depressed and she tried Effexor , Prozac , Lexapro, Paxil  and Lamictal and mostly either they did not work or have side effects.  After discussion she agreed to keep the Trintellix  as it has less chance of sexual side effects.  However if she is still feel medicine needs to be change we can try either Viibryd or Cymbalta .  She is  going to talk to her PCP and her cancer doctor for further discussion about hormone replacement therapy.  Encouraged to continue therapy with Rosaline.  Discussed safety concerns at any time having active suicidal thoughts or homicidal thoughts and she need to call 911 or go to local emergency room.  Will follow-up in 2 months or sooner if she need an earlier appointment.  Follow Up Instructions:     I discussed the assessment and treatment plan with the patient. The patient was provided an opportunity to ask questions and all were answered. The patient agreed with the plan and demonstrated an understanding of the instructions.   The patient was advised to call back or seek an in-person evaluation if the symptoms worsen or if the condition fails to improve as anticipated.    Collaboration of Care: Other provider involved in patient's care AEB notes are available in epic to review  Patient/Guardian was advised Release of Information must be obtained prior to any record release in order to collaborate their care with an outside provider. Patient/Guardian was advised if they have not already done so to contact the registration department to sign all necessary forms in order for us  to release information regarding their care.   Consent: Patient/Guardian gives verbal consent for treatment and assignment of benefits for services provided during this visit. Patient/Guardian expressed understanding and agreed to proceed.     Total encounter time 28 minutes which includes face-to-face time, chart reviewed, care coordination, order entry and documentation during this encounter.   Note: This document was prepared by [employer] voice dictation technology and any errors that results from this process are unintentional.    Leni ONEIDA Client, MD 01/28/2024

## 2024-01-29 ENCOUNTER — Encounter: Payer: Self-pay | Admitting: Hematology and Oncology

## 2024-02-09 ENCOUNTER — Encounter (INDEPENDENT_AMBULATORY_CARE_PROVIDER_SITE_OTHER): Payer: Self-pay

## 2024-02-09 ENCOUNTER — Encounter (INDEPENDENT_AMBULATORY_CARE_PROVIDER_SITE_OTHER): Payer: Self-pay | Admitting: Psychology

## 2024-02-09 ENCOUNTER — Telehealth (INDEPENDENT_AMBULATORY_CARE_PROVIDER_SITE_OTHER): Payer: Self-pay | Admitting: Psychology

## 2024-02-09 NOTE — Telephone Encounter (Signed)
  Office: [phone]  /  Fax: [phone]  Date of Encounter: February 09, 2024  Time of Encounter: 4:01pm Duration of Encounter: ~2.5 minute(s) Provider: Wyatt Fire, PsyD  CONTENT: Sandra [name] presented for today's appointment via MyChart Video Visit; however, she disclosed her voice is going in and out. Given the abovementioned, she was receptive to rescheduling today's appointment. No evidence or endorsement of safety concerns. All questions/concerns addressed.   PLAN: Sandra [name] is scheduled for an appointment on 02/17/2024 at 3:30pm via MyChart Video Visit.

## 2024-02-09 NOTE — Progress Notes (Signed)
 Entered in error

## 2024-02-11 ENCOUNTER — Ambulatory Visit: Admitting: Family Medicine

## 2024-02-11 DIAGNOSIS — F330 Major depressive disorder, recurrent, mild: Secondary | ICD-10-CM | POA: Diagnosis not present

## 2024-02-12 ENCOUNTER — Encounter: Payer: Self-pay | Admitting: Family Medicine

## 2024-02-12 ENCOUNTER — Ambulatory Visit: Admitting: Family Medicine

## 2024-02-12 VITALS — BP 124/84 | HR 84 | Temp 97.9°F | Ht 70.0 in | Wt 227.0 lb

## 2024-02-12 DIAGNOSIS — K760 Fatty (change of) liver, not elsewhere classified: Secondary | ICD-10-CM

## 2024-02-12 DIAGNOSIS — F32A Depression, unspecified: Secondary | ICD-10-CM

## 2024-02-12 DIAGNOSIS — E66811 Obesity, class 1: Secondary | ICD-10-CM | POA: Diagnosis not present

## 2024-02-12 DIAGNOSIS — Z6832 Body mass index (BMI) 32.0-32.9, adult: Secondary | ICD-10-CM

## 2024-02-12 DIAGNOSIS — F419 Anxiety disorder, unspecified: Secondary | ICD-10-CM | POA: Diagnosis not present

## 2024-02-12 NOTE — Progress Notes (Signed)
 Office: [phone]  /  Fax: [phone]  WEIGHT SUMMARY AND BIOMETRICS  Starting Date: 12/25/23  Starting Weight: 232lb   Weight Lost Since Last Visit: 4lb   Vitals Temp: 97.9 F (36.6 C) BP: 124/84 Pulse Rate: 84 SpO2: 97 %   Body Composition  Body Fat %: 43 % Fat Mass (lbs): 97.6 lbs Muscle Mass (lbs): 123 lbs Total Body Water (lbs): 80.8 lbs Visceral Fat Rating : 11   HPI  Chief Complaint: OBESITY  Sandra [name] is here to discuss her progress with her obesity treatment plan. She is on the keeping a food journal and adhering to recommended goals of 1500 calories and 100 protein and states she is following her eating plan approximately 50 % of the time. She states she is exercising 0 minutes 0 times per week.  Interval History:  Since last office visit she is down 4 lb She is up 1.4 lb of muscle mass and is down 5.4 lb of body fat since last visit Her insurance denied coverage for Zepbound Work stress has increased She has cut out SSBs She is in counseling and started CBT with Sharron She has a net weight loss of 5 lb in the past 7 weeks  Pharmacotherapy: none  PHYSICAL EXAM:  Blood pressure 124/84, pulse 84, temperature 97.9 F (36.6 C), height 5' 10 (1.778 m), weight 227 lb (103 kg), SpO2 97%. Body mass index is 32.57 kg/m.  General: She is overweight, cooperative, alert, well developed, and in no acute distress. PSYCH: Has normal mood, affect and thought process.   Lungs: Normal breathing effort, no conversational dyspnea.  ASSESSMENT AND PLAN  TREATMENT PLAN FOR OBESITY:  Recommended Dietary Goals  Sandra [name] is currently in the action stage of change. As such, her goal is to continue weight management plan. She has agreed to keeping a food journal and adhering to recommended goals of 1500 calories and 90+ g of  protein.  Behavioral Intervention  We discussed the following Behavioral Modification Strategies today: increasing lean protein intake to  established goals, increasing fiber rich foods, increasing water intake , work on tracking and journaling calories using tracking application, keeping healthy foods at home, work on managing stress, creating time for self-care and relaxation, avoiding temptations and identifying enticing environmental cues, and continue to practice mindfulness when eating.  Additional resources provided today: NA  Recommended Physical Activity Goals  Sandra [name] has been advised to work up to 150 minutes of moderate intensity aerobic activity a week and strengthening exercises 2-3 times per week for cardiovascular health, weight loss maintenance and preservation of muscle mass.   She has agreed to Start aerobic activity with a goal of 150 minutes a week at moderate intensity.   Pharmacotherapy changes for the treatment of obesity: none  ASSOCIATED CONDITIONS ADDRESSED TODAY  Anxiety and depression Worsening with increased stress at work and lack of a great support system at home.  She is in counseling and started CBT with Dr. Sharron.  This is helping her with mindful eating.  She is on Trintellix  20 mg daily with psychiatry.  Obesity, Class I, BMI 30-34.9 Improving Work on calorie tracking, self-care, stress reduction, mindful eating, minimizing sugar intake and using exercise for stress reduction.  BMI 32.0-32.9,adult  Hepatic steatosis  Fibrosis 4 Score = 1.06 (Low risk)        Interpretation for patients with NAFLD          <1.30       -  F0-F1 (Low risk)  1.30-2.67 -  Indeterminate           >2.67      -  F3-F4 (High risk)     Validated for ages 77-65 Continue active plan for body fat reduction for treatment of hepatic steatosis.  Aim for 10% total body weight loss.           She was informed of the importance of frequent follow up visits to maximize her success with intensive lifestyle modifications for her multiple health conditions.   ATTESTASTION STATEMENTS:  Reviewed by clinician on  day of visit: allergies, medications, problem list, medical history, surgical history, family history, social history, and previous encounter notes pertinent to obesity diagnosis.   I have personally spent 24 minutes total time today in preparation, patient care, nutritional counseling and education,  and documentation for this visit, including the following: review of most recent clinical lab tests, reviewing medical assistant documentation, review and interpretation of bioimpedence results.     Darice Haddock, D.O. DABFM, DABOM [facility] [address] [place], [place] [address] [phone]

## 2024-02-12 NOTE — Patient Instructions (Signed)
 Try to track calories : 1500 cal/ day goal  Stay as active as you can Hydrate well with water  Continue counseling Stress reduction Mindfulness

## 2024-02-17 ENCOUNTER — Telehealth (INDEPENDENT_AMBULATORY_CARE_PROVIDER_SITE_OTHER): Admitting: Psychology

## 2024-02-17 DIAGNOSIS — F419 Anxiety disorder, unspecified: Secondary | ICD-10-CM

## 2024-02-17 DIAGNOSIS — F32A Depression, unspecified: Secondary | ICD-10-CM | POA: Diagnosis not present

## 2024-02-17 DIAGNOSIS — F5089 Other specified eating disorder: Secondary | ICD-10-CM

## 2024-02-17 NOTE — Progress Notes (Signed)
  Office: [phone]  /  Fax: [phone]    Date: February 17, 2024  Appointment Start Time: 3:30pm Duration: 32 minutes Provider: Wyatt Fire, Psy.D. Type of Session: Individual Therapy  Location of Patient: Parked in car (address obtained; private location) Location of Provider: [facility] at [facility] Type of Contact: Telepsychological Visit via MyChart Video Visit  Session Content: Sandra [name] is a 58 y.o. female presenting for a follow-up appointment to address the previously established treatment goal of increasing coping skills. Today's appointment was a telepsychological visit. [name] provided verbal consent for today's telepsychological appointment and she is aware she is responsible for securing confidentiality on her end of the session. Prior to proceeding with today's appointment, [name]'s physical location at the time of this appointment was obtained as well a phone number she could be reached at in the event of technical difficulties. [name] and this provider participated in today's telepsychological service.   This provider conducted a brief check-in. [name] reported, Work is still stressful.  She shared about her recent appointment with Dr. Waylan. She further shared instances of decreased appetite and engagement in emotional eating behaviors. Reviewed emotional and physical hunger. Explored recent examples of increased awareness and identified what factors helped her with the abovementioned. Discussed the impact of cortisol on eating habits and briefly discussed stress management. Moreover, this provider shared she will be leaving the practice mid-December. [name]'s thoughts/feelings were explored and processed.  Overall, [place] was receptive to today's appointment as evidenced by openness to sharing and responsiveness to feedback.  Mental Status Examination:  Appearance: neat Behavior: appropriate to circumstances Mood: neutral Affect: mood congruent Speech: WNL Eye Contact:  appropriate Psychomotor Activity: WNL Gait: unable to assess Thought Process: linear, logical, and goal directed and no evidence or endorsement of suicidal, homicidal, and self-harm ideation, plan and intent  Thought Content/Perception: no hallucinations, delusions, bizarre thinking or behavior endorsed or observed Orientation: AAOx4 Memory/Concentration: intact Insight: fair Judgment: fair  Interventions:  Conducted a brief chart review Provided empathic reflections and validation Reviewed content from the previous session Provided positive reinforcement Employed supportive psychotherapy interventions to facilitate reduced distress and to improve coping skills with identified stressors Engaged patient in problem solving Discussed termination   DSM-5 Diagnosis(es): F50.89 Other Specified Feeding or Eating Disorder, Emotional Eating Behaviors, F41.9 Unspecified Anxiety Disorder and  F32.A Unspecified Depressive Disorder  Treatment Goal & Progress: During the initial appointment with this provider, the following treatment goal was established: increase coping skills. Progress is limited, as [name] has just begun treatment with this provider; however, she is receptive to the interaction and interventions and rapport is being established.   Plan: The next appointment is scheduled for 02/24/2024 at 12pm, which will be via MyChart Video Visit. The next session will focus on working towards the established treatment goal. This provider and [name] will continue processing this provider's departure from the practice. [name] will continue with her primary therapist.    Wyatt Fire, PsyD

## 2024-02-25 DIAGNOSIS — F330 Major depressive disorder, recurrent, mild: Secondary | ICD-10-CM | POA: Diagnosis not present

## 2024-03-15 DIAGNOSIS — F330 Major depressive disorder, recurrent, mild: Secondary | ICD-10-CM | POA: Diagnosis not present

## 2024-03-16 ENCOUNTER — Ambulatory Visit: Payer: Self-pay

## 2024-03-16 NOTE — Telephone Encounter (Signed)
 FYI- Patient has appointment 03/17/24

## 2024-03-16 NOTE — Telephone Encounter (Signed)
 FYI Only or Action Required?: FYI only for provider: appointment scheduled on 03/17/24.  Patient was last seen in primary care on 02/12/2024 by Waylan Darice BRAVO, DO.  Called Nurse Triage reporting Sore Throat.  Symptoms began several days ago.  Interventions attempted: OTC medications: Dayquil, Nyquil.  Symptoms are: stable.  Triage Disposition: See PCP When Office is Open (Within 3 Days)  Patient/caregiver understands and will follow disposition?: Yes Reason for Disposition  [1] Sore throat with cough/cold symptoms AND [2] present > 5 days  Answer Assessment - Initial Assessment Questions Patient has taken Nyquil and Dayquil, mild relief.   1. ONSET: When did the throat start hurting? (Hours or days ago)      3 days ago  2. SEVERITY: How bad is the sore throat? (Scale 1-10; mild, moderate or severe)     7-8/10  3.  VIRAL SYMPTOMS: Are there any symptoms of a cold, such as a runny nose, cough, hoarse voice or red eyes?      Cough, nasal congestion  4. FEVER: Do you have a fever? If Yes, ask: What is your temperature, how was it measured, and when did it start?     99.5-100  5. PUS ON THE TONSILS: Is there pus on the tonsils in the back of your throat?     Denies  6. OTHER SYMPTOMS: Do you have any other symptoms? (e.g., difficulty breathing, headache, rash)     Denies  Protocols used: Sore Throat-A-AH  Copied from CRM #[id number]. Topic: Clinical - Red Word Triage >> Mar 16, 2024 11:33 AM Rea ORN wrote: Red Word that prompted transfer to Nurse Triage: sore throat, coughing with clear mucus, nasal congestion for the past 3 days

## 2024-03-17 ENCOUNTER — Ambulatory Visit: Admitting: Family Medicine

## 2024-03-17 ENCOUNTER — Telehealth (HOSPITAL_COMMUNITY): Admitting: Psychiatry

## 2024-03-17 ENCOUNTER — Encounter: Payer: Self-pay | Admitting: Family Medicine

## 2024-03-17 ENCOUNTER — Encounter (HOSPITAL_COMMUNITY): Payer: Self-pay | Admitting: Psychiatry

## 2024-03-17 VITALS — BP 118/78 | HR 76 | Temp 98.9°F | Ht 70.0 in | Wt 227.5 lb

## 2024-03-17 VITALS — Wt 227.0 lb

## 2024-03-17 DIAGNOSIS — F419 Anxiety disorder, unspecified: Secondary | ICD-10-CM | POA: Diagnosis not present

## 2024-03-17 DIAGNOSIS — J029 Acute pharyngitis, unspecified: Secondary | ICD-10-CM | POA: Diagnosis not present

## 2024-03-17 DIAGNOSIS — F330 Major depressive disorder, recurrent, mild: Secondary | ICD-10-CM | POA: Diagnosis not present

## 2024-03-17 DIAGNOSIS — J069 Acute upper respiratory infection, unspecified: Secondary | ICD-10-CM | POA: Diagnosis not present

## 2024-03-17 LAB — POCT INFLUENZA A/B
Influenza A, POC: NEGATIVE
Influenza B, POC: NEGATIVE

## 2024-03-17 LAB — POC COVID19 BINAXNOW: SARS Coronavirus 2 Ag: NEGATIVE

## 2024-03-17 LAB — POCT RAPID STREP A (OFFICE): Rapid Strep A Screen: NEGATIVE

## 2024-03-17 MED ORDER — ARIPIPRAZOLE 2 MG PO TABS: 2.0000 mg | ORAL_TABLET | Freq: Every day | ORAL | 1 refills | Status: AC

## 2024-03-17 MED ORDER — VORTIOXETINE HBR 20 MG PO TABS: ORAL_TABLET | ORAL | 0 refills | Status: AC

## 2024-03-17 NOTE — Progress Notes (Signed)
 [facility] MD Virtual Progress Note   Patient Location: In Car Provider Location: Home Office  I connect with patient by video and verified that I am speaking with correct person by using two identifiers. I discussed the limitations of evaluation and management by telemedicine and the availability of in person appointments. I also discussed with the patient that there may be a patient responsible charge related to this service. The patient expressed understanding and agreed to proceed.  Sandra [name] [id number] 58 y.o.  03/17/2024 11:08 AM  History of Present Illness:  Patient is evaluated by video session.  She is in the car.  She reported chronic anxiety depression related to her job.  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  Her plan is to start looking for a job in 2026.  She also feels very tired, exhausted and unmotivated to do things.  Today she had a visit with primary care to do testing for COVID, flu or strep.  However these results are negative.  She is seeing her therapist Rosaline and now reading book about employment stress.  Patient reported a lot of changes in the company for past few months and now morel is very down.  She reported stress and sometime crying spells, hopelessness but denies any active or passive suicidal thoughts.  We have recommended take melatonin but she does not feel needed because sleep is somewhat better.  She also had not started hormone replacement therapy because of the past history of breast cancer.  She like to try something on top of Trintellix  to help her depression.  In the past she had tried Lamictal, Paxil , Lexapro, Prozac , Effexor .  Wellbutrin made her more depressed.  So far tolerating Trintellix  and denies any tremor or shakes or any EPS.  She denies any hallucination, paranoia or any aggression.  She did not go to [place]  to see her father but plan is to visit him this  Christmas.  Past Psychiatric History: [facility]  Tried Paxil , Lexapro, ativan  and trazadone but don't remember.  Lamictal did not help and Wellbutrin caused depression.  We tried Effexor  and Prozac  but did not help.  No h/o psychosis, suicidal attempt or Inpatient.     Past Medical History:  Diagnosis Date   Anxiety    Breast cancer (HCC) 11/2017   rt.Br.   Depression    Depression    Endometriosis, diagnosis via laparoscopy    Family history of breast cancer    Family history of breast cancer    Family history of colon cancer    Family history of prostate cancer    Hyperlipidemia 2017   Lichen sclerosus, vulva    Low serum vitamin D  2017   Personal history of chemotherapy    Personal history of radiation therapy    SOB (shortness of breath)    Vitamin D  deficiency     Outpatient Encounter Medications as of 03/17/2024  Medication Sig   betamethasone  valerate ointment (VALISONE ) 0.1 % APPLY TWICE A DAY FOR 1-2 WEEKS AS NEEDED FOR FLARE OF LICHEN SCLEROSIS.   clobetasol  ointment (TEMOVATE ) 0.05 % Apply twice a day to affected area for 2 weeks then twice weekly   clotrimazole  (LOTRIMIN ) 1 % cream APPLY TO AFFECTED AREA TWICE A DAY   Fluticasone-Umeclidin-Vilant (TRELEGY ELLIPTA ) 100-62.5-25 MCG/ACT AEPB Inhale 1 puff into the lungs daily.   Fluticasone-Umeclidin-Vilant (TRELEGY ELLIPTA ) 100-62.5-25 MCG/ACT AEPB Inhale 1 puff  into the lungs daily in the afternoon.   letrozole  (FEMARA ) 2.5 MG tablet TAKE HALF A TABLET BY MOUTH DAILY.   OVER THE COUNTER MEDICATION Take 3 capsules by mouth daily. Probiome complex   vortioxetine  HBr (TRINTELLIX ) 20 MG TABS tablet Take one tab daily   No facility-administered encounter medications on file as of 03/17/2024.    Recent Results (from the past 2160 hours)  Insulin , random     Status: None   Collection Time: 12/25/23  9:37 AM  Result Value Ref Range   INSULIN  14.2 2.6 - 24.9 uIU/mL  Folate     Status: None    Collection Time: 12/25/23  9:37 AM  Result Value Ref Range   Folate 18.1 >3.0 ng/mL    Comment: A serum folate concentration of less than 3.1 ng/mL is considered to represent clinical deficiency.   POC Influenza A/B     Status: None   Collection Time: 03/17/24  8:41 AM  Result Value Ref Range   Influenza A, POC Negative Negative   Influenza B, POC Negative Negative  POC COVID-19     Status: None   Collection Time: 03/17/24  8:41 AM  Result Value Ref Range   SARS Coronavirus 2 Ag Negative Negative  POC Rapid Strep A     Status: None   Collection Time: 03/17/24  8:42 AM  Result Value Ref Range   Rapid Strep A Screen Negative Negative     Psychiatric Specialty Exam: Physical Exam  Review of Systems  Weight 227 lb (103 kg).There is no height or weight on file to calculate BMI.  General Appearance: Casual  Eye Contact:  Fair  Speech:  Slow  Volume:  Decreased  Mood:  Anxious and Depressed  Affect:  Constricted and Depressed  Thought Process:  Descriptions of Associations: Intact  Orientation:  Full (Time, Place, and Person)  Thought Content:  Rumination  Suicidal Thoughts:  No  Homicidal Thoughts:  No  Memory:  Immediate;   Good Recent;   Good Remote;   Fair  Judgement:  Intact  Insight:  Present  Psychomotor Activity:  Decreased  Concentration:  Concentration: Good and Attention Span: Good  Recall:  Good  Fund of Knowledge:  Good  Language:  Good  Akathisia:  No  Handed:  Right  AIMS (if indicated):     Assets:  Communication Skills Desire for Improvement Housing Social Support Talents/Skills Transportation  ADL's:  Intact  Cognition:  WNL  Sleep: Better       01/29/2023    9:08 AM 11/12/2022    2:05 PM 02/19/2021   12:28 PM 09/27/2020    8:41 AM 09/19/2020    9:37 AM  Depression screen PHQ 2/9  Decreased Interest 1 1 2 1 1   Down, Depressed, Hopeless 0 1 2 1 1   PHQ - 2 Score 1 2 4 2 2   Altered sleeping 1 1 0 1 2  Tired, decreased energy 2 2 3 1 1    Change in appetite 3 1 2 1 2   Feeling bad or failure about yourself  1 1 0 0 2  Trouble concentrating 0 0 0 1 1  Moving slowly or fidgety/restless 0 0 1 1 0  Suicidal thoughts 0 0 0 0 0  PHQ-9 Score 8  7  10  7  10    Difficult doing work/chores Not difficult at all Not difficult at all        Data saved with a previous flowsheet row definition  Assessment/Plan: MDD (major depressive disorder), recurrent episode, mild - Plan: vortioxetine  HBr (TRINTELLIX ) 20 MG TABS tablet, ARIPiprazole  (ABILIFY ) 2 MG tablet  Anxiety - Plan: vortioxetine  HBr (TRINTELLIX ) 20 MG TABS tablet, ARIPiprazole  (ABILIFY ) 2 MG tablet  Patient is 58 year old female with history of major depressive disorder, anxiety and history of breast cancer.  Discussed stressors related to her job.  She is now going to look for a job because it has been causing a lot of stress.  Her sleep is better.  She did not require melatonin.  She is taking Trintellix  20 mg.  Since she had tried multiple medication in the past with limited outcome.  I recommend to do GeneSight testing and patient is willing to come tomorrow morning in office for sample.  Recommend trial low-dose Abilify  to augment the Trintellix .  Discussed medication side effects and benefits.  Patient is not interested in hormone replacement therapy due to history of breast cancer.  She still struggle with fatigue, lack of energy.  Labs reviewed.  Negative for COVID, flu and strep.  She is going to talk to her PCP for further discussion.  Discussed possible side effects of Abilify .  Will follow-up in 6 weeks.  Encouraged to continue therapy with Rosaline to help her coping skills.  Recommend to call back if she has any question, concern or if she feel worsening of symptoms.     Follow Up Instructions:     I discussed the assessment and treatment plan with the patient. The patient was provided an opportunity to ask questions and all were answered. The patient agreed with the  plan and demonstrated an understanding of the instructions.   The patient was advised to call back or seek an in-person evaluation if the symptoms worsen or if the condition fails to improve as anticipated.    Collaboration of Care: Other provider involved in patient's care AEB notes are available in epic to review  Patient/Guardian was advised Release of Information must be obtained prior to any record release in order to collaborate their care with an outside provider. Patient/Guardian was advised if they have not already done so to contact the registration department to sign all necessary forms in order for us  to release information regarding their care.   Consent: Patient/Guardian gives verbal consent for treatment and assignment of benefits for services provided during this visit. Patient/Guardian expressed understanding and agreed to proceed.     Total encounter time 27 minutes which includes face-to-face time, chart reviewed, care coordination, order entry and documentation during this encounter.   Note: This document was prepared by [employer] voice dictation technology and any errors that results from this process are unintentional.    Leni ONEIDA Client, MD 03/17/2024

## 2024-03-17 NOTE — Patient Instructions (Signed)
-  It was a pleasure to care for you today. -Rapid strep, covid, and influenza were negative.  -Based on physical exam and length of symptoms, this is a viral upper respiratory infection. Recommend to symptom management over the counter with continuing to take Nyquil/Dayquil, Tylenol /Ibuprofen , and Mucinex. Rest, hydrate.  -If symptoms do not improve by Monday, please send a MyChart message.  -Excuse work note provided for missing work yesterday and to be out the rest of the week.  -I hope you get to feeling better soon!

## 2024-03-17 NOTE — Progress Notes (Signed)
 Established Patient Office Visit   Subjective:  Patient ID: Sandra [name], female    DOB: 1965-08-09  Age: 58 y.o. MRN: [id number]  Chief Complaint  Patient presents with   Sinus Problem    Fever, cough, clear mucus w/blood, sore throat, horse voice x4 days ago tried nyquil and dayquil .    HPI: Patient is experiencing the following symptoms:  Cough-varies between dry cough and thick clear sputum  Nasal congestion Sore throat Fatigue  Low grade fever  Feels like lymph nodes enlarged  Loss of appetite Hoarse   Denies CP, SHOB, HA, dizziness, lightheadedness, ear pain or drainage, body aches, nausea, or vomiting.   Symptoms started 4 days ago. Been around people at work that have been sick.   She reports she has been taking Nyquil or Dayquil for symptoms. Seem to help in the moment. Resting.   ROS See HPI above     Objective:   BP 118/78   Pulse 76   Temp 98.9 F (37.2 C) (Oral)   Ht 5' 10 (1.778 m)   Wt 227 lb 8 oz (103.2 kg)   LMP  (LMP Unknown)   SpO2 96%   BMI 32.64 kg/m    Physical Exam Vitals reviewed.  Constitutional:      General: She is not in acute distress.    Appearance: Normal appearance. She is ill-appearing. She is not toxic-appearing or diaphoretic.  HENT:     Head: Normocephalic and atraumatic.     Right Ear: Tympanic membrane, ear canal and external ear normal. There is no impacted cerumen.     Left Ear: Tympanic membrane, ear canal and external ear normal. There is no impacted cerumen.     Nose: Congestion present.     Right Sinus: No maxillary sinus tenderness or frontal sinus tenderness.     Left Sinus: No maxillary sinus tenderness or frontal sinus tenderness.     Mouth/Throat:     Pharynx: Oropharynx is clear. Uvula midline. No pharyngeal swelling, oropharyngeal exudate, posterior oropharyngeal erythema, uvula swelling or postnasal drip.  Eyes:     General:        Right eye: No discharge.        Left eye: No discharge.      Conjunctiva/sclera: Conjunctivae normal.  Cardiovascular:     Rate and Rhythm: Normal rate and regular rhythm.     Heart sounds: Normal heart sounds. No murmur heard.    No friction rub. No gallop.  Pulmonary:     Effort: Pulmonary effort is normal. No respiratory distress.     Breath sounds: Normal breath sounds.  Musculoskeletal:        General: Normal range of motion.  Lymphadenopathy:     Head:     Right side of head: No submental or submandibular adenopathy.     Left side of head: No submental or submandibular adenopathy.  Skin:    General: Skin is warm and dry.  Neurological:     General: No focal deficit present.     Mental Status: She is alert and oriented to person, place, and time. Mental status is at baseline.     Motor: No weakness.     Gait: Gait normal.  Psychiatric:        Mood and Affect: Mood normal.        Behavior: Behavior normal.        Thought Content: Thought content normal.        Judgment: Judgment normal.  Results for orders placed or performed in visit on 03/17/24  POC Rapid Strep A  Result Value Ref Range   Rapid Strep A Screen Negative Negative  POC Influenza A/B  Result Value Ref Range   Influenza A, POC Negative Negative   Influenza B, POC Negative Negative  POC COVID-19  Result Value Ref Range   SARS Coronavirus 2 Ag Negative Negative    The 10-year ASCVD risk score (Arnett DK, et al., 2019) is: 2.6%    Assessment & Plan:  Viral upper respiratory tract infection  Sore throat -     POCT rapid strep A -     POCT Influenza A/B -     POC COVID-19 BinaxNow  -Rapid strep, covid, and influenza were negative.  -Based on physical exam and length of symptoms, this is a viral upper respiratory infection. Recommend to symptom management over the counter with continuing to take Nyquil/Dayquil, Tylenol /Ibuprofen , and Mucinex. Rest, hydrate.  -If symptoms do not improve by Monday, please send a MyChart message.  -Excuse work note provided  for missing work yesterday and to be out the rest of the week.  Christie Copley, NP

## 2024-04-06 ENCOUNTER — Encounter: Payer: Self-pay | Admitting: Family Medicine

## 2024-04-06 ENCOUNTER — Ambulatory Visit: Admitting: Family Medicine

## 2024-04-06 VITALS — BP 130/74 | HR 73 | Ht 70.0 in | Wt 223.0 lb

## 2024-04-06 DIAGNOSIS — K760 Fatty (change of) liver, not elsewhere classified: Secondary | ICD-10-CM

## 2024-04-06 DIAGNOSIS — E66811 Obesity, class 1: Secondary | ICD-10-CM | POA: Diagnosis not present

## 2024-04-06 DIAGNOSIS — Z6832 Body mass index (BMI) 32.0-32.9, adult: Secondary | ICD-10-CM | POA: Diagnosis not present

## 2024-04-06 DIAGNOSIS — F339 Major depressive disorder, recurrent, unspecified: Secondary | ICD-10-CM | POA: Diagnosis not present

## 2024-04-06 DIAGNOSIS — R7303 Prediabetes: Secondary | ICD-10-CM

## 2024-04-06 NOTE — Progress Notes (Signed)
 "  Office: [phone]  /  Fax: [phone]  WEIGHT SUMMARY AND BIOMETRICS  Starting Date: 12/25/23  Starting Weight: 232lb   Weight Lost Since Last Visit: 4lb   Vitals BP: 130/74 Pulse Rate: 73 SpO2: 97 %   Body Composition  Body Fat %: 42.4 % Fat Mass (lbs): 94.8 lbs Muscle Mass (lbs): 122.4 lbs Total Body Water (lbs): 82.2 lbs Visceral Fat Rating : 11     HPI  Chief Complaint: OBESITY  Sandra [name] is here to discuss her progress with her obesity treatment plan. She is on the keeping a food journal and adhering to recommended goals of 1500 calories and 100 protein and states she is following her eating plan approximately 50 % of the time. She states she is exercising 0 minutes 0 times per week.   Interval History:  Since last office visit she is down 4 lb This gives her a net weight loss of 9 lb in 1.5 mos of medically supervised weight management This is a 3.8% TBW loss She did travel to [place]  and has stayed mindful of food choices She is walking more often (outdoors) She uses an oral device for mild OSA Mood has improved (though is not sleeping well) She is making better food choices Sugar cravings are under better control She is in counseling She lacks insurance coverage for AOMs  Pharmacotherapy: none   PHYSICAL EXAM:  Blood pressure 130/74, pulse 73, height 5' 10 (1.778 m), weight 223 lb (101.2 kg), SpO2 97%. Body mass index is 32 kg/m.  General: She is overweight, cooperative, alert, well developed, and in no acute distress. PSYCH: Has normal mood, affect and thought process.   Lungs: Normal breathing effort, no conversational dyspnea.   ASSESSMENT AND PLAN  TREATMENT PLAN FOR OBESITY:  Recommended Dietary Goals  Sandra [name] is currently in the action stage of change. As such, her goal is to continue weight management plan. She has agreed to the Category 3 Plan and keeping a food journal and adhering to recommended goals of 1500 calories and 90 g of   protein.  Behavioral Intervention  We discussed the following Behavioral Modification Strategies today: increasing lean protein intake to established goals, increasing fiber rich foods, increasing water intake , work on tracking and journaling calories using tracking application, keeping healthy foods at home, work on managing stress, creating time for self-care and relaxation, avoiding temptations and identifying enticing environmental cues, continue to practice mindfulness when eating, and planning for success.  Additional resources provided today: NA  Recommended Physical Activity Goals  Sandra [name] has been advised to work up to 150 minutes of moderate intensity aerobic activity a week and strengthening exercises 2-3 times per week for cardiovascular health, weight loss maintenance and preservation of muscle mass.   She has agreed to Start aerobic activity with a goal of 150 minutes a week at moderate intensity.  Increase walking frequency to 3 days/ wk  Pharmacotherapy changes for the treatment of obesity: none  ASSOCIATED CONDITIONS ADDRESSED TODAY  Hepatic steatosis  Fibrosis 4 Score = 1.06 (Low risk)        Interpretation for patients with NAFLD          <1.30       -  F0-F1 (Low risk)          1.30-2.67 -  Indeterminate           >2.67      -  F3-F4 (High risk)     Validated for ages 65-65 Continue  active plan for weight loss Limit ETOH intake  Wegovy not covered by insurance       Obesity, Class I, BMI 30-34.9 Improving Reviewed bioimpedence with patient High stress levels and depression have been barriers to some of her progress but she is actively working on these  BMI 32.0-32.9,adult  Prediabetes Lab Results  Component Value Date   HGBA1C 5.7 (H) 10/31/2023   Aim for > 5% TBW loss then repeat A1c Continue to limit starch and sugar intake and increase walking time  Depression, recurrent Improving on 3 psychotropic medications per Dr Arfeen and in counseling.  Would  like to follow Dr Sharron at her new practice     She was informed of the importance of frequent follow up visits to maximize her success with intensive lifestyle modifications for her multiple health conditions.   ATTESTASTION STATEMENTS:  Reviewed by clinician on day of visit: allergies, medications, problem list, medical history, surgical history, family history, social history, and previous encounter notes pertinent to obesity diagnosis.   I personally spent a total of 30 minutes in the care of the patient today including preparing to see the patient, performing a medically appropriate exam/evaluation, counseling and educating, referring and communicating with other health care professionals, and documenting clinical information in the EHR.    Sandra [name], D.O. DABFM, DABOM [facility] [address] [place], [place] [address] [phone] "

## 2024-04-07 ENCOUNTER — Ambulatory Visit (HOSPITAL_COMMUNITY)

## 2024-04-07 DIAGNOSIS — F419 Anxiety disorder, unspecified: Secondary | ICD-10-CM

## 2024-04-07 DIAGNOSIS — F330 Major depressive disorder, recurrent, mild: Secondary | ICD-10-CM

## 2024-04-07 NOTE — Progress Notes (Signed)
 Patient arrived today for a Genesight test, she wanted Dr. Curry to know that she notices a stark difference since adding Abilify  - to the good.

## 2024-04-10 ENCOUNTER — Other Ambulatory Visit (HOSPITAL_COMMUNITY): Payer: Self-pay | Admitting: Psychiatry

## 2024-04-10 DIAGNOSIS — F419 Anxiety disorder, unspecified: Secondary | ICD-10-CM

## 2024-04-10 DIAGNOSIS — F330 Major depressive disorder, recurrent, mild: Secondary | ICD-10-CM

## 2024-04-13 ENCOUNTER — Other Ambulatory Visit: Payer: Self-pay | Admitting: Hematology and Oncology

## 2024-04-13 DIAGNOSIS — Z1231 Encounter for screening mammogram for malignant neoplasm of breast: Secondary | ICD-10-CM

## 2024-04-15 NOTE — Addendum Note (Signed)
 Addended by: WAYLAN DARICE BRAVO on: 04/15/2024 07:46 AM   Modules accepted: Orders

## 2024-04-21 ENCOUNTER — Telehealth (HOSPITAL_COMMUNITY): Admitting: Psychiatry

## 2024-05-17 ENCOUNTER — Ambulatory Visit: Admitting: Family Medicine

## 2024-05-18 ENCOUNTER — Ambulatory Visit

## 2024-05-27 ENCOUNTER — Ambulatory Visit: Admitting: Family Medicine

## 2024-06-17 ENCOUNTER — Ambulatory Visit: Admitting: Dermatology

## 2024-12-30 ENCOUNTER — Ambulatory Visit: Admitting: Hematology and Oncology
